# Patient Record
Sex: Male | Born: 1935 | Race: White | Hispanic: No | Marital: Married | Smoking: Former smoker
Health system: Southern US, Community
[De-identification: ages and names within clinical notes are randomized; demographics above are authoritative.]

## PROBLEM LIST (undated history)

## (undated) DIAGNOSIS — I447 Left bundle-branch block, unspecified: Secondary | ICD-10-CM

## (undated) DIAGNOSIS — I679 Cerebrovascular disease, unspecified: Secondary | ICD-10-CM

## (undated) DIAGNOSIS — R55 Syncope and collapse: Secondary | ICD-10-CM

## (undated) DIAGNOSIS — M1990 Unspecified osteoarthritis, unspecified site: Secondary | ICD-10-CM

## (undated) DIAGNOSIS — R0600 Dyspnea, unspecified: Secondary | ICD-10-CM

## (undated) DIAGNOSIS — I6529 Occlusion and stenosis of unspecified carotid artery: Secondary | ICD-10-CM

## (undated) DIAGNOSIS — C61 Malignant neoplasm of prostate: Secondary | ICD-10-CM

## (undated) DIAGNOSIS — I2510 Atherosclerotic heart disease of native coronary artery without angina pectoris: Secondary | ICD-10-CM

## (undated) DIAGNOSIS — I4891 Unspecified atrial fibrillation: Secondary | ICD-10-CM

## (undated) DIAGNOSIS — J8410 Pulmonary fibrosis, unspecified: Secondary | ICD-10-CM

## (undated) DIAGNOSIS — Z950 Presence of cardiac pacemaker: Secondary | ICD-10-CM

## (undated) DIAGNOSIS — N183 Chronic kidney disease, stage 3 unspecified: Secondary | ICD-10-CM

## (undated) DIAGNOSIS — D649 Anemia, unspecified: Secondary | ICD-10-CM

## (undated) DIAGNOSIS — R001 Bradycardia, unspecified: Secondary | ICD-10-CM

## (undated) DIAGNOSIS — I779 Disorder of arteries and arterioles, unspecified: Secondary | ICD-10-CM

## (undated) DIAGNOSIS — E785 Hyperlipidemia, unspecified: Secondary | ICD-10-CM

## (undated) DIAGNOSIS — N189 Chronic kidney disease, unspecified: Secondary | ICD-10-CM

## (undated) HISTORY — DX: Chronic kidney disease, unspecified: N18.9

## (undated) HISTORY — DX: Hyperlipidemia, unspecified: E78.5

## (undated) HISTORY — PX: HERNIA REPAIR: SHX51

## (undated) HISTORY — DX: Unspecified atrial fibrillation: I48.91

## (undated) HISTORY — DX: Malignant neoplasm of prostate: C61

## (undated) HISTORY — DX: Left bundle-branch block, unspecified: I44.7

## (undated) HISTORY — PX: CATARACT EXTRACTION: SUR2

## (undated) HISTORY — DX: Cerebrovascular disease, unspecified: I67.9

## (undated) HISTORY — PX: EYE SURGERY: SHX253

## (undated) HISTORY — DX: Occlusion and stenosis of unspecified carotid artery: I65.29

---

## 2000-03-13 ENCOUNTER — Encounter (INDEPENDENT_AMBULATORY_CARE_PROVIDER_SITE_OTHER): Payer: Self-pay | Admitting: Specialist

## 2000-03-13 ENCOUNTER — Other Ambulatory Visit: Admission: RE | Admit: 2000-03-13 | Discharge: 2000-03-13 | Payer: Self-pay | Admitting: Urology

## 2001-01-04 ENCOUNTER — Encounter: Payer: Self-pay | Admitting: *Deleted

## 2001-01-04 ENCOUNTER — Ambulatory Visit (HOSPITAL_COMMUNITY): Admission: RE | Admit: 2001-01-04 | Discharge: 2001-01-04 | Payer: Self-pay | Admitting: *Deleted

## 2003-01-16 ENCOUNTER — Ambulatory Visit (HOSPITAL_COMMUNITY): Admission: RE | Admit: 2003-01-16 | Discharge: 2003-01-16 | Payer: Self-pay

## 2003-08-05 ENCOUNTER — Ambulatory Visit (HOSPITAL_COMMUNITY): Admission: RE | Admit: 2003-08-05 | Discharge: 2003-08-05 | Payer: Self-pay | Admitting: Internal Medicine

## 2003-12-09 ENCOUNTER — Encounter (HOSPITAL_COMMUNITY): Admission: RE | Admit: 2003-12-09 | Discharge: 2003-12-10 | Payer: Self-pay | Admitting: Internal Medicine

## 2003-12-12 ENCOUNTER — Ambulatory Visit (HOSPITAL_COMMUNITY): Admission: RE | Admit: 2003-12-12 | Discharge: 2003-12-12 | Payer: Self-pay | Admitting: Cardiology

## 2003-12-15 ENCOUNTER — Ambulatory Visit (HOSPITAL_COMMUNITY): Admission: RE | Admit: 2003-12-15 | Discharge: 2003-12-15 | Payer: Self-pay | Admitting: Cardiology

## 2003-12-17 ENCOUNTER — Inpatient Hospital Stay (HOSPITAL_BASED_OUTPATIENT_CLINIC_OR_DEPARTMENT_OTHER): Admission: RE | Admit: 2003-12-17 | Discharge: 2003-12-17 | Payer: Self-pay | Admitting: Cardiology

## 2004-06-16 ENCOUNTER — Ambulatory Visit: Payer: Self-pay | Admitting: Cardiology

## 2004-06-18 ENCOUNTER — Ambulatory Visit (HOSPITAL_COMMUNITY): Admission: RE | Admit: 2004-06-18 | Discharge: 2004-06-18 | Payer: Self-pay | Admitting: Cardiology

## 2004-06-18 ENCOUNTER — Ambulatory Visit: Payer: Self-pay | Admitting: Cardiology

## 2004-09-06 ENCOUNTER — Ambulatory Visit (HOSPITAL_COMMUNITY): Admission: RE | Admit: 2004-09-06 | Discharge: 2004-09-06 | Payer: Self-pay | Admitting: Internal Medicine

## 2004-09-06 ENCOUNTER — Ambulatory Visit: Payer: Self-pay | Admitting: Internal Medicine

## 2004-09-21 ENCOUNTER — Ambulatory Visit: Payer: Self-pay | Admitting: Cardiology

## 2006-01-24 ENCOUNTER — Ambulatory Visit (HOSPITAL_COMMUNITY): Admission: RE | Admit: 2006-01-24 | Discharge: 2006-01-24 | Payer: Self-pay | Admitting: Internal Medicine

## 2006-06-05 ENCOUNTER — Ambulatory Visit (HOSPITAL_COMMUNITY): Admission: RE | Admit: 2006-06-05 | Discharge: 2006-06-05 | Payer: Self-pay | Admitting: Internal Medicine

## 2007-02-22 ENCOUNTER — Ambulatory Visit (HOSPITAL_COMMUNITY): Admission: RE | Admit: 2007-02-22 | Discharge: 2007-02-22 | Payer: Self-pay | Admitting: Ophthalmology

## 2008-05-30 ENCOUNTER — Ambulatory Visit (HOSPITAL_COMMUNITY): Admission: RE | Admit: 2008-05-30 | Discharge: 2008-05-30 | Payer: Self-pay | Admitting: Internal Medicine

## 2008-06-04 ENCOUNTER — Ambulatory Visit: Payer: Self-pay | Admitting: Internal Medicine

## 2008-06-27 HISTORY — PX: COLONOSCOPY W/ POLYPECTOMY: SHX1380

## 2008-07-03 ENCOUNTER — Ambulatory Visit: Payer: Self-pay | Admitting: Internal Medicine

## 2008-07-03 ENCOUNTER — Ambulatory Visit (HOSPITAL_COMMUNITY): Admission: RE | Admit: 2008-07-03 | Discharge: 2008-07-03 | Payer: Self-pay | Admitting: Internal Medicine

## 2008-07-03 ENCOUNTER — Encounter: Payer: Self-pay | Admitting: Internal Medicine

## 2008-10-28 ENCOUNTER — Ambulatory Visit: Payer: Self-pay | Admitting: Cardiology

## 2008-10-29 ENCOUNTER — Ambulatory Visit: Payer: Self-pay | Admitting: Cardiology

## 2008-10-29 ENCOUNTER — Ambulatory Visit (HOSPITAL_COMMUNITY): Admission: RE | Admit: 2008-10-29 | Discharge: 2008-10-29 | Payer: Self-pay | Admitting: Cardiology

## 2008-10-29 ENCOUNTER — Encounter: Payer: Self-pay | Admitting: Cardiology

## 2009-02-26 ENCOUNTER — Encounter (INDEPENDENT_AMBULATORY_CARE_PROVIDER_SITE_OTHER): Payer: Self-pay | Admitting: *Deleted

## 2009-02-26 LAB — CONVERTED CEMR LAB
BUN: 32 mg/dL
Calcium: 9.5 mg/dL
Chloride: 109 meq/L
Glucose, Bld: 90 mg/dL
HCT: 35.1 %
HDL: 48 mg/dL
Hemoglobin, Urine: NEGATIVE
LDL Cholesterol: 144 mg/dL
Nitrite: NEGATIVE
Potassium: 4.7 meq/L
Protein, ur: NEGATIVE mg/dL
Specific Gravity, Urine: 1.018
pH: 6

## 2009-03-05 ENCOUNTER — Encounter (INDEPENDENT_AMBULATORY_CARE_PROVIDER_SITE_OTHER): Payer: Self-pay | Admitting: *Deleted

## 2009-06-02 ENCOUNTER — Ambulatory Visit (HOSPITAL_COMMUNITY): Admission: RE | Admit: 2009-06-02 | Discharge: 2009-06-02 | Payer: Self-pay | Admitting: Internal Medicine

## 2010-05-13 ENCOUNTER — Ambulatory Visit (HOSPITAL_COMMUNITY)
Admission: RE | Admit: 2010-05-13 | Discharge: 2010-05-13 | Payer: Self-pay | Source: Home / Self Care | Admitting: Ophthalmology

## 2010-06-10 ENCOUNTER — Ambulatory Visit (HOSPITAL_COMMUNITY)
Admission: RE | Admit: 2010-06-10 | Discharge: 2010-06-10 | Payer: Self-pay | Source: Home / Self Care | Attending: Internal Medicine | Admitting: Internal Medicine

## 2010-06-29 ENCOUNTER — Ambulatory Visit: Admit: 2010-06-29 | Payer: Self-pay | Admitting: Vascular Surgery

## 2010-06-29 ENCOUNTER — Ambulatory Visit
Admission: RE | Admit: 2010-06-29 | Discharge: 2010-06-29 | Payer: Self-pay | Source: Home / Self Care | Attending: Vascular Surgery | Admitting: Vascular Surgery

## 2010-11-09 NOTE — Consult Note (Signed)
NEW PATIENT CONSULTATION   RHET, RORKE T  DOB:  1927/08/02                                       06/29/2010  ZOXWR#:[id number]   Patient presents today for evaluation of asymptomatic left internal  carotid artery stenosis.  He is an active 77 year old gentleman who has  had serial ultrasounds done in [place] for asymptomatic carotid  disease.  He denies any prior amaurosis fugax, transient ischemic  attack, or stroke.  He does have a long history of smoking history but  quit approximately 40 years ago.  He denies any known coronary artery  disease.  Does have a cardiomyopathy which is treated medically.  He  does also have hyperlipidemia.   SOCIAL HISTORY:  He is married.  He does not smoke.  Does have a history  of alcoholism.   FAMILY HISTORY:  Negative for premature atherosclerotic disease.   REVIEW OF SYSTEMS:  No weight loss or gain.  He weighs 180 pounds.  He  is 6 feet 1 inches tall.  CARDIAC:  Positive for chest tightness and pressure.  NEUROLOGIC:  Negative.  PULMONARY:  COPD, otherwise review of systems is negative except for  HPI.   PHYSICAL EXAMINATION:  A well-developed and well-nourished white male  appearing his stated age in no acute distress.  Blood pressure is  145/74, pulse 56, respirations 16.  He is in no acute distress.  HEENT:  Normal.  CHEST:  Clear bilaterally without rales, rhonchi, or wheezes.  HEART:  Regular rate and rhythm without murmur.  I do not appreciate  carotid bruits in his right or left carotid artery.  His radial pulses  are 2+.  He has 2+ dorsalis pedis pulses.  Musculoskeletal shows no  major deformity or cyanosis.  Neurologic:  No focal weakness or  paresthesias.  Skin without ulcers or rashes.   I reviewed his carotid duplex from [facility].  Also, we repeated his  left carotid duplex today in our office to determine if he was a  candidate for surgery based on duplex alone.  We did see a somewhat less  level of predicted stenosis than was seen at the [facility] study.  The  highest internal carotid artery systolic velocity we were able to obtain  was 226 cm/s versus prediction of 369 at [facility].  Also, his end-  diastolic velocities were elevated slightly at 61 in our study.   I discussed this with patient.  I would not recommend any treatment  based on his asymptomatic moderate to severe stenosis.  I have recommend  that we see him at 43-month intervals with serial ultrasounds to rule out  any progression.  I did explain the symptoms of carotid disease with  him, at which time he will notify us immediately.  Otherwise we will see  him in 6 months for a repeat carotid duplex.     Larina Earthly, M.D.  Electronically Signed   [name]/MEDQ  D:  06/29/2010  T:  06/29/2010  Job:  [id number]   cc:   Kingsley Callander. Ouida Sills, MD

## 2010-11-09 NOTE — Procedures (Signed)
CAROTID DUPLEX EXAM   INDICATION:  Carotid disease, per standing order.   HISTORY:  Diabetes:  No.  Cardiac:  No.  Hypertension:  No.  Smoking:  Previous.  Previous Surgery:  No.  CV History:  Occasional dizziness.  Amaurosis Fugax No, Paresthesias No, Hemiparesis No                                       RIGHT             LEFT  Brachial systolic pressure:         126               128  Brachial Doppler waveforms:         Normal            Normal  Vertebral direction of flow:                          Antegrade  DUPLEX VELOCITIES (cm/sec)  CCA peak systolic                                     75  ECA peak systolic                                     75  ICA peak systolic                                     226  ICA end diastolic                                     61  PLAQUE MORPHOLOGY:                                    Mixed  PLAQUE AMOUNT:                                        Moderate/severe  PLAQUE LOCATION:                                      ICA/ECA   IMPRESSION:  Doppler velocities suggest a 60-79% stenosis of the left  proximal internal carotid artery.   ___________________________________________  Larina Earthly, M.D.   CH/MEDQ  D:  06/30/2010  T:  06/30/2010  Job:  [id number]

## 2010-11-09 NOTE — Op Note (Signed)
NAME:  Max Cole, Max Cole               ACCOUNT NO.:  [id number]   MEDICAL RECORD NO.:  [id number]          PATIENT TYPE:  AMB   LOCATION:  DAY                           FACILITY:  [facility]   PHYSICIAN:  R. Roetta Sessions, M.D. DATE OF BIRTH:  1928-04-10   DATE OF PROCEDURE:  DATE OF DISCHARGE:                               OPERATIVE REPORT   Colonoscopy with snare polypectomy and polyp ablation.   INDICATIONS FOR PROCEDURE:  A 77 year old gentleman with a history of  multiple colonic polyps a colonoscopy previously.  Last colonoscopy was  in 2006.  Colonoscopy is now being done as surveillance maneuver.  Risks, benefits, alternatives, limitations have been reviewed.  Questions answered.  Please see the documentation in the medical record.   PROCEDURE NOTE:  O2 saturation, blood pressure, pulse, respirations were  monitored throughout the entire procedure.   CONSCIOUS SEDATION:  Versed 5 mg IV and Demerol 100 mg IV in divided  doses.   INSTRUMENT:  Pentax video chip system.   FINDINGS:  Digital rectal exam revealed no abnormalities.  Endoscopic  findings:  The prep was adequate.  Colon:  Colonic mucosa was surveyed  from the rectosigmoid junction through the left transverse, right colon,  appendiceal orifice, ileocecal valve, and cecum.  These structures were  well seen and photographed for the record.  The patient had multiple  colonic polyps.  The patient had pancolonic diverticula.  From the level  of the cecum, ileocecal valve, all previously mentioned surfaces were  again seen.  The patient had mid descending colon polyps and hepatic  flexure polyps.  Polyps were upwards of 8 to 10 mm in dimension.  There  was a small pedunculated polyp in the mid descending colon which was  cold snared, slightly larger polyp with a broader based.  Stalk was hot  snared.  Polyps at the hepatic flexure were also removed with snare.  Adjacent diminutive polyps were ablated with the tip of the hot  snare  cautery unit.  Two diminutive cecal polyps were ablated with the tip of  the hot snare cautery unit.  The remainder of colonic mucosa appeared  normal.  Scope was pulled down the rectum.  A thorough examination of  the rectal mucosa including retroflexion of the anal verge demonstrated  no abnormalities aside from anal papillae.  The patient tolerated the  procedure well, was reactive to Endoscopy.   IMPRESSION:  1. Anal papillae, otherwise normal rectum.  2. Pancolonic diverticula.  Multiple colonic polyps in the descending      and hepatic flexure segments.  There are diminutive polyps in the      cecum as well.  These were either hot snare ablated with cold or      hot snare removed.   RECOMMENDATIONS:  1. No aspirin or arthritis medications for the next 5 days.  2. Followup on path.  Diverticulosis and polyp literature provided to      [name].  3. Further recommendations to follow.      [name], M.D.  Electronically Signed     RMR/MEDQ  D:  07/03/2008  T:  07/03/2008  Job:  [id number]

## 2010-11-09 NOTE — H&P (Signed)
Max Cole               ACCOUNT NO.:  [id number]   MEDICAL RECORD NO.:  [id number]          PATIENT TYPE:  AMB   LOCATION:  DAY                           FACILITY:  [facility]   PHYSICIAN:  R. Roetta Sessions, M.D. DATE OF BIRTH:  09-Mar-1928   DATE OF ADMISSION:  DATE OF DISCHARGE:  LH                              HISTORY & PHYSICAL   CHIEF COMPLAINT:  Time for surveillance colonoscopy.  History of colon  polyps.   HISTORY OF PRESENT ILLNESS:  Max Cole is a pleasant 77 year old  gentleman who presents today to schedule his surveillance colonoscopy.  He has a history of multiple adenomatous colonic polyps.  His last  colonoscopy was in March 2006, at which time, he had 6 polyps snared and  the few more coagulated.  Biopsies revealed adenomatous polyps in the  transverse colon.  He had a few scattered diverticula.  Dr. Karilyn Cota  performed a procedure and recommended that he come back in 3 years.  The  patient presents today to continue his care with our facility.  He  requested Dr. Jena Gauss to perform this procedure.  He has been doing well.  He denies any blood in the stool or melena.  Bowel movements are  regular.  No nausea, vomiting, heartburn, dysphagia, odynophagia, or  weight loss.   CURRENT MEDICATIONS:  1. Altace 10 mg daily.  2. Metoprolol 25 mg daily.  3. Aspirin 81 mg daily.  4. Centrum Silver daily.  5. Omega-3 occasionally.   ALLERGIES:  No known drug allergies.   PAST MEDICAL HISTORY:  He has a history of diminished LVEF back in 2005,  when he had echocardiogram and cardiac catheterization.  He states with  decrease in alcohol consumption, his left ventricular ejection fraction  improved up to 40%.  He states that one of his valves is little  thickened.  He denies a history of hypertension, palpitations,  hyperlipidemia, and diabetes mellitus.  He has had a cardiac  catheterization with minimal nonobstructive coronary artery disease.  He  has had bilateral  inguinal hernia repair.   FAMILY HISTORY:  Mother and father both deceased due to old age, age 16  and 33 respectively.  No family history of colorectal cancer.   SOCIAL HISTORY:  He is married.  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  He is a nonsmoker, history of prior use.  He continues to  consume alcohol about twice a week consisting of vodka and wine.   REVIEW OF SYSTEMS:  GI:  As per HPI.  CONSTITUTIONAL:  No weight loss.  CARDIOPULMONARY:  No chest pain, shortness of breath, palpitations or  cough.  GENITOURINARY:  No dysuria or hematuria.   PHYSICAL EXAMINATION:  VITAL SIGNS:  Weight 191, height 6 feet 1 inches,  temp 97.8, blood pressure 130/70, and pulse 64.  GENERAL:  Pleasant, thin, Caucasian gentleman in no acute distress.  SKIN:  Warm and dry.  No jaundice.  HEENT:  Sclerae nonicteric. Oropharyngeal mucosa moist and pink.  No  lesions, erythema, or exudates. No lymphadenopathy or thyromegaly.  CHEST:  Lungs  are clear to auscultation.  CARDIAC:  Regular rate and rhythm.  Normal S1 and S2.  No murmurs, rubs,  or gallops.  ABDOMEN:  Positive bowel sounds.  Abdomen is soft, nontender, and  nondistended.  No organomegaly or masses.  No rebound or guarding.  No  abdominal bruits or hernia.  LOWER EXTREMITIES:  No edema.   IMPRESSION:  The patient is 77 year old gentleman who presents for  further evaluation of history of multiple adenomatous polyps.  He is due  for surveillance colonoscopy.   PLAN:  1. Colonoscopy in near future with Dr. Jena Gauss.  I have discussed risk,      alternatives, and benefits with regards to, but not limited to the      risk, reaction to medication, bleeding, infection, perforation, and      he is agreeable to proceed.  2. Hold aspirin 4 days prior to procedure.  3. Please note the patient requests for minimal sedation as he      generally likes to watch his procedure.      Max Cole, P.AJonathon Bellows, M.D.   Electronically Signed    LL/MEDQ  D:  06/04/2008  T:  06/05/2008  Job:  [id number]   cc:   Kingsley Callander. Ouida Sills, MD  Fax: [phone]

## 2010-11-09 NOTE — Letter (Signed)
Oct 28, 2008    Kingsley Callander. Ouida Sills, MD  [address]  [place], [place] [address]   RE:  Max Cole, Max Cole  MRN:  [id number]  /  DOB:  [birth year]   Dear Channing Mutters,   It was my pleasure evaluating Max Cole in the office today at your  request for history of cardiomyopathy.  I last saw this nice gentleman  in 2006 and then, unfortunately, we lost track of him.  As you know, he  has done very well since that time with good exercise tolerance and  generally good health.  He has known cerebrovascular disease, but a  recent ultrasound showed no progression since December 2005.  He has not  been hospitalized nor required treatment in the emergency department.  He underwent an elective right cataract excision without difficulty.  He  has not had an echocardiogram performed since 2005.  He has had chronic  exertional substernal pressure with no significant coronary artery  disease at catheterization in June 2005.  He is able to exercise pretty  much without limit in the morning, but develops discomfort in the  evenings with moderate exertion.  He has known chronic left bundle  branch block.  He has continued to drink some alcohol, but moderated his  intake substantially when his cardiomyopathy was discovered.  His  ejection fraction was initially 0.25 by echo, but increased to 0.40 six  months later.   CURRENT MEDICATIONS:  Aspirin 81 mg daily, Ramipril 10 mg daily.  He  also takes metoprolol 25 mg p.r.n. when he experiences tachycardia.  He  was not able to take beta-blockers continuously in the past due to  bradycardia.   He has no known allergies.  Other aspects of his past medical, social,  and family history were explored and are unchanged.   PHYSICAL EXAMINATION:  GENERAL:  Tall, proportionate, healthy-appearing  gentleman in no acute distress.  The weight is 186, unchanged from 2005.  VITAL SIGNS:  Blood pressure 130/65, heart rate 65 and regular,  respirations 14 and unlabored.  NECK:   No jugular venous distention; faint bilateral bruits.  LUNGS:  Clear.  CARDIAC:  Normal first and second heart sounds; grade 1-2/6 basilar  systolic ejection murmur.  ABDOMEN:  Soft and nontender; no bruits; no organomegaly.  EXTREMITIES:  No edema; distal pulses intact.  SKIN:  No significant abnormalities.  PSYCHIATRIC:  Alert and oriented.  NEUROLOGIC:  Symmetric strength and tone; normal cranial nerves.   Records were obtained from your office and reviewed.  EKG in August  2009, continued to show left bundle-branch block and sinus rhythm; a  single PAC was recorded.  The chemistry profile shows mild renal  insufficiency with a creatinine of 1.6.   IMPRESSION:  Max Cole is doing superbly.  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  Hopefully, his left ventricle is now perfectly normal.  An echocardiogram will be obtained in an attempt to document that.  Even  if ejection fraction is now normal with treatment, it could be subnormal  where we just stop ramipril.  I would be inclined to continue that  medication indefinitely.  It also may be of some benefit to forestall  progression of renal insufficiency.   Much as you have done in the past, I discussed the benefits of statins  with him.  He is inclined to use diet instead.  He was provided with  information regarding heart healthy  diet.  At age 77, with only minor  coronary disease in the past and nonprogressive cerebrovascular disease,  I do not feel strongly that he needs pharmacologic therapy.   He has erectile dysfunction.  Viagra has been of benefit to him in the  past, but Cialis has not.  We have no samples of the former, but offered  him a prescription.   I will review his echocardiogram.  If LV systolic function is relatively  good, he does not require additional cardiology followup.  If not, I  will plan to see him again to determine whether  additional therapy is  warranted.   Thank you so much for sending this nice gentleman back to see me.    Sincerely,      Gerrit Friends. Dietrich Pates, MD, [facility]  Electronically Signed    RMR/MedQ  DD: 10/28/2008  DT: 10/29/2008  Job #: [id number]

## 2010-11-12 NOTE — Cardiovascular Report (Signed)
NAME:  Max Cole, Max Cole                         ACCOUNT NO.:  [id number]   MEDICAL RECORD NO.:  [id number]                   PATIENT TYPE:  OIB   LOCATION:  6501                                 FACILITY:  [facility]   PHYSICIAN:  Charlies Constable, M.D. [facility]              DATE OF BIRTH:  Jul 21, 1927   DATE OF PROCEDURE:  12/17/2003  DATE OF DISCHARGE:  12/17/2003                              CARDIAC CATHETERIZATION   CLINICAL HISTORY:  [name] is 77 years old and has been quite active  exercising regularly and playing golf regularly.  Recently, he had noticed  some chest heaviness with exertion and he was evaluated with Cardiolite scan  which showed left ventricular dilatation, ejection fraction 48% but no  evidence of ischemia.  He also had an echocardiogram which showed an  estimated ejection fraction of 25%.  He was scheduled for further evaluation  with angiography.   PROCEDURE:  Left heart catheterization was performed percutaneously via the  right femoral artery using arterial sheath and 6 French preformed coronary  catheters. Right heart catheterization was performed percutaneously via the  right femoral vein using medium sheath and Swan-Ganz thermodilution  catheter.  The patient tolerated the procedure well and left the lab in  satisfactory condition.  5 French catheters were used.   RESULTS:  Left main coronary artery:  The left main coronary was free of  significant disease.   Left anterior descending artery:  The left anterior descending artery gave  rise to three diagonal branches and two septal perforators.  There is 50%  ostial stenosis in the first diagonal branch.  The rest of the LAD system  was free of significant disease.  obstruction.   Circumflex artery:  The circumflex artery was a large dominant vessel that  gave rise to three marginal branches, two posterior lateral branches and  posterior descending branch.  These vessels were free of significant  disease.   Right coronary artery:  The right coronary artery was a nondominant vessel  that supplied two right ventricular branches.  These vessels were free of  significant disease.   LEFT VENTRICULOGRAM:  The left ventriculogram performed in the RAO  projection showed global hypokinesis with an estimated ejection fraction of  35-40%.   HEMODYNAMIC DATA:  The right atrial pressure was 6 mean.  The right  ventricular pressure was 27/9.  Pulmonary artery pressure was 27/7 with a  mean of 16.  The pulmonary wedge pressure was 12 mean.  The aortic pressure  was 118/53 with mean of 77.  Left ventricular pressure was 118/12.   CONCLUSIONS:  1. Minimal nonobstructive coronary artery disease with 50% narrowing in the     ostium of the first diagonal branch of the left anterior descending.  2. Left ventricular dysfunction with global hypokinesis with an estimated     ejection fraction of 35-40%.   RECOMMENDATIONS:  The patient appears to have a nonischemic  cardiomyopathy.  The etiology is not clear.  He does have a history of some alcohol and this  is a possibility.  Will caution him against this and continue  treatment with Altace and arrange followup with Dr. Dietrich Pates.  He has  bradycardia so he is probably not a candidate for beta blocker therapy.  His  ECG has left bundle branch block and his symptoms do not respond to medical  treatment he may be a candidate for a bi-V pacer.                                               Charlies Constable, M.D. [facility]    BB/MEDQ  D:  12/17/2003  T:  12/18/2003  Job:  [id number]   cc:   Kingsley Callander. Ouida Sills, M.D.  [address]  [place]  [place] [address]  Fax: [phone]   Herbst Bing, M.D.

## 2010-11-12 NOTE — Procedures (Signed)
NAME:  Max Cole, Max Cole                         ACCOUNT NO.:  [id number]   MEDICAL RECORD NO.:  [id number]                   PATIENT TYPE:  OUT   LOCATION:  RAD                                  FACILITY:  [facility]   PHYSICIAN:  Enfield Bing, M.D.               DATE OF BIRTH:  January 14, 1928   DATE OF PROCEDURE:  12/12/2003  DATE OF DISCHARGE:                                  ECHOCARDIOGRAM   REFERRING PHYSICIAN:  Kingsley Callander. Ouida Sills, M.D. and Monette Bing, M.D.   CLINICAL DATA:  A 76 year old gentleman with chest pain.   M-MODE:  Aorta 3.6, left atrium 3.8, septum 1.7, posterior wall 1.1, LV  diastole 6.1, LV systole 5.4.   1. Technically adequate echocardiographic study.  2. Mild left atrial enlargement; normal right atrium and right ventricle.  3. Mild aortic valve sclerosis; very mild insufficiency; mild annular     calcification.  4. Slight mitral valve thickening; mild annular calcification; mild     regurgitation.  5. Normal tricuspid and pulmonic valve.  6. Mild to moderate left ventricular dilatation; mild hypertrophy, most     notable in the proximal septum.  There is global hypokinesis with     paradoxic septal motion.  Overall LV systolic function severely impaired;     estimated ejection fraction equals 0.25.  7. Normal IVC.      ___________________________________________                                             Bing, M.D.   RR/MEDQ  D:  12/12/2003  T:  12/13/2003  Job:  [id number]   cc:   Kingsley Callander. Ouida Sills, M.D.  [address]  [place]  [place] [address]  Fax: [phone]

## 2010-11-12 NOTE — Op Note (Signed)
NAME:  Max Cole, Max Cole                         ACCOUNT NO.:  [id number]   MEDICAL RECORD NO.:  [id number]                   PATIENT TYPE:  AMB   LOCATION:  DAY                                  FACILITY:  [facility]   PHYSICIAN:  Lionel December, M.D.                 DATE OF BIRTH:  1928-01-11   DATE OF PROCEDURE:  08/05/2003  DATE OF DISCHARGE:                                 OPERATIVE REPORT   PROCEDURE:  Total colonoscopy with polypectomy.   INDICATIONS FOR PROCEDURE:  Max Cole is a 77 year old Caucasian male who is here  for screening colonoscopy.  Family history is negative for colorectal  carcinoma.  The procedure and risks were reviewed with the patient, and  informed consent was obtained.   PREOPERATIVE MEDICATIONS:  Demerol 50 mg IV, Versed 5 mg IV in divided dose.   FINDINGS:  The procedure was performed in the endoscopy suite.  The  patient's vital signs and O2 saturations were monitored during the procedure  and remained stable.  The patient was placed in the left lateral recumbent  position and rectal examination performed.  No abnormality noted on external  or digital exam.  The Olympus videoscope was placed into the rectum and  advanced into the region of the sigmoid colon and beyond.  There was a very  redundant hepatic flexure, but I was able to pass the scope into the cecum  which was identified by the appendiceal orifice and ileocecal valve.  There  were two small polyps at the cecum which were ablated by cold biopsy.  There  were three polyps in the area of the hepatic flexure.  One was snared, and  the two others were also snared after raising them with a submucosal  injection of normal saline.  The larger of the three was over a centimeter  in size and was sessile.  Three small polyps at transverse colon were  coagulated.  Three more small polyps were snared.  Two were small and flat  and raised with a submucosal injection of normal saline.  There was a flat  polyp over 2  cm in size at the proximal sigmoid colon which was snared in  two pieces after raising it with a submucosal injection of normal saline.  There were a few tiny polyps that were not treated.  These were in the  transverse and descending colon.  The mucosa of the rest of the colon was  normal.  The rectal mucosa similarly was normal.  Please note that two large  fragments of this polyp were caught with a basket and were removed.  The  scope was passed again and the rectum reexamined along with the anorectal  junction which was unremarkable.  The endoscope was straightened and  withdrawn.  The patient tolerated the procedure well.   FINAL DIAGNOSES:  1. Multiple colonic polyps.  The largest of these polyps were over 2  cm in     the proximal sigmoid colon which were snared piecemeal after submucosal     injection of normal saline.  2. Six other polyps were snared.  Three were coagulated and two were ablated     by cold biopsy, as described above.   RECOMMENDATIONS:  1. Standard instructions given.  2. I will be contacting the patient with the biopsy results.  He probably     will need another colonoscopy in 6 to 12 months.      ___________________________________________                                            Lionel December, M.D.   NR/MEDQ  D:  08/05/2003  T:  08/05/2003  Job:  [id number]   cc:   Kingsley Callander. Ouida Sills, M.D.  [address]  [place]  [place] [address]  Fax: [phone]

## 2010-11-12 NOTE — Procedures (Signed)
NAME:  EVERETT, EHRLER NO.:  [id number]   MEDICAL RECORD NO.:  [id number]          PATIENT TYPE:  OUT   LOCATION:  RAD                           FACILITY:  [facility]   PHYSICIAN:  Vida Roller, M.D.   DATE OF BIRTH:  12/20/1927   DATE OF PROCEDURE:  DATE OF DISCHARGE:  06/18/2004                                  ECHOCARDIOGRAM   PRIMARY CARE PHYSICIAN:  Kingsley Callander. Ouida Sills, M.D.   TAPE NUMBER:  [id number].   TAPE COUNT:  Is [id number] through [id number].   This is a gentleman with carotid bruits for evaluation.  A previous echo  done, in June 2005, shows depressed LV systolic function at 25% with left  atrial enlargement and mild aortic insufficiency.   TECHNICAL QUALITY:  Today's study was technically adequate.   M-MODE TRACING:  1.  The aorta is 33-mm.  2.  The left atrium is 40-mm.  3.  The septum is 12-mm.  4.  The posterior wall is 12-mm.  5.  Left ventricular diastolic dimension is 54-mm.  6.  Left ventricular systolic dimension is 43-mm.   IMAGING: TWO-DIMENSIONAL AND DOPPLER:  1.  The left ventricle is normal size with mild LVH which is concentric.      There is depressed LV systolic function estimated at 40-45%.  There is      anterior hypokinesis with septal motion consistent with a bundle branch      block.   1.  The right ventricle is normal size with normal systolic function.   1.  Both atria appear to be top normal in size.   1.  The aortic valve is mildly sclerotic with mild aortic insufficiency.  No      stenosis is seen.   1.  The mitral valve is mildly thickened with mild insufficiency.  No      stenosis is seen.   1.  The tricuspid valve is mildly thickened with mild regurgitation.  No      stenosis is seen.   1.  The pulmonic valve is not well seen.   1.  Ascending aorta is not well seen.   1.  There is no pericardial effusion.   1.  The inferior vena cava was not well seen.     Trey Paula   JH/MEDQ  D:  06/22/2004  T:  06/22/2004  Job:  [id number]

## 2010-11-12 NOTE — Op Note (Signed)
NAMEAJMAL, KATHAN               ACCOUNT NO.:  [id number]   MEDICAL RECORD NO.:  [id number]          PATIENT TYPE:  AMB   LOCATION:  DAY                           FACILITY:  [facility]   PHYSICIAN:  Lionel December, M.D.    DATE OF BIRTH:  Oct 13, 1927   DATE OF PROCEDURE:  09/06/2004  DATE OF DISCHARGE:                                 OPERATIVE REPORT   PROCEDURE:  Total colonoscopy with polypectomy.   INDICATION:  Max Cole is a 77 year old Caucasian male who underwent screening  colonoscopy in February 2005 and had multiple polyps removed and/or  coagulated.  It was therefore decided to bring him in at one year rather  than three or five.  He remains free of symptoms.  The procedure risks were  reviewed the patient, and informed consent was obtained.   PREMEDICATION:  Versed 2 mg IV in divided dose.   FINDINGS:  Procedure performed in endoscopy suite.  The patient's vital  signs and O2 saturation were monitored during procedure and remained stable.  The patient was placed in the left lateral recumbent position and rectal  examination performed.  No abnormality noted on external or digital exam.  The Olympus video scope was placed in the rectum and advanced under vision  into sigmoid colon and beyond.  Preparation was satisfactory although he had  a lot of liquid stool, which had to be suctioned out.  Few small scattered  diverticula were noted throughout the colon.  The scope was passed to the  cecum, which was identified by ileocecal valve and appendiceal orifice.  Pictures taken for the record.  As the scope was withdrawn, the colonic  mucosa was carefully examined and multiple polyps were found and treated as  below.  There was a sessile polyp at midtransverse colon, which was after  raising a submucosal injection of normal saline.  There was another small  polyp at splenic flexure, which was coagulated while it was being snared.  Five small polyps were snared from the sigmoid colon and  least two more were  coagulated.  The rectal mucosa was normal.  The scope was retroflexed to  examine anorectal junction, which was unremarkable.  Endoscope was  straightened and withdrawn.  The patient tolerated the procedure well.   FINAL DIAGNOSES:  1.  Multiple colonic polyps.  Six were snared as above and a few more were      coagulated.  2.  Few scattered diverticula throughout the colon.   RECOMMENDATIONS:  1.  Standard instructions given.  2.  I will be contacting the patient with biopsy results. Given today's      findings, will plan to bring him back for repeat colonoscopy in three      years from now.      NR/MEDQ  D:  09/06/2004  T:  09/06/2004  Job:  [id number]   cc:   Kingsley Callander. Ouida Sills, MD  [address]  [place]  [place] [address]  Fax: [phone]

## 2010-11-12 NOTE — Procedures (Signed)
NAMEMarland Kitchen  CALLUM, WOLF NO.:  [id number]   MEDICAL RECORD NO.:  [id number]                   PATIENT TYPE:   LOCATION:                                       FACILITY:   PHYSICIAN:  Kingsley Callander. Ouida Sills, M.D.                  DATE OF BIRTH:   DATE OF PROCEDURE:  12/09/2003  DATE OF DISCHARGE:                                    STRESS TEST   SUMMARY:  Mr. Carll underwent a Cardiolite stress test to evaluate recent  symptoms of exertional chest tightness.  He exercised 9 minutes (completing  stage 3 of the Bruce protocol) attaining a maximal heart rate of 158 (107%  of the age-predicted maximal heart rate) at a work load of 10.1 METs and  discontinued exercise due to chest tightness.  He began experiencing chest  tightness during stage 1.  His tightness resolved during recovery.  There  were infrequent atrial premature complexes. His baseline EKG revealed normal  sinus rhythm with a left bundle-branch block.  With exercise he developed 3  mm downsloping ST segment depression with marked T wave inversions in the  inferolateral leads.  There was a peak blood pressure response of 202/80.   IMPRESSION:  Nondiagnostic exercise stress test due to an underlying left  bundle-branch block.  There were definite ST segment and T wave changes,  though, with exercise.  Cardiolite images are pending.      ___________________________________________                                            Kingsley Callander. Ouida Sills, M.D.   ROF/MEDQ  D:  12/09/2003  T:  12/09/2003  Job:  [id number]

## 2010-12-30 ENCOUNTER — Other Ambulatory Visit (INDEPENDENT_AMBULATORY_CARE_PROVIDER_SITE_OTHER): Payer: Medicare Other

## 2010-12-30 DIAGNOSIS — I6529 Occlusion and stenosis of unspecified carotid artery: Secondary | ICD-10-CM

## 2011-01-12 NOTE — Procedures (Unsigned)
CAROTID DUPLEX EXAM  INDICATION:  Follow up known carotid disease.  HISTORY: Diabetes:  No. Cardiac:  No. Hypertension:  No. Smoking:  Previous. Previous Surgery:  No. CV History: Amaurosis Fugax No, Paresthesias No, Hemiparesis No.                                      RIGHT             LEFT Brachial systolic pressure:         114               122 Brachial Doppler waveforms:         WNL               WNL Vertebral direction of flow:        Antegrade         Antegrade DUPLEX VELOCITIES (cm/sec) CCA peak systolic                   83                92 ECA peak systolic                   78                82 ICA peak systolic                   80                274 ICA end diastolic                   24                86 PLAQUE MORPHOLOGY:                  Calcific          Calcific PLAQUE AMOUNT:                      Mild              Moderate-to-severe PLAQUE LOCATION:                    ICA               ICA  IMPRESSION: 1. 60% to 79% left internal carotid artery stenosis. 2. 1% to 39% right internal carotid artery stenosis. 3. Stable left internal carotid artery compared to previous     examination. 4. Bilateral vertebral arteries are within normal limits.  ___________________________________________ Larina Earthly, M.D.  LT/MEDQ  D:  12/30/2010  T:  12/30/2010  Job:  [id number]

## 2011-04-08 LAB — BASIC METABOLIC PANEL
CO2: 34 — ABNORMAL HIGH
GFR calc Af Amer: >60
GFR calc non Af Amer: 56 — ABNORMAL LOW
Glucose, Bld: 85
Potassium: 5.1
Sodium: 139

## 2011-04-08 LAB — HEMOGLOBIN AND HEMATOCRIT, BLOOD: HCT: 36.1 — ABNORMAL LOW

## 2011-06-14 ENCOUNTER — Encounter: Payer: Self-pay | Admitting: Internal Medicine

## 2011-07-15 ENCOUNTER — Other Ambulatory Visit: Payer: Medicare Other

## 2011-09-02 ENCOUNTER — Other Ambulatory Visit (INDEPENDENT_AMBULATORY_CARE_PROVIDER_SITE_OTHER): Payer: Medicare Other | Admitting: *Deleted

## 2011-09-02 DIAGNOSIS — I6529 Occlusion and stenosis of unspecified carotid artery: Secondary | ICD-10-CM

## 2011-09-12 ENCOUNTER — Other Ambulatory Visit: Payer: Self-pay | Admitting: *Deleted

## 2011-09-12 MED ORDER — RAMIPRIL 10 MG PO TABS: 10.0000 mg | ORAL_TABLET | Freq: Every day | ORAL | Status: DC

## 2011-09-20 ENCOUNTER — Other Ambulatory Visit: Payer: Self-pay | Admitting: *Deleted

## 2011-09-20 DIAGNOSIS — I6529 Occlusion and stenosis of unspecified carotid artery: Secondary | ICD-10-CM

## 2011-09-21 ENCOUNTER — Encounter: Payer: Self-pay | Admitting: Vascular Surgery

## 2011-09-21 NOTE — Procedures (Unsigned)
CAROTID DUPLEX EXAM  INDICATION:  Carotid disease.  HISTORY: Diabetes:  No. Cardiac:  No. Hypertension:  No. Smoking:  Previous. Previous Surgery:  No.  CV History:  Currently asymptomatic Amaurosis Fugax No, Paresthesias No, Hemiparesis No                                      RIGHT             LEFT Brachial systolic pressure:         116               122 Brachial Doppler waveforms:         Normal            Normal Vertebral direction of flow:        Antegrade         Antegrade DUPLEX VELOCITIES (cm/sec) CCA peak systolic                   101               89 ECA peak systolic                   115               110 ICA peak systolic                   75                267 ICA end diastolic                   23                79 PLAQUE MORPHOLOGY:                  Mixed             Mixed PLAQUE AMOUNT:                      Mild              Moderate/severe PLAQUE LOCATION:                    ICA/ECA           ICA/ECA  IMPRESSION:  Doppler velocity suggests 1 to 39% stenosis of the right internal carotid artery and a high end 60% to 79% stenosis of the left proximal internal carotid artery.  No significant change noted when compared to the previous exam on 12/30/2010.  ___________________________________________ Larina Earthly, M.D.  CH/MEDQ  D:  09/06/2011  T:  09/06/2011  Job:  [id number]

## 2011-09-29 ENCOUNTER — Encounter: Payer: Self-pay | Admitting: Cardiology

## 2011-09-29 ENCOUNTER — Ambulatory Visit: Payer: Medicare Other | Admitting: Cardiology

## 2011-09-29 DIAGNOSIS — N183 Chronic kidney disease, stage 3 (moderate): Secondary | ICD-10-CM

## 2011-09-29 DIAGNOSIS — I447 Left bundle-branch block, unspecified: Secondary | ICD-10-CM | POA: Insufficient documentation

## 2011-09-29 DIAGNOSIS — I679 Cerebrovascular disease, unspecified: Secondary | ICD-10-CM | POA: Insufficient documentation

## 2011-09-29 DIAGNOSIS — I428 Other cardiomyopathies: Secondary | ICD-10-CM | POA: Insufficient documentation

## 2011-10-14 ENCOUNTER — Ambulatory Visit (INDEPENDENT_AMBULATORY_CARE_PROVIDER_SITE_OTHER): Payer: Medicare Other | Admitting: Cardiology

## 2011-10-14 ENCOUNTER — Encounter: Payer: Self-pay | Admitting: *Deleted

## 2011-10-14 ENCOUNTER — Encounter: Payer: Self-pay | Admitting: Cardiology

## 2011-10-14 VITALS — BP 127/69 | HR 61 | Resp 16 | Ht 73.0 in | Wt 183.0 lb

## 2011-10-14 DIAGNOSIS — I2510 Atherosclerotic heart disease of native coronary artery without angina pectoris: Secondary | ICD-10-CM

## 2011-10-14 DIAGNOSIS — I679 Cerebrovascular disease, unspecified: Secondary | ICD-10-CM

## 2011-10-14 DIAGNOSIS — Z0189 Encounter for other specified special examinations: Secondary | ICD-10-CM

## 2011-10-14 DIAGNOSIS — I428 Other cardiomyopathies: Secondary | ICD-10-CM

## 2011-10-14 DIAGNOSIS — E785 Hyperlipidemia, unspecified: Secondary | ICD-10-CM

## 2011-10-14 DIAGNOSIS — N189 Chronic kidney disease, unspecified: Secondary | ICD-10-CM

## 2011-10-14 MED ORDER — PRAVASTATIN SODIUM 40 MG PO TABS: 40.0000 mg | ORAL_TABLET | Freq: Every day | ORAL | Status: DC

## 2011-10-14 NOTE — Progress Notes (Signed)
Name: Max Cole    DOB: 1928-05-16  Age: 78 y.o.  MR#: [id number]       PCP:  Carylon Perches, MD, MD      Insurance: @PAYORNAME @   CC:    Chief Complaint  Patient presents with  . Appointment    no complaints    VS BP 127/69  Pulse 61  Resp 16  Ht 6\' 1"  (1.854 m)  Wt 183 lb (83.008 kg)  BMI 24.14 kg/m2  Weights Current Weight  10/14/11 183 lb (83.008 kg)    Blood Pressure  BP Readings from Last 3 Encounters:  10/14/11 127/69     Admit date:  (Not on file) Last encounter with RMR:  09/29/2011   Allergy No Known Allergies  Current Outpatient Prescriptions  Medication Sig Dispense Refill  . aspirin 81 MG tablet Take 81 mg by mouth daily.      . metoprolol succinate (TOPROL-XL) 50 MG 24 hr tablet Take 25 mg by mouth daily. Take with or immediately following a meal.      . Omega-3 Fatty Acids (FISH OIL PO) Take by mouth.      . ramipril (ALTACE) 10 MG tablet Take 1 tablet (10 mg total) by mouth daily.  90 tablet  3    Discontinued Meds:   There are no discontinued medications.  Patient Active Problem List  Diagnoses  . Cerebrovascular disease  . Left bundle branch block  . Cardiomyopathy  . Chronic kidney disease    LABS No visits with results within 3 Month(s) from this visit. Latest known visit with results is:  [facility] Conversion Encounter on 02/26/2009  Component Date Value  . WBC 02/26/2009 6.6   . Hemoglobin 02/26/2009 11.5   . HCT 02/26/2009 35.1   . MCV 02/26/2009 96.4   . Platelets 02/26/2009 300   . Cholesterol 02/26/2009 205   . Triglycerides 02/26/2009 67   . HDL 02/26/2009 48   . LDL Cholesterol 02/26/2009 144   . Sodium 02/26/2009 143   . Potassium 02/26/2009 4.7   . Chloride 02/26/2009 109   . CO2 02/26/2009 24   . Glucose, Bld 02/26/2009 90   . BUN 02/26/2009 32   . Creatinine, Ser 02/26/2009 1.83   . Alkaline Phosphatase 02/26/2009 46   . AST 02/26/2009 16   . ALT 02/26/2009 12   . Total Protein 02/26/2009 6.9   . Albumin 02/26/2009  4.1   . Calcium 02/26/2009 9.5   . Specific Gravity, Urine 02/26/2009 1.018   . pH 02/26/2009 6.0   . Urine Glucose 02/26/2009 neg   . Hemoglobin, Urine 02/26/2009 neg   . Protein, ur 02/26/2009 neg   . Nitrite 02/26/2009 neg   . WBC number, urine, micro* 02/26/2009 none seen   . RBC / HPF 02/26/2009 nine seen   . Bacteria, UA 02/26/2009 none seen      Results for this Opt Visit:     Results for orders placed in visit on 02/26/09  CONVERTED [facility] LAB      Component Value Range   WBC 6.6     Hemoglobin 11.5     HCT 35.1     MCV 96.4     Platelets 300     Cholesterol 205     Triglycerides 67     HDL 48     LDL Cholesterol 144     Sodium 143     Potassium 4.7     Chloride 109     CO2 24  Glucose, Bld 90     BUN 32     Creatinine, Ser 1.83     Alkaline Phosphatase 46     AST 16     ALT 12     Total Protein 6.9     Albumin 4.1     Calcium 9.5     Specific Gravity, Urine 1.018     pH 6.0     Urine Glucose neg     Hemoglobin, Urine neg     Protein, ur neg     Nitrite neg     WBC number, urine, microscopy none seen     RBC / HPF nine seen     Bacteria, UA none seen      EKG No orders found for this or any previous visit.   Prior Assessment and Plan Problem List as of 10/14/2011          Cardiology Problems   Cerebrovascular disease   Left bundle branch block   Cardiomyopathy     Other   Chronic kidney disease       Imaging: No results found.   FRS Calculation: Score not calculated

## 2011-10-14 NOTE — Patient Instructions (Signed)
Your physician recommends that you schedule a follow-up appointment in: 1 YEAR  Your physician recommends that you return for lab work in: CHOLESTEROL IN 6 MONTHS  Your physician has recommended you make the following change in your medication:  1 - START PRAVACHOL 40 MG DAILY

## 2011-10-15 ENCOUNTER — Encounter: Payer: Self-pay | Admitting: Cardiology

## 2011-10-15 DIAGNOSIS — E785 Hyperlipidemia, unspecified: Secondary | ICD-10-CM

## 2011-10-15 DIAGNOSIS — Z0189 Encounter for other specified special examinations: Secondary | ICD-10-CM | POA: Insufficient documentation

## 2011-10-15 HISTORY — DX: Hyperlipidemia, unspecified: E78.5

## 2011-10-15 NOTE — Assessment & Plan Note (Signed)
Moderate to severe cerebrovascular disease with a moderate to severe left internal carotid artery stenosis.  In the presence of known vascular disease, treatment of mild to moderate hyperlipidemia would be appropriate.

## 2011-10-15 NOTE — Assessment & Plan Note (Signed)
Patient continues to do extremely well despite long-standing severe cardiomyopathy.  ACE Inhibitor therapy has been maintained.  Treatment with beta blocker has been stopped due to bradycardia.  Aldactone was not required in the absence of symptoms.

## 2011-10-15 NOTE — Assessment & Plan Note (Signed)
Moderate hyperlipidemia has been present for some time, but patient has refused treatment with statins.  I. Once again raised the issue with him, and he now agrees to accept that therapy.  Pravastatin will be started at a dose of 40 mg per day with a repeat lipid profile in 2 months.

## 2011-10-15 NOTE — Assessment & Plan Note (Signed)
Renal dysfunction has been progressive over the past 3 years.  Continued monitoring is appropriate, and referral to a nephrologist should be considered.

## 2011-10-15 NOTE — Progress Notes (Signed)
Patient ID: Max Cole, male   DOB: February 19, 1928, 78 y.o.   MRN: [id number]  HPI: Patient returns to the office after having been lost to followup for the past 3 years.  Interval records obtained and reviewed.  He has a history of cardiomyopathy, perhaps related to excessive alcohol use, but has been essentially free of symptoms.  Ejection fraction was last measured at 25% in 2010.  He remains active including exercising at a local gym and denies all cardiopulmonary symptoms.  He is able to walk a few miles without difficulty.  He is followed by [facility] for moderate to severe cerebrovascular disease.  He continues to consume approximately 5-10 ounces of alcohol per week.  He has not been hospitalized nor required urgent medical care within the past few years.  Prior to Admission medications   Medication Sig Start Date End Date Taking? Authorizing Provider  aspirin 81 MG tablet Take 81 mg by mouth daily.   Yes Historical Provider, MD  metoprolol succinate (TOPROL-XL) 50 MG 24 hr tablet Take 25 mg by mouth daily. Take with or immediately following a meal.   Yes Historical Provider, MD  Omega-3 Fatty Acids (FISH OIL PO) Take by mouth.   Yes Historical Provider, MD  ramipril (ALTACE) 10 MG tablet Take 1 tablet (10 mg total) by mouth daily. 09/12/11 09/11/12 Yes Kathlen Brunswick, MD  pravastatin (PRAVACHOL) 40 MG tablet Take 1 tablet (40 mg total) by mouth daily. 10/14/11 10/13/12  Kathlen Brunswick, MD   No Known Allergies    Past medical history, social history, and family history reviewed and updated.  ROS: Denies orthopnea, PND, chest discomfort, exertional dyspnea, palpitations, lightheadedness or syncope.  All other systems reviewed and are negative.  PHYSICAL EXAM: BP 127/69  Pulse 61  Resp 16  Ht 6\' 1"  (1.854 m)  Wt 83.008 kg (183 lb)  BMI 24.14 kg/m2   General-Well developed; no acute distress Body habitus-proportionate weight and height Neck-No JVD; left carotid bruit Lungs-clear lung  fields; resonant to percussion Cardiovascular-normal PMI; normal S1; paradoxic splitting of S2; modest basilar systolic ejection murmur Abdomen-normal bowel sounds; soft and non-tender without masses or organomegaly Musculoskeletal-No deformities, no cyanosis or clubbing Neurologic-Normal cranial nerves; symmetric strength and tone Skin-Warm, no significant lesions Extremities-distal pulses intact; no edema  EKG: Sinus bradycardia at a rate of 59 bpm; left bundle branch block.  No previous tracing.  ASSESSMENT AND PLAN:  [place] Bing, MD 10/15/2011 4:12 PM

## 2011-12-14 ENCOUNTER — Other Ambulatory Visit: Payer: Self-pay | Admitting: *Deleted

## 2011-12-14 MED ORDER — RAMIPRIL 10 MG PO TABS: 10.0000 mg | ORAL_TABLET | Freq: Every day | ORAL | Status: DC

## 2012-03-01 ENCOUNTER — Encounter: Payer: Self-pay | Admitting: Neurosurgery

## 2012-03-02 ENCOUNTER — Encounter: Payer: Self-pay | Admitting: Neurosurgery

## 2012-03-02 ENCOUNTER — Ambulatory Visit (INDEPENDENT_AMBULATORY_CARE_PROVIDER_SITE_OTHER): Payer: Medicare Other | Admitting: Neurosurgery

## 2012-03-02 ENCOUNTER — Other Ambulatory Visit (INDEPENDENT_AMBULATORY_CARE_PROVIDER_SITE_OTHER): Payer: Medicare Other | Admitting: *Deleted

## 2012-03-02 VITALS — BP 115/58 | HR 55 | Resp 14 | Ht 73.0 in | Wt 180.3 lb

## 2012-03-02 DIAGNOSIS — I6529 Occlusion and stenosis of unspecified carotid artery: Secondary | ICD-10-CM

## 2012-03-02 NOTE — Addendum Note (Signed)
Addended by: Sharee Pimple on: 03/02/2012 02:43 PM   Modules accepted: Orders

## 2012-03-02 NOTE — Progress Notes (Signed)
[facility] Carotid Office Note  CC: Six-month carotid surveillance Referring Physician: Early  History of Present Illness: 78 year old male patient of Dr. Arbie Cookey followed for known bilateral carotid stenosis. The patient denies any signs or symptoms of CVA, TIA, amaurosis fugax or any neural deficit. The patient denies any new medical diagnoses recent surgery.  Past Medical History  Diagnosis Date  . Cerebrovascular disease     Carotid ultrasound in 12/2010-60-79% left internal carotid artery, less than 40% or right RICA, no change  . Left bundle branch block   . 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  EF of 0.25 in 11/2003, 40-45% in 05/2004 and 25% in 10/2008 by echo  . Chronic kidney disease     Creatinine of 1.6 in 2009  . Hyperlipidemia 10/15/2011    Lipid profile in 03/2009:231, 136, 56, 148    ROS: [x]  Positive   [ ]  Denies    General: [ ]  Weight loss, [ ]  Fever, [ ]  chills Neurologic: [ ]  Dizziness, [ ]  Blackouts, [ ]  Seizure [ ]  Stroke, [ ]  "Mini stroke", [ ]  Slurred speech, [ ]  Temporary blindness; [ ]  weakness in arms or legs, [ ]  Hoarseness Cardiac: [ ]  Chest pain/pressure, [ ]  Shortness of breath at rest [ ]  Shortness of breath with exertion, [ ]  Atrial fibrillation or irregular heartbeat Vascular: [ ]  Pain in legs with walking, [ ]  Pain in legs at rest, [ ]  Pain in legs at night,  [ ]  Non-healing ulcer, [ ]  Blood clot in vein/DVT,   Pulmonary: [ ]  Home oxygen, [ ]  Productive cough, [ ]  Coughing up blood, [ ]  Asthma,  [ ]  Wheezing Musculoskeletal:  [ ]  Arthritis, [ ]  Low back pain, [ ]  Joint pain Hematologic: [ ]  Easy Bruising, [ ]  Anemia; [ ]  Hepatitis Gastrointestinal: [ ]  Blood in stool, [ ]  Gastroesophageal Reflux/heartburn, [ ]  Trouble swallowing Urinary: [ ]  chronic Kidney disease, [ ]  on HD - [ ]  MWF or [ ]  TTHS, [ ]  Burning with urination, [ ]  Difficulty urinating Skin: [ ]   Rashes, [ ]  Wounds Psychological: [ ]  Anxiety, [ ]  Depression   Social History History  Substance Use Topics  . Smoking status: Former Smoker -- 1.0 packs/day for 30 years    Quit date: 09/28/1973  . Smokeless tobacco: Not on file  . Alcohol Use: No     History of excessive alcohol use    Family History History reviewed. No pertinent family history.  No Known Allergies  Current Outpatient Prescriptions  Medication Sig Dispense Refill  . aspirin 81 MG tablet Take 81 mg by mouth daily.      . metoprolol (LOPRESSOR) 50 MG tablet 25 mg daily.      . metoprolol succinate (TOPROL-XL) 50 MG 24 hr tablet Take 25 mg by mouth daily. Take with or immediately following a meal.      . pravastatin (PRAVACHOL) 40 MG tablet Take 1 tablet (40 mg total) by mouth daily.  30 tablet  12  . ramipril (ALTACE) 10 MG tablet Take 2.5 mg by mouth daily.      Marland Kitchen DISCONTD: ramipril (ALTACE) 10 MG tablet Take 1 tablet (10 mg total) by mouth daily.  90 tablet  3  . Omega-3 Fatty Acids (FISH OIL PO) Take by mouth.        Physical Examination  Filed Vitals:   03/02/12 1419  BP: 115/58  Pulse: 55  Resp:     Body mass index is 23.79 kg/(m^2).  General:  WDWN in NAD Gait: Normal HEENT: WNL Eyes: Pupils equal Pulmonary: normal non-labored breathing , without Rales, rhonchi,  wheezing Cardiac: RRR, without  Murmurs, rubs or gallops; Abdomen: soft, NT, no masses Skin: no rashes, ulcers noted  Vascular Exam Pulses: 2+ radial pulses Carotid bruits: Carotid pulse to auscultation on the right, bruit heard on the left Extremities without ischemic changes, no Gangrene , no cellulitis; no open wounds;  Musculoskeletal: no muscle wasting or atrophy   Neurologic: A&O X 3; Appropriate Affect ; SENSATION: normal; MOTOR FUNCTION:  moving all extremities equally. Speech is fluent/normal  Non-Invasive Vascular Imaging CAROTID DUPLEX 03/02/2012  Right ICA 20 - 39 % stenosis Left ICA 60 - 79 %  stenosis   ASSESSMENT/PLAN: Asymptomatic patient with advanced carotid stenosis and in the left ICA which is unchanged from previous exam. The patient will followup in 6 months for repeat carotid duplex. The patient knows the signs and symptoms of CVA and knows to report to the nearest emergency department should that occur. The patient's questions were encouraged and answered, he is in agreement with this plan.  Lauree Chandler ANP   Clinic MD: Imogene Burn

## 2012-04-13 ENCOUNTER — Other Ambulatory Visit: Payer: Self-pay | Admitting: *Deleted

## 2012-04-13 DIAGNOSIS — E782 Mixed hyperlipidemia: Secondary | ICD-10-CM

## 2012-04-26 ENCOUNTER — Encounter (INDEPENDENT_AMBULATORY_CARE_PROVIDER_SITE_OTHER): Payer: Self-pay | Admitting: *Deleted

## 2012-04-27 ENCOUNTER — Encounter: Payer: Self-pay | Admitting: *Deleted

## 2012-05-11 ENCOUNTER — Encounter: Payer: Self-pay | Admitting: Cardiology

## 2012-07-04 ENCOUNTER — Other Ambulatory Visit (INDEPENDENT_AMBULATORY_CARE_PROVIDER_SITE_OTHER): Payer: Self-pay | Admitting: *Deleted

## 2012-07-04 ENCOUNTER — Telehealth (INDEPENDENT_AMBULATORY_CARE_PROVIDER_SITE_OTHER): Payer: Self-pay | Admitting: *Deleted

## 2012-07-04 ENCOUNTER — Encounter (INDEPENDENT_AMBULATORY_CARE_PROVIDER_SITE_OTHER): Payer: Self-pay | Admitting: *Deleted

## 2012-07-04 DIAGNOSIS — Z1211 Encounter for screening for malignant neoplasm of colon: Secondary | ICD-10-CM

## 2012-07-04 DIAGNOSIS — Z86010 Personal history of colonic polyps: Secondary | ICD-10-CM

## 2012-07-04 MED ORDER — PEG-KCL-NACL-NASULF-NA ASC-C 100 G PO SOLR: 1.0000 | Freq: Once | ORAL | Status: DC

## 2012-07-04 NOTE — Telephone Encounter (Signed)
Patient needs movi prep 

## 2012-07-18 ENCOUNTER — Telehealth (INDEPENDENT_AMBULATORY_CARE_PROVIDER_SITE_OTHER): Payer: Self-pay | Admitting: *Deleted

## 2012-07-18 NOTE — Telephone Encounter (Signed)
  Procedure: tcs  Reason/Indication:  Hx polyps  Has patient had this procedure before?  Yes, 2010 (EPIC)  If so, when, by whom and where?    Is there a family history of colon cancer?  no  Who?  What age when diagnosed?    Is patient diabetic?   no      Does patient have prosthetic heart valve?  no  Do you have a pacemaker?  no  Has patient had joint replacement within last 12 months?  no  Is patient on Coumadin, Plavix and/or Aspirin? yes  Medications: asa 81 mg daily, metoprolol 50 mg 1/2 tab daily, ramipril 2.5 mg daily, pravastatin 40 mg 1/2 tab daily  Allergies: nkda  Medication Adjustment: asa 2 days  Procedure date & time: 08/16/12 at 930

## 2012-07-19 NOTE — Telephone Encounter (Signed)
agree

## 2012-08-06 ENCOUNTER — Encounter (HOSPITAL_COMMUNITY): Payer: Self-pay | Admitting: Pharmacy Technician

## 2012-08-14 ENCOUNTER — Telehealth (INDEPENDENT_AMBULATORY_CARE_PROVIDER_SITE_OTHER): Payer: Self-pay | Admitting: *Deleted

## 2012-08-14 DIAGNOSIS — Z1211 Encounter for screening for malignant neoplasm of colon: Secondary | ICD-10-CM

## 2012-08-14 MED ORDER — PEG-KCL-NACL-NASULF-NA ASC-C 100 G PO SOLR: 1.0000 | Freq: Once | ORAL | Status: DC

## 2012-08-14 NOTE — Telephone Encounter (Signed)
Please resend, patient states pharmacy didn't get it -- he is scheduled for this thursday

## 2012-08-16 ENCOUNTER — Encounter (HOSPITAL_COMMUNITY)
Admission: RE | Disposition: A | Discharge status: Home Health | Payer: Self-pay | Source: Ambulatory Visit | Attending: Internal Medicine

## 2012-08-16 ENCOUNTER — Encounter (HOSPITAL_COMMUNITY): Payer: Self-pay

## 2012-08-16 ENCOUNTER — Ambulatory Visit (HOSPITAL_COMMUNITY)
Admission: RE | Admit: 2012-08-16 | Discharge: 2012-08-16 | Disposition: A | Discharge status: Home Health | Payer: Medicare Other | Source: Ambulatory Visit | Attending: Internal Medicine | Admitting: Internal Medicine

## 2012-08-16 DIAGNOSIS — K6389 Other specified diseases of intestine: Secondary | ICD-10-CM

## 2012-08-16 DIAGNOSIS — K5730 Diverticulosis of large intestine without perforation or abscess without bleeding: Secondary | ICD-10-CM | POA: Insufficient documentation

## 2012-08-16 DIAGNOSIS — D126 Benign neoplasm of colon, unspecified: Secondary | ICD-10-CM

## 2012-08-16 DIAGNOSIS — Z860100 Personal history of colon polyps, unspecified: Secondary | ICD-10-CM | POA: Insufficient documentation

## 2012-08-16 DIAGNOSIS — E785 Hyperlipidemia, unspecified: Secondary | ICD-10-CM | POA: Insufficient documentation

## 2012-08-16 DIAGNOSIS — K644 Residual hemorrhoidal skin tags: Secondary | ICD-10-CM | POA: Insufficient documentation

## 2012-08-16 DIAGNOSIS — Z09 Encounter for follow-up examination after completed treatment for conditions other than malignant neoplasm: Secondary | ICD-10-CM | POA: Insufficient documentation

## 2012-08-16 HISTORY — PX: COLONOSCOPY: SHX5424

## 2012-08-16 SURGERY — COLONOSCOPY
Anesthesia: Moderate Sedation

## 2012-08-16 MED ORDER — SODIUM CHLORIDE 0.45 % IV SOLN
INTRAVENOUS | Status: DC
  Administered 2012-08-16: 100 mL/h via INTRAVENOUS

## 2012-08-16 MED ORDER — SODIUM CHLORIDE 0.45 % IV SOLN
INTRAVENOUS | Status: DC
  Administered 2012-08-16: 09:00:00 via INTRAVENOUS

## 2012-08-16 MED ORDER — SIMETHICONE 40 MG/0.6ML PO SUSP
ORAL | Status: DC | PRN
  Administered 2012-08-16: 10:00:00

## 2012-08-16 MED ORDER — MEPERIDINE HCL 50 MG/ML IJ SOLN
INTRAMUSCULAR | Status: AC
  Filled 2012-08-16: qty 1

## 2012-08-16 MED ORDER — MEPERIDINE HCL 50 MG/ML IJ SOLN
INTRAMUSCULAR | Status: DC | PRN
  Administered 2012-08-16: 20 mg via INTRAVENOUS

## 2012-08-16 MED ORDER — MIDAZOLAM HCL 5 MG/5ML IJ SOLN
INTRAMUSCULAR | Status: DC | PRN
  Administered 2012-08-16 (×2): 2 mg via INTRAVENOUS

## 2012-08-16 MED ORDER — MIDAZOLAM HCL 5 MG/5ML IJ SOLN
INTRAMUSCULAR | Status: AC
  Filled 2012-08-16: qty 10

## 2012-08-16 NOTE — H&P (Signed)
Max Cole is an 79 y.o. male.   Chief Complaint: Patient is here for colonoscopy. HPI: Patient is an 79 year old Caucasian male with history of multiple colonic polyps who is here for surveillance examination. His last colonoscopy was in 2010. He denies abdominal pain change in his bowel habits or rectal bleeding.  Past Medical History  Diagnosis Date  . Cerebrovascular disease     Carotid ultrasound in 12/2010-60-79% left internal carotid artery, less than 40% or right RICA, no change  . Left bundle branch block   . 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  EF of 0.25 in 11/2003, 40-45% in 05/2004 and 25% in 10/2008 by echo  . Chronic kidney disease     Creatinine of 1.6 in 2009  . Hyperlipidemia 10/15/2011    Lipid profile in 03/2009:231, 136, 56, 148    Past Surgical History  Procedure Laterality Date  . Cataract extraction      Right  . Colonoscopy w/ polypectomy  2010    Diverticulosis    History reviewed. No pertinent family history. Social History:  reports that he quit smoking about 38 years ago. He does not have any smokeless tobacco history on file. He reports that he does not drink alcohol or use illicit drugs.  Allergies: No Known Allergies  Medications Prior to Admission  Medication Sig Dispense Refill  . aspirin 81 MG tablet Take 81 mg by mouth daily.      . metoprolol succinate (TOPROL-XL) 50 MG 24 hr tablet Take 25 mg by mouth daily. Take with or immediately following a meal.      . peg 3350 powder (MOVIPREP) 100 G SOLR Take 1 kit (100 g total) by mouth once.  1 kit  0  . pravastatin (PRAVACHOL) 40 MG tablet Take 20 mg by mouth daily.      . ramipril (ALTACE) 2.5 MG tablet Take 1.25 mg by mouth daily.      . Omega-3 Fatty Acids (FISH OIL PO) Take 1 capsule by mouth daily.         No results found for this or any previous visit (from the past 48 hour(s)). No results found.  ROS  Blood pressure 154/66,  temperature 97.7 F (36.5 C), temperature source Oral, resp. rate 17, height 6\' 1"  (1.854 m), weight 182 lb (82.555 kg). Physical Exam  Constitutional: He appears well-developed and well-nourished.  HENT:  Mouth/Throat: Oropharynx is clear and moist.  Eyes: Conjunctivae are normal. No scleral icterus.  Neck: No thyromegaly present.  Cardiovascular: Normal rate, regular rhythm and normal heart sounds.   No murmur heard. Respiratory: Effort normal and breath sounds normal.  GI: Soft. Bowel sounds are normal. He exhibits no distension and no mass. There is no tenderness.  Musculoskeletal: He exhibits no edema.  Lymphadenopathy:    He has no cervical adenopathy.  Neurological: He is alert.  Skin: Skin is warm and dry.     Assessment/Plan History of colonic adenomas. Surveillance colonoscopy  Max Cole U 08/16/2012, 10:03 AM

## 2012-08-16 NOTE — Op Note (Signed)
COLONOSCOPY PROCEDURE REPORT  PATIENT:  Max Cole  MR#:  [id number] Birthdate:  08-Jul-1927, 79 y.o., male Endoscopist:  Dr. Malissa Hippo, MD Referred By:  Dr. Carylon Perches, MD .Procedure Date: 08/16/2012  Procedure:   Colonoscopy with snare polypectomy.  Indications:  Patient is a [age]-year-old Caucasian male who is in multiple colonic adenomas removed on prior 3 colonoscopies. He is undergoing surveillance colonoscopy.  Informed Consent:  The procedure and risks were reviewed with the patient and informed consent was obtained.  Medications:  Demerol 20 mg IV Versed 4 mg IV  Description of procedure:  After a digital rectal exam was performed, that colonoscope was advanced from the anus through the rectum and colon to the area of the cecum, ileocecal valve and appendiceal orifice. The cecum was deeply intubated. These structures were well-seen and photographed for the record. From the level of the cecum and ileocecal valve, the scope was slowly and cautiously withdrawn. The mucosal surfaces were carefully surveyed utilizing scope tip to flexion to facilitate fold flattening as needed. The scope was pulled down into the rectum where a thorough exam including retroflexion was performed.  Findings:   Prep satisfactory. Few scattered diverticula throughout the colon. Seven small polyps ablated via cold biopsy from cecum and submitted together. These were 2-4 mm in diameter. 6 mm polyp snared from transverse colon. 8 mm polyp snared from sigmoid colon. Polyp from transverse and sigmoid colon were submitted together. Normal rectal mucosa. Small hemorrhoids below the dentate line along with anal papillae.  Therapeutic/Diagnostic Maneuvers Performed:  See above  Complications:  None  Cecal Withdrawal Time:  23 minutes  Impression:  Examination performed to cecum. Seven small polyps ablated via cold biopsy from cecum and submitted together. Two polyps were snared and submitted  together(6 mm polyp at transverse colon and 8 mm polyp at sigmoid colon. Small external hemorrhoids and anal papillae.  Recommendations:  Standard instructions given. I will contact patient with biopsy results and further recommendations.  REHMAN,NAJEEB U  08/16/2012 10:52 AM  CC: Dr. Carylon Perches, MD & Dr. Bonnetta Barry ref. provider found

## 2012-08-20 ENCOUNTER — Encounter (HOSPITAL_COMMUNITY): Payer: Self-pay | Admitting: Internal Medicine

## 2012-08-22 ENCOUNTER — Encounter (INDEPENDENT_AMBULATORY_CARE_PROVIDER_SITE_OTHER): Payer: Self-pay | Admitting: *Deleted

## 2012-08-31 ENCOUNTER — Other Ambulatory Visit: Payer: Medicare Other

## 2012-09-03 ENCOUNTER — Encounter: Payer: Self-pay | Admitting: Neurosurgery

## 2012-09-04 ENCOUNTER — Other Ambulatory Visit: Payer: Self-pay | Admitting: *Deleted

## 2012-09-04 ENCOUNTER — Ambulatory Visit: Payer: Medicare Other | Admitting: Neurosurgery

## 2012-09-04 ENCOUNTER — Other Ambulatory Visit (INDEPENDENT_AMBULATORY_CARE_PROVIDER_SITE_OTHER): Payer: Medicare Other

## 2012-09-04 DIAGNOSIS — I6529 Occlusion and stenosis of unspecified carotid artery: Secondary | ICD-10-CM

## 2012-09-04 DIAGNOSIS — I6523 Occlusion and stenosis of bilateral carotid arteries: Secondary | ICD-10-CM

## 2012-09-10 ENCOUNTER — Other Ambulatory Visit: Payer: Self-pay | Admitting: Vascular Surgery

## 2012-09-10 LAB — CREATININE, SERUM: Creat: 1.68 mg/dL — ABNORMAL HIGH (ref 0.50–1.35)

## 2012-09-11 ENCOUNTER — Other Ambulatory Visit: Payer: Self-pay | Admitting: Dermatology

## 2012-09-17 ENCOUNTER — Encounter: Payer: Self-pay | Admitting: Vascular Surgery

## 2012-09-18 ENCOUNTER — Encounter: Payer: Self-pay | Admitting: Vascular Surgery

## 2012-09-18 ENCOUNTER — Ambulatory Visit (INDEPENDENT_AMBULATORY_CARE_PROVIDER_SITE_OTHER): Payer: Medicare Other | Admitting: Vascular Surgery

## 2012-09-18 ENCOUNTER — Ambulatory Visit
Admission: RE | Admit: 2012-09-18 | Discharge: 2012-09-18 | Disposition: A | Discharge status: Home / Self Care | Payer: Medicare Other | Source: Ambulatory Visit | Attending: Vascular Surgery | Admitting: Vascular Surgery

## 2012-09-18 VITALS — BP 132/54 | HR 51 | Ht 73.0 in | Wt 180.0 lb

## 2012-09-18 DIAGNOSIS — I658 Occlusion and stenosis of other precerebral arteries: Secondary | ICD-10-CM

## 2012-09-18 DIAGNOSIS — I6523 Occlusion and stenosis of bilateral carotid arteries: Secondary | ICD-10-CM

## 2012-09-18 MED ORDER — IOHEXOL 350 MG/ML SOLN: 80.0000 mL | Freq: Once | INTRAVENOUS | Status: DC | PRN

## 2012-09-18 MED ORDER — IOHEXOL 350 MG/ML SOLN
80.0000 mL | Freq: Once | INTRAVENOUS | Status: AC | PRN
  Administered 2012-09-18: 80 mL via INTRAVENOUS

## 2012-09-18 NOTE — Progress Notes (Signed)
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 This is been followed for quite some time with noninvasive studies. He did have a CT angiogram today for further evaluation of this. His most recent duplex in September 2013 should this was at the upper end of the 60-79% stenosis range. He also has been treated for prostate cancer diagnosed for several years and has a followup visit with his urologist later this week. He has no neurologic deficits. He does have occasional orthostatic hypotension with some dizziness but this is infrequent and not severely limiting to him.  Past Medical History  Diagnosis Date  . Cerebrovascular disease     Carotid ultrasound in 12/2010-60-79% left internal carotid artery, less than 40% or right RICA, no change  . Left bundle branch block   . 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  EF of 0.25 in 11/2003, 40-45% in 05/2004 and 25% in 10/2008 by echo  . Chronic kidney disease     Creatinine of 1.6 in 2009  . Hyperlipidemia 10/15/2011    Lipid profile in 03/2009:231, 136, 56, 148  . Carotid artery occlusion     History  Substance Use Topics  . Smoking status: Former Smoker -- 1.00 packs/day for 30 years    Quit date: 09/28/1973  . Smokeless tobacco: Not on file  . Alcohol Use: No     Comment: History of excessive alcohol use    History reviewed. No pertinent family history.  No Known Allergies  Current outpatient prescriptions:metoprolol succinate (TOPROL-XL) 50 MG 24 hr tablet, Take 25 mg by mouth daily. Take with or immediately following a meal., Disp: , Rfl: ;  Omega-3 Fatty Acids (FISH OIL PO), Take 1 capsule by mouth daily. , Disp: , Rfl: ;  pravastatin (PRAVACHOL) 40 MG tablet, Take 20 mg by mouth daily., Disp: , Rfl: ;  ramipril (ALTACE) 2.5 MG tablet, Take 2.5 mg by mouth daily. , Disp: , Rfl:   BP 132/54  Pulse 51  Ht 6\' 1"  (1.854 m)  Wt  180 lb (81.647 kg)  BMI 23.75 kg/m2  SpO2 100%  Body mass index is 23.75 kg/(m^2).   Physical exam: Well-developed well-nourished white male appearing stated age in no acute distress Neurologically he is grossly intact Heart regular rate and rhythm without murmur Respirations nonlabored with equal breath sounds bilaterally Carotid arteries without bruits bilaterally 2+ radial pulses bilaterally  CT scan today was reviewed. This does show severe calcification in his left internal carotid artery at the bifurcation. He has approximately 70% stenosis of his internal carotid artery at the bifurcation.  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 This is no change from his duplex from September 2013. We will continue 6 month interval. He understands that he is just below the threshold where we would recommend endarterectomy for stroke reduction. He'll notify us immediately should he develop any symptoms otherwise be seen in 6 months with repeat carotid duplex

## 2012-09-24 ENCOUNTER — Telehealth: Payer: Self-pay | Admitting: Vascular Surgery

## 2012-09-24 NOTE — Telephone Encounter (Signed)
Max Cole saw Dr. Arbie Cookey 09/18/12, he states the results of his CT scan were not available at the time of his visit on 03/25.  He was told he would get a call once the results were available.   He is returning that call, his wife states the "nurse" didn't leave her name.  Please call him at [phone].

## 2012-09-25 NOTE — Telephone Encounter (Signed)
i called patent yesterday, [name] told him he would review and told me to tell him nothing was different from his appt dictation. Max Cole said he understood.

## 2012-10-17 ENCOUNTER — Ambulatory Visit (INDEPENDENT_AMBULATORY_CARE_PROVIDER_SITE_OTHER): Payer: Medicare Other | Admitting: Cardiology

## 2012-10-17 ENCOUNTER — Encounter: Payer: Self-pay | Admitting: Cardiology

## 2012-10-17 VITALS — BP 122/58 | HR 63 | Ht 73.0 in | Wt 183.8 lb

## 2012-10-17 DIAGNOSIS — I2589 Other forms of chronic ischemic heart disease: Secondary | ICD-10-CM

## 2012-10-17 DIAGNOSIS — I679 Cerebrovascular disease, unspecified: Secondary | ICD-10-CM

## 2012-10-17 DIAGNOSIS — I10 Essential (primary) hypertension: Secondary | ICD-10-CM

## 2012-10-17 DIAGNOSIS — N189 Chronic kidney disease, unspecified: Secondary | ICD-10-CM

## 2012-10-17 DIAGNOSIS — I255 Ischemic cardiomyopathy: Secondary | ICD-10-CM

## 2012-10-17 DIAGNOSIS — I447 Left bundle-branch block, unspecified: Secondary | ICD-10-CM

## 2012-10-17 DIAGNOSIS — E785 Hyperlipidemia, unspecified: Secondary | ICD-10-CM

## 2012-10-17 DIAGNOSIS — I428 Other cardiomyopathies: Secondary | ICD-10-CM

## 2012-10-17 NOTE — Patient Instructions (Addendum)
Your physician recommends that you schedule a follow-up appointment in: ONE YEAR  Your physician has requested that you have an echocardiogram. Echocardiography is a painless test that uses sound waves to create images of your heart. It provides your doctor with information about the size and shape of your heart and how well your heart's chambers and valves are working. This procedure takes approximately one hour. There are no restrictions for this procedure.    

## 2012-10-17 NOTE — Progress Notes (Signed)
Patient ID: Max Cole, male   DOB: September 04, 1927, 79 y.o.   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 He has completely stopped use of hard liquor, but continues to drink some wine, which he could not quantify, reporting 2 or fewer glasses of wine per day at the most.  He is remarkably active, working out at the local fitness center on a daily basis and walking up to 2 miles, playing golf and performing all of his yard work.  He was recently evaluated by Dr. Arbie Cookey for cerebrovascular disease.   Carotid ultrasound suggested a significant left ICA stenosis, verified by CT angiography as >70% left but <50% on the right. Significant C-spine spondylosis was also noted. In the absence of symptoms,  continued periodic screening was advised.  Current Outpatient Prescriptions  Medication Sig Dispense Refill  . aspirin 81 MG tablet Take 81 mg by mouth daily.      . metoprolol succinate (TOPROL-XL) 50 MG 24 hr tablet Take 25 mg by mouth daily. Take with or immediately following a meal.      . ramipril (ALTACE) 2.5 MG tablet Take 2.5 mg by mouth daily.       . metoprolol (LOPRESSOR) 50 MG tablet       . pravastatin (PRAVACHOL) 40 MG tablet Take 20 mg by mouth daily.       No current facility-administered medications for this visit.   No Known Allergies   Past medical history, social history, and family history reviewed and updated.  ROS: Denies chest discomfort, dyspnea, orthopnea, PND, lightheadedness or syncope.  He notes occasional mild palpitations that are markedly improved from a few years ago. He experiences minimal pedal edema that is not troublesome to him. All other systems reviewed and are negative.  PHYSICAL EXAM: BP 122/58  Pulse 63  Ht 6\' 1"  (1.854 m)  Wt 83.371 kg (183 lb 12.8 oz)  BMI 24.25 kg/m2;  Body mass index is 24.25 kg/(m^2). General-Well developed; no acute distress Body  habitus-proportionate weight and height Neck-No JVD; left > right carotid bruit Lungs-clear lung fields; resonant to percussion Cardiovascular-normal PMI; normal S1 and S2; minimal systolic ejection murmur; no third or fourth heart sounds Abdomen-normal bowel sounds; soft and non-tender without masses or organomegaly Musculoskeletal-No deformities, no cyanosis or clubbing Neurologic-Normal cranial nerves; symmetric strength and tone Skin-Warm, tan, no significant lesions Extremities-distal pulses intact; trace edema  EKG: Normal sinus rhythm; left bundle branch block; leftward axis. No previous tracing for comparison.  [place] Bing, MD 10/17/2012  4:21 PM  ASSESSMENT AND PLAN

## 2012-10-17 NOTE — Progress Notes (Deleted)
Name: Max Cole    DOB: 1928/01/31  Age: 79 y.o.  MR#: [id number]       PCP:  Carylon Perches, MD      Insurance: Payor: [employer] MEDICARE  Plan: [employer] MEDICARE COMPLETE  Product Type: *No Product type*    CC:   No chief complaint on file.  LIST  VS Filed Vitals:   10/17/12 1511  BP: 122/58  Pulse: 63  Height: 6\' 1"  (1.854 m)  Weight: 183 lb 12.8 oz (83.371 kg)    Weights Current Weight  10/17/12 183 lb 12.8 oz (83.371 kg)  09/18/12 180 lb (81.647 kg)  08/16/12 182 lb (82.555 kg)    Blood Pressure  BP Readings from Last 3 Encounters:  10/17/12 122/58  09/18/12 132/54  08/16/12 117/60     Admit date:  (Not on file) Last encounter with RMR:  Visit date not found   Allergy Review of patient's allergies indicates no known allergies.  Current Outpatient Prescriptions  Medication Sig Dispense Refill  . aspirin 81 MG tablet Take 81 mg by mouth daily.      . metoprolol succinate (TOPROL-XL) 50 MG 24 hr tablet Take 25 mg by mouth daily. Take with or immediately following a meal.      . ramipril (ALTACE) 2.5 MG tablet Take 2.5 mg by mouth daily.       . metoprolol (LOPRESSOR) 50 MG tablet       . pravastatin (PRAVACHOL) 40 MG tablet Take 20 mg by mouth daily.       No current facility-administered medications for this visit.    Discontinued Meds:    Medications Discontinued During This Encounter  Medication Reason  . Omega-3 Fatty Acids (FISH OIL PO) Error    Patient Active Problem List  Diagnosis  . Cerebrovascular disease  . Left bundle branch block  . Cardiomyopathy  . Chronic kidney disease  . Laboratory test  . Hyperlipidemia  . Occlusion and stenosis of carotid artery without mention of cerebral infarction  . Carotid stenosis    LABS    Component Value Date/Time   NA 143 02/26/2009   NA 139 02/19/2007 1104   K 4.7 02/26/2009   K 5.1 02/19/2007 1104   CL 109 02/26/2009   CL 104 02/19/2007 1104   CO2 24 02/26/2009   CO2 34* 02/19/2007 1104   GLUCOSE 90  02/26/2009   GLUCOSE 85 02/19/2007 1104   BUN 23 09/10/2012 0832   BUN 32 02/26/2009   BUN 16 02/19/2007 1104   CREATININE 1.68* 09/10/2012 0832   CREATININE 1.83 02/26/2009   CREATININE 1.25 02/19/2007 1104   CALCIUM 9.5 02/26/2009   CALCIUM 9.5 02/19/2007 1104   GFRNONAA 56* 02/19/2007 1104   GFRAA  Value: >60        The eGFR has been calculated using the MDRD equation. This calculation has not been validated in all clinical 02/19/2007 1104   CMP     Component Value Date/Time   NA 143 02/26/2009   K 4.7 02/26/2009   CL 109 02/26/2009   CO2 24 02/26/2009   GLUCOSE 90 02/26/2009   BUN 23 09/10/2012 0832   CREATININE 1.68* 09/10/2012 0832   CREATININE 1.83 02/26/2009   CALCIUM 9.5 02/26/2009   PROT 6.9 02/26/2009   ALBUMIN 4.1 02/26/2009   AST 16 02/26/2009   ALT 12 02/26/2009   ALKPHOS 46 02/26/2009   GFRNONAA 56* 02/19/2007 1104   GFRAA  Value: >60  The eGFR has been calculated using the MDRD equation. This calculation has not been validated in all clinical 02/19/2007 1104       Component Value Date/Time   WBC 6.6 02/26/2009   HGB 11.5 02/26/2009   HGB 12.2* 02/19/2007 1104   HCT 35.1 02/26/2009   HCT 36.1* 02/19/2007 1104   MCV 96.4 02/26/2009    Lipid Panel     Component Value Date/Time   CHOL 205 02/26/2009   TRIG 67 02/26/2009   HDL 48 02/26/2009   LDLCALC 161 02/26/2009    ABG No results found for this basename: phart, pco2, pco2art, po2, po2art, hco3, tco2, acidbasedef, o2sat     No results found for this basename: TSH   BNP (last 3 results) No results found for this basename: PROBNP,  in the last 8760 hours Cardiac Panel (last 3 results) No results found for this basename: CKTOTAL, CKMB, TROPONINI, RELINDX,  in the last 72 hours  Iron/TIBC/Ferritin No results found for this basename: iron, tibc, ferritin     EKG Orders placed in visit on 10/17/12  . EKG 12-LEAD     Prior Assessment and Plan Problem List as of 10/17/2012     ICD-9-CM   Cerebrovascular disease   Last Assessment & Plan    10/14/2011 Office Visit Written 10/15/2011  4:28 PM by Kathlen Brunswick, MD     Moderate to severe cerebrovascular disease with a moderate to severe left internal carotid artery stenosis.  In the presence of known vascular disease, treatment of mild to moderate hyperlipidemia would be appropriate.    Left bundle branch block   Cardiomyopathy   Last Assessment & Plan   10/14/2011 Office Visit Written 10/15/2011  4:27 PM by Kathlen Brunswick, MD     Patient continues to do extremely well despite long-standing severe cardiomyopathy.  ACE Inhibitor therapy has been maintained.  Treatment with beta blocker has been stopped due to bradycardia.  Aldactone was not required in the absence of symptoms.    Chronic kidney disease   Last Assessment & Plan   10/14/2011 Office Visit Written 10/15/2011  4:28 PM by Kathlen Brunswick, MD     Renal dysfunction has been progressive over the past 3 years.  Continued monitoring is appropriate, and referral to a nephrologist should be considered.    Laboratory test   Hyperlipidemia   Last Assessment & Plan   10/14/2011 Office Visit Written 10/15/2011  4:29 PM by Kathlen Brunswick, MD     Moderate hyperlipidemia has been present for some time, but patient has refused treatment with statins.  I. Once again raised the issue with him, and he now agrees to accept that therapy.  Pravastatin will be started at a dose of 40 mg per day with a repeat lipid profile in 2 months.    Occlusion and stenosis of carotid artery without mention of cerebral infarction   Carotid stenosis       Imaging: Ct Angio Neck W/cm &/or Wo/cm  09/18/2012  *RADIOLOGY REPORT*  Clinical Data:  Bilateral carotid artery stenosis.  Dizziness.  CT ANGIOGRAPHY NECK  Technique:  Multidetector CT imaging of the neck was performed using the standard protocol during bolus administration of intravenous contrast.  Multiplanar CT image reconstructions including MIPs were obtained to evaluate the vascular anatomy.  Carotid stenosis measurements (when applicable) are obtained utilizing NASCET criteria, using the distal internal carotid diameter as the denominator.  Contrast: 80mL OMNIPAQUE IOHEXOL 350 MG/ML SOLN  Comparison:  Carotid Doppler  ultrasound study at [facility] 06/10/2010.  Findings:  A common origin of the innominate artery and left common carotid artery is evident.  The vertebral arteries both originate from the subclavian arteries.  The right vertebral artery is the dominant vessel.  There is some tortuosity within the proximal right vertebral artery without significant stenosis.  The vessel enters the spinal canal at C5-6.  Facet hypertrophy results in some encroachment on the right foramen transversarium at to C3-4.  Mild tortuosity is present.  There is no other significant stenosis.  The left vertebral artery is hypoplastic throughout its course in the neck.  No focal stenosis is evident.  The right common carotid artery is normal.  Dense calcifications are present within the proximal right internal carotid artery. The minimal luminal diameter is 2.5 mm.  This compares with a more normal distal ICA of 4.4 mm.  No other focal stenosis is present in the cervical ICA.  The left common carotid artery is within normal limits.  Dense atherosclerotic calcifications are present within the proximal left internal carotid artery.  12 mm from the bifurcation there is a focal stenosis with a minimal luminal diameter of the 1.2 mm.  This compares with a more normal distal lumen of 4.4 mm.  The calculated stenosis is 73%. A tiny posterior ulcerative plaque is evident at the level of the stenosis.  The more distal right eye SCA left ICA is tortuous, but without additional stenoses.  The multilevel degenerative endplate changes are present. Uncovertebral spurring is seen bilaterally from C2-3 through C6-7, right greater than left.  This results in multilevel central and foraminal stenosis, worse on the right.   Review  of the MIP images confirms the above findings.  IMPRESSION:  1.  Greater than 70% stenosis of the proximal left internal carotid artery with an irregular calcified and noncalcified plaque. 2.  Focal posterior ulceration at the level of the left ICA stenosis. 3.  Less than 50% stenosis of the proximal right internal carotid artery. 4.  Focal calcification with associated stenosis of the proximal nondominant left vertebral artery. 5.  Tortuosity without significant stenosis in the dominant right vertebral artery. 6.  There is focal encroachment on the right C3-4 foramen transversarium by facet hypertrophy. 7.  Moderate to severe multilevel spondylosis of the cervical spine is worse right than left.   Original Report Authenticated By: Marin Roberts, M.D.

## 2012-10-17 NOTE — Assessment & Plan Note (Signed)
If patient does require an AICD, the establishment of biventricular pacing at that time will likely be appropriate.

## 2012-10-17 NOTE — Assessment & Plan Note (Signed)
Moderate cerebrovascular disease without symptoms. Treatment with aspirin will be continued as noted above. Dr. Arbie Cookey is monitoring with semiannual imaging studies.

## 2012-10-17 NOTE — Assessment & Plan Note (Signed)
No deterioration in renal function over the past 5 years.  Continued monitoring and optimization of medical therapy are appropriate.

## 2012-10-17 NOTE — Assessment & Plan Note (Signed)
Lipid profile was suboptimal in 03/2012: More recent lipid levels will be sought from Dr. Ouida Sills.

## 2012-10-17 NOTE — Assessment & Plan Note (Signed)
Patient continues to do outstandingly well from a symptomatic standpoint.  Most recent echocardiogram in 10/2008 suggested the need for prophylactic AICD placement, as EF was 25%. His clinical course would suggest improvement, and echocardiography will be repeated to reassess LV function. If markedly impaired, he will be referred to EP for evaluation of the need for an AICD.  Medical therapy is not absolutely optimal for an individual with severe left ventricular function, but he is doing so well, I do not believe that modification is in order. He does not require aspirin therapy for coronary artery disease, but continuation is reasonable in light of his very significant cerebrovascular disease.

## 2012-10-19 ENCOUNTER — Ambulatory Visit (HOSPITAL_COMMUNITY)
Admission: RE | Admit: 2012-10-19 | Discharge: 2012-10-19 | Disposition: A | Discharge status: Home / Self Care | Payer: Medicare Other | Source: Ambulatory Visit | Attending: Cardiology | Admitting: Cardiology

## 2012-10-19 DIAGNOSIS — N189 Chronic kidney disease, unspecified: Secondary | ICD-10-CM | POA: Insufficient documentation

## 2012-10-19 DIAGNOSIS — I129 Hypertensive chronic kidney disease with stage 1 through stage 4 chronic kidney disease, or unspecified chronic kidney disease: Secondary | ICD-10-CM | POA: Insufficient documentation

## 2012-10-19 DIAGNOSIS — I10 Essential (primary) hypertension: Secondary | ICD-10-CM

## 2012-10-19 DIAGNOSIS — I255 Ischemic cardiomyopathy: Secondary | ICD-10-CM

## 2012-10-19 DIAGNOSIS — I2589 Other forms of chronic ischemic heart disease: Secondary | ICD-10-CM | POA: Insufficient documentation

## 2012-10-19 DIAGNOSIS — I359 Nonrheumatic aortic valve disorder, unspecified: Secondary | ICD-10-CM

## 2012-10-21 ENCOUNTER — Encounter: Payer: Self-pay | Admitting: Cardiology

## 2013-04-16 ENCOUNTER — Ambulatory Visit: Payer: Medicare Other | Admitting: Vascular Surgery

## 2013-04-16 ENCOUNTER — Other Ambulatory Visit: Payer: Medicare Other

## 2013-05-20 ENCOUNTER — Encounter: Payer: Self-pay | Admitting: Family

## 2013-05-21 ENCOUNTER — Ambulatory Visit (HOSPITAL_COMMUNITY)
Admission: RE | Admit: 2013-05-21 | Discharge: 2013-05-21 | Disposition: A | Discharge status: Home / Self Care | Payer: Medicare Other | Source: Ambulatory Visit | Attending: Vascular Surgery | Admitting: Vascular Surgery

## 2013-05-21 ENCOUNTER — Encounter: Payer: Self-pay | Admitting: Family

## 2013-05-21 ENCOUNTER — Ambulatory Visit (INDEPENDENT_AMBULATORY_CARE_PROVIDER_SITE_OTHER): Payer: Medicare Other | Admitting: Family

## 2013-05-21 VITALS — BP 153/66 | HR 59 | Resp 20 | Ht 73.0 in | Wt 180.3 lb

## 2013-05-21 DIAGNOSIS — I6529 Occlusion and stenosis of unspecified carotid artery: Secondary | ICD-10-CM | POA: Insufficient documentation

## 2013-05-21 DIAGNOSIS — I658 Occlusion and stenosis of other precerebral arteries: Secondary | ICD-10-CM

## 2013-05-21 DIAGNOSIS — I6523 Occlusion and stenosis of bilateral carotid arteries: Secondary | ICD-10-CM

## 2013-05-21 NOTE — Progress Notes (Signed)
Established Carotid Patient  History of Present Illness  Max Cole is a 79 y.o. male who has known carotid stenosis.  Patient has not had previous ICA intervention. Patient states his blood pressure was 120's systolic in his PCP's office recently. Walks 2 miles, 5 days/week. Patient states he rarely has irregular heart rate, is not taking anticoagulant, patient states this is being monitored by his PCP.  Patient has Negative history of TIA or stroke symptom.  The patient denies amaurosis fugax or monocular blindness.  The patient  denies facial drooping.  Pt. denies hemiplegia.  The patient denies receptive or expressive aphasia.  Pt. denies extremity weakness. Patient denies claudication symptoms, denies non-healing wounds.   Patient denies New Medical or Surgical History.  Pt Diabetic: No Pt smoker: non-smoker  Pt meds include: Statin : Yes Betablocker: Yes ASA: Yes Other anticoagulants/antiplatelets: no   Past Medical History  Diagnosis Date  . Cerebrovascular disease     Carotid ultrasound in 12/2010-60-79% left internal carotid artery, less than 40% or right RICA, no change  . Left bundle branch block   . 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  EF of 0.25 in 11/2003, 40-45% in 05/2004 and 25% in 10/2008 by echo  . Chronic kidney disease     Creatinine of 1.6 in 2009  . Hyperlipidemia 10/15/2011    Lipid profile in 03/2009:231, 136, 56, 148  . Carotid artery occlusion   . Prostate carcinoma   . Atrial fibrillation     Social History History  Substance Use Topics  . Smoking status: Former Smoker -- 1.00 packs/day for 30 years    Quit date: 09/28/1973  . Smokeless tobacco: Not on file  . Alcohol Use: 6.0 oz/week    5 Glasses of wine, 5 Shots of liquor per week     Comment: History of excessive alcohol use    Family History Family History  Problem Relation Age of Onset  . Family history unknown: Yes     Surgical History Past Surgical History  Procedure Laterality Date  . Cataract extraction      Right  . Colonoscopy w/ polypectomy  2010    Diverticulosis  . Colonoscopy N/A 08/16/2012    Procedure: COLONOSCOPY;  Surgeon: Malissa Hippo, MD;  [facility]: [facility];  Service: Endoscopy;  Laterality: N/A;  930    No Known Allergies  Current Outpatient Prescriptions  Medication Sig Dispense Refill  . aspirin 81 MG tablet Take 81 mg by mouth daily.      . metoprolol (LOPRESSOR) 50 MG tablet Take by mouth. Take 1/2 tablet daily      . pravastatin (PRAVACHOL) 10 MG tablet Take 10 mg by mouth daily.      . ramipril (ALTACE) 2.5 MG tablet Take by mouth. Take 1/2 tab daily      . metoprolol succinate (TOPROL-XL) 50 MG 24 hr tablet Take 25 mg by mouth daily. Take with or immediately following a meal.      . pravastatin (PRAVACHOL) 40 MG tablet Take 20 mg by mouth daily.       No current facility-administered medications for this visit.    Review of Systems : [x]  Positive   [ ]  Denies  General:[ ]  Weight loss,  [ ]  Weight gain, [ ]  Loss of appetite, [ ]  Fever, [ ]  chills  Neurologic: [ ]  Dizziness, [ ]  Blackouts, [ ]  Headaches, [ ]  Seizure [ ]   Stroke, [ ]  "Mini stroke", [ ]  Slurred speech, [ ]  Temporary blindness;  [ ] weakness,  Ear/Nose/Throat: [ ]  Change in hearing, [ ]  Nose bleeds, [ ]  Hoarseness  Vascular:[ ]  Pain in legs with walking, [ ]  Pain in feet while lying flat , [ ]   Non-healing ulcer, [ ]  Blood clot in vein,    Pulmonary: [ ]  Home oxygen, [ ]   Productive cough, [ ]  Bronchitis, [ ]  Coughing up blood,  [ ]  Asthma, [ ]  Wheezing  Musculoskeletal:  [ ]  Arthritis, [ ]  Joint pain, [ ]  low back pain  Cardiac: [ ]  Chest pain, [ ]  Shortness of breath when lying flat, [ ]  Shortness of breath with exertion, [ ]  Palpitations, [ ]  Heart murmur, [ ]   Atrial fibrillation  Hematologic:[ ]  Easy Bruising, [ ]  Anemia; [ ]  Hepatitis  Psychiatric: [ ]   Depression, [ ]  Anxiety    Gastrointestinal: [ ]  Black stool, [ ]  Blood in stool, [ ]  Peptic ulcer disease,  [ ]  Gastroesophageal Reflux, [ ]  Trouble swallowing, [ ]  Diarrhea, [ ]  Constipation  Urinary: [ ]  chronic Kidney disease, [ ]  on HD, [ ]  Burning with urination, [ ]  Frequent urination, [ ]  Difficulty urinating;   Skin: [ ]  Rashes, [ ]  Wounds    Physical Examination  Filed Vitals:   05/21/13 1523  BP: 153/66  Pulse:   Resp:    Filed Weights   05/21/13 1522  Weight: 180 lb 4.8 oz (81.784 kg)   Body mass index is 23.79 kg/(m^2).   General: WDWN male in NAD GAIT: normal Eyes: PERRLA Pulmonary:  CTAB, Negative  Rales, Negative rhonchi, & Negative wheezing.  Cardiac: regular Rhythm ,  Negative Murmurs.  VASCULAR EXAM Carotid Bruits Left Right   Negative Negative   Radial pulses are 2+ palpable and  equal.                                                                                                                       Gastrointestinal: soft, nontender, BS WNL, no r/g,  negative masses.  Musculoskeletal: Negative muscle atrophy/wasting. M/S 5/5 throughout, Extremities without ischemic changes.  Neurologic: A&O X 3; Appropriate Affect ; SENSATION; Speech is normal CN 2-12 intact, Pain and light touch intact in extremities, Motor exam as listed above.   Non-Invasive Vascular Imaging CAROTID DUPLEX 05/21/2013   Right ICA: <40% stenosis. Left ICA: 60 - 79 % stenosis.  These findings are Unchanged from previous exam.  CT angiography of neck on 09/17/12: Within the proximal left  internal carotid artery,  12 mm from the bifurcation, there is a  focal stenosis with a minimal luminal diameter of the 1.2 mm. This  compares with a more normal distal lumen of 4.4 mm. The calculated  stenosis is 73%. A tiny posterior ulcerative plaque is evident at  the level of the stenosis. Dr. Arbie Cookey reviewed these results as indicated in his progress note of 09/18/12.   Assessment: Max Cole is  a 79 y.o. male who presents with asymptomatic minimal right ICA stenosis and 60-79% left ICA  Stenosis. The  ICA stenosis is  Unchanged from previous exam.  Plan: Follow-up in 6 months with Carotid Duplex scan.   I discussed in depth with the patient the nature of atherosclerosis, and emphasized the importance of maximal medical management including strict control of blood pressure, blood glucose, and lipid levels, obtaining regular exercise, and continued cessation of smoking.  The patient is aware that without maximal medical management the underlying atherosclerotic disease process will progress, limiting the benefit of any interventions. The patient was given information about stroke prevention and what symptoms should prompt the patient to seek immediate medical care. Thank you for allowing [place] to participate in this patient's care.  Charisse March, RN, MSN, FNP-C [facility] Office: [phone]  Clinic Physician: Early  05/21/2013 3:39 PM

## 2013-05-21 NOTE — Patient Instructions (Signed)
Stroke Prevention Some medical conditions and behaviors are associated with an increased chance of having a stroke. You may prevent a stroke by making healthy choices and managing medical conditions. Reduce your risk of having a stroke by:  Staying physically active. Get at least 30 minutes of activity on most or all days.  Not smoking. It may also be helpful to avoid exposure to secondhand smoke.  Limiting alcohol use. Moderate alcohol use is considered to be:  No more than 2 drinks per day for men.  No more than 1 drink per day for nonpregnant women.  Eating healthy foods.  Include 5 or more servings of fruits and vegetables a day.  Certain diets may be prescribed to address high blood pressure, high cholesterol, diabetes, or obesity.  Managing your cholesterol levels.  A low-saturated fat, low-trans fat, low-cholesterol, and high-fiber diet may control cholesterol levels.  Take any prescribed medicines to control cholesterol as directed by your caregiver.  Managing your diabetes.  A controlled-carbohydrate, controlled-sugar diet is recommended to manage diabetes.  Take any prescribed medicines to control diabetes as directed by your caregiver.  Controlling your high blood pressure (hypertension).  A low-salt (sodium), low-saturated fat, low-trans fat, and low-cholesterol diet is recommended to manage high blood pressure.  Take any prescribed medicines to control hypertension as directed by your caregiver.  Maintaining a healthy weight.  A reduced-calorie, low-sodium, low-saturated fat, low-trans fat, low-cholesterol diet is recommended to manage weight.  Stopping drug abuse.  Avoiding birth control pills.  Talk to your caregiver about the risks of taking birth control pills if you are over 35 years old, smoke, get migraines, or have ever had a blood clot.  Getting evaluated for sleep disorders (sleep apnea).  Talk to your caregiver about getting a sleep evaluation  if you snore a lot or have excessive sleepiness.  Taking medicines as directed by your caregiver.  For some people, aspirin or blood thinners (anticoagulants) are helpful in reducing the risk of forming abnormal blood clots that can lead to stroke. If you have the irregular heart rhythm of atrial fibrillation, you should be on a blood thinner unless there is a good reason you cannot take them.  Understand all your medicine instructions. SEEK IMMEDIATE MEDICAL CARE IF:   You have sudden weakness or numbness of the face, arm, or leg, especially on one side of the body.  You have sudden confusion.  You have trouble speaking (aphasia) or understanding.  You have sudden trouble seeing in one or both eyes.  You have sudden trouble walking.  You have dizziness.  You have a loss of balance or coordination.  You have a sudden, severe headache with no known cause.  You have new chest pain or an irregular heartbeat. Any of these symptoms may represent a serious problem that is an emergency. Do not wait to see if the symptoms will go away. Get medical help right away. Call your local emergency services (911 in [place] Do not drive yourself to the hospital. Document Released: 07/21/2004 Document Revised: 09/05/2011 Document Reviewed: 12/14/2012 [employer] Patient Information 2014 [employer]  

## 2013-05-22 NOTE — Addendum Note (Signed)
Addended by: Melodye Ped C on: 05/22/2013 09:50 AM   Modules accepted: Orders

## 2013-11-15 ENCOUNTER — Encounter: Payer: Self-pay | Admitting: Family

## 2013-11-19 ENCOUNTER — Ambulatory Visit (INDEPENDENT_AMBULATORY_CARE_PROVIDER_SITE_OTHER): Payer: Medicare Other | Admitting: Family

## 2013-11-19 ENCOUNTER — Encounter: Payer: Self-pay | Admitting: Family

## 2013-11-19 ENCOUNTER — Ambulatory Visit (HOSPITAL_COMMUNITY)
Admission: RE | Admit: 2013-11-19 | Discharge: 2013-11-19 | Disposition: A | Discharge status: Home / Self Care | Payer: Medicare Other | Source: Ambulatory Visit | Attending: Family | Admitting: Family

## 2013-11-19 VITALS — BP 143/71 | HR 61 | Resp 14 | Ht 73.0 in | Wt 182.0 lb

## 2013-11-19 DIAGNOSIS — I6529 Occlusion and stenosis of unspecified carotid artery: Secondary | ICD-10-CM | POA: Insufficient documentation

## 2013-11-19 DIAGNOSIS — I6523 Occlusion and stenosis of bilateral carotid arteries: Secondary | ICD-10-CM | POA: Insufficient documentation

## 2013-11-19 DIAGNOSIS — I658 Occlusion and stenosis of other precerebral arteries: Secondary | ICD-10-CM

## 2013-11-19 NOTE — Patient Instructions (Signed)

## 2013-11-19 NOTE — Progress Notes (Signed)
Established Carotid Patient   History of Present Illness  Max Cole is a 80 y.o. male patient of Dr. Donnetta Hutching who has known carotid artery stenosis. He returns today for follow up.  Patient has not had previous ICA intervention.   Walks 2 miles, 5 days/week.   Patient has Negative history of TIA or stroke symptom. The patient denies amaurosis fugax or monocular blindness. The patient denies facial drooping.  Pt. denies hemiplegia. The patient denies receptive or expressive aphasia. Pt. denies extremity weakness.  Patient denies claudication symptoms, denies non-healing wounds.  Patient denies New Medical or Surgical History.  Pt Diabetic: No  Pt smoker: non-smoker  Pt meds include:  Statin : Yes  Betablocker: Yes  ASA: Yes  Other anticoagulants/antiplatelets: no    Past Medical History  Diagnosis Date  . Cerebrovascular disease     Carotid ultrasound in 12/2010-60-79% left internal carotid artery, less than 40% or right RICA, no change  . Left bundle branch block   . Cardiomyopathy 2005    Possibly alcoholic; cath in 0623-76% ostial D1, PCW of 12, EF of 35-40%.  EF of 0.25 in 11/2003, 40-45% in 05/2004 and 25% in 10/2008 by echo  . Chronic kidney disease     Creatinine of 1.6 in 2009  . Hyperlipidemia 10/15/2011    Lipid profile in 03/2009:231, 136, 56, 148  . Carotid artery occlusion   . Prostate carcinoma   . Atrial fibrillation     Social History History  Substance Use Topics  . Smoking status: Former Smoker -- 1.00 packs/day for 30 years    Quit date: 09/28/1973  . Smokeless tobacco: Never Used  . Alcohol Use: 6.0 oz/week    5 Glasses of wine, 5 Shots of liquor per week     Comment: History of excessive alcohol use    Family History Family History  Problem Relation Age of Onset  . Hypertension Mother     Surgical History Past Surgical History  Procedure Laterality Date  . Cataract extraction      Right  . Colonoscopy w/ polypectomy  2010     Diverticulosis  . Colonoscopy N/A 08/16/2012    Procedure: COLONOSCOPY;  Surgeon: Rogene Houston, MD;  Location: [facility];  Service: Endoscopy;  Laterality: N/A;  930  . Eye surgery Right     Cataract    No Known Allergies  Current Outpatient Prescriptions  Medication Sig Dispense Refill  . aspirin 81 MG tablet Take 81 mg by mouth daily.      . metoprolol (LOPRESSOR) 50 MG tablet Take by mouth. Take 1/2 tablet daily      . metoprolol succinate (TOPROL-XL) 50 MG 24 hr tablet Take 25 mg by mouth daily. Take with or immediately following a meal.      . ramipril (ALTACE) 2.5 MG tablet Take by mouth. Take 1/2 tab daily      . pravastatin (PRAVACHOL) 10 MG tablet Take 10 mg by mouth daily.      . pravastatin (PRAVACHOL) 40 MG tablet Take 20 mg by mouth daily.       No current facility-administered medications for this visit.    Review of Systems : See HPI for pertinent positives and negatives.  Physical Examination   Filed Vitals:   11/19/13 1528 11/19/13 1531  BP: 143/72 143/71  Pulse: 60 61  Resp: 14 14  Height:  6\' 1"  (1.854 m)  Weight:  182 lb (82.555 kg)  SpO2:  100%   Body  mass index is 24.02 kg/(m^2).   General: WDWN male in NAD  GAIT: normal  Eyes: PERRLA  Pulmonary: CTAB, Negative Rales, Negative rhonchi, & Negative wheezing.  Cardiac: regular Rhythm , Negative Murmurs.   VASCULAR EXAM  Carotid Bruits  Left  Right    Negative  Negative    Radial pulses are 2+ palpable and equal.  Gastrointestinal: soft, nontender, BS WNL, no r/g, negative masses.  Musculoskeletal: Negative muscle atrophy/wasting. M/S 5/5 throughout, Extremities without ischemic changes.  Neurologic: A&O X 3; Appropriate Affect ; SENSATION;  Speech is normal  CN 2-12 intact, Pain and light touch intact in extremities, Motor exam as listed above.    Non-Invasive Vascular Imaging CAROTID DUPLEX 11/19/2013   CEREBROVASCULAR DUPLEX EVALUATION    INDICATION: Follow-up carotid disease      PREVIOUS INTERVENTION(S):     DUPLEX EXAM:     RIGHT  LEFT  Peak Systolic Velocities (cm/s) End Diastolic Velocities (cm/s) Plaque LOCATION Peak Systolic Velocities (cm/s) End Diastolic Velocities (cm/s) Plaque  134 14  CCA PROXIMAL 160 22   101 19  CCA MID 90 18   74 17  CCA DISTAL 80 18   151 10  ECA 117 13   99 28 HT ICA PROXIMAL 331 95 HT  116 36  ICA MID 113 28 HT  121 24  ICA DISTAL 125 37     1.3 ICA / CCA Ratio (PSV) 4.1  Antegrade  Vertebral Flow Antegrade   412 Brachial Systolic Pressure (mmHg) 878  Within normal limits  Brachial Artery Waveforms Within normal limits     Plaque Morphology:  HM = Homogeneous, HT = Heterogeneous, CP = Calcific Plaque, SP = Smooth Plaque, IP = Irregular Plaque    IMPRESSION: 1. Evidence of <40% stenosis of the right internal carotid artery. 2. Evidence of 60%-79% stenosis of the left internal carotid artery. 3. Bilateral vertebral artery is antegrade.    Compared to the previous exam:  No significant change compared to prior exam.     Assessment: Max Cole is a 80 y.o. male who has known carotid artery stenosis. He has no history of stroke or TIA. He does not use tobacco and does not have DM. Today's carotid Duplex indicates evidence of <40% stenosis of the right internal carotid artery and evidence of 60%-79% stenosis of the left internal carotid artery. Bilateral vertebral artery is antegrade.  The  ICA stenosis is  Unchanged from previous exam.  Plan: Follow-up in 6 months with Carotid Duplex scan.   I discussed in depth with the patient the nature of atherosclerosis, and emphasized the importance of maximal medical management including strict control of blood pressure, blood glucose, and lipid levels, obtaining regular exercise, and continued cessation of smoking.  The patient is aware that without maximal medical management the underlying atherosclerotic disease process will progress, limiting the benefit of any  interventions. The patient was given information about stroke prevention and what symptoms should prompt the patient to seek immediate medical care. Thank you for allowing [place] to participate in this patient's care.  Clemon Chambers, RN, MSN, FNP-C [facility] Office: [phone]  Clinic Physician: Early  11/19/2013 3:49 PM

## 2014-05-26 ENCOUNTER — Encounter: Payer: Self-pay | Admitting: Family

## 2014-05-27 ENCOUNTER — Ambulatory Visit (HOSPITAL_COMMUNITY)
Admission: RE | Admit: 2014-05-27 | Discharge: 2014-05-27 | Disposition: A | Discharge status: Home / Self Care | Payer: Medicare Other | Source: Ambulatory Visit | Attending: Family | Admitting: Family

## 2014-05-27 ENCOUNTER — Encounter: Payer: Medicare Other | Admitting: Family

## 2014-05-27 ENCOUNTER — Encounter: Payer: Self-pay | Admitting: Family

## 2014-05-27 DIAGNOSIS — I6523 Occlusion and stenosis of bilateral carotid arteries: Secondary | ICD-10-CM | POA: Diagnosis present

## 2014-05-28 NOTE — Progress Notes (Signed)
Lab only 

## 2014-05-29 ENCOUNTER — Encounter: Payer: Self-pay | Admitting: Family

## 2014-06-02 ENCOUNTER — Other Ambulatory Visit: Payer: Self-pay

## 2014-06-02 ENCOUNTER — Encounter: Payer: Self-pay | Admitting: Vascular Surgery

## 2014-06-02 ENCOUNTER — Encounter: Payer: Self-pay | Admitting: Family

## 2014-06-02 DIAGNOSIS — Z48812 Encounter for surgical aftercare following surgery on the circulatory system: Secondary | ICD-10-CM

## 2014-06-02 DIAGNOSIS — I6523 Occlusion and stenosis of bilateral carotid arteries: Secondary | ICD-10-CM

## 2014-06-02 NOTE — Patient Instructions (Signed)
Dear Max Cole,, Your recent Vascular Lab  study on May 27, 2014 indicates: NO change, you are just below the threshold for removal of the Left Carotid Artery blockage. Please follow up with [place] in 6 months.          Stroke Prevention Some medical conditions and behaviors are associated with an increased chance of having a stroke. You may prevent a stroke by making healthy choices and managing medical conditions. HOW CAN I REDUCE MY RISK OF HAVING A STROKE?   Stay physically active. Get at least 30 minutes of activity on most or all days.  Do not smoke. It may also be helpful to avoid exposure to secondhand smoke.  Limit alcohol use. Moderate alcohol use is considered to be:  No more than 2 drinks per day for men.  No more than 1 drink per day for nonpregnant women.  Eat healthy foods. This involves:  Eating 5 or more servings of fruits and vegetables a day.  Making dietary changes that address high blood pressure (hypertension), high cholesterol, diabetes, or obesity.  Manage your cholesterol levels.  Making food choices that are high in fiber and low in saturated fat, trans fat, and cholesterol may control cholesterol levels.  Take any prescribed medicines to control cholesterol as directed by your health care provider.  Manage your diabetes.  Controlling your carbohydrate and sugar intake is recommended to manage diabetes.  Take any prescribed medicines to control diabetes as directed by your health care provider.  Control your hypertension.  Making food choices that are low in salt (sodium), saturated fat, trans fat, and cholesterol is recommended to manage hypertension.  Take any prescribed medicines to control hypertension as directed by your health care provider.  Maintain a healthy weight.  Reducing calorie intake and making food choices that are low in sodium, saturated fat, trans fat, and cholesterol are recommended to manage weight.  Stop drug  abuse.  Avoid taking birth control pills.  Talk to your health care provider about the risks of taking birth control pills if you are over 3 years old, smoke, get migraines, or have ever had a blood clot.  Get evaluated for sleep disorders (sleep apnea).  Talk to your health care provider about getting a sleep evaluation if you snore a lot or have excessive sleepiness.  Take medicines only as directed by your health care provider.  For some people, aspirin or blood thinners (anticoagulants) are helpful in reducing the risk of forming abnormal blood clots that can lead to stroke. If you have the irregular heart rhythm of atrial fibrillation, you should be on a blood thinner unless there is a good reason you cannot take them.  Understand all your medicine instructions.  Make sure that other conditions (such as anemia or atherosclerosis) are addressed. SEEK IMMEDIATE MEDICAL CARE IF:   You have sudden weakness or numbness of the face, arm, or leg, especially on one side of the body.  Your face or eyelid droops to one side.  You have sudden confusion.  You have trouble speaking (aphasia) or understanding.  You have sudden trouble seeing in one or both eyes.  You have sudden trouble walking.  You have dizziness.  You have a loss of balance or coordination.  You have a sudden, severe headache with no known cause.  You have new chest pain or an irregular heartbeat. Any of these symptoms may represent a serious problem that is an emergency. Do not wait to see if the symptoms  will go away. Get medical help at once. Call your local emergency services (911 in [place] Do not drive yourself to the hospital. Document Released: 07/21/2004 Document Revised: 10/28/2013 Document Reviewed: 12/14/2012 [facility] Patient Information 2015 [place], [place]. This information is not intended to replace advice given to you by your health care provider. Make sure you discuss any questions you have with  your health care provider.

## 2014-09-10 DIAGNOSIS — M5033 Other cervical disc degeneration, cervicothoracic region: Secondary | ICD-10-CM | POA: Diagnosis not present

## 2014-09-10 DIAGNOSIS — M9901 Segmental and somatic dysfunction of cervical region: Secondary | ICD-10-CM | POA: Diagnosis not present

## 2014-09-15 DIAGNOSIS — M9901 Segmental and somatic dysfunction of cervical region: Secondary | ICD-10-CM | POA: Diagnosis not present

## 2014-09-15 DIAGNOSIS — M5033 Other cervical disc degeneration, cervicothoracic region: Secondary | ICD-10-CM | POA: Diagnosis not present

## 2014-09-17 DIAGNOSIS — M9901 Segmental and somatic dysfunction of cervical region: Secondary | ICD-10-CM | POA: Diagnosis not present

## 2014-09-17 DIAGNOSIS — M5033 Other cervical disc degeneration, cervicothoracic region: Secondary | ICD-10-CM | POA: Diagnosis not present

## 2014-09-18 DIAGNOSIS — M5033 Other cervical disc degeneration, cervicothoracic region: Secondary | ICD-10-CM | POA: Diagnosis not present

## 2014-09-18 DIAGNOSIS — M9901 Segmental and somatic dysfunction of cervical region: Secondary | ICD-10-CM | POA: Diagnosis not present

## 2014-09-22 DIAGNOSIS — M5033 Other cervical disc degeneration, cervicothoracic region: Secondary | ICD-10-CM | POA: Diagnosis not present

## 2014-09-22 DIAGNOSIS — M9901 Segmental and somatic dysfunction of cervical region: Secondary | ICD-10-CM | POA: Diagnosis not present

## 2014-09-25 DIAGNOSIS — M9901 Segmental and somatic dysfunction of cervical region: Secondary | ICD-10-CM | POA: Diagnosis not present

## 2014-09-25 DIAGNOSIS — M5033 Other cervical disc degeneration, cervicothoracic region: Secondary | ICD-10-CM | POA: Diagnosis not present

## 2014-09-29 DIAGNOSIS — M5033 Other cervical disc degeneration, cervicothoracic region: Secondary | ICD-10-CM | POA: Diagnosis not present

## 2014-09-29 DIAGNOSIS — M9901 Segmental and somatic dysfunction of cervical region: Secondary | ICD-10-CM | POA: Diagnosis not present

## 2014-10-01 DIAGNOSIS — M5033 Other cervical disc degeneration, cervicothoracic region: Secondary | ICD-10-CM | POA: Diagnosis not present

## 2014-10-01 DIAGNOSIS — M9901 Segmental and somatic dysfunction of cervical region: Secondary | ICD-10-CM | POA: Diagnosis not present

## 2014-10-06 DIAGNOSIS — M5033 Other cervical disc degeneration, cervicothoracic region: Secondary | ICD-10-CM | POA: Diagnosis not present

## 2014-10-06 DIAGNOSIS — M9901 Segmental and somatic dysfunction of cervical region: Secondary | ICD-10-CM | POA: Diagnosis not present

## 2014-10-08 DIAGNOSIS — M9901 Segmental and somatic dysfunction of cervical region: Secondary | ICD-10-CM | POA: Diagnosis not present

## 2014-10-08 DIAGNOSIS — M5033 Other cervical disc degeneration, cervicothoracic region: Secondary | ICD-10-CM | POA: Diagnosis not present

## 2014-11-10 DIAGNOSIS — Z79899 Other long term (current) drug therapy: Secondary | ICD-10-CM | POA: Diagnosis not present

## 2014-11-17 DIAGNOSIS — I5022 Chronic systolic (congestive) heart failure: Secondary | ICD-10-CM | POA: Diagnosis not present

## 2014-11-17 DIAGNOSIS — C61 Malignant neoplasm of prostate: Secondary | ICD-10-CM | POA: Diagnosis not present

## 2014-11-17 DIAGNOSIS — N183 Chronic kidney disease, stage 3 (moderate): Secondary | ICD-10-CM | POA: Diagnosis not present

## 2014-12-16 ENCOUNTER — Encounter: Payer: Self-pay | Admitting: Family

## 2014-12-16 ENCOUNTER — Ambulatory Visit: Payer: Medicare Other | Admitting: Family

## 2014-12-16 ENCOUNTER — Other Ambulatory Visit (HOSPITAL_COMMUNITY): Payer: Medicare Other

## 2014-12-18 ENCOUNTER — Other Ambulatory Visit: Payer: Self-pay | Admitting: Family

## 2014-12-18 DIAGNOSIS — I6523 Occlusion and stenosis of bilateral carotid arteries: Secondary | ICD-10-CM

## 2014-12-19 ENCOUNTER — Encounter: Payer: Self-pay | Admitting: Family

## 2014-12-19 ENCOUNTER — Ambulatory Visit (HOSPITAL_COMMUNITY)
Admission: RE | Admit: 2014-12-19 | Discharge: 2014-12-19 | Disposition: A | Discharge status: Home / Self Care | Payer: Medicare Other | Source: Ambulatory Visit | Attending: Family | Admitting: Family

## 2014-12-19 ENCOUNTER — Ambulatory Visit (INDEPENDENT_AMBULATORY_CARE_PROVIDER_SITE_OTHER): Payer: Medicare Other | Admitting: Family

## 2014-12-19 VITALS — BP 106/67 | HR 62 | Resp 16 | Ht 73.0 in | Wt 178.0 lb

## 2014-12-19 DIAGNOSIS — I6523 Occlusion and stenosis of bilateral carotid arteries: Secondary | ICD-10-CM

## 2014-12-19 NOTE — Progress Notes (Signed)
Established Carotid Patient   History of Present Illness  Max Cole is a 81 y.o. male patient of Dr. Donnetta Hutching who has known carotid artery stenosis. He returns today for follow up.  Patient has not had previous carotid artery intervention.   He walks 2 miles, 5 days/week.   Patient has no history of TIA or stroke symptom. The patient denies a history of amaurosis fugax or monocular blindness, facial drooping.  Pt. denies hemiplegia. The patient denies receptive or expressive aphasia. Pt. denies extremity weakness.  Patient denies claudication symptoms, denies non-healing wounds.  Patient denies New Medical or Surgical History.   Pt Diabetic: No  Pt smoker: non-smoker   Pt meds include:  Statin : Yes  Betablocker: Yes  ASA: Yes  Other anticoagulants/antiplatelets: no    Past Medical History  Diagnosis Date  . Cerebrovascular disease     Carotid ultrasound in 12/2010-60-79% left internal carotid artery, less than 40% or right RICA, no change  . Left bundle branch block   . Cardiomyopathy 2005    Possibly alcoholic; cath in 7341-93% ostial D1, PCW of 12, EF of 35-40%.  EF of 0.25 in 11/2003, 40-45% in 05/2004 and 25% in 10/2008 by echo  . Chronic kidney disease     Creatinine of 1.6 in 2009  . Hyperlipidemia 10/15/2011    Lipid profile in 03/2009:231, 136, 56, 148  . Carotid artery occlusion   . Prostate carcinoma   . Atrial fibrillation     Social History History  Substance Use Topics  . Smoking status: Former Smoker -- 1.00 packs/day for 30 years    Quit date: 09/28/1973  . Smokeless tobacco: Never Used  . Alcohol Use: 6.0 oz/week    5 Glasses of wine, 5 Shots of liquor per week     Comment: History of excessive alcohol use    Family History Family History  Problem Relation Age of Onset  . Hypertension Mother     Surgical History Past Surgical History  Procedure Laterality Date  . Cataract extraction      Right  . Colonoscopy w/ polypectomy   2010    Diverticulosis  . Colonoscopy N/A 08/16/2012    Procedure: COLONOSCOPY;  Surgeon: Rogene Houston, MD;  Location: [facility];  Service: Endoscopy;  Laterality: N/A;  930  . Eye surgery Right     Cataract    No Known Allergies  Current Outpatient Prescriptions  Medication Sig Dispense Refill  . aspirin 81 MG tablet Take 81 mg by mouth daily.    . metoprolol (LOPRESSOR) 50 MG tablet Take by mouth. Take 1/2 tablet daily    . metoprolol succinate (TOPROL-XL) 50 MG 24 hr tablet Take 25 mg by mouth daily. Take with or immediately following a meal.    . pravastatin (PRAVACHOL) 10 MG tablet Take 10 mg by mouth daily.    . pravastatin (PRAVACHOL) 40 MG tablet Take 20 mg by mouth daily.    . ramipril (ALTACE) 2.5 MG tablet Take by mouth. Take 1/2 tab daily     No current facility-administered medications for this visit.    Review of Systems : See HPI for pertinent positives and negatives.  Physical Examination  Filed Vitals:   12/19/14 1331  BP: 115/63  Pulse: 63  Resp: 16  Height: 6\' 1"  (1.854 m)  Weight: 178 lb (80.74 kg)   Body mass index is 23.49 kg/(m^2).  General: WDWN male in NAD  GAIT: normal  Eyes: PERRLA  Pulmonary: CTAB,  Negative Rales, Negative rhonchi, & Negative wheezing.  Cardiac: regular Rhythm, no detected murmur.   VASCULAR EXAM  Carotid Bruits  Left  Right    positive Negative    Radial pulses are 2+ palpable and equal.  Gastrointestinal: soft, nontender, BS WNL, no r/g, no palpable masses.  Musculoskeletal: Negative muscle atrophy/wasting. M/S 5/5 throughout, Extremities without ischemic changes.  Neurologic: A&O X 3; Appropriate Affect, Speech is normal  CN 2-12 intact, Pain and light touch intact in extremities, Motor exam as listed above.          Non-Invasive Vascular Imaging CAROTID DUPLEX 12/19/2014   CEREBROVASCULAR DUPLEX EVALUATION    INDICATION: Carotid stenosis    PREVIOUS INTERVENTION(S): NA    DUPLEX  EXAM:     RIGHT  LEFT  Peak Systolic Velocities (cm/s) End Diastolic Velocities (cm/s) Plaque LOCATION Peak Systolic Velocities (cm/s) End Diastolic Velocities (cm/s) Plaque  158 18  CCA PROXIMAL 149 18   106 13  CCA MID 95 14   88 13 HT CCA DISTAL 74 13 HT  150 8 HT ECA 124 9 HT  53 12 CP ICA PROXIMAL 279 67 CP  106 28  ICA MID 92 16   104 25  ICA DISTAL 103 27     0.50 ICA / CCA Ratio (PSV) 2.94  Antegrade Vertebral Flow Antegrade  161 Brachial Systolic Pressure (mmHg) 096  Triphasic Brachial Artery Waveforms Triphasic    Plaque Morphology:  HM = Homogeneous, HT = Heterogeneous, CP = Calcific Plaque, SP = Smooth Plaque, IP = Irregular Plaque  ADDITIONAL FINDINGS: Right subclavian artery PSV170cm/sec; Left subclavian artery PSV186cm/sec    IMPRESSION: Right internal carotid artery stenosis present of less than 40%. Left internal carotid artery stenosis present in the 60%-79% range.    Compared to the previous exam:  Essentially unchanged since previous study on 05/27/2014.      Assessment: Max Cole is a 81 y.o. male who has no history of stroke or TIA. Today's carotid Duplex suggests minimal right ICA stenosis and moderate/severe left ICA stenosis. Essentially unchanged since previous study on 05/27/2014.   Plan: Follow-up in 6 months with Carotid Duplex.   I discussed in depth with the patient the nature of atherosclerosis, and emphasized the importance of maximal medical management including strict control of blood pressure, blood glucose, and lipid levels, obtaining regular exercise, and continued cessation of smoking.  The patient is aware that without maximal medical management the underlying atherosclerotic disease process will progress, limiting the benefit of any interventions. The patient was given information about stroke prevention and what symptoms should prompt the patient to seek immediate medical care. Thank you for allowing [place] to participate in this  patient's care.  Clemon Chambers, RN, MSN, FNP-C [facility] Office: [phone]  Clinic Physician: Bridgett Larsson on call  12/19/2014 1:38 PM

## 2014-12-19 NOTE — Patient Instructions (Signed)
Stroke Prevention Some medical conditions and behaviors are associated with an increased chance of having a stroke. You may prevent a stroke by making healthy choices and managing medical conditions. HOW CAN I REDUCE MY RISK OF HAVING A STROKE?   Stay physically active. Get at least 30 minutes of activity on most or all days.  Do not smoke. It may also be helpful to avoid exposure to secondhand smoke.  Limit alcohol use. Moderate alcohol use is considered to be:  No more than 2 drinks per day for men.  No more than 1 drink per day for nonpregnant women.  Eat healthy foods. This involves:  Eating 5 or more servings of fruits and vegetables a day.  Making dietary changes that address high blood pressure (hypertension), high cholesterol, diabetes, or obesity.  Manage your cholesterol levels.  Making food choices that are high in fiber and low in saturated fat, trans fat, and cholesterol may control cholesterol levels.  Take any prescribed medicines to control cholesterol as directed by your health care provider.  Manage your diabetes.  Controlling your carbohydrate and sugar intake is recommended to manage diabetes.  Take any prescribed medicines to control diabetes as directed by your health care provider.  Control your hypertension.  Making food choices that are low in salt (sodium), saturated fat, trans fat, and cholesterol is recommended to manage hypertension.  Take any prescribed medicines to control hypertension as directed by your health care provider.  Maintain a healthy weight.  Reducing calorie intake and making food choices that are low in sodium, saturated fat, trans fat, and cholesterol are recommended to manage weight.  Stop drug abuse.  Avoid taking birth control pills.  Talk to your health care provider about the risks of taking birth control pills if you are over 35 years old, smoke, get migraines, or have ever had a blood clot.  Get evaluated for sleep  disorders (sleep apnea).  Talk to your health care provider about getting a sleep evaluation if you snore a lot or have excessive sleepiness.  Take medicines only as directed by your health care provider.  For some people, aspirin or blood thinners (anticoagulants) are helpful in reducing the risk of forming abnormal blood clots that can lead to stroke. If you have the irregular heart rhythm of atrial fibrillation, you should be on a blood thinner unless there is a good reason you cannot take them.  Understand all your medicine instructions.  Make sure that other conditions (such as anemia or atherosclerosis) are addressed. SEEK IMMEDIATE MEDICAL CARE IF:   You have sudden weakness or numbness of the face, arm, or leg, especially on one side of the body.  Your face or eyelid droops to one side.  You have sudden confusion.  You have trouble speaking (aphasia) or understanding.  You have sudden trouble seeing in one or both eyes.  You have sudden trouble walking.  You have dizziness.  You have a loss of balance or coordination.  You have a sudden, severe headache with no known cause.  You have new chest pain or an irregular heartbeat. Any of these symptoms may represent a serious problem that is an emergency. Do not wait to see if the symptoms will go away. Get medical help at once. Call your local emergency services (911 in [place] Do not drive yourself to the hospital. Document Released: 07/21/2004 Document Revised: 10/28/2013 Document Reviewed: 12/14/2012 Exit[employer] Patient Information 2015 [employer]. This information is not intended to replace advice given   to you by your health care provider. Make sure you discuss any questions you have with your health care provider.  

## 2014-12-19 NOTE — Addendum Note (Signed)
Addended by: Mena Goes on: 12/19/2014 04:05 PM   Modules accepted: Orders

## 2015-02-26 DIAGNOSIS — N529 Male erectile dysfunction, unspecified: Secondary | ICD-10-CM | POA: Diagnosis not present

## 2015-02-26 DIAGNOSIS — C44711 Basal cell carcinoma of skin of unspecified lower limb, including hip: Secondary | ICD-10-CM | POA: Diagnosis not present

## 2015-02-26 DIAGNOSIS — Z6823 Body mass index (BMI) 23.0-23.9, adult: Secondary | ICD-10-CM | POA: Diagnosis not present

## 2015-03-12 DIAGNOSIS — Z23 Encounter for immunization: Secondary | ICD-10-CM | POA: Diagnosis not present

## 2015-03-12 DIAGNOSIS — C44729 Squamous cell carcinoma of skin of left lower limb, including hip: Secondary | ICD-10-CM | POA: Diagnosis not present

## 2015-04-28 DIAGNOSIS — Z79899 Other long term (current) drug therapy: Secondary | ICD-10-CM | POA: Diagnosis not present

## 2015-04-28 DIAGNOSIS — I5022 Chronic systolic (congestive) heart failure: Secondary | ICD-10-CM | POA: Diagnosis not present

## 2015-04-28 DIAGNOSIS — N183 Chronic kidney disease, stage 3 (moderate): Secondary | ICD-10-CM | POA: Diagnosis not present

## 2015-05-15 DIAGNOSIS — I426 Alcoholic cardiomyopathy: Secondary | ICD-10-CM | POA: Diagnosis not present

## 2015-05-15 DIAGNOSIS — N183 Chronic kidney disease, stage 3 (moderate): Secondary | ICD-10-CM | POA: Diagnosis not present

## 2015-05-15 DIAGNOSIS — I447 Left bundle-branch block, unspecified: Secondary | ICD-10-CM | POA: Diagnosis not present

## 2015-05-15 DIAGNOSIS — E785 Hyperlipidemia, unspecified: Secondary | ICD-10-CM | POA: Diagnosis not present

## 2015-05-20 ENCOUNTER — Encounter (HOSPITAL_COMMUNITY): Payer: Self-pay | Admitting: Neurology

## 2015-05-20 ENCOUNTER — Observation Stay (HOSPITAL_COMMUNITY)
Admission: EM | Admit: 2015-05-20 | Discharge: 2015-05-21 | Disposition: A | Discharge status: Home / Self Care | Payer: Medicare Other | Attending: Cardiovascular Disease | Admitting: Cardiovascular Disease

## 2015-05-20 DIAGNOSIS — R110 Nausea: Secondary | ICD-10-CM | POA: Diagnosis not present

## 2015-05-20 DIAGNOSIS — I447 Left bundle-branch block, unspecified: Secondary | ICD-10-CM | POA: Diagnosis present

## 2015-05-20 DIAGNOSIS — Z87891 Personal history of nicotine dependence: Secondary | ICD-10-CM | POA: Insufficient documentation

## 2015-05-20 DIAGNOSIS — I679 Cerebrovascular disease, unspecified: Secondary | ICD-10-CM | POA: Insufficient documentation

## 2015-05-20 DIAGNOSIS — N189 Chronic kidney disease, unspecified: Secondary | ICD-10-CM | POA: Diagnosis not present

## 2015-05-20 DIAGNOSIS — I4891 Unspecified atrial fibrillation: Secondary | ICD-10-CM | POA: Diagnosis not present

## 2015-05-20 DIAGNOSIS — R55 Syncope and collapse: Secondary | ICD-10-CM | POA: Diagnosis not present

## 2015-05-20 DIAGNOSIS — E785 Hyperlipidemia, unspecified: Secondary | ICD-10-CM | POA: Diagnosis not present

## 2015-05-20 DIAGNOSIS — I6529 Occlusion and stenosis of unspecified carotid artery: Secondary | ICD-10-CM | POA: Insufficient documentation

## 2015-05-20 DIAGNOSIS — I5022 Chronic systolic (congestive) heart failure: Secondary | ICD-10-CM | POA: Diagnosis not present

## 2015-05-20 DIAGNOSIS — I429 Cardiomyopathy, unspecified: Secondary | ICD-10-CM

## 2015-05-20 DIAGNOSIS — Z79899 Other long term (current) drug therapy: Secondary | ICD-10-CM | POA: Diagnosis not present

## 2015-05-20 DIAGNOSIS — I2510 Atherosclerotic heart disease of native coronary artery without angina pectoris: Secondary | ICD-10-CM | POA: Insufficient documentation

## 2015-05-20 DIAGNOSIS — Z8546 Personal history of malignant neoplasm of prostate: Secondary | ICD-10-CM | POA: Insufficient documentation

## 2015-05-20 DIAGNOSIS — R42 Dizziness and giddiness: Secondary | ICD-10-CM | POA: Diagnosis not present

## 2015-05-20 DIAGNOSIS — I6523 Occlusion and stenosis of bilateral carotid arteries: Secondary | ICD-10-CM | POA: Diagnosis not present

## 2015-05-20 DIAGNOSIS — I428 Other cardiomyopathies: Secondary | ICD-10-CM

## 2015-05-20 DIAGNOSIS — R404 Transient alteration of awareness: Secondary | ICD-10-CM | POA: Diagnosis not present

## 2015-05-20 DIAGNOSIS — N183 Chronic kidney disease, stage 3 unspecified: Secondary | ICD-10-CM | POA: Diagnosis present

## 2015-05-20 DIAGNOSIS — R001 Bradycardia, unspecified: Secondary | ICD-10-CM | POA: Diagnosis present

## 2015-05-20 DIAGNOSIS — Z7982 Long term (current) use of aspirin: Secondary | ICD-10-CM | POA: Insufficient documentation

## 2015-05-20 DIAGNOSIS — I959 Hypotension, unspecified: Secondary | ICD-10-CM | POA: Diagnosis not present

## 2015-05-20 DIAGNOSIS — R0902 Hypoxemia: Secondary | ICD-10-CM | POA: Diagnosis not present

## 2015-05-20 LAB — BASIC METABOLIC PANEL
ANION GAP: 7 (ref 5–15)
BUN: 24 mg/dL — ABNORMAL HIGH (ref 6–20)
CO2: 26 mmol/L (ref 22–32)
Calcium: 8.8 mg/dL — ABNORMAL LOW (ref 8.9–10.3)
Chloride: 107 mmol/L (ref 101–111)
Creatinine, Ser: 1.69 mg/dL — ABNORMAL HIGH (ref 0.61–1.24)
GFR, EST AFRICAN AMERICAN: 41 mL/min — AB (ref >60)
GFR, EST NON AFRICAN AMERICAN: 35 mL/min — AB (ref >60)
Glucose, Bld: 134 mg/dL — ABNORMAL HIGH (ref 65–99)
POTASSIUM: 4.4 mmol/L (ref 3.5–5.1)
Sodium: 140 mmol/L (ref 135–145)

## 2015-05-20 LAB — CBC WITH DIFFERENTIAL/PLATELET
BASOS ABS: 0.1 10*3/uL (ref 0.0–0.1)
BASOS PCT: 1 %
EOS PCT: 4 %
Eosinophils Absolute: 0.2 10*3/uL (ref 0.0–0.7)
HCT: 30.8 % — ABNORMAL LOW (ref 39.0–52.0)
Hemoglobin: 10 g/dL — ABNORMAL LOW (ref 13.0–17.0)
LYMPHS PCT: 30 %
Lymphs Abs: 1.7 10*3/uL (ref 0.7–4.0)
MCH: 31.7 pg (ref 26.0–34.0)
MCHC: 32.5 g/dL (ref 30.0–36.0)
MCV: 97.8 fL (ref 78.0–100.0)
MONO ABS: 0.3 10*3/uL (ref 0.1–1.0)
Monocytes Relative: 6 %
Neutro Abs: 3.3 10*3/uL (ref 1.7–7.7)
Neutrophils Relative %: 59 %
PLATELETS: 242 10*3/uL (ref 150–400)
RBC: 3.15 MIL/uL — AB (ref 4.22–5.81)
RDW: 12.4 % (ref 11.5–15.5)
WBC: 5.6 10*3/uL (ref 4.0–10.5)

## 2015-05-20 LAB — TROPONIN I: Troponin I: <0.03 ng/mL (ref <0.031)

## 2015-05-20 LAB — I-STAT TROPONIN, ED: TROPONIN I, POC: 0 ng/mL (ref 0.00–0.08)

## 2015-05-20 MED ORDER — ENOXAPARIN SODIUM 40 MG/0.4ML ~~LOC~~ SOLN
40.0000 mg | SUBCUTANEOUS | Status: DC
  Administered 2015-05-20: 40 mg via SUBCUTANEOUS
  Filled 2015-05-20: qty 0.4

## 2015-05-20 MED ORDER — PRAVASTATIN SODIUM 20 MG PO TABS
10.0000 mg | ORAL_TABLET | Freq: Every day | ORAL | Status: DC
  Administered 2015-05-21: 10 mg via ORAL
  Filled 2015-05-20: qty 1

## 2015-05-20 MED ORDER — ACETAMINOPHEN 325 MG PO TABS: 650.0000 mg | ORAL_TABLET | Freq: Four times a day (QID) | ORAL | Status: DC | PRN

## 2015-05-20 MED ORDER — SODIUM CHLORIDE 0.9 % IV SOLN
1.0000 g | Freq: Once | INTRAVENOUS | Status: AC
  Administered 2015-05-20: 1 g via INTRAVENOUS
  Filled 2015-05-20: qty 10

## 2015-05-20 MED ORDER — ONDANSETRON HCL 4 MG PO TABS: 4.0000 mg | ORAL_TABLET | Freq: Four times a day (QID) | ORAL | Status: DC | PRN

## 2015-05-20 MED ORDER — ASPIRIN EC 81 MG PO TBEC
81.0000 mg | DELAYED_RELEASE_TABLET | Freq: Every day | ORAL | Status: DC
  Administered 2015-05-21: 81 mg via ORAL
  Filled 2015-05-20: qty 1

## 2015-05-20 MED ORDER — SODIUM CHLORIDE 0.9 % IJ SOLN
3.0000 mL | Freq: Two times a day (BID) | INTRAMUSCULAR | Status: DC
  Administered 2015-05-20 – 2015-05-21 (×2): 3 mL via INTRAVENOUS

## 2015-05-20 MED ORDER — RAMIPRIL 1.25 MG PO CAPS
1.2500 mg | ORAL_CAPSULE | Freq: Every day | ORAL | Status: DC
  Administered 2015-05-21: 1.25 mg via ORAL
  Filled 2015-05-20: qty 1

## 2015-05-20 NOTE — ED Provider Notes (Signed)
CSN: [id number]     Arrival date & time 05/20/15  1104 History   First MD Initiated Contact with Patient 05/20/15 1110     Chief Complaint  Patient presents with  . Loss of Consciousness     HPI  Patient presents for evaluation after syncope.  He has a history of a nonischemic cardiomyopathy with EF of 20-25 on echo in 2014. He also had a cath that showed nonocclusive coronary artery disease i 2012. He states he was told to stop "drinking and smoking". He stopped smoking. He stopped drinking hard liquor. He states "one year later" it was "better". He states he does occasionally drink wine. He had a family wedding this weekend and drank "a bit more wine" than usual. He felt well yesterday and went to the gym.  Today he was at the gym. He did leg presses, step-ups, when he began to feel lightheaded. He sat down.  He denies pain, nausea, or any other noxious stimuli. His friend sat next to him and told him that he did not look well and was sweating. He had a syncopal event. He recovered and felt nauseated and vomited. He then had a second syncopal event. He recovered. He was bradycardic with pulse of 45, hypotensive with pressure of 90. Given saline 800 mL. His blood pressure has improved. He is asymptomatic upon arrival.  Denies any discomfort in his chest neck back jaw or abdomen. Does not feel short of breath. He does walking and "cardio" at the gym each day without limiting symptoms.   Cath 2012:   CONCLUSIONS: 1. Minimal nonobstructive coronary artery disease with 50% narrowing in the  ostium of the first diagonal branch of the left anterior descending. 2. Left ventricular dysfunction with global hypokinesis with an estimated  ejection fraction of 35-40%.  RECOMMENDATIONS: The patient appears to have a nonischemic cardiomyopathy. The etiology is not clear. He does have a history of some alcohol and this is a possibility. Will caution him against this and  continue treatment with Altace and arrange followup with Dr. Lattie Haw. He has bradycardia so he is probably not a candidate for beta blocker therapy. His ECG has left bundle branch block and his symptoms do not respond to medical treatment he may be a candidate for a bi-V pacer.  Echo 08/21/12:  - Left ventricle: The cavity size was mildly dilated. Mild septal hypertrophy present. Systolic function was severely reduced. The estimated ejection fraction was in the range of 20% to 25%. The septum was severely hypokinetic. All other segments were moderately to severely hypokinetic. - Ventricular septum: Septal motion showed mild paradox. - Aortic valve: Mild regurgitation. - Mitral valve: Mild regurgitation.  Past Medical History  Diagnosis Date  . Cerebrovascular disease     Carotid ultrasound in 12/2010-60-79% left internal carotid artery, less than 40% or right RICA, no change  . Left bundle branch block   . Cardiomyopathy 2005    Possibly alcoholic; cath in 0000000 ostial D1, PCW of 12, EF of 35-40%.  EF of 0.25 in 11/2003, 40-45% in 05/2004 and 25% in 10/2008 by echo  . Chronic kidney disease     Creatinine of 1.6 in 2009  . Hyperlipidemia 10/15/2011    Lipid profile in 03/2009:231, 136, 56, 148  . Carotid artery occlusion   . Prostate carcinoma ([place])   . Atrial fibrillation [facility])    Past Surgical History  Procedure Laterality Date  . Cataract extraction      Right  . Colonoscopy  w/ polypectomy  2010    Diverticulosis  . Colonoscopy N/A 08/16/2012    Procedure: COLONOSCOPY;  Surgeon: Rogene Houston, MD;  Location: [facility];  Service: Endoscopy;  Laterality: N/A;  930  . Eye surgery Right     Cataract   Family History  Problem Relation Age of Onset  . Hypertension Mother    Social History  Substance Use Topics  . Smoking status: Former Smoker -- 1.00 packs/day for 30 years    Quit date: 09/28/1973  . Smokeless tobacco: Never Used  . Alcohol Use: 3.0  oz/week    5 Glasses of wine per week     Comment: History of excessive alcohol use; Alternates between glass of wine vs. Liquor drink 5 days per week (04/2015)    Review of Systems  Constitutional: Negative for fever, chills, diaphoresis, appetite change and fatigue.  HENT: Negative for mouth sores, sore throat and trouble swallowing.   Eyes: Negative for visual disturbance.  Respiratory: Negative for cough, chest tightness, shortness of breath and wheezing.   Cardiovascular: Negative for chest pain.  Gastrointestinal: Positive for nausea. Negative for vomiting, abdominal pain, diarrhea and abdominal distention.  Endocrine: Negative for polydipsia, polyphagia and polyuria.  Genitourinary: Negative for dysuria, frequency and hematuria.  Musculoskeletal: Negative for gait problem.  Skin: Negative for color change, pallor and rash.  Neurological: Positive for syncope. Negative for dizziness, light-headedness and headaches.  Hematological: Does not bruise/bleed easily.  Psychiatric/Behavioral: Negative for behavioral problems and confusion.      Allergies  Review of patient's allergies indicates no known allergies.  Home Medications   Prior to Admission medications   Medication Sig Start Date End Date Taking? Authorizing Provider  aspirin 81 MG tablet Take 81 mg by mouth daily.   Yes Historical Provider, MD  metoprolol succinate (TOPROL-XL) 50 MG 24 hr tablet Take 25 mg by mouth daily. Take with or immediately following a meal.   Yes Historical Provider, MD  pravastatin (PRAVACHOL) 10 MG tablet Take 10 mg by mouth daily.   Yes Historical Provider, MD  ramipril (ALTACE) 2.5 MG tablet Take 1.25 mg by mouth daily. Take 1/2 tab daily   Yes Historical Provider, MD   BP 140/66 mmHg  Pulse 61  Temp(Src) 98.4 F (36.9 C) (Oral)  Resp 16  Ht 6' (1.829 m)  Wt 170 lb 9.6 oz (77.384 kg)  BMI 23.13 kg/m2  SpO2 96% Physical Exam  Constitutional: He is oriented to person, place, and time.  He appears well-developed and well-nourished. No distress.  HENT:  Head: Normocephalic.  Eyes: Conjunctivae are normal. Pupils are equal, round, and reactive to light. No scleral icterus.  Neck: Normal range of motion. Neck supple. No thyromegaly present.  Cardiovascular: Normal rate and regular rhythm.  Exam reveals no gallop and no friction rub.   No murmur heard. Sinus bradycardia rate of 45. No appreciable murmur on exam.  Pulmonary/Chest: Effort normal and breath sounds normal. No respiratory distress. He has no wheezes. He has no rales.  Abdominal: Soft. Bowel sounds are normal. He exhibits no distension. There is no tenderness. There is no rebound.  Musculoskeletal: Normal range of motion.  Neurological: He is alert and oriented to person, place, and time.  Skin: Skin is warm and dry. No rash noted.  Psychiatric: He has a normal mood and affect. His behavior is normal.    ED Course  Procedures (including critical care time) Labs Review Labs Reviewed  CBC WITH DIFFERENTIAL/PLATELET - Abnormal; Notable for  the following:    RBC 3.15 (*)    Hemoglobin 10.0 (*)    HCT 30.8 (*)    All other components within normal limits  BASIC METABOLIC PANEL - Abnormal; Notable for the following:    Glucose, Bld 134 (*)    BUN 24 (*)    Creatinine, Ser 1.69 (*)    Calcium 8.8 (*)    GFR calc non Af Amer 35 (*)    GFR calc Af Amer 41 (*)    All other components within normal limits  BASIC METABOLIC PANEL - Abnormal; Notable for the following:    BUN 25 (*)    Creatinine, Ser 1.66 (*)    GFR calc non Af Amer 36 (*)    GFR calc Af Amer 42 (*)    All other components within normal limits  TROPONIN I  TROPONIN I  TROPONIN I  I-STAT TROPOININ, ED    Imaging Review No results found. I have personally reviewed and evaluated these images and lab results as part of my medical decision-making.   EKG Interpretation None      MDM   Final diagnoses:  Syncope and collapse    History  of NICM and bradycardia. He is on a beta blocker that is not new to him. No history, or physical exam findings to suggest aortic stenosis. Differential would include arrhythmia, ACS, aortic stenosis, etc.  EKG shows left bundle branch block pattern that has been present since 2013. Sinus bradycardia with rate of 43. Patient is on low-dose beta blocker Toprol 25 mg daily, not a new medication or recently changed.    Tanna Furry, MD 05/21/15 [phone]

## 2015-05-20 NOTE — H&P (Signed)
CARDIOLOGY CONSULT NOTE   Patient ID: Max Cole MRN: [id number], DOB/AGE: [birth year]   Admit date: 05/20/2015 Date of Admission: 05/20/2015   Primary Physician: Asencion Noble, MD Primary Cardiologist: Use to see Dr. Lattie Haw (last visit 2014)  HPI: Max Cole is a 81 y.o. male with past medical history of nonischemic cardiomyopathy (possible alcohol induced), CKD, HLD, LBBB, and Carotid Artery Stenosis (left ICA stenosis 60-79%, < 40% on Right in 11/2014) who presents to [facility] ED on 05/20/2015 following a syncopal event this morning.  Patient reports he was sitting in a chair lifting arm weights after having just finished the stair climber when he all of a sudden felt dizzy. One of his friends noted he looked pale and after that he lost consciousness. He is unsure of how long he lost consciousness, but believes it was for less than one minute. He felt nauseated after the event and vomited once. He did not hit his head, for he was sitting down when the event occurred. He denies any seizure like-activity or loss of bowel or bladder incontinence. He reports only ever having two syncopal events in his life (once in 2005 when he was diagnosed with nonischemic cardiomyopathy and again a few years later).  He reports doing the same workout routine 5 days per week which consists of aerobic activity, lifting weights, doing squats, and climbing stairs. He reports feeling lightheaded occasionally with this activity but denies having ever had syncope until today. Reports he usually stops and rests for a few minutes and the symptoms resolve.  Upon EMS arriving, he was diaphoretic and his HR was 40. His blood pressure was noted to be 90/47 and he was given 810mL of NS. While in the ED, his HR has ranged from 38 - 55. BP has been stable ranging from 106/46 - 140/75.  He currently takes ASA 81mg  daily, Metoprolol (patient is unsure if short-acting or long-acting) 25mg  daily, Pravastatin 10mg  daily  and Altace 1.25mg  daily. He reports taking his Metoprolol and Altace this morning prior to going to the gym. He reports his HR is usually in the 70's - 80's when checked at home.  The patient is followed by Dr. Donnetta Hutching in regards to his carotid artery stenosis. He was last seen in the office by them in 11/2014 and medical management was recommended at that time with follow-up carotid duplex studies in 6 months. He was last seen by Dr. Lattie Haw in 09/2012. It was recommended he see EP at that time for AICD evaluation but it appears this has not been done since that visit. Says he has not seen a cardiologist since 2014.  His last echocardiogram was in 2014 and showed an EF of 20% to 25%. The septum was severely hypokinetic and all other segments were moderately to severely hypokinetic. His last catheterization was in 2005 and showed minimal nonobstructive CAD with 50% narrowing in the ostium of D1.  Reports still consuming a glass of wine or hard liquor five nights per week. Says this is significantly less than he use to consume. Is a "social smoker" in regards to tobacco use. Denies illicit drug use.   Problem List  Past Medical History  Diagnosis Date  . Cerebrovascular disease     Carotid ultrasound in 12/2010-60-79% left internal carotid artery, less than 40% or right RICA, no change  . Left bundle branch block   . Cardiomyopathy 2005    Possibly alcoholic; cath in 0000000 ostial D1, PCW of 12, EF of  35-40%.  EF of 0.25 in 11/2003, 40-45% in 05/2004 and 25% in 10/2008 by echo  . Chronic kidney disease     Creatinine of 1.6 in 2009  . Hyperlipidemia 10/15/2011    Lipid profile in 03/2009:231, 136, 56, 148  . Carotid artery occlusion   . Prostate carcinoma ([place])   . Atrial fibrillation [facility])     Past Surgical History  Procedure Laterality Date  . Cataract extraction      Right  . Colonoscopy w/ polypectomy  2010    Diverticulosis  . Colonoscopy N/A 08/16/2012    Procedure: COLONOSCOPY;   Surgeon: Rogene Houston, MD;  Location: [facility];  Service: Endoscopy;  Laterality: N/A;  930  . Eye surgery Right     Cataract     Allergies No Known Allergies    Inpatient Medications    Family History Family History  Problem Relation Age of Onset  . Hypertension Mother      Social History Social History   Social History  . Marital Status: Married    Spouse Name: N/A  . Number of Children: N/A  . Years of Education: N/A   Occupational History  . Not on file.   Social History Main Topics  . Smoking status: Former Smoker -- 1.00 packs/day for 30 years    Quit date: 09/28/1973  . Smokeless tobacco: Never Used  . Alcohol Use: 3.0 oz/week    5 Glasses of wine per week     Comment: History of excessive alcohol use; Alternates between glass of wine vs. Liquor drink 5 days per week (04/2015)  . Drug Use: No  . Sexual Activity: Not on file   Other Topics Concern  . Not on file   Social History Narrative     Review of Systems General:  No chills, fever, night sweats or weight changes.  Cardiovascular:  No chest pain, dyspnea on exertion, edema, orthopnea, palpitations, paroxysmal nocturnal dyspnea. Dermatological: No rash, lesions/masses Respiratory: No cough, dyspnea Urologic: No hematuria, dysuria Abdominal:   No diarrhea, bright red blood per rectum, melena, or hematemesis. Positive for nausea and vomiting. Neurologic:  No visual changes, wkns, changes in mental status. Positive for syncope. All other systems reviewed and are otherwise negative except as noted above.  Physical Exam Blood pressure 133/56, pulse 48, temperature 97.5 F (36.4 C), temperature source Oral, resp. rate 13, height 6' (1.829 m), weight 175 lb (79.379 kg), SpO2 100 %.  General: Pleasant, Caucasian male in NAD Psych: Normal affect. Neuro: Alert and oriented X 3. Moves all extremities spontaneously. HEENT: Normal  Neck: Supple without JVD. Bilateral bruit present, with left  greater than the right. Lungs:  Resp regular and unlabored, CTA without wheezing or rales. Heart: Regular rhythm, bradycardiac rate, no s3, s4, or murmurs. Abdomen: Soft, non-tender, non-distended, BS + x 4.  Extremities: No clubbing, cyanosis or edema. DP/PT/Radials 2+ and equal bilaterally.  Labs  No results for input(s): CKTOTAL, CKMB, TROPONINI in the last 72 hours. Lab Results  Component Value Date   WBC 5.6 05/20/2015   HGB 10.0* 05/20/2015   HCT 30.8* 05/20/2015   MCV 97.8 05/20/2015   PLT 242 05/20/2015     Recent Labs Lab 05/20/15 1120  NA 140  K 4.4  CL 107  CO2 26  BUN 24*  CREATININE 1.69*  CALCIUM 8.8*  GLUCOSE 134*   Lab Results  Component Value Date   CHOL 205 02/26/2009   HDL 48 02/26/2009   LDLCALC 144  02/26/2009   TRIG 67 02/26/2009    ECG: Sinus bradycardia with rate in 40's. Known LBBB.  ECHOCARDIOGRAM: 09/2012 Study Conclusions - Left ventricle: The cavity size was mildly dilated. Mild septal hypertrophy present. Systolic function was severely reduced. The estimated ejection fraction was in the range of 20% to 25%. The septum was severely hypokinetic. All other segments were moderately to severely hypokinetic. - Ventricular septum: Septal motion showed mild paradox. - Aortic valve: Mild regurgitation. - Mitral valve: Mild regurgitation. - Atrial septum: No defect or patent foramen ovale was identified. 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 Transthoracic echocardiography. M-mode, complete 2D, spectral Doppler, and color Doppler. Height: Height: 185.4cm. Height: 73in. Weight: Weight: 81.6kg. Weight: 179.6lb. Body mass index: BMI: 23.7kg/m^2. Body surface area:  BSA: 2.51m^2. Blood pressure:   122/58. Patient status: Outpatient. Location: Echo laboratory.  ASSESSMENT AND PLAN  1. Syncopal Event in setting of bradycardia and hypotension - experienced LOC while lifting weights this morning. Sitting in chair  at time of event. Denies any seizure like activity or loss of bowel/bladder function. Prodromal feeling of lightheadedness. Does a similar workout 5x/week and has been experiencing lightheadedness, but usually resolves with rest.  - HR in 40's and BP 90/47 when EMS arrived. Administered 866mL NS. BP now normalized. HR has been ranging from 38-55 while in the ED - will admit to telemetry. - cycle cardiac enzymes. - will administer calcium gluconate in the ED to reverse his beta-blockade. - Spoke to Dr. Lovena Le (Electrophysiology) about the patient. He recommended if the patient's HR remains stable off his BB, then he can likely be seen as an outpatient for consideration of CRT-P vs. CRT-D. If his HR drops in the 30's again, then they will see him in consult tomorrow.  2. Bradycardia - patient reports his HR is usually in the 80's.  - HR has been 38- 55 while in the ED. - will stop Metoprolol at this time.  3. Bilateral Carotid Artery Stenosis - left ICA stenosis 60-79%, < 40% on Right in 11/2014 - followed by Dr. Donnetta Hutching ([facility])  4. Nonischemic Cardiomyopathy - last echocardiogram was in 2014 and showed an EF of 20% to 25%. The septum was severely hypokinetic and all other segments were moderately to severely hypokinetic. - last catheterization was in 2005 and showed minimal nonobstructive CAD with 50% narrowing in the ostium of D1. - on BB and ACE-I prior to admission. Will discontinue BB at this time. - obtain new echocardiogram  5. HLD - check lipid panel - continue statin  6. Stage 3 CKD - baseline around 1.6 - creatinine 1.69 on 05/20/2015 - continue to monitor with BMET.  Signed, Erma Heritage, PA-C 05/20/2015, 2:56 PM Pager: [phone]

## 2015-05-20 NOTE — ED Notes (Signed)
Cardiology PA to bedside to see patient. Reports pt can drink. Given water. Pt is a x 4. Denies pain. Waiting for cardiology MD,

## 2015-05-20 NOTE — ED Notes (Signed)
Per ems- Pt was working out and had just finished doing steps when he felt lightheaded and sat down. He had 2 episodes of syncope, vomiting x 1. Pt did not fall. When EMS arrived, pt diaphoretic and was alert. BP 90/47, HR 40, given 4 mg zofran and 800 NS. LBBB on EKG, denies cp. Is a x 4, BP 110/50.

## 2015-05-21 ENCOUNTER — Observation Stay (HOSPITAL_BASED_OUTPATIENT_CLINIC_OR_DEPARTMENT_OTHER): Payer: Medicare Other

## 2015-05-21 ENCOUNTER — Encounter (HOSPITAL_COMMUNITY): Payer: Self-pay | Admitting: Nurse Practitioner

## 2015-05-21 DIAGNOSIS — I5022 Chronic systolic (congestive) heart failure: Secondary | ICD-10-CM

## 2015-05-21 DIAGNOSIS — R55 Syncope and collapse: Secondary | ICD-10-CM

## 2015-05-21 DIAGNOSIS — R001 Bradycardia, unspecified: Secondary | ICD-10-CM | POA: Diagnosis not present

## 2015-05-21 DIAGNOSIS — I6523 Occlusion and stenosis of bilateral carotid arteries: Secondary | ICD-10-CM | POA: Diagnosis not present

## 2015-05-21 DIAGNOSIS — I429 Cardiomyopathy, unspecified: Secondary | ICD-10-CM | POA: Diagnosis not present

## 2015-05-21 DIAGNOSIS — N183 Chronic kidney disease, stage 3 (moderate): Secondary | ICD-10-CM

## 2015-05-21 LAB — BASIC METABOLIC PANEL
ANION GAP: 6 (ref 5–15)
BUN: 25 mg/dL — ABNORMAL HIGH (ref 6–20)
CALCIUM: 8.9 mg/dL (ref 8.9–10.3)
CO2: 27 mmol/L (ref 22–32)
CREATININE: 1.66 mg/dL — AB (ref 0.61–1.24)
Chloride: 107 mmol/L (ref 101–111)
GFR, EST AFRICAN AMERICAN: 42 mL/min — AB (ref >60)
GFR, EST NON AFRICAN AMERICAN: 36 mL/min — AB (ref >60)
Glucose, Bld: 90 mg/dL (ref 65–99)
Potassium: 4.2 mmol/L (ref 3.5–5.1)
SODIUM: 140 mmol/L (ref 135–145)

## 2015-05-21 LAB — TROPONIN I: Troponin I: <0.03 ng/mL (ref <0.031)

## 2015-05-21 NOTE — Progress Notes (Signed)
PATIENT ID:  37M with with chronic systolic heart failure (NICM 2/2 EtOH, LVEF 20-25%), HTN, LBBB, and carotid stenosis admitted with sinus bradycardia and syncope.  INTERVAL HISTORY: Beta blocker was held.  HR >60 since 4 pm.  SUBJECTIVE: Feels well.  Denies chest pain, shortness of breath, lightheadedness or dizziness.   PHYSICAL EXAM Filed Vitals:   05/20/15 1625 05/20/15 1952 05/21/15 0024 05/21/15 0425  BP: 158/52 125/57 129/50 140/66  Pulse: 57 69 67 61  Temp: 98 F (36.7 C) 98.7 F (37.1 C) 98 F (36.7 C) 98.4 F (36.9 C)  TempSrc: Oral Oral Oral Oral  Resp:  16 16 16   Height:      Weight:    77.384 kg (170 lb 9.6 oz)  SpO2: 100% 96% 96% 96%   General:  Well-appearing.  NAD. Neck:  No JVD Lungs:  CTAB.  No crackles, rhonhci or wheezes Heart:  RRR.  No m/r/g.  Nl S1/S2 Abdomen:  Soft, NT, ND. Extremities:  WWP.  No edema.  LABS: Lab Results  Component Value Date   TROPONINI <0.03 05/21/2015   Results for orders placed or performed during the hospital encounter of 05/20/15 (from the past 24 hour(s))  CBC with Differential/Platelet     Status: Abnormal   Collection Time: 05/20/15 11:20 AM  Result Value Ref Range   WBC 5.6 4.0 - 10.5 K/uL   RBC 3.15 (L) 4.22 - 5.81 MIL/uL   Hemoglobin 10.0 (L) 13.0 - 17.0 g/dL   HCT 30.8 (L) 39.0 - 52.0 %   MCV 97.8 78.0 - 100.0 fL   MCH 31.7 26.0 - 34.0 pg   MCHC 32.5 30.0 - 36.0 g/dL   RDW 12.4 11.5 - 15.5 %   Platelets 242 150 - 400 K/uL   Neutrophils Relative % 59 %   Neutro Abs 3.3 1.7 - 7.7 K/uL   Lymphocytes Relative 30 %   Lymphs Abs 1.7 0.7 - 4.0 K/uL   Monocytes Relative 6 %   Monocytes Absolute 0.3 0.1 - 1.0 K/uL   Eosinophils Relative 4 %   Eosinophils Absolute 0.2 0.0 - 0.7 K/uL   Basophils Relative 1 %   Basophils Absolute 0.1 0.0 - 0.1 K/uL  Basic metabolic panel     Status: Abnormal   Collection Time: 05/20/15 11:20 AM  Result Value Ref Range   Sodium 140 135 - 145 mmol/L   Potassium 4.4 3.5 - 5.1  mmol/L   Chloride 107 101 - 111 mmol/L   CO2 26 22 - 32 mmol/L   Glucose, Bld 134 (H) 65 - 99 mg/dL   BUN 24 (H) 6 - 20 mg/dL   Creatinine, Ser 1.69 (H) 0.61 - 1.24 mg/dL   Calcium 8.8 (L) 8.9 - 10.3 mg/dL   GFR calc non Af Amer 35 (L) >60 mL/min   GFR calc Af Amer 41 (L) >60 mL/min   Anion gap 7 5 - 15  I-stat troponin, ED     Status: None   Collection Time: 05/20/15 11:23 AM  Result Value Ref Range   Troponin i, poc 0.00 0.00 - 0.08 ng/mL   Comment 3          Troponin I     Status: None   Collection Time: 05/20/15  5:12 PM  Result Value Ref Range   Troponin I <0.03 <0.031 ng/mL  Troponin I     Status: None   Collection Time: 05/20/15  9:42 PM  Result Value Ref Range   Troponin  I <0.03 <0.031 ng/mL  Troponin I     Status: None   Collection Time: 05/21/15  3:52 AM  Result Value Ref Range   Troponin I <0.03 <0.031 ng/mL  Basic metabolic panel     Status: Abnormal   Collection Time: 05/21/15  3:52 AM  Result Value Ref Range   Sodium 140 135 - 145 mmol/L   Potassium 4.2 3.5 - 5.1 mmol/L   Chloride 107 101 - 111 mmol/L   CO2 27 22 - 32 mmol/L   Glucose, Bld 90 65 - 99 mg/dL   BUN 25 (H) 6 - 20 mg/dL   Creatinine, Ser 1.66 (H) 0.61 - 1.24 mg/dL   Calcium 8.9 8.9 - 10.3 mg/dL   GFR calc non Af Amer 36 (L) >60 mL/min   GFR calc Af Amer 42 (L) >60 mL/min   Anion gap 6 5 - 15    Intake/Output Summary (Last 24 hours) at 05/21/15 0816 Last data filed at 05/21/15 [id number]  Gross per 24 hour  Intake    240 ml  Output    700 ml  Net   -460 ml    EKG:  11/23: Sinus bradycardia rate 43 BPM.  LBBB.  Telemetry: Sinus bradycardia yesterday, now sinus rhythm in the 60s.  Occasional polymorphic PVCs  ASSESSMENT AND PLAN:  Active Problems:   Cerebrovascular disease   Cardiomyopathy, nonischemic (HCC)   CKD (chronic kidney disease) stage 3, GFR 30-59 ml/min   Carotid stenosis, bilateral   Syncope   Nonischemic cardiomyopathy ([place])   1. Syncopal Event in setting of bradycardia  and hypotension: Syncope while lifting weights and was found to be bradycardic, HR in 40s, BP 90/47.  Resolved after holding metoprolol.  HR now in the 60s and he is asymptomatic.  Troponin is negative x3.  - Echo pending - Discharge after echo - Continue to hold beta blocker - Outpatient EP follow up for consideration of CRT   2.  Chronic systolic heart failure/Nonischemic Cardiomyopathy:  Last echocardiogram was in 2014 and showed an EF of 20% to 25%. The septum was severely hypokinetic and all other segments were moderately to severely hypokinetic.  Thought to be related to alcohol abuse.  Overall he has been doing much better with alcohol, but recently drank heavily for his granddaughter's wedding last week.  He is euvolemic and able to exercise daily with minimal symptoms. - Echo as above - Continue home ramipril - last catheterization was in 2005 and showed minimal nonobstructive CAD with 50% narrowing in the ostium of D1.  3. Bilateral Carotid Artery Stenosis - left ICA stenosis 60-79%, < 40% on Right in 11/2014 - followed by Dr. Donnetta Hutching ([facility]) - Continue aspirin and pravastatin  4. HL: Continue pravastatin  5. Stage 3 CKD: Stable.  Creatinine 1.6.   Sharol Harness, MD 05/21/2015 8:16 AM

## 2015-05-21 NOTE — Discharge Instructions (Signed)
***  PLEASE REMEMBER TO BRING ALL OF YOUR MEDICATIONS TO EACH OF YOUR FOLLOW-UP OFFICE VISITS.  

## 2015-05-21 NOTE — Discharge Summary (Signed)
Discharge Summary   Patient ID: Max Cole,  MRN: [id number], DOB/AGE: 79-Jan-1929 79 y.o.  Admit date: 05/20/2015 Discharge date: 05/21/2015  Primary Care Provider: [facility] Primary Cardiologist: previously R. Lattie Haw, MD   Discharge Diagnoses Principal Problem:   Syncope Active Problems:   Cardiomyopathy, nonischemic (HCC)   Nonischemic cardiomyopathy (HCC)   Bradycardia   Cerebrovascular disease   Carotid stenosis, bilateral   Left bundle branch block   CKD (chronic kidney disease) stage 3, GFR 30-59 ml/min   Hyperlipidemia   Allergies No Known Allergies  Procedures  2D Echocardiogram 11.24.2016  Study Conclusions  - Left ventricle: The cavity size was normal. There was moderate   concentric hypertrophy. Systolic function was mildly to   moderately reduced. The estimated ejection fraction was in the   range of 40% to 45%. Diffuse hypokinesis. There is severe   hypokinesis of the entireinferolateral and inferior myocardium.   There was an increased relative contribution of atrial   contraction to ventricular filling. Doppler parameters are   consistent with abnormal left ventricular relaxation (grade 1   diastolic dysfunction). - Ventricular septum: Septal motion showed paradox. - Aortic valve: Moderate diffuse thickening and calcification,   consistent with sclerosis. There was mild regurgitation. - Tricuspid valve: There was trivial regurgitation. _____________   History of Present Illness  81 y/o male with a prior h/o non-ischemic cardiomyopathy, bilateral carotid stenosis, and CKD who was in his usual state of health until 11/23, when performing his usual exercise routine (lifting light dumbbells while sitting), he developed dizziness, diaphoresis, and pallor followed by loss of consciousness.  Patient believes he regained consciousness in under a minute.  Following regaining consciousness, he became nauseated and vomited.  EMS was called and he was  found to be bradycardic (40) and hypotensive (90/47).  He was taken to [facility] ED and aggressively hydrated.  While in the ED, he remained bradycardic with rates from 38-55.  BP stabilized and he had no further presyncope/syncope.  He was admitted for further evaluation.  Hospital Course  Upon admission, his home dose of beta blocker therapy was held.  Throughout the remainder of the day on 11/23 and subsequently 11/24, heart rates remained above 60 beats/minute.  He had no further presyncope/syncope and troponins remained normal.  His case was discussed with electrophysiology and recommendation was made for outpatient EP f/u to consider CRT-P vs CRT-D in the setting of syncope, baseline bradycardia, LBBB, and NICM.  2D echocardiography has been performed today and revealed and EF of 40-45% - up from 25% in 2010.  He will be discharged home today in good condition.  Discharge Vitals Blood pressure 140/66, pulse 61, temperature 98.4 F (36.9 C), temperature source Oral, resp. rate 16, height 6' (1.829 m), weight 170 lb 9.6 oz (77.384 kg), SpO2 96 %.  Filed Weights   05/20/15 1107 05/20/15 1600 05/21/15 0425  Weight: 175 lb (79.379 kg) 170 lb 6.4 oz (77.293 kg) 170 lb 9.6 oz (77.384 kg)    Labs  CBC  Recent Labs  05/20/15 1120  WBC 5.6  NEUTROABS 3.3  HGB 10.0*  HCT 30.8*  MCV 97.8  PLT [id number]   Basic Metabolic Panel  Recent Labs  05/20/15 1120 05/21/15 0352  NA 140 140  K 4.4 4.2  CL 107 107  CO2 26 27  GLUCOSE 134* 90  BUN 24* 25*  CREATININE 1.69* 1.66*  CALCIUM 8.8* 8.9   Cardiac Enzymes  Recent Labs  05/20/15 1712 05/20/15 2142 05/21/15 0352  TROPONINI <0.03 <0.03 <0.03   Disposition  Pt is being discharged home today in good condition.  Follow-up Plans & Appointments      Follow-up Information    Follow up with Cristopher Peru, MD.   Specialty:  Cardiology   Why:  we will arrange for follow-up and contact you.   Contact information:   [place] [address] [phone]       Follow up with Asencion Noble, MD.   Specialty:  Internal Medicine   Why:  as scheduled.   Contact information:   [address] [place] [address] [phone]       Discharge Medications    Medication List    STOP taking these medications        metoprolol succinate 50 MG 24 hr tablet  Commonly known as:  TOPROL-XL      TAKE these medications        aspirin 81 MG tablet  Take 81 mg by mouth daily.     pravastatin 10 MG tablet  Commonly known as:  PRAVACHOL  Take 10 mg by mouth daily.     ramipril 2.5 MG tablet  Commonly known as:  ALTACE  Take 1.25 mg by mouth daily. Take 1/2 tab daily       Outstanding Labs/Studies  Echo performed - result pending @ this time.  Duration of Discharge Encounter   Greater than 30 minutes including physician time.  Signed, Murray Hodgkins NP 05/21/2015, 10:05 AM

## 2015-05-21 NOTE — Progress Notes (Signed)
  Echocardiogram 2D Echocardiogram has been performed.  [name] 05/21/2015, 10:41 AM

## 2015-05-25 DIAGNOSIS — R350 Frequency of micturition: Secondary | ICD-10-CM | POA: Diagnosis not present

## 2015-05-25 DIAGNOSIS — C61 Malignant neoplasm of prostate: Secondary | ICD-10-CM | POA: Diagnosis not present

## 2015-05-25 DIAGNOSIS — N5201 Erectile dysfunction due to arterial insufficiency: Secondary | ICD-10-CM | POA: Diagnosis not present

## 2015-06-09 ENCOUNTER — Ambulatory Visit (INDEPENDENT_AMBULATORY_CARE_PROVIDER_SITE_OTHER): Payer: Medicare Other | Admitting: Internal Medicine

## 2015-06-09 ENCOUNTER — Encounter: Payer: Self-pay | Admitting: *Deleted

## 2015-06-09 ENCOUNTER — Encounter: Payer: Self-pay | Admitting: Internal Medicine

## 2015-06-09 VITALS — BP 142/70 | HR 72 | Ht 73.0 in | Wt 175.0 lb

## 2015-06-09 DIAGNOSIS — I428 Other cardiomyopathies: Secondary | ICD-10-CM

## 2015-06-09 DIAGNOSIS — R55 Syncope and collapse: Secondary | ICD-10-CM | POA: Diagnosis not present

## 2015-06-09 DIAGNOSIS — I429 Cardiomyopathy, unspecified: Secondary | ICD-10-CM | POA: Diagnosis not present

## 2015-06-09 DIAGNOSIS — R001 Bradycardia, unspecified: Secondary | ICD-10-CM | POA: Diagnosis not present

## 2015-06-09 NOTE — Assessment & Plan Note (Signed)
He has sinus node dysfunction and his beta blocker is stopped. Will assess for chronotropic incompetence.

## 2015-06-09 NOTE — Addendum Note (Signed)
Addended by: Levonne Hubert on: 06/09/2015 01:23 PM   Modules accepted: Orders

## 2015-06-09 NOTE — Assessment & Plan Note (Signed)
He has class 2 hear failure. He does not have obstructive CAD. His EF has been worse in the past. I suspect ETOH is the etiology and I have recommended he stop drinking or at least stop his tendency at times to binge drink.

## 2015-06-09 NOTE — Progress Notes (Signed)
HPI [name] is referred today for evaluation of syncope. He was hospitalized several weeks ago with syncope. He was noted to be bradycardic and hypotensive. His beta blocker was stopped. He was observed and his heart rate improved. He was discharged off of his beta blocker. He has LV dysfunction with an EF of 40% and also has LBBB. The patient notes a total of three episodes of syncope with the first 2 occuring while he was working in the yard. No loss of bowel or bladder continence. He did not bite his tongue. He has class 2A heart failure symptoms and he is able to do most anything he wants.  No Known Allergies   Current Outpatient Prescriptions  Medication Sig Dispense Refill  . aspirin 81 MG tablet Take 81 mg by mouth daily.    . pravastatin (PRAVACHOL) 10 MG tablet Take 10 mg by mouth daily.    . ramipril (ALTACE) 2.5 MG tablet Take 1.25 mg by mouth daily. Take 1/2 tab daily     No current facility-administered medications for this visit.     Past Medical History  Diagnosis Date  . Cerebrovascular disease     Carotid ultrasound in 12/2010-60-79% left internal carotid artery, less than 40% or right RICA, no change  . Left bundle branch block   . Cardiomyopathy 2005    a. Possibly alcoholic; b. cath in 0000000 ostial D1, PCW of 12, EF of 35-40%;  c. EF of 0.25 in 11/2003;  d. 40-45% in 05/2004;  e. 25% in 10/2008 by echo;  f. 04/2015 Echo: EF 40-45%, diff HK, sev inflat/inf HK, Gr 1 DD, mild AI, triv TR.  Marland Kitchen Chronic kidney disease     Creatinine of 1.6 in 2009  . Hyperlipidemia 10/15/2011    Lipid profile in 03/2009:231, 136, 56, 148  . Carotid artery occlusion   . Prostate carcinoma ([place])   . Atrial fibrillation ([place])     ROS:   All systems reviewed and negative except as noted in the HPI.   Past Surgical History  Procedure Laterality Date  . Cataract extraction      Right  . Colonoscopy w/ polypectomy  2010    Diverticulosis  . Colonoscopy N/A 08/16/2012   Procedure: COLONOSCOPY;  Surgeon: Rogene Houston, MD;  [facility];  Service: Endoscopy;  Laterality: N/A;  930  . Eye surgery Right     Cataract     Family History  Problem Relation Age of Onset  . Hypertension Mother      Social History   Social History  . Marital Status: Married    Spouse Name: N/A  . Number of Children: N/A  . Years of Education: N/A   Occupational History  . Not on file.   Social History Main Topics  . Smoking status: Former Smoker -- 1.00 packs/day for 30 years    Quit date: 09/28/1973  . Smokeless tobacco: Never Used  . Alcohol Use: 3.0 oz/week    5 Glasses of wine per week     Comment: History of excessive alcohol use; Alternates between glass of wine vs. Liquor drink 5 days per week (04/2015)  . Drug Use: No  . Sexual Activity: Not on file   Other Topics Concern  . Not on file   Social History Narrative     BP 142/70 mmHg  Pulse 72  Ht 6\' 1"  (1.854 m)  Wt 175 lb (79.379 kg)  BMI 23.09 kg/m2  SpO2  97%  Physical Exam:  Well appearing 81 yo man, looks younger than his stated age, NAD HEENT: Unremarkable Neck:  6 cm JVD, no thyromegally Back:  No CVA tenderness Lungs:  Clear with no wheezes HEART:  Regular rate rhythm, no murmurs, no rubs, no clicks, split S2.  Abd:  soft, positive bowel sounds, no organomegally, no rebound, no guarding Ext:  2 plus pulses, no edema, no cyanosis, no clubbing Skin:  No rashes no nodules Neuro:  CN II through XII intact, motor grossly intact  EKG - NSR with LBBB  Assess/Plan:

## 2015-06-09 NOTE — Patient Instructions (Addendum)
Your physician wants you to follow-up in: 3 months with Dr.Taylor. You will receive a reminder letter in the mail two months in advance. If you don't receive a letter, please call our office to schedule the follow-up appointment.  Your physician has requested that you have an exercise tolerance test. For further information please visit [email or web address]. Please also follow instruction sheet, as given. Your physician has recommended that you have a pacemaker inserted. A pacemaker is a small device that is placed under the skin of your chest or abdomen to help control abnormal heart rhythms. This device uses electrical pulses to prompt the heart to beat at a normal rate. Pacemakers are used to treat heart rhythms that are too slow. Wire (leads) are attached to the pacemaker that goes into the chambers of you heart. This is done in the hospital and usually requires and overnight stay. Please see the instruction sheet given to you today for more information.   Your physician recommends that you continue on your current medications as directed. Please refer to the Current Medication list given to you today.  If you need a refill on your cardiac medications before your next appointment, please call your pharmacy.  Thank you for choosing [facility]!

## 2015-06-09 NOTE — Assessment & Plan Note (Signed)
The mechanism is unclear. He could have [place] syncope. It could be related to sinus node dysfunction. It could be due to VT. His advanced age makes him not a good candidate for ICD and his EF is not too bad. He could have vasomotor syncope. I have discussed all of this with him and recommended her undergo exercise testing.

## 2015-06-17 ENCOUNTER — Encounter: Payer: Self-pay | Admitting: Family

## 2015-06-25 ENCOUNTER — Encounter: Payer: Self-pay | Admitting: Family

## 2015-06-25 ENCOUNTER — Ambulatory Visit (INDEPENDENT_AMBULATORY_CARE_PROVIDER_SITE_OTHER): Payer: Medicare Other | Admitting: Family

## 2015-06-25 ENCOUNTER — Ambulatory Visit (HOSPITAL_COMMUNITY)
Admission: RE | Admit: 2015-06-25 | Discharge: 2015-06-25 | Disposition: A | Discharge status: Home / Self Care | Payer: Medicare Other | Source: Ambulatory Visit | Attending: Family | Admitting: Family

## 2015-06-25 VITALS — BP 126/67 | HR 65 | Temp 97.1°F | Resp 16 | Ht 77.0 in | Wt 174.9 lb

## 2015-06-25 DIAGNOSIS — E785 Hyperlipidemia, unspecified: Secondary | ICD-10-CM | POA: Diagnosis not present

## 2015-06-25 DIAGNOSIS — I6523 Occlusion and stenosis of bilateral carotid arteries: Secondary | ICD-10-CM

## 2015-06-25 NOTE — Patient Instructions (Signed)
Stroke Prevention Some medical conditions and behaviors are associated with an increased chance of having a stroke. You may prevent a stroke by making healthy choices and managing medical conditions. HOW CAN I REDUCE MY RISK OF HAVING A STROKE?   Stay physically active. Get at least 30 minutes of activity on most or all days.  Do not smoke. It may also be helpful to avoid exposure to secondhand smoke.  Limit alcohol use. Moderate alcohol use is considered to be:  No more than 2 drinks per day for men.  No more than 1 drink per day for nonpregnant women.  Eat healthy foods. This involves:  Eating 5 or more servings of fruits and vegetables a day.  Making dietary changes that address high blood pressure (hypertension), high cholesterol, diabetes, or obesity.  Manage your cholesterol levels.  Making food choices that are high in fiber and low in saturated fat, trans fat, and cholesterol may control cholesterol levels.  Take any prescribed medicines to control cholesterol as directed by your health care provider.  Manage your diabetes.  Controlling your carbohydrate and sugar intake is recommended to manage diabetes.  Take any prescribed medicines to control diabetes as directed by your health care provider.  Control your hypertension.  Making food choices that are low in salt (sodium), saturated fat, trans fat, and cholesterol is recommended to manage hypertension.  Ask your health care provider if you need treatment to lower your blood pressure. Take any prescribed medicines to control hypertension as directed by your health care provider.  If you are 18-39 years of age, have your blood pressure checked every 3-5 years. If you are 40 years of age or older, have your blood pressure checked every year.  Maintain a healthy weight.  Reducing calorie intake and making food choices that are low in sodium, saturated fat, trans fat, and cholesterol are recommended to manage  weight.  Stop drug abuse.  Avoid taking birth control pills.  Talk to your health care provider about the risks of taking birth control pills if you are over 35 years old, smoke, get migraines, or have ever had a blood clot.  Get evaluated for sleep disorders (sleep apnea).  Talk to your health care provider about getting a sleep evaluation if you snore a lot or have excessive sleepiness.  Take medicines only as directed by your health care provider.  For some people, aspirin or blood thinners (anticoagulants) are helpful in reducing the risk of forming abnormal blood clots that can lead to stroke. If you have the irregular heart rhythm of atrial fibrillation, you should be on a blood thinner unless there is a good reason you cannot take them.  Understand all your medicine instructions.  Make sure that other conditions (such as anemia or atherosclerosis) are addressed. SEEK IMMEDIATE MEDICAL CARE IF:   You have sudden weakness or numbness of the face, arm, or leg, especially on one side of the body.  Your face or eyelid droops to one side.  You have sudden confusion.  You have trouble speaking (aphasia) or understanding.  You have sudden trouble seeing in one or both eyes.  You have sudden trouble walking.  You have dizziness.  You have a loss of balance or coordination.  You have a sudden, severe headache with no known cause.  You have new chest pain or an irregular heartbeat. Any of these symptoms may represent a serious problem that is an emergency. Do not wait to see if the symptoms will   go away. Get medical help at once. Call your local emergency services (911 in [place] Do not drive yourself to the hospital.   This information is not intended to replace advice given to you by your health care provider. Make sure you discuss any questions you have with your health care provider.   Document Released: 07/21/2004 Document Revised: 07/04/2014 Document Reviewed:  12/14/2012 Elsevier Interactive Patient Education 2016 Elsevier Inc.  

## 2015-06-25 NOTE — Addendum Note (Signed)
Addended by: Dorthula Rue L on: 06/25/2015 01:13 PM   Modules accepted: Orders

## 2015-06-25 NOTE — Progress Notes (Signed)
Chief Complaint: Extracranial Carotid Artery Stenosis   History of Present Illness  Max Cole is a 81 y.o. male patient of Dr. Donnetta Hutching who has known carotid artery stenosis. He returns today for follow up.  Patient has not had previous carotid artery intervention.   He walks 2 miles, 5 days/week.   The patient has no history of TIA or stroke symptom. The patient denies a history of amaurosis fugax or monocular blindness, unilateral facial drooping, hemiplegia, or receptive or expressive aphasia.  Patient denies claudication symptoms, denies non-healing wounds.   Patient reports New Medical or Surgical History: syncopal episode in November 2016, states his heart rate decreases to 35/min at times; he knew he needed a pacemaker, and states he will have a pacemaker insertion 07/03/15.   Pt Diabetic: No  Pt smoker: smoked off and on for 20+ years, quit in the 1970's   Pt meds include:  Statin : Yes  Betablocker: no ASA: Yes  Other anticoagulants/antiplatelets: no     Past Medical History  Diagnosis Date  . Cerebrovascular disease     Carotid ultrasound in 12/2010-60-79% left internal carotid artery, less than 40% or right RICA, no change  . Left bundle branch block   . Cardiomyopathy 2005    a. Possibly alcoholic; b. cath in 0000000 ostial D1, PCW of 12, EF of 35-40%;  c. EF of 0.25 in 11/2003;  d. 40-45% in 05/2004;  e. 25% in 10/2008 by echo;  f. 04/2015 Echo: EF 40-45%, diff HK, sev inflat/inf HK, Gr 1 DD, mild AI, triv TR.  Marland Kitchen Chronic kidney disease     Creatinine of 1.6 in 2009  . Hyperlipidemia 10/15/2011    Lipid profile in 03/2009:231, 136, 56, 148  . Carotid artery occlusion   . Prostate carcinoma ([place])   . Atrial fibrillation [facility])     Social History Social History  Substance Use Topics  . Smoking status: Former Smoker -- 1.00 packs/day for 30 years    Quit date: 09/28/1973  . Smokeless tobacco: Never Used  . Alcohol Use: 3.0 oz/week    5 Glasses of  wine per week     Comment: History of excessive alcohol use; Alternates between glass of wine vs. Liquor drink 5 days per week (04/2015)    Family History Family History  Problem Relation Age of Onset  . Hypertension Mother     Surgical History Past Surgical History  Procedure Laterality Date  . Cataract extraction      Right  . Colonoscopy w/ polypectomy  2010    Diverticulosis  . Colonoscopy N/A 08/16/2012    Procedure: COLONOSCOPY;  Surgeon: Rogene Houston, MD;  [facility];  Service: Endoscopy;  Laterality: N/A;  930  . Eye surgery Right     Cataract    No Known Allergies  Current Outpatient Prescriptions  Medication Sig Dispense Refill  . aspirin 81 MG tablet Take 81 mg by mouth daily.    . pravastatin (PRAVACHOL) 10 MG tablet Take 10 mg by mouth daily.    . ramipril (ALTACE) 2.5 MG tablet Take 1.25 mg by mouth daily. Take 1/2 tab daily     No current facility-administered medications for this visit.    Review of Systems : See HPI for pertinent positives and negatives.  Physical Examination  Filed Vitals:   06/25/15 1202 06/25/15 1205  BP: 125/70 126/67  Pulse: 68 65  Temp:  97.1 F (36.2 C)  TempSrc:  Oral  Resp:  16  Height:  6\' 5"  (1.956 m)  Weight:  174 lb 14.4 oz (79.334 kg)  SpO2:  97%   Body mass index is 20.74 kg/(m^2).  General: WDWN male in NAD  GAIT: normal  Eyes: PERRLA  Pulmonary: CTAB, no rales,  rhonchi, or wheezing.  Cardiac: regular rhythm, no detected murmur.   VASCULAR EXAM  Carotid Bruits  Left  Right    positive Negative    Radial pulses are 2+ palpable and equal.  Gastrointestinal: soft, nontender, BS WNL, no r/g, no palpable masses.  Musculoskeletal: No muscle atrophy/wasting. M/S 5/5 throughout, Extremities without ischemic changes.  Neurologic: A&O X 3; Appropriate Affect, Speech is normal  CN 2-12 intact, Pain and light touch intact in extremities, Motor exam as listed above.                 Non-Invasive Vascular Imaging CAROTID DUPLEX 06/25/2015   Right ICA: 40 - 59 % stenosis. Left ICA: 60 - 79 % stenosis. Bilateral vertebral artery is antegrade. No significant change in comparison to the last exam.    Assessment: Max Cole is a 81 y.o. male who has stable bilateral carotid artery stenosis. He has no hx of stroke or TIA. Today's carotid duplex suggests 40-59% right ICA stenosis and 60-79% left ICA stenosis. No significant change in comparison to the last exam.     Plan: Follow-up in 6 months with Carotid Duplex scan.   I discussed in depth with the patient the nature of atherosclerosis, and emphasized the importance of maximal medical management including strict control of blood pressure, blood glucose, and lipid levels, obtaining regular exercise, and continued cessation of smoking.  The patient is aware that without maximal medical management the underlying atherosclerotic disease process will progress, limiting the benefit of any interventions. The patient was given information about stroke prevention and what symptoms should prompt the patient to seek immediate medical care. Thank you for allowing [place] to participate in this patient's care.  Clemon Chambers, RN, MSN, FNP-C [facility] Office: [phone]  Clinic Physician: Trula Max  06/25/2015 12:31 PM

## 2015-06-30 ENCOUNTER — Encounter (HOSPITAL_COMMUNITY): Payer: Medicare Other

## 2015-06-30 ENCOUNTER — Ambulatory Visit: Payer: Medicare Other | Admitting: Family

## 2015-06-30 ENCOUNTER — Telehealth: Payer: Self-pay | Admitting: Internal Medicine

## 2015-06-30 NOTE — Telephone Encounter (Signed)
New Message  Pt wanted to speak w/ RN concerning his surgery on 07/02/14. Please call back and discuss.

## 2015-07-01 NOTE — Telephone Encounter (Signed)
Patient request another copy of pacemake insertion letter. Letter printed and placed at the front desk for pick up.

## 2015-07-03 ENCOUNTER — Encounter (HOSPITAL_COMMUNITY)
Admission: RE | Disposition: A | Discharge status: Home / Self Care | Payer: Medicare Other | Source: Ambulatory Visit | Attending: Internal Medicine

## 2015-07-03 ENCOUNTER — Ambulatory Visit (HOSPITAL_COMMUNITY)
Admission: RE | Admit: 2015-07-03 | Discharge: 2015-07-04 | Disposition: A | Discharge status: Home / Self Care | Payer: Medicare Other | Source: Ambulatory Visit | Attending: Internal Medicine | Admitting: Internal Medicine

## 2015-07-03 ENCOUNTER — Encounter (HOSPITAL_COMMUNITY): Payer: Self-pay | Admitting: Physician Assistant

## 2015-07-03 DIAGNOSIS — I429 Cardiomyopathy, unspecified: Secondary | ICD-10-CM | POA: Insufficient documentation

## 2015-07-03 DIAGNOSIS — I6523 Occlusion and stenosis of bilateral carotid arteries: Secondary | ICD-10-CM | POA: Diagnosis not present

## 2015-07-03 DIAGNOSIS — I679 Cerebrovascular disease, unspecified: Secondary | ICD-10-CM | POA: Diagnosis present

## 2015-07-03 DIAGNOSIS — I959 Hypotension, unspecified: Secondary | ICD-10-CM | POA: Insufficient documentation

## 2015-07-03 DIAGNOSIS — Z8673 Personal history of transient ischemic attack (TIA), and cerebral infarction without residual deficits: Secondary | ICD-10-CM | POA: Insufficient documentation

## 2015-07-03 DIAGNOSIS — I480 Paroxysmal atrial fibrillation: Secondary | ICD-10-CM | POA: Diagnosis not present

## 2015-07-03 DIAGNOSIS — I4430 Unspecified atrioventricular block: Secondary | ICD-10-CM | POA: Insufficient documentation

## 2015-07-03 DIAGNOSIS — I5022 Chronic systolic (congestive) heart failure: Secondary | ICD-10-CM | POA: Diagnosis not present

## 2015-07-03 DIAGNOSIS — E785 Hyperlipidemia, unspecified: Secondary | ICD-10-CM | POA: Diagnosis not present

## 2015-07-03 DIAGNOSIS — Z7982 Long term (current) use of aspirin: Secondary | ICD-10-CM | POA: Diagnosis not present

## 2015-07-03 DIAGNOSIS — I447 Left bundle-branch block, unspecified: Secondary | ICD-10-CM | POA: Diagnosis not present

## 2015-07-03 DIAGNOSIS — I130 Hypertensive heart and chronic kidney disease with heart failure and stage 1 through stage 4 chronic kidney disease, or unspecified chronic kidney disease: Secondary | ICD-10-CM | POA: Diagnosis not present

## 2015-07-03 DIAGNOSIS — R55 Syncope and collapse: Secondary | ICD-10-CM | POA: Insufficient documentation

## 2015-07-03 DIAGNOSIS — Z950 Presence of cardiac pacemaker: Secondary | ICD-10-CM

## 2015-07-03 DIAGNOSIS — Z8546 Personal history of malignant neoplasm of prostate: Secondary | ICD-10-CM | POA: Diagnosis not present

## 2015-07-03 DIAGNOSIS — Z87891 Personal history of nicotine dependence: Secondary | ICD-10-CM | POA: Diagnosis not present

## 2015-07-03 DIAGNOSIS — N189 Chronic kidney disease, unspecified: Secondary | ICD-10-CM | POA: Insufficient documentation

## 2015-07-03 HISTORY — DX: Syncope and collapse: R55

## 2015-07-03 HISTORY — DX: Bradycardia, unspecified: R00.1

## 2015-07-03 HISTORY — PX: EP IMPLANTABLE DEVICE: SHX172B

## 2015-07-03 LAB — BASIC METABOLIC PANEL
ANION GAP: 6 (ref 5–15)
BUN: 25 mg/dL — ABNORMAL HIGH (ref 6–20)
CALCIUM: 9.4 mg/dL (ref 8.9–10.3)
CHLORIDE: 108 mmol/L (ref 101–111)
CO2: 28 mmol/L (ref 22–32)
CREATININE: 1.72 mg/dL — AB (ref 0.61–1.24)
GFR calc non Af Amer: 34 mL/min — ABNORMAL LOW (ref >60)
GFR, EST AFRICAN AMERICAN: 40 mL/min — AB (ref >60)
Glucose, Bld: 95 mg/dL (ref 65–99)
Potassium: 5.7 mmol/L — ABNORMAL HIGH (ref 3.5–5.1)
SODIUM: 142 mmol/L (ref 135–145)

## 2015-07-03 LAB — SURGICAL PCR SCREEN
MRSA, PCR: NEGATIVE
Staphylococcus aureus: NEGATIVE

## 2015-07-03 LAB — CBC
HCT: 34 % — ABNORMAL LOW (ref 39.0–52.0)
HEMOGLOBIN: 11.1 g/dL — AB (ref 13.0–17.0)
MCH: 31.6 pg (ref 26.0–34.0)
MCHC: 32.6 g/dL (ref 30.0–36.0)
MCV: 96.9 fL (ref 78.0–100.0)
PLATELETS: 295 10*3/uL (ref 150–400)
RBC: 3.51 MIL/uL — AB (ref 4.22–5.81)
RDW: 12.2 % (ref 11.5–15.5)
WBC: 5.8 10*3/uL (ref 4.0–10.5)

## 2015-07-03 SURGERY — BIV PACEMAKER INSERTION CRT-P
Anesthesia: LOCAL

## 2015-07-03 MED ORDER — LIDOCAINE HCL (PF) 1 % IJ SOLN
INTRAMUSCULAR | Status: DC | PRN
  Administered 2015-07-03: 53 mL

## 2015-07-03 MED ORDER — MUPIROCIN 2 % EX OINT
1.0000 "application " | TOPICAL_OINTMENT | Freq: Once | CUTANEOUS | Status: AC
  Administered 2015-07-03: 1 via TOPICAL

## 2015-07-03 MED ORDER — SODIUM CHLORIDE 0.9 % IV SOLN
INTRAVENOUS | Status: DC
  Administered 2015-07-03: 10:00:00 via INTRAVENOUS

## 2015-07-03 MED ORDER — GENTAMICIN SULFATE 40 MG/ML IJ SOLN: 80.0000 mg | INTRAMUSCULAR | Status: DC

## 2015-07-03 MED ORDER — LIDOCAINE HCL (PF) 1 % IJ SOLN
INTRAMUSCULAR | Status: AC
  Filled 2015-07-03: qty 30

## 2015-07-03 MED ORDER — ACETAMINOPHEN 325 MG PO TABS
325.0000 mg | ORAL_TABLET | ORAL | Status: DC | PRN
  Administered 2015-07-04: 650 mg via ORAL
  Filled 2015-07-03: qty 2

## 2015-07-03 MED ORDER — SODIUM CHLORIDE 0.9 % IR SOLN
Status: AC
  Filled 2015-07-03: qty 2

## 2015-07-03 MED ORDER — MIDAZOLAM HCL 5 MG/5ML IJ SOLN
INTRAMUSCULAR | Status: DC | PRN
  Administered 2015-07-03 (×4): 1 mg via INTRAVENOUS

## 2015-07-03 MED ORDER — LIDOCAINE HCL (PF) 1 % IJ SOLN
INTRAMUSCULAR | Status: AC
  Filled 2015-07-03: qty 60

## 2015-07-03 MED ORDER — VANCOMYCIN HCL IN DEXTROSE 1-5 GM/200ML-% IV SOLN
1000.0000 mg | INTRAVENOUS | Status: DC
  Filled 2015-07-03: qty 200

## 2015-07-03 MED ORDER — SODIUM POLYSTYRENE SULFONATE 15 GM/60ML PO SUSP
15.0000 g | Freq: Once | ORAL | Status: AC
Start: 2015-07-03 — End: 2015-07-03
  Administered 2015-07-03: 15 g via ORAL
  Filled 2015-07-03: qty 60

## 2015-07-03 MED ORDER — PRAVASTATIN SODIUM 20 MG PO TABS
10.0000 mg | ORAL_TABLET | Freq: Every day | ORAL | Status: DC
  Administered 2015-07-03 – 2015-07-04 (×2): 10 mg via ORAL
  Filled 2015-07-03 (×2): qty 1

## 2015-07-03 MED ORDER — CARVEDILOL 3.125 MG PO TABS
3.1250 mg | ORAL_TABLET | Freq: Two times a day (BID) | ORAL | Status: DC
  Administered 2015-07-03 – 2015-07-04 (×2): 3.125 mg via ORAL
  Filled 2015-07-03 (×2): qty 1

## 2015-07-03 MED ORDER — FENTANYL CITRATE (PF) 100 MCG/2ML IJ SOLN
INTRAMUSCULAR | Status: DC | PRN
  Administered 2015-07-03 (×3): 25 ug via INTRAVENOUS

## 2015-07-03 MED ORDER — ONDANSETRON HCL 4 MG/2ML IJ SOLN: 4.0000 mg | Freq: Four times a day (QID) | INTRAMUSCULAR | Status: DC | PRN

## 2015-07-03 MED ORDER — ASPIRIN EC 81 MG PO TBEC
81.0000 mg | DELAYED_RELEASE_TABLET | Freq: Every day | ORAL | Status: DC
  Administered 2015-07-03 – 2015-07-04 (×2): 81 mg via ORAL
  Filled 2015-07-03 (×2): qty 1

## 2015-07-03 MED ORDER — MIDAZOLAM HCL 5 MG/5ML IJ SOLN
INTRAMUSCULAR | Status: AC
  Filled 2015-07-03: qty 5

## 2015-07-03 MED ORDER — HEPARIN (PORCINE) IN NACL 2-0.9 UNIT/ML-% IJ SOLN
INTRAMUSCULAR | Status: AC
  Filled 2015-07-03: qty 500

## 2015-07-03 MED ORDER — CEFAZOLIN SODIUM 1-5 GM-% IV SOLN
1.0000 g | Freq: Four times a day (QID) | INTRAVENOUS | Status: AC
  Administered 2015-07-03 – 2015-07-04 (×3): 1 g via INTRAVENOUS
  Filled 2015-07-03 (×3): qty 50

## 2015-07-03 MED ORDER — SODIUM CHLORIDE 0.9 % IR SOLN
Status: DC | PRN
  Administered 2015-07-03: 12:00:00

## 2015-07-03 MED ORDER — MUPIROCIN 2 % EX OINT
TOPICAL_OINTMENT | CUTANEOUS | Status: AC
Start: 2015-07-03 — End: 2015-07-03
  Administered 2015-07-03: 1 via TOPICAL
  Filled 2015-07-03: qty 22

## 2015-07-03 MED ORDER — RAMIPRIL 1.25 MG PO CAPS
1.2500 mg | ORAL_CAPSULE | Freq: Every day | ORAL | Status: DC
  Administered 2015-07-03 – 2015-07-04 (×2): 1.25 mg via ORAL
  Filled 2015-07-03 (×2): qty 1

## 2015-07-03 MED ORDER — VANCOMYCIN HCL 1000 MG IV SOLR
1000.0000 mg | INTRAVENOUS | Status: DC | PRN
  Administered 2015-07-03: 1000 mg via INTRAVENOUS

## 2015-07-03 MED ORDER — FENTANYL CITRATE (PF) 100 MCG/2ML IJ SOLN
INTRAMUSCULAR | Status: AC
  Filled 2015-07-03: qty 2

## 2015-07-03 SURGICAL SUPPLY — 19 items
BALLN ATTAIN 80 (BALLOONS) ×2
BALLOON ATTAIN 80 (BALLOONS) IMPLANT
CABLE SURGICAL S-101-97-12 (CABLE) ×1 IMPLANT
CATH ACUITYPRO 45CM H 9F (CATHETERS) ×1 IMPLANT
CATH ACUITYPRO IC 90 7F (CATHETERS) ×2
CATH GD CS-IC 90 CVD 60X7FR (CATHETERS) IMPLANT
CATH HEX JOSEPH 2-5-2 65CM 6F (CATHETERS) ×1 IMPLANT
CRT-P PPM VALITUDE U128 (Pacemaker) ×2 IMPLANT
DEVICE CRT-P PPM VALITUDE U128 (Pacemaker) IMPLANT
INGEVITY MRI 7741-52CM (Lead) ×2 IMPLANT
INGEVITY MRI 7742-59CM (Lead) ×2 IMPLANT
LEAD ACUITY X4 4677 (Lead) ×1 IMPLANT
LEAD PACING INGEVITY MRI 52CM (Lead) IMPLANT
LEAD PACING INGEVITY MRI 59CM (Lead) IMPLANT
PAD DEFIB LIFELINK (PAD) ×1 IMPLANT
SHEATH CLASSIC 7F (SHEATH) ×2 IMPLANT
SHEATH CLASSIC 9.5F (SHEATH) ×1 IMPLANT
TRAY PACEMAKER INSERTION (PACKS) ×1 IMPLANT
WIRE ACUITY WHISPER EDS 4648 (WIRE) ×1 IMPLANT

## 2015-07-03 NOTE — H&P (View-Only) (Signed)
HPI Max Cole is referred today for evaluation of syncope. He was hospitalized several weeks ago with syncope. He was noted to be bradycardic and hypotensive. His beta blocker was stopped. He was observed and his heart rate improved. He was discharged off of his beta blocker. He has LV dysfunction with an EF of 40% and also has LBBB. The patient notes a total of three episodes of syncope with the first 2 occuring while he was working in the yard. No loss of bowel or bladder continence. He did not bite his tongue. He has class 2A heart failure symptoms and he is able to do most anything he wants.  No Known Allergies   Current Outpatient Prescriptions  Medication Sig Dispense Refill  . aspirin 81 MG tablet Take 81 mg by mouth daily.    . pravastatin (PRAVACHOL) 10 MG tablet Take 10 mg by mouth daily.    . ramipril (ALTACE) 2.5 MG tablet Take 1.25 mg by mouth daily. Take 1/2 tab daily     No current facility-administered medications for this visit.     Past Medical History  Diagnosis Date  . Cerebrovascular disease     Carotid ultrasound in 12/2010-60-79% left internal carotid artery, less than 40% or right RICA, no change  . Left bundle branch block   . Cardiomyopathy 2005    a. Possibly alcoholic; b. cath in 0000000 ostial D1, PCW of 12, EF of 35-40%;  c. EF of 0.25 in 11/2003;  d. 40-45% in 05/2004;  e. 25% in 10/2008 by echo;  f. 04/2015 Echo: EF 40-45%, diff HK, sev inflat/inf HK, Gr 1 DD, mild AI, triv TR.  Marland Kitchen Chronic kidney disease     Creatinine of 1.6 in 2009  . Hyperlipidemia 10/15/2011    Lipid profile in 03/2009:231, 136, 56, 148  . Carotid artery occlusion   . Prostate carcinoma ([place])   . Atrial fibrillation ([place])     ROS:   All systems reviewed and negative except as noted in the HPI.   Past Surgical History  Procedure Laterality Date  . Cataract extraction      Right  . Colonoscopy w/ polypectomy  2010    Diverticulosis  . Colonoscopy N/A 08/16/2012   Procedure: COLONOSCOPY;  Surgeon: Rogene Houston, MD;  [facility];  Service: Endoscopy;  Laterality: N/A;  930  . Eye surgery Right     Cataract     Family History  Problem Relation Age of Onset  . Hypertension Mother      Social History   Social History  . Marital Status: Married    Spouse Name: N/A  . Number of Children: N/A  . Years of Education: N/A   Occupational History  . Not on file.   Social History Main Topics  . Smoking status: Former Smoker -- 1.00 packs/day for 30 years    Quit date: 09/28/1973  . Smokeless tobacco: Never Used  . Alcohol Use: 3.0 oz/week    5 Glasses of wine per week     Comment: History of excessive alcohol use; Alternates between glass of wine vs. Liquor drink 5 days per week (04/2015)  . Drug Use: No  . Sexual Activity: Not on file   Other Topics Concern  . Not on file   Social History Narrative     BP 142/70 mmHg  Pulse 72  Ht 6\' 1"  (1.854 m)  Wt 175 lb (79.379 kg)  BMI 23.09 kg/m2  SpO2  97%  Physical Exam:  Well appearing 82 yo man, looks younger than his stated age, NAD HEENT: Unremarkable Neck:  6 cm JVD, no thyromegally Back:  No CVA tenderness Lungs:  Clear with no wheezes HEART:  Regular rate rhythm, no murmurs, no rubs, no clicks, split S2.  Abd:  soft, positive bowel sounds, no organomegally, no rebound, no guarding Ext:  2 plus pulses, no edema, no cyanosis, no clubbing Skin:  No rashes no nodules Neuro:  CN II through XII intact, motor grossly intact  EKG - NSR with LBBB  Assess/Plan:

## 2015-07-03 NOTE — Interval H&P Note (Signed)
History and Physical Interval Note:  07/03/2015 9:15 AM  Max Cole  has presented today for surgery, with the diagnosis of llb block syncope  The various methods of treatment have been discussed with the patient and family. After consideration of risks, benefits and other options for treatment, the patient has consented to  Procedure(s): BiV Pacemaker Insertion CRT-P (N/A) as a surgical intervention .  The patient's history has been reviewed, patient examined, no change in status, stable for surgery.  I have reviewed the patient's chart and labs.  Questions were answered to the patient's satisfaction.     Mikle Bosworth.D.

## 2015-07-03 NOTE — Discharge Instructions (Signed)
° ° °  Supplemental Discharge Instructions for  Pacemaker/Defibrillator Patients  Activity No heavy lifting or vigorous activity with your left/right arm for 6 to 8 weeks.  Do not raise your left/right arm above your head for one week.  Gradually raise your affected arm as drawn below.           07/08/15                     07/09/15                     07/10/15                   07/11/15 __  NO DRIVING for  1 week   ; you may begin driving on  [id number]   .  WOUND CARE - Keep the wound area clean and dry.  Do not get this area wet for one week. No showers for one week; you may shower on  07/11/15   . - The tape/steri-strips on your wound will fall off; do not pull them off.  No bandage is needed on the site.  DO  NOT apply any creams, oils, or ointments to the wound area. - If you notice any drainage or discharge from the wound, any swelling or bruising at the site, or you develop a fever > 101? F after you are discharged home, call the office at once.  Special Instructions - You are still able to use cellular telephones; use the ear opposite the side where you have your pacemaker/defibrillator.  Avoid carrying your cellular phone near your device. - When traveling through airports, show security personnel your identification card to avoid being screened in the metal detectors.  Ask the security personnel to use the hand wand. - Avoid arc welding equipment, MRI testing (magnetic resonance imaging), TENS units (transcutaneous nerve stimulators).  Call the office for questions about other devices. - Avoid electrical appliances that are in poor condition or are not properly grounded. - Microwave ovens are safe to be near or to operate.  Additional information for defibrillator patients should your device go off: - If your device goes off ONCE and you feel fine afterward, notify the device clinic nurses. - If your device goes off ONCE and you do not feel well afterward, call 911. - If your device goes  off TWICE, call 911. - If your device goes off THREE times in one day, call 911.  DO NOT DRIVE YOURSELF OR A FAMILY MEMBER WITH A DEFIBRILLATOR TO THE HOSPITAL--CALL 911.

## 2015-07-04 ENCOUNTER — Ambulatory Visit (HOSPITAL_COMMUNITY): Payer: Medicare Other

## 2015-07-04 DIAGNOSIS — Z7982 Long term (current) use of aspirin: Secondary | ICD-10-CM | POA: Diagnosis not present

## 2015-07-04 DIAGNOSIS — Z8673 Personal history of transient ischemic attack (TIA), and cerebral infarction without residual deficits: Secondary | ICD-10-CM | POA: Diagnosis not present

## 2015-07-04 DIAGNOSIS — I130 Hypertensive heart and chronic kidney disease with heart failure and stage 1 through stage 4 chronic kidney disease, or unspecified chronic kidney disease: Secondary | ICD-10-CM | POA: Diagnosis not present

## 2015-07-04 DIAGNOSIS — Z950 Presence of cardiac pacemaker: Secondary | ICD-10-CM

## 2015-07-04 DIAGNOSIS — E785 Hyperlipidemia, unspecified: Secondary | ICD-10-CM | POA: Diagnosis not present

## 2015-07-04 DIAGNOSIS — N189 Chronic kidney disease, unspecified: Secondary | ICD-10-CM | POA: Diagnosis not present

## 2015-07-04 DIAGNOSIS — I959 Hypotension, unspecified: Secondary | ICD-10-CM | POA: Diagnosis not present

## 2015-07-04 DIAGNOSIS — I5022 Chronic systolic (congestive) heart failure: Secondary | ICD-10-CM

## 2015-07-04 DIAGNOSIS — I4430 Unspecified atrioventricular block: Secondary | ICD-10-CM | POA: Diagnosis not present

## 2015-07-04 DIAGNOSIS — I447 Left bundle-branch block, unspecified: Secondary | ICD-10-CM | POA: Diagnosis not present

## 2015-07-04 DIAGNOSIS — I480 Paroxysmal atrial fibrillation: Secondary | ICD-10-CM | POA: Diagnosis not present

## 2015-07-04 DIAGNOSIS — Z87891 Personal history of nicotine dependence: Secondary | ICD-10-CM | POA: Diagnosis not present

## 2015-07-04 DIAGNOSIS — I6523 Occlusion and stenosis of bilateral carotid arteries: Secondary | ICD-10-CM | POA: Diagnosis not present

## 2015-07-04 DIAGNOSIS — R55 Syncope and collapse: Secondary | ICD-10-CM | POA: Diagnosis not present

## 2015-07-04 DIAGNOSIS — I429 Cardiomyopathy, unspecified: Secondary | ICD-10-CM | POA: Diagnosis not present

## 2015-07-04 LAB — BASIC METABOLIC PANEL
ANION GAP: 9 (ref 5–15)
BUN: 24 mg/dL — ABNORMAL HIGH (ref 6–20)
CHLORIDE: 103 mmol/L (ref 101–111)
CO2: 29 mmol/L (ref 22–32)
CREATININE: 1.61 mg/dL — AB (ref 0.61–1.24)
Calcium: 8.9 mg/dL (ref 8.9–10.3)
GFR calc non Af Amer: 37 mL/min — ABNORMAL LOW (ref >60)
GFR, EST AFRICAN AMERICAN: 43 mL/min — AB (ref >60)
Glucose, Bld: 89 mg/dL (ref 65–99)
POTASSIUM: 4.5 mmol/L (ref 3.5–5.1)
SODIUM: 141 mmol/L (ref 135–145)

## 2015-07-04 LAB — CBC
HEMATOCRIT: 32.3 % — AB (ref 39.0–52.0)
HEMOGLOBIN: 10.6 g/dL — AB (ref 13.0–17.0)
MCH: 31.7 pg (ref 26.0–34.0)
MCHC: 32.8 g/dL (ref 30.0–36.0)
MCV: 96.7 fL (ref 78.0–100.0)
Platelets: 273 10*3/uL (ref 150–400)
RBC: 3.34 MIL/uL — AB (ref 4.22–5.81)
RDW: 12 % (ref 11.5–15.5)
WBC: 6.2 10*3/uL (ref 4.0–10.5)

## 2015-07-04 MED ORDER — CARVEDILOL 3.125 MG PO TABS: 3.1250 mg | ORAL_TABLET | Freq: Two times a day (BID) | ORAL | Status: DC

## 2015-07-04 NOTE — Progress Notes (Signed)
Patient Name: Max Cole Date of Encounter: 07/04/2015  Primary Cardiologist: Dr. Lovena Le   Principal Problem:   Pacemaker Active Problems:   Cerebrovascular disease   Syncope   Chronic systolic heart failure ([place])    SUBJECTIVE  Feeling well. Ready to go home. Denies any significant SOB. Chest sore.   CURRENT MEDS . aspirin EC  81 mg Oral Daily  . carvedilol  3.125 mg Oral BID WC  . pravastatin  10 mg Oral Daily  . ramipril  1.25 mg Oral Daily    OBJECTIVE  Filed Vitals:   07/03/15 1328 07/03/15 1940 07/04/15 0000 07/04/15 0530  BP: 131/61 122/58 129/60 119/54  Pulse: 59 65 62 64  Temp: 97.5 F (36.4 C) 97.9 F (36.6 C) 97.9 F (36.6 C) 97.7 F (36.5 C)  TempSrc: Oral Oral Oral Oral  Resp: 16 18 18 18   Height: 6\' 1"  (1.854 m)     Weight: 169 lb 9.6 oz (76.93 kg)   170 lb 10.2 oz (77.4 kg)  SpO2: 98% 98% 97% 97%    Intake/Output Summary (Last 24 hours) at 07/04/15 1053 Last data filed at 07/04/15 0849  Gross per 24 hour  Intake   1050 ml  Output      0 ml  Net   1050 ml   Filed Weights   07/03/15 0933 07/03/15 1328 07/04/15 0530  Weight: 175 lb (79.379 kg) 169 lb 9.6 oz (76.93 kg) 170 lb 10.2 oz (77.4 kg)    PHYSICAL EXAM  General: Pleasant, NAD. Neuro: Alert and oriented X 3. Moves all extremities spontaneously. Psych: Normal affect. HEENT:  Normal  Neck: Supple without bruits or JVD. Lungs:  Resp regular and unlabored, CTA. Heart: RRR no s3, s4, or murmurs. PPM noted in L pectoral region, dressing in place, apepars to be clean dry intact. Abdomen: Soft, non-tender, non-distended, BS + x 4.  Extremities: No clubbing, cyanosis or edema. DP/PT/Radials 2+ and equal bilaterally.  Accessory Clinical Findings  CBC  Recent Labs  07/03/15 0948  WBC 5.8  HGB 11.1*  HCT 34.0*  MCV 96.9  PLT [id number]   Basic Metabolic Panel  Recent Labs  07/03/15 0948  NA 142  K 5.7*  CL 108  CO2 28  GLUCOSE 95  BUN 25*  CREATININE 1.72*  CALCIUM 9.4      TELE Paced rhythm    ECG  Atrial sensed and ventricularly paced rhythm  Echocardiogram 05/21/2015  LV EF: 40% -  45%  ------------------------------------------------------------------- Indications:   Syncope 780.2.  ------------------------------------------------------------------- History:  PMH:  Atrial fibrillation. PMH: History of cardiomyopathy. Risk factors: Former tobacco use. Dyslipidemia.  ------------------------------------------------------------------- Study Conclusions  - Left ventricle: The cavity size was normal. There was moderate concentric hypertrophy. Systolic function was mildly to moderately reduced. The estimated ejection fraction was in the range of 40% to 45%. Diffuse hypokinesis. There is severe hypokinesis of the entireinferolateral and inferior myocardium. There was an increased relative contribution of atrial contraction to ventricular filling. Doppler parameters are consistent with abnormal left ventricular relaxation (grade 1 diastolic dysfunction). - Ventricular septum: Septal motion showed paradox. - Aortic valve: Moderate diffuse thickening and calcification, consistent with sclerosis. There was mild regurgitation. - Tricuspid valve: There was trivial regurgitation.    Radiology/Studies  No results found.  ASSESSMENT AND PLAN  82 yo male with PMH of CVA, LBBB, NICM probably related to EtOH, HTN, HLD, PAF who was referred to Dr. Lovena Le for evaluation of syncope. Noted to be bradycardia and hypotensive  on recent hospitalization. BB stopped. He continue to have symptom of syncope  1. Symptomatic LBBB AV block  - PPM implantation by Dr. Lovena Le biventricular [place] Sci BiV PPM on 07/03/2015  - MD to review interrogation report in the shadow chart. I ordered CXR, CBC and BMET. Need to followup on CXR result to make sure there is no complication. CBC to check hgb and BMET to check potassium and Cr. Not sure  why his K was 5.7 yesterday, hemolysis?.  2. NICM probably related to EtOH  - Echo 05/21/2015 EF 40-45%, grade 1 DD, septal paradoxical movement, mild AR  3. LBBB  4. HTN 5. HLD 6. PAF 7. CVA   Signed, Almyra Deforest PA-C Pager: [phone]   Personally seen and examined. Agree with above. CXR OK Pacer interrogation OK Bi-V pacer - EF 40% Repeating labs (K 5.7 yesterday) Should be OK to DC post labs. Lurena Joiner will review.   Candee Furbish, MD

## 2015-07-04 NOTE — Progress Notes (Signed)
Max Cole to be D/C'd Home per MD order.  Discussed with the patient and all questions fully answered.  VSS, Skin clean, dry and intact without evidence of skin break down, no evidence of skin tears noted. IV catheter discontinued intact. Site without signs and symptoms of complications. Dressing and pressure applied.  An After Visit Summary was printed and given to the patient. Patient received prescription.  D/c education completed with patient/family including follow up instructions, medication list, d/c activities limitations if indicated, with other d/c instructions as indicated by MD - patient able to verbalize understanding, all questions fully answered.   Patient instructed to return to ED, call 911, or call MD for any changes in condition.   Patient escorted via [place], and D/C home via private auto.  Burgess Estelle 07/04/2015 3:32 PM

## 2015-07-07 MED FILL — Heparin Sodium (Porcine) 2 Unit/ML in Sodium Chloride 0.9%: INTRAMUSCULAR | Qty: 500 | Status: AC

## 2015-07-09 ENCOUNTER — Ambulatory Visit (INDEPENDENT_AMBULATORY_CARE_PROVIDER_SITE_OTHER): Payer: Medicare Other | Admitting: *Deleted

## 2015-07-09 ENCOUNTER — Ambulatory Visit (HOSPITAL_COMMUNITY)
Admission: RE | Admit: 2015-07-09 | Discharge: 2015-07-09 | Disposition: A | Discharge status: Home / Self Care | Payer: Medicare Other | Source: Ambulatory Visit | Attending: Internal Medicine | Admitting: Internal Medicine

## 2015-07-09 DIAGNOSIS — R001 Bradycardia, unspecified: Secondary | ICD-10-CM | POA: Diagnosis not present

## 2015-07-09 DIAGNOSIS — I429 Cardiomyopathy, unspecified: Secondary | ICD-10-CM | POA: Diagnosis not present

## 2015-07-09 DIAGNOSIS — Z950 Presence of cardiac pacemaker: Secondary | ICD-10-CM

## 2015-07-09 DIAGNOSIS — R0602 Shortness of breath: Secondary | ICD-10-CM | POA: Diagnosis not present

## 2015-07-09 DIAGNOSIS — I428 Other cardiomyopathies: Secondary | ICD-10-CM

## 2015-07-09 LAB — CUP PACEART INCLINIC DEVICE CHECK
Brady Statistic RA Percent Paced: 41 %
Brady Statistic RV Percent Paced: 96 %
Implantable Lead Implant Date: 20170106
Implantable Lead Location: 753858
Implantable Lead Location: 753859
Implantable Lead Model: 7742
Implantable Lead Serial Number: 695539
Lead Channel Impedance Value: 660 Ohm
Lead Channel Pacing Threshold Amplitude: 0.5 V
Lead Channel Pacing Threshold Amplitude: 0.8 V
Lead Channel Pacing Threshold Pulse Width: 0.4 ms
Lead Channel Pacing Threshold Pulse Width: 0.6 ms
Lead Channel Setting Pacing Amplitude: 2 V
Lead Channel Setting Pacing Amplitude: 5 V
Lead Channel Setting Pacing Pulse Width: 0.4 ms
MDC IDC LEAD IMPLANT DT: 20170106
MDC IDC LEAD IMPLANT DT: 20170106
MDC IDC LEAD LOCATION: 753860
MDC IDC LEAD SERIAL: 507382
MDC IDC LEAD SERIAL: 724660
MDC IDC MSMT LEADCHNL LV IMPEDANCE VALUE: 593 Ohm
MDC IDC MSMT LEADCHNL LV PACING THRESHOLD AMPLITUDE: 1.1 V
MDC IDC MSMT LEADCHNL LV SENSING INTR AMPL: 16.5 mV
MDC IDC MSMT LEADCHNL RA SENSING INTR AMPL: 3.7 mV
MDC IDC MSMT LEADCHNL RV IMPEDANCE VALUE: 690 Ohm
MDC IDC MSMT LEADCHNL RV PACING THRESHOLD PULSEWIDTH: 0.4 ms
MDC IDC MSMT LEADCHNL RV SENSING INTR AMPL: 20.3 mV
MDC IDC SESS DTM: 20170112050000
MDC IDC SET LEADCHNL LV PACING AMPLITUDE: 3.5 V
MDC IDC SET LEADCHNL LV SENSING SENSITIVITY: 2.5 mV
MDC IDC SET LEADCHNL RV PACING PULSEWIDTH: 0.4 ms
MDC IDC SET LEADCHNL RV SENSING SENSITIVITY: 2.5 mV
Pulse Gen Serial Number: 714587

## 2015-07-09 NOTE — Progress Notes (Signed)
CRTP wound check appointment. Steri-strips removed. Wound without redness or edema. Incision edges approximated, wound well healed. Normal device function. RA and RV thresholds, sensing, and impedances consistent with implant measurements. LV threshold elevated. Multiple vectors tested- programmed LVR2 to [place] (previously LVR3 to [facility]). CXR ordered. Device programmed at 3.5V for extra safety margin until 3 month visit. Histogram distribution appropriate for patient and level of activity. No mode switches or high ventricular rates noted. Patient educated about wound care, arm mobility, lifting restrictions. I will call tomorrow after Dr. Lovena Le reviews findings. ROV with GT in April.

## 2015-07-10 ENCOUNTER — Telehealth: Payer: Self-pay | Admitting: *Deleted

## 2015-07-10 NOTE — Telephone Encounter (Signed)
Reviewed CXR with Dr. Lovena Le- no changes needed.  Spoke with patient- made aware that there was no significant change in lead position and Dr. Lovena Le did not want any changes made to pacemaker at this time. He is relieved and appreciative of call.

## 2015-07-15 ENCOUNTER — Encounter: Payer: Self-pay | Admitting: Internal Medicine

## 2015-07-16 ENCOUNTER — Ambulatory Visit (HOSPITAL_COMMUNITY)
Admission: RE | Admit: 2015-07-16 | Discharge: 2015-07-16 | Disposition: A | Discharge status: Home / Self Care | Payer: Medicare Other | Source: Ambulatory Visit | Attending: Internal Medicine | Admitting: Internal Medicine

## 2015-07-16 DIAGNOSIS — R55 Syncope and collapse: Secondary | ICD-10-CM | POA: Diagnosis not present

## 2015-07-23 LAB — EXERCISE TOLERANCE TEST
CHL CUP MPHR: 135 {beats}/min
CHL CUP RESTING HR STRESS: 62 {beats}/min
CHL RATE OF PERCEIVED EXERTION: 12
CSEPEDS: 3 s
CSEPHR: 88 %
Estimated workload: 7 METS
Exercise duration (min): 11 min
Peak HR: 120 {beats}/min

## 2015-08-13 ENCOUNTER — Encounter (INDEPENDENT_AMBULATORY_CARE_PROVIDER_SITE_OTHER): Payer: Self-pay | Admitting: *Deleted

## 2015-08-20 ENCOUNTER — Other Ambulatory Visit (INDEPENDENT_AMBULATORY_CARE_PROVIDER_SITE_OTHER): Payer: Self-pay | Admitting: *Deleted

## 2015-08-20 ENCOUNTER — Encounter (INDEPENDENT_AMBULATORY_CARE_PROVIDER_SITE_OTHER): Payer: Self-pay | Admitting: *Deleted

## 2015-08-20 DIAGNOSIS — Z86010 Personal history of colonic polyps: Secondary | ICD-10-CM

## 2015-08-20 NOTE — Telephone Encounter (Signed)
Patient needs trilyte 

## 2015-08-21 MED ORDER — PEG 3350-KCL-NA BICARB-NACL 420 G PO SOLR: 4000.0000 mL | Freq: Once | ORAL | Status: DC

## 2015-09-04 ENCOUNTER — Ambulatory Visit: Payer: Medicare Other | Admitting: Internal Medicine

## 2015-09-09 ENCOUNTER — Telehealth: Payer: Self-pay | Admitting: Internal Medicine

## 2015-09-09 NOTE — Telephone Encounter (Signed)
Spoke w/ pt and he informed me that he was having some difficulty breathing. Instructed pt to send a remote transmission w/ his home monitor and as soon as we get the transmission a device tech will review it and give him a call back. Pt verbalized understanding.

## 2015-09-09 NOTE — Telephone Encounter (Signed)
Called patient back to review symptoms.  Device transmission received and RR trend is stable, device functioning appropriately.  Patient reports noticing increased shortness of breath last night when he was getting ready for bed.  He also notes a feeling of fullness in his chest and orthopnea.  Patient denies edema, weight gain, chest pain or pressure, or dizziness.  He states that his HR and BP are controlled.  He states that today he mostly notices the shortness of breath with exertion.  Patient reports taking all medications as prescribed.  Advised that I will review symptoms with a provider and call him back.  Reviewed symptoms with Chanetta Marshall, NP, who advised that patient needs to see a flex provider.  Earliest available appointment is on 09/11/15 at 1:30pm.  Called patient to offer him this appointment and he is agreeable.  He is aware that it is at the [facility] office.  Advised patient to proceed to the ED in the interim if his symptoms worsen.  He verbalizes understanding of all instructions and denies any questions or concerns at this time.  Patient is appreciative of call.

## 2015-09-09 NOTE — Telephone Encounter (Signed)
Pt called and left a voicemail stating that his chest is bothering him a little bit & is wondering if he need to get his pace maker checked

## 2015-09-11 ENCOUNTER — Ambulatory Visit: Payer: Medicare Other | Admitting: Nurse Practitioner

## 2015-09-24 ENCOUNTER — Telehealth (INDEPENDENT_AMBULATORY_CARE_PROVIDER_SITE_OTHER): Payer: Self-pay | Admitting: *Deleted

## 2015-09-24 NOTE — Telephone Encounter (Signed)
Referring MD/PCP: fagan   Procedure: tcs  Reason/Indication:  Hx polyps  Has patient had this procedure before?  Yes, 2014 -- epic  If so, when, by whom and where?    Is there a family history of colon cancer?  no  Who?  What age when diagnosed?    Is patient diabetic?   no      Does patient have prosthetic heart valve or mechanical valve?  no  Do you have a pacemaker?  Yes, 05/2015  Has patient ever had endocarditis? no  Has patient had joint replacement within last 12 months?  no  Does patient tend to be constipated or take laxatives? no  Does patient have a history of alcohol/drug use?  no  Is patient on Coumadin, Plavix and/or Aspirin? yes  Medications: asa 81 mg daily, carvedilol 3.125 mg bid, ramipril 2.5 mg daily, pravastatin 40 mg 1/4 tab daily  Allergies: nkda  Medication Adjustment: asa 2 days  Procedure date & time: 10/21/15 at 1200

## 2015-09-24 NOTE — Telephone Encounter (Signed)
agree

## 2015-10-07 ENCOUNTER — Ambulatory Visit (INDEPENDENT_AMBULATORY_CARE_PROVIDER_SITE_OTHER): Payer: Medicare Other | Admitting: Internal Medicine

## 2015-10-07 ENCOUNTER — Encounter: Payer: Self-pay | Admitting: Internal Medicine

## 2015-10-07 VITALS — BP 122/60 | HR 65 | Ht 73.0 in | Wt 178.0 lb

## 2015-10-07 DIAGNOSIS — I5022 Chronic systolic (congestive) heart failure: Secondary | ICD-10-CM | POA: Diagnosis not present

## 2015-10-07 DIAGNOSIS — I429 Cardiomyopathy, unspecified: Secondary | ICD-10-CM | POA: Diagnosis not present

## 2015-10-07 DIAGNOSIS — R001 Bradycardia, unspecified: Secondary | ICD-10-CM | POA: Diagnosis not present

## 2015-10-07 DIAGNOSIS — I428 Other cardiomyopathies: Secondary | ICD-10-CM

## 2015-10-07 NOTE — Patient Instructions (Addendum)
Your physician wants you to follow-up in: 9 Months with Dr. Lovena Le.  You will receive a reminder letter in the mail two months in advance. If you don't receive a letter, please call our office to schedule the follow-up appointment.  Remote monitoring is used to monitor your Pacemaker of ICD from home. This monitoring reduces the number of office visits required to check your device to one time per year. It allows [place] to keep an eye on the functioning of your device to ensure it is working properly. You are scheduled for a device check from home on 01/06/16. You may send your transmission at any time that day. If you have a wireless device, the transmission will be sent automatically. After your physician reviews your transmission, you will receive a postcard with your next transmission date.  Your physician recommends that you continue on your current medications as directed. Please refer to the Current Medication list given to you today.  If you need a refill on your cardiac medications before your next appointment, please call your pharmacy.  Thank you for choosing [facility]!

## 2015-10-07 NOTE — Progress Notes (Signed)
HPI Max Cole is referred today for evaluation of syncope, s/p PPM insertion. He was hospitalized several weeks ago with syncope. He was noted to be bradycardic and hypotensive. His beta blocker was stopped. He was observed and his heart rate improved. He was discharged off of his beta blocker. He has LV dysfunction with an EF of 40% and also has LBBB. The patient developed recurrent symptoms and underwent BiV PPM insertion. He has done well in the interim. I strongly encouraged him to reduce his ETOH consumption and he has though he has not stopped drinking altogether. No Known Allergies  Current Outpatient Prescriptions  Medication Sig Dispense Refill  . aspirin 81 MG tablet Take 81 mg by mouth daily.    . carvedilol (COREG) 3.125 MG tablet Take 1 tablet (3.125 mg total) by mouth 2 (two) times daily with a meal. 60 tablet 11  . pravastatin (PRAVACHOL) 10 MG tablet Take 10 mg by mouth daily.    . ramipril (ALTACE) 2.5 MG tablet Take 1.25 mg by mouth daily. Take 1/2 tab daily     No current facility-administered medications for this visit.     Past Medical History  Diagnosis Date  . Cerebrovascular disease     Carotid ultrasound in 12/2010-60-79% left internal carotid artery, less than 40% or right RICA, no change  . Left bundle branch block   . Cardiomyopathy 2005    a. Possibly alcoholic; b. cath in 0000000 ostial D1, PCW of 12, EF of 35-40%;  c. EF of 0.25 in 11/2003;  d. 40-45% in 05/2004;  e. 25% in 10/2008 by echo;  f. 04/2015 Echo: EF 40-45%, diff HK, sev inflat/inf HK, Gr 1 DD, mild AI, triv TR.  Marland Kitchen Chronic kidney disease     Creatinine of 1.6 in 2009  . Hyperlipidemia 10/15/2011    Lipid profile in 03/2009:231, 136, 56, 148  . Carotid artery occlusion   . Prostate carcinoma ([place])   . Atrial fibrillation ([place])   . Bradycardia   . Syncope     Boston CRT-P 07/03/15 Dr. Lovena Le    ROS:   All systems reviewed and negative except as noted in the HPI.   Past Surgical  History  Procedure Laterality Date  . Cataract extraction      Right  . Colonoscopy w/ polypectomy  2010    Diverticulosis  . Colonoscopy N/A 08/16/2012    Procedure: COLONOSCOPY;  Surgeon: Rogene Houston, MD;  Location: [facility];  Service: Endoscopy;  Laterality: N/A;  930  . Eye surgery Right     Cataract  . Ep implantable device N/A 07/03/2015    Procedure: BiV Pacemaker Insertion CRT-P;  Surgeon: Evans Lance, MD;  Location: [facility];  Service: Cardiovascular;  Laterality: N/A;     Family History  Problem Relation Age of Onset  . Hypertension Mother      Social History   Social History  . Marital Status: Married    Spouse Name: N/A  . Number of Children: N/A  . Years of Education: N/A   Occupational History  . Not on file.   Social History Main Topics  . Smoking status: Former Smoker -- 1.00 packs/day for 30 years    Quit date: 09/28/1973  . Smokeless tobacco: Never Used  . Alcohol Use: 3.0 oz/week    5 Glasses of wine per week     Comment: History of excessive alcohol use; Alternates between glass of wine vs. Liquor  drink 5 days per week (04/2015)  . Drug Use: No  . Sexual Activity: Not on file   Other Topics Concern  . Not on file   Social History Narrative     BP 122/60 mmHg  Pulse 65  Ht 6\' 1"  (1.854 m)  Wt 178 lb (80.74 kg)  BMI 23.49 kg/m2  SpO2 99%  Physical Exam:  Well appearing 82 yo man, looks younger than his stated age, NAD HEENT: Unremarkable Neck:  6 cm JVD, no thyromegally Back:  No CVA tenderness Lungs:  Clear with no wheezes HEART:  Regular rate rhythm, no murmurs, no rubs, no clicks, split S2.  Abd:  soft, positive bowel sounds, no organomegally, no rebound, no guarding Ext:  2 plus pulses, no edema, no cyanosis, no clubbing Skin:  No rashes no nodules Neuro:  CN II through XII intact, motor grossly intact  EKG - NSR with LBBB  Assess/Plan: 1. Chronic systolic heart failure - his symptoms appear to be class  1. He will continue his current meds.  2. ETOH abuse - he is improved though he has not stopped drinking altogether. I have encouraged him to reduce his consumption 3. HTN - his blood pressure today is well controlled. No change in meds. 4. PPM - his [place] Sci BiV PPM is working normally. Will recheck in several months.  Cristopher Peru, M.D.

## 2015-10-08 LAB — CUP PACEART INCLINIC DEVICE CHECK
Date Time Interrogation Session: 20170413085759
Implantable Lead Implant Date: 20170106
Implantable Lead Implant Date: 20170106
Implantable Lead Location: 753859
Implantable Lead Model: 4677
Implantable Lead Serial Number: 695539
Lead Channel Impedance Value: 578 Ohm
Lead Channel Pacing Threshold Amplitude: 0.5 V
Lead Channel Sensing Intrinsic Amplitude: 2.7 mV
Lead Channel Sensing Intrinsic Amplitude: 21 mV
Lead Channel Setting Pacing Amplitude: 2.5 V
Lead Channel Setting Pacing Amplitude: 3.5 V
Lead Channel Setting Pacing Pulse Width: 0.4 ms
Lead Channel Setting Pacing Pulse Width: 0.4 ms
Lead Channel Setting Sensing Sensitivity: 2.5 mV
MDC IDC LEAD IMPLANT DT: 20170106
MDC IDC LEAD LOCATION: 753858
MDC IDC LEAD LOCATION: 753860
MDC IDC LEAD SERIAL: 507382
MDC IDC LEAD SERIAL: 724660
MDC IDC MSMT BATTERY REMAINING LONGEVITY: 132 mo
MDC IDC MSMT LEADCHNL LV PACING THRESHOLD AMPLITUDE: 1.6 V
MDC IDC MSMT LEADCHNL LV PACING THRESHOLD PULSEWIDTH: 0.4 ms
MDC IDC MSMT LEADCHNL LV SENSING INTR AMPL: 24.1 mV
MDC IDC MSMT LEADCHNL RA IMPEDANCE VALUE: 713 Ohm
MDC IDC MSMT LEADCHNL RA PACING THRESHOLD AMPLITUDE: 1.2 V
MDC IDC MSMT LEADCHNL RA PACING THRESHOLD PULSEWIDTH: 0.4 ms
MDC IDC MSMT LEADCHNL RV IMPEDANCE VALUE: 788 Ohm
MDC IDC MSMT LEADCHNL RV PACING THRESHOLD PULSEWIDTH: 0.4 ms
MDC IDC SET LEADCHNL LV SENSING SENSITIVITY: 2.5 mV
MDC IDC SET LEADCHNL RV PACING AMPLITUDE: 2 V
MDC IDC STAT BRADY RA PERCENT PACED: 46 %
MDC IDC STAT BRADY RV PERCENT PACED: 98 %
Pulse Gen Serial Number: 714587

## 2015-10-21 ENCOUNTER — Ambulatory Visit (HOSPITAL_COMMUNITY)
Admission: RE | Admit: 2015-10-21 | Discharge: 2015-10-21 | Disposition: A | Discharge status: Home / Self Care | Payer: Medicare Other | Source: Ambulatory Visit | Attending: Internal Medicine | Admitting: Internal Medicine

## 2015-10-21 ENCOUNTER — Encounter (HOSPITAL_COMMUNITY): Payer: Self-pay | Admitting: *Deleted

## 2015-10-21 ENCOUNTER — Encounter (HOSPITAL_COMMUNITY)
Admission: RE | Disposition: A | Discharge status: Home / Self Care | Payer: Self-pay | Source: Ambulatory Visit | Attending: Internal Medicine

## 2015-10-21 DIAGNOSIS — I6789 Other cerebrovascular disease: Secondary | ICD-10-CM | POA: Diagnosis not present

## 2015-10-21 DIAGNOSIS — Z09 Encounter for follow-up examination after completed treatment for conditions other than malignant neoplasm: Secondary | ICD-10-CM | POA: Diagnosis not present

## 2015-10-21 DIAGNOSIS — I4891 Unspecified atrial fibrillation: Secondary | ICD-10-CM | POA: Insufficient documentation

## 2015-10-21 DIAGNOSIS — Z1211 Encounter for screening for malignant neoplasm of colon: Secondary | ICD-10-CM | POA: Insufficient documentation

## 2015-10-21 DIAGNOSIS — Z87891 Personal history of nicotine dependence: Secondary | ICD-10-CM | POA: Insufficient documentation

## 2015-10-21 DIAGNOSIS — K5730 Diverticulosis of large intestine without perforation or abscess without bleeding: Secondary | ICD-10-CM | POA: Insufficient documentation

## 2015-10-21 DIAGNOSIS — I429 Cardiomyopathy, unspecified: Secondary | ICD-10-CM | POA: Insufficient documentation

## 2015-10-21 DIAGNOSIS — Z8546 Personal history of malignant neoplasm of prostate: Secondary | ICD-10-CM | POA: Diagnosis not present

## 2015-10-21 DIAGNOSIS — K6289 Other specified diseases of anus and rectum: Secondary | ICD-10-CM | POA: Diagnosis not present

## 2015-10-21 DIAGNOSIS — Z7982 Long term (current) use of aspirin: Secondary | ICD-10-CM | POA: Insufficient documentation

## 2015-10-21 DIAGNOSIS — E785 Hyperlipidemia, unspecified: Secondary | ICD-10-CM | POA: Diagnosis not present

## 2015-10-21 DIAGNOSIS — D125 Benign neoplasm of sigmoid colon: Secondary | ICD-10-CM | POA: Insufficient documentation

## 2015-10-21 DIAGNOSIS — D123 Benign neoplasm of transverse colon: Secondary | ICD-10-CM | POA: Diagnosis not present

## 2015-10-21 DIAGNOSIS — Z950 Presence of cardiac pacemaker: Secondary | ICD-10-CM | POA: Diagnosis not present

## 2015-10-21 DIAGNOSIS — Z86010 Personal history of colonic polyps: Secondary | ICD-10-CM

## 2015-10-21 DIAGNOSIS — N189 Chronic kidney disease, unspecified: Secondary | ICD-10-CM | POA: Insufficient documentation

## 2015-10-21 DIAGNOSIS — Z79899 Other long term (current) drug therapy: Secondary | ICD-10-CM | POA: Diagnosis not present

## 2015-10-21 HISTORY — PX: COLONOSCOPY: SHX5424

## 2015-10-21 SURGERY — COLONOSCOPY
Anesthesia: Moderate Sedation

## 2015-10-21 MED ORDER — SODIUM CHLORIDE 0.9 % IV SOLN
INTRAVENOUS | Status: DC
  Administered 2015-10-21: 1000 mL via INTRAVENOUS

## 2015-10-21 MED ORDER — MIDAZOLAM HCL 5 MG/5ML IJ SOLN
INTRAMUSCULAR | Status: AC
  Filled 2015-10-21: qty 10

## 2015-10-21 MED ORDER — MEPERIDINE HCL 50 MG/ML IJ SOLN
INTRAMUSCULAR | Status: DC | PRN
  Administered 2015-10-21: 25 mg

## 2015-10-21 MED ORDER — STERILE WATER FOR IRRIGATION IR SOLN
Status: DC | PRN
  Administered 2015-10-21: 12:00:00

## 2015-10-21 MED ORDER — MEPERIDINE HCL 50 MG/ML IJ SOLN
INTRAMUSCULAR | Status: AC
  Filled 2015-10-21: qty 1

## 2015-10-21 MED ORDER — MIDAZOLAM HCL 5 MG/5ML IJ SOLN
INTRAMUSCULAR | Status: DC | PRN
  Administered 2015-10-21: 1 mg via INTRAVENOUS
  Administered 2015-10-21: 2 mg via INTRAVENOUS
  Administered 2015-10-21: 1 mg via INTRAVENOUS

## 2015-10-21 NOTE — Op Note (Signed)
[facility] Patient Name: Max Cole Procedure Date: 10/21/2015 12:05 PM MRN: [id number] Date of Birth: 09-10-1927 Attending MD: Hildred Laser , MD CSN: [id number] Age: 82 Admit Type: Outpatient Procedure:                Colonoscopy Indications:              Surveillance: Personal history of adenomatous                            polyps on last colonoscopy 3 years ago Providers:                Hildred Laser, MD, Gwenlyn Fudge, RN, Georgeann Oppenheim,                            Technician Referring MD:             Asencion Noble Medicines:                Meperidine 25 mg IV, Midazolam 4 mg IV Complications:            No immediate complications. Estimated Blood Loss:     Estimated blood loss was minimal. Procedure:                Pre-Anesthesia Assessment:                           - Prior to the procedure, a History and Physical                            was performed, and patient medications and                            allergies were reviewed. The patient's tolerance of                            previous anesthesia was also reviewed. The risks                            and benefits of the procedure and the sedation                            options and risks were discussed with the patient.                            All questions were answered, and informed consent                            was obtained. Prior Anticoagulants: The patient                            last took aspirin 2 days prior to the procedure.                            ASA Grade Assessment: III - A patient with severe  systemic disease. After reviewing the risks and                            benefits, the patient was deemed in satisfactory                            condition to undergo the procedure.                           After obtaining informed consent, the colonoscope                            was passed under direct vision. Throughout the                             procedure, the patient's blood pressure, pulse, and                            oxygen saturations were monitored continuously. The                            [id number]) scope was introduced through                            the anus and advanced to the the cecum, identified                            by appendiceal orifice and ileocecal valve. The                            colonoscopy was performed without difficulty. The                            patient tolerated the procedure well. The quality                            of the bowel preparation was adequate. The                            ileocecal valve, appendiceal orifice, and rectum                            were photographed. Scope In: 12:27:38 PM Scope Out: 12:57:33 PM Scope Withdrawal Time: 0 hours 18 minutes 19 seconds  Total Procedure Duration: 0 hours 29 minutes 55 seconds  Findings:      A 9 mm polyp was found in the hepatic flexure. The polyp was sessile.       The polyp was removed with a hot snare. Resection and retrieval were       complete.      A 6 mm polyp was found in the splenic flexure. The polyp was sessile.       The polyp was removed with a cold snare. Resection and retrieval were       complete.      A 6 mm polyp was  found in the sigmoid colon. The polyp was sessile. The       polyp was removed with a hot snare. Resection and retrieval were       complete.      Two sessile polyps were found in the sigmoid colon and transverse colon.       The polyps were 3 to 4 mm in size. Fulguration to ablate the lesion by       monopolar probe was successful.      A 3 mm polyp was found in the sigmoid colon. The polyp was sessile. The       polyp was removed with a cold snare. Polyp resection was incomplete, and       the resected tissue was not retrieved.      Scattered medium-mouthed diverticula were found in the sigmoid colon and       transverse colon.      Anal papilla(e) were  hypertrophied. Impression:               - One 9 mm polyp at the hepatic flexure, removed                            with a hot snare. Resected and retrieved.                           - One 6 mm polyp at the splenic flexure, removed                            with a cold snare. Resected and retrieved.                           - One 6 mm polyp in the sigmoid colon, removed with                            a hot snare. Resected and retrieved.                           - Two 3 to 4 mm polyps in the sigmoid colon and in                            the transverse colon. Treated with a monopolar                            probe.                           - One 3 mm polyp in the sigmoid colon, removed with                            a cold snare. Incomplete resection. Resected tissue                            not retrieved.                           - Diverticulosis in the sigmoid colon and in the  transverse colon.                           - Anal papilla(e) were hypertrophied. Moderate Sedation:      Moderate (conscious) sedation was administered by the endoscopy nurse       and supervised by the endoscopist. The following parameters were       monitored: oxygen saturation, heart rate, blood pressure, CO2       capnography and response to care. Total physician intraservice time was       36 minutes. Recommendation:           - Patient has a contact number available for                            emergencies. The signs and symptoms of potential                            delayed complications were discussed with the                            patient. Return to normal activities tomorrow.                            Written discharge instructions were provided to the                            patient.                           - High fiber diet today.                           - Continue present medications.                           - No aspirin, ibuprofen,  naproxen, or other                            non-steroidal anti-inflammatory drugs for 7 days                            after polyp removal.                           - Await pathology results.                           - Repeat colonoscopy PRN for surveillance. Procedure Code(s):        --- Professional ---                           [phone], Colonoscopy, flexible; with ablation of                            tumor(s), polyp(s), or other lesion(s) (includes  pre- and post-dilation and guide wire passage, when                            performed)                           45385, 59, Colonoscopy, flexible; with removal of                            tumor(s), polyp(s), or other lesion(s) by snare                            technique                           99152, Moderate sedation services provided by the                            same physician or other qualified health care                            professional performing the diagnostic or                            therapeutic service that the sedation supports,                            requiring the presence of an independent trained                            observer to assist in the monitoring of the                            patient's level of consciousness and physiological                            status; initial 15 minutes of intraservice time,                            patient age 32 years or older                           [id number], Moderate sedation services; each additional                            15 minutes intraservice time Diagnosis Code(s):        --- Professional ---                           Z86.010, Personal history of colonic polyps                           D12.3, Benign neoplasm of transverse colon (hepatic                            flexure  or splenic flexure)                           D12.5, Benign neoplasm of sigmoid colon                           K62.89, Other specified  diseases of anus and rectum                           K57.30, Diverticulosis of large intestine without                            perforation or abscess without bleeding CPT copyright 2016 American [employer] Association. All rights reserved. The codes documented in this report are preliminary and upon coder review may  be revised to meet current compliance requirements. Hildred Laser, MD Hildred Laser, MD 10/21/2015 1:08:49 PM This report has been signed electronically. Number of Addenda: 0

## 2015-10-21 NOTE — H&P (Signed)
Max Cole is an 82 y.o. male.   Chief Complaint: Patient is here for colonoscopy. HPI: Patient is 82 year old Caucasian male with history of multiple colonic adenomas and is here for surveillance colonoscopy. Last colonoscopy was in 01/13/2013 with removal of 9 polyps and he had over 20 polyps removed on prior colonoscopy of 2010. He denies abdominal pain change in bowel habits or rectal bleeding. He's been off aspirin for 2 days. Family history is negative for CRC.  Past Medical History  Diagnosis Date  . Cerebrovascular disease     Carotid ultrasound in 12/2010-60-79% left internal carotid artery, less than 40% or right RICA, no change  . Left bundle branch block   . Cardiomyopathy 2005    a. Possibly alcoholic; b. cath in 0000000 ostial D1, PCW of 12, EF of 35-40%;  c. EF of 0.25 in 11/2003;  d. 40-45% in 05/2004;  e. 25% in 10/2008 by echo;  f. 04/2015 Echo: EF 40-45%, diff HK, sev inflat/inf HK, Gr 1 DD, mild AI, triv TR.  Marland Kitchen Chronic kidney disease     Creatinine of 1.6 in 2009  . Hyperlipidemia 10/15/2011    Lipid profile in 03/2009:231, 136, 56, 148  . Carotid artery occlusion   . Prostate carcinoma ([place])   . Atrial fibrillation ([place])   . Bradycardia   . Syncope     [place] CRT-P 07/03/15 Dr. Lovena Le    Past Surgical History  Procedure Laterality Date  . Cataract extraction      Right  . Colonoscopy w/ polypectomy  2010    Diverticulosis  . Colonoscopy N/A 08/16/2012    Procedure: COLONOSCOPY;  Surgeon: Rogene Houston, MD;  [facility];  Service: Endoscopy;  Laterality: N/A;  930  . Eye surgery Right     Cataract  . Ep implantable device N/A 07/03/2015    Procedure: BiV Pacemaker Insertion CRT-P;  Surgeon: Evans Lance, MD;  Location: [place] [facility];  Service: Cardiovascular;  Laterality: N/A;  . Hernia repair      Family History  Problem Relation Age of Onset  . Hypertension Mother    Social History:  reports that he quit smoking about 42 years ago. He  has never used smokeless tobacco. He reports that he drinks about 3.0 oz of alcohol per week. He reports that he does not use illicit drugs.  Allergies: No Known Allergies  Medications Prior to Admission  Medication Sig Dispense Refill  . aspirin 81 MG tablet Take 81 mg by mouth daily.    . carvedilol (COREG) 3.125 MG tablet Take 1 tablet (3.125 mg total) by mouth 2 (two) times daily with a meal. 60 tablet 11  . pravastatin (PRAVACHOL) 10 MG tablet Take 10 mg by mouth daily.    . ramipril (ALTACE) 2.5 MG tablet Take 1.25 mg by mouth daily. Take 1/2 tab daily      No results found for this or any previous visit (from the past 48 hour(s)). No results found.  ROS  Blood pressure 161/69, pulse 74, temperature 97.8 F (36.6 C), temperature source Oral, resp. rate 17, height 6\' 1"  (1.854 m), weight 175 lb (79.379 kg), SpO2 100 %. Physical Exam  Constitutional:  Well-developed thin Caucasian male in NAD.  HENT:  Mouth/Throat: Oropharynx is clear and moist.  Eyes: Conjunctivae are normal. No scleral icterus.  Neck: No thyromegaly present.  Cardiovascular:  Intermittent irregularity. Normal S1 and S2. No murmur or gallop.  Respiratory: Effort normal and breath sounds  normal.  GI: Soft. He exhibits no distension and no mass. There is no tenderness.  Musculoskeletal: He exhibits no edema.  Lymphadenopathy:    He has no cervical adenopathy.  Neurological: He is alert.  Skin: Skin is warm and dry.     Assessment/Plan History of multiple colonic adenomas. Surveillance colonoscopy.  Rogene Houston, MD 10/21/2015, 12:15 PM

## 2015-10-21 NOTE — Discharge Instructions (Signed)
No aspirin or NSAIDs for 1 week. Resume other medications and high fiber diet. No driving for 24 hours. Physician will call with biopsy results.  Colonoscopy, Care After These instructions give you information on caring for yourself after your procedure. Your doctor may also give you more specific instructions. Call your doctor if you have any problems or questions after your procedure. HOME CARE  Do not drive for 24 hours.  Do not sign important papers or use machinery for 24 hours.  You may shower.  You may go back to your usual activities, but go slower for the first 24 hours.  Take rest breaks often during the first 24 hours.  Walk around or use warm packs on your belly (abdomen) if you have belly cramping or gas.  Drink enough fluids to keep your pee (urine) clear or pale yellow.  Resume your normal diet. Avoid heavy or fried foods.  Avoid drinking alcohol for 24 hours or as told by your doctor.  Only take medicines as told by your doctor. If a tissue sample (biopsy) was taken during the procedure:   Do not take aspirin or blood thinners for 7 days, or as told by your doctor.  Do not drink alcohol for 7 days, or as told by your doctor.  Eat soft foods for the first 24 hours. GET HELP IF: You still have a small amount of blood in your poop (stool) 2-3 days after the procedure. GET HELP RIGHT AWAY IF:  You have more than a small amount of blood in your poop.  You see clumps of tissue (blood clots) in your poop.  Your belly is puffy (swollen).  You feel sick to your stomach (nauseous) or throw up (vomit).  You have a fever.  You have belly pain that gets worse and medicine does not help. MAKE SURE YOU:  Understand these instructions.  Will watch your condition.  Will get help right away if you are not doing well or get worse.   This information is not intended to replace advice given to you by your health care provider. Make sure you discuss any questions  you have with your health care provider.   Document Released: 07/16/2010 Document Revised: 06/18/2013 Document Reviewed: 02/18/2013 Elsevier Interactive Patient Education 2016 Elsevier Inc.  Colon Polyps Polyps are lumps of extra tissue growing inside the body. Polyps can grow in the large intestine (colon). Most colon polyps are noncancerous (benign). However, some colon polyps can become cancerous over time. Polyps that are larger than a pea may be harmful. To be safe, caregivers remove and test all polyps. CAUSES  Polyps form when mutations in the genes cause your cells to grow and divide even though no more tissue is needed. RISK FACTORS There are a number of risk factors that can increase your chances of getting colon polyps. They include:  Being older than 50 years.  Family history of colon polyps or colon cancer.  Long-term colon diseases, such as colitis or Crohn disease.  Being overweight.  Smoking.  Being inactive.  Drinking too much alcohol. SYMPTOMS  Most small polyps do not cause symptoms. If symptoms are present, they may include:  Blood in the stool. The stool may look dark red or black.  Constipation or diarrhea that lasts longer than 1 week. DIAGNOSIS People often do not know they have polyps until their caregiver finds them during a regular checkup. Your caregiver can use 4 tests to check for polyps:  Digital rectal exam. The caregiver  wears gloves and feels inside the rectum. This test would find polyps only in the rectum.  Barium enema. The caregiver puts a liquid called barium into your rectum before taking X-rays of your colon. Barium makes your colon look white. Polyps are dark, so they are easy to see in the X-ray pictures.  Sigmoidoscopy. A thin, flexible tube (sigmoidoscope) is placed into your rectum. The sigmoidoscope has a light and tiny camera in it. The caregiver uses the sigmoidoscope to look at the last third of your colon.  Colonoscopy. This  test is like sigmoidoscopy, but the caregiver looks at the entire colon. This is the most common method for finding and removing polyps. TREATMENT  Any polyps will be removed during a sigmoidoscopy or colonoscopy. The polyps are then tested for cancer. PREVENTION  To help lower your risk of getting more colon polyps:  Eat plenty of fruits and vegetables. Avoid eating fatty foods.  Do not smoke.  Avoid drinking alcohol.  Exercise every day.  Lose weight if recommended by your caregiver.  Eat plenty of calcium and folate. Foods that are rich in calcium include milk, cheese, and broccoli. Foods that are rich in folate include chickpeas, kidney beans, and spinach. HOME CARE INSTRUCTIONS Keep all follow-up appointments as directed by your caregiver. You may need periodic exams to check for polyps. SEEK MEDICAL CARE IF: You notice bleeding during a bowel movement.   This information is not intended to replace advice given to you by your health care provider. Make sure you discuss any questions you have with your health care provider.   Document Released: 03/09/2004 Document Revised: 07/04/2014 Document Reviewed: 08/23/2011 Elsevier Interactive Patient Education [employer].

## 2015-10-23 ENCOUNTER — Encounter (HOSPITAL_COMMUNITY): Payer: Self-pay | Admitting: Internal Medicine

## 2015-10-26 ENCOUNTER — Telehealth: Payer: Self-pay | Admitting: Internal Medicine

## 2015-10-26 MED ORDER — CARVEDILOL 3.125 MG PO TABS: 3.1250 mg | ORAL_TABLET | Freq: Two times a day (BID) | ORAL | Status: DC

## 2015-10-26 NOTE — Telephone Encounter (Signed)
Pt is wondering if he can get 90 day supply on his   carvedilol (COREG) 3.125 MG tablet [id number]

## 2015-10-26 NOTE — Telephone Encounter (Signed)
90day supply sent.

## 2015-12-30 DIAGNOSIS — N189 Chronic kidney disease, unspecified: Secondary | ICD-10-CM | POA: Diagnosis not present

## 2015-12-30 DIAGNOSIS — I779 Disorder of arteries and arterioles, unspecified: Secondary | ICD-10-CM | POA: Diagnosis not present

## 2015-12-30 DIAGNOSIS — Z79899 Other long term (current) drug therapy: Secondary | ICD-10-CM | POA: Diagnosis not present

## 2016-01-06 ENCOUNTER — Telehealth: Payer: Self-pay | Admitting: Cardiology

## 2016-01-06 ENCOUNTER — Ambulatory Visit (INDEPENDENT_AMBULATORY_CARE_PROVIDER_SITE_OTHER): Payer: Medicare Other | Admitting: *Deleted

## 2016-01-06 DIAGNOSIS — Z950 Presence of cardiac pacemaker: Secondary | ICD-10-CM | POA: Diagnosis not present

## 2016-01-06 DIAGNOSIS — I429 Cardiomyopathy, unspecified: Secondary | ICD-10-CM

## 2016-01-06 DIAGNOSIS — I428 Other cardiomyopathies: Secondary | ICD-10-CM

## 2016-01-06 NOTE — Telephone Encounter (Signed)
Spoke with pt and reminded pt of remote transmission that is due today. Pt verbalized understanding.   

## 2016-01-06 NOTE — Progress Notes (Signed)
Remote pacemaker transmission.   

## 2016-01-07 DIAGNOSIS — N183 Chronic kidney disease, stage 3 (moderate): Secondary | ICD-10-CM | POA: Diagnosis not present

## 2016-01-07 DIAGNOSIS — R55 Syncope and collapse: Secondary | ICD-10-CM | POA: Diagnosis not present

## 2016-01-08 ENCOUNTER — Encounter: Payer: Self-pay | Admitting: Cardiology

## 2016-01-12 LAB — CUP PACEART REMOTE DEVICE CHECK
Battery Remaining Longevity: 132 mo
Brady Statistic RA Percent Paced: 34 %
Brady Statistic RV Percent Paced: 97 %
Implantable Lead Implant Date: 20170106
Implantable Lead Location: 753859
Implantable Lead Model: 7742
Implantable Lead Serial Number: 695539
Implantable Lead Serial Number: 724660
Lead Channel Impedance Value: 592 Ohm
Lead Channel Impedance Value: 712 Ohm
Lead Channel Impedance Value: 760 Ohm
Lead Channel Pacing Threshold Amplitude: 0.7 V
Lead Channel Setting Pacing Amplitude: 2 V
Lead Channel Setting Pacing Amplitude: 2.5 V
Lead Channel Setting Pacing Pulse Width: 0.4 ms
Lead Channel Setting Sensing Sensitivity: 2.5 mV
MDC IDC LEAD IMPLANT DT: 20170106
MDC IDC LEAD IMPLANT DT: 20170106
MDC IDC LEAD LOCATION: 753858
MDC IDC LEAD LOCATION: 753860
MDC IDC LEAD SERIAL: 507382
MDC IDC MSMT BATTERY REMAINING PERCENTAGE: 100 %
MDC IDC MSMT LEADCHNL LV PACING THRESHOLD AMPLITUDE: 1.6 V
MDC IDC MSMT LEADCHNL LV PACING THRESHOLD PULSEWIDTH: 0.4 ms
MDC IDC MSMT LEADCHNL RV PACING THRESHOLD PULSEWIDTH: 0.4 ms
MDC IDC PG SERIAL: 714587
MDC IDC SESS DTM: 20170712163200
MDC IDC SET LEADCHNL LV PACING AMPLITUDE: 2.6 V
MDC IDC SET LEADCHNL RV PACING PULSEWIDTH: 0.4 ms
MDC IDC SET LEADCHNL RV SENSING SENSITIVITY: 2.5 mV

## 2016-01-14 ENCOUNTER — Encounter: Payer: Self-pay | Admitting: Vascular Surgery

## 2016-01-19 ENCOUNTER — Ambulatory Visit (HOSPITAL_COMMUNITY)
Admission: RE | Admit: 2016-01-19 | Discharge: 2016-01-19 | Disposition: A | Discharge status: Home / Self Care | Payer: Medicare Other | Source: Ambulatory Visit | Attending: Family | Admitting: Family

## 2016-01-19 ENCOUNTER — Encounter: Payer: Self-pay | Admitting: Family

## 2016-01-19 ENCOUNTER — Ambulatory Visit (INDEPENDENT_AMBULATORY_CARE_PROVIDER_SITE_OTHER): Payer: Medicare Other | Admitting: Family

## 2016-01-19 VITALS — BP 124/63 | HR 68 | Temp 98.2°F | Ht 73.0 in | Wt 176.5 lb

## 2016-01-19 DIAGNOSIS — I6523 Occlusion and stenosis of bilateral carotid arteries: Secondary | ICD-10-CM

## 2016-01-19 DIAGNOSIS — Z87891 Personal history of nicotine dependence: Secondary | ICD-10-CM

## 2016-01-19 DIAGNOSIS — N189 Chronic kidney disease, unspecified: Secondary | ICD-10-CM | POA: Insufficient documentation

## 2016-01-19 DIAGNOSIS — E785 Hyperlipidemia, unspecified: Secondary | ICD-10-CM | POA: Insufficient documentation

## 2016-01-19 LAB — VAS US CAROTID
LCCADSYS: 100 cm/s
LEFT ECA DIAS: 11 cm/s
LICADSYS: -87 cm/s
LICAPSYS: -310 cm/s
Left CCA dist dias: 16 cm/s
Left CCA prox dias: 16 cm/s
Left CCA prox sys: 114 cm/s
Left ICA dist dias: -26 cm/s
Left ICA prox dias: -82 cm/s
RCCADSYS: -126 cm/s
RCCAPSYS: 119 cm/s
RIGHT CCA MID DIAS: 13 cm/s
RIGHT ECA DIAS: -9 cm/s
Right CCA prox dias: 13 cm/s

## 2016-01-19 NOTE — Patient Instructions (Signed)
Stroke Prevention Some medical conditions and behaviors are associated with an increased chance of having a stroke. You may prevent a stroke by making healthy choices and managing medical conditions. HOW CAN I REDUCE MY RISK OF HAVING A STROKE?   Stay physically active. Get at least 30 minutes of activity on most or all days.  Do not smoke. It may also be helpful to avoid exposure to secondhand smoke.  Limit alcohol use. Moderate alcohol use is considered to be:  No more than 2 drinks per day for men.  No more than 1 drink per day for nonpregnant women.  Eat healthy foods. This involves:  Eating 5 or more servings of fruits and vegetables a day.  Making dietary changes that address high blood pressure (hypertension), high cholesterol, diabetes, or obesity.  Manage your cholesterol levels.  Making food choices that are high in fiber and low in saturated fat, trans fat, and cholesterol may control cholesterol levels.  Take any prescribed medicines to control cholesterol as directed by your health care provider.  Manage your diabetes.  Controlling your carbohydrate and sugar intake is recommended to manage diabetes.  Take any prescribed medicines to control diabetes as directed by your health care provider.  Control your hypertension.  Making food choices that are low in salt (sodium), saturated fat, trans fat, and cholesterol is recommended to manage hypertension.  Ask your health care provider if you need treatment to lower your blood pressure. Take any prescribed medicines to control hypertension as directed by your health care provider.  If you are 18-39 years of age, have your blood pressure checked every 3-5 years. If you are 40 years of age or older, have your blood pressure checked every year.  Maintain a healthy weight.  Reducing calorie intake and making food choices that are low in sodium, saturated fat, trans fat, and cholesterol are recommended to manage  weight.  Stop drug abuse.  Avoid taking birth control pills.  Talk to your health care provider about the risks of taking birth control pills if you are over 35 years old, smoke, get migraines, or have ever had a blood clot.  Get evaluated for sleep disorders (sleep apnea).  Talk to your health care provider about getting a sleep evaluation if you snore a lot or have excessive sleepiness.  Take medicines only as directed by your health care provider.  For some people, aspirin or blood thinners (anticoagulants) are helpful in reducing the risk of forming abnormal blood clots that can lead to stroke. If you have the irregular heart rhythm of atrial fibrillation, you should be on a blood thinner unless there is a good reason you cannot take them.  Understand all your medicine instructions.  Make sure that other conditions (such as anemia or atherosclerosis) are addressed. SEEK IMMEDIATE MEDICAL CARE IF:   You have sudden weakness or numbness of the face, arm, or leg, especially on one side of the body.  Your face or eyelid droops to one side.  You have sudden confusion.  You have trouble speaking (aphasia) or understanding.  You have sudden trouble seeing in one or both eyes.  You have sudden trouble walking.  You have dizziness.  You have a loss of balance or coordination.  You have a sudden, severe headache with no known cause.  You have new chest pain or an irregular heartbeat. Any of these symptoms may represent a serious problem that is an emergency. Do not wait to see if the symptoms will   go away. Get medical help at once. Call your local emergency services (911 in [place] Do not drive yourself to the hospital.   This information is not intended to replace advice given to you by your health care provider. Make sure you discuss any questions you have with your health care provider.   Document Released: 07/21/2004 Document Revised: 07/04/2014 Document Reviewed:  12/14/2012 Elsevier Interactive Patient Education 2016 Elsevier Inc.  

## 2016-01-19 NOTE — Progress Notes (Signed)
Chief Complaint: Follow up Extracranial Carotid Artery Stenosis   History of Present Illness  Max Cole is a 82 y.o. male patient of Dr. Donnetta Hutching who has known carotid artery stenosis. He returns today for follow up.  Patient has not had previous carotid artery intervention.   He walks 2 miles, 5 days/week, he works out in addition to this. He has a large yard and does his own yard work.   The patient has no history of TIA or stroke symptom. The patient denies a history of amaurosis fugax or monocular blindness, unilateral facial drooping, hemiplegia, or receptive or expressive aphasia.  Patient denies claudication symptoms with walking, denies non-healing wounds.   He had a syncopal episode in November 2016, states his heart rate decreased to 35/min at times; he knew he needed a pacemaker; he had a pacemaker insertion 07/03/15 by Dr. Lovena Le.   Pt Diabetic: No  Pt smoker: smoked off and on for 20+ years, quit in the 1970's   Pt meds include:  Statin : Yes  Betablocker: no ASA: Yes  Other anticoagulants/antiplatelets: no      Past Medical History:  Diagnosis Date  . Atrial fibrillation ([place])   . Bradycardia   . Cardiomyopathy 2005   a. Possibly alcoholic; b. cath in 0000000 ostial D1, PCW of 12, EF of 35-40%;  c. EF of 0.25 in 11/2003;  d. 40-45% in 05/2004;  e. 25% in 10/2008 by echo;  f. 04/2015 Echo: EF 40-45%, diff HK, sev inflat/inf HK, Gr 1 DD, mild AI, triv TR.  . Carotid artery occlusion   . Cerebrovascular disease    Carotid ultrasound in 12/2010-60-79% left internal carotid artery, less than 40% or right RICA, no change  . Chronic kidney disease    Creatinine of 1.6 in 2009  . Hyperlipidemia 10/15/2011   Lipid profile in 03/2009:231, 136, 56, 148  . Left bundle branch block   . Prostate carcinoma ([place])   . Syncope    [place] CRT-P 07/03/15 Dr. Lovena Le    Social History Social History  Substance Use Topics  . Smoking status: Former Smoker     Packs/day: 1.00    Years: 30.00    Quit date: 09/28/1973  . Smokeless tobacco: Never Used  . Alcohol use 3.0 oz/week    5 Glasses of wine per week     Comment: History of excessive alcohol use; Alternates between glass of wine vs. Liquor drink 5 days per week (04/2015)    Family History Family History  Problem Relation Age of Onset  . Hypertension Mother     Surgical History Past Surgical History:  Procedure Laterality Date  . CATARACT EXTRACTION     Right  . COLONOSCOPY N/A 08/16/2012   Procedure: COLONOSCOPY;  Surgeon: Rogene Houston, MD;  [facility];  Service: Endoscopy;  Laterality: N/A;  930  . COLONOSCOPY N/A 10/21/2015   Procedure: COLONOSCOPY;  Surgeon: Rogene Houston, MD;  [facility];  Service: Endoscopy;  Laterality: N/A;  1200  . COLONOSCOPY W/ POLYPECTOMY  2010   Diverticulosis  . EP IMPLANTABLE DEVICE N/A 07/03/2015   Procedure: BiV Pacemaker Insertion CRT-P;  Surgeon: Evans Lance, MD;  Location: [place] [facility];  Service: Cardiovascular;  Laterality: N/A;  . EYE SURGERY Right    Cataract  . HERNIA REPAIR      No Known Allergies  Current Outpatient Prescriptions  Medication Sig Dispense Refill  . aspirin 81 MG tablet Take 1 tablet (  81 mg total) by mouth daily. 30 tablet   . carvedilol (COREG) 3.125 MG tablet Take 1 tablet (3.125 mg total) by mouth 2 (two) times daily with a meal. 180 tablet 3  . pravastatin (PRAVACHOL) 10 MG tablet Take 10 mg by mouth daily.    . ramipril (ALTACE) 2.5 MG tablet Take 1.25 mg by mouth daily. Take 1/2 tab daily     No current facility-administered medications for this visit.     Review of Systems : See HPI for pertinent positives and negatives.  Physical Examination  Vitals:   01/19/16 1454  BP: 124/63  Pulse: 68  Temp: 98.2 F (36.8 C)  Weight: 176 lb 8 oz (80.1 kg)  Height: 6\' 1"  (1.854 m)   Body mass index is 23.29 kg/m.  General: WDWN male in NAD, fit appearing elderly  male. GAIT: normal  Eyes: PERRLA  Pulmonary: Respirations are non labored, CTAB, no rales,  rhonchi, or wheezing.  Cardiac: regular rhythm, no detected murmur. Pacemaker subcutaneous at left upper chest.  VASCULAR EXAM  Carotid Bruits  Left  Right    positive Negative    Radial pulses are 2+ palpable and equal.  Bilateral DP pulses are 2+ palpable.  Gastrointestinal: soft, nontender, BS WNL, no r/g, no palpable masses.  Musculoskeletal: No muscle atrophy/wasting. M/S 5/5 throughout, Extremities without ischemic changes.  Neurologic: A&O X 3; Appropriate Affect, Speech is normal  CN 2-12 intact, Pain and light touch intact in extremities, Motor exam as listed above.    Non-Invasive Vascular Imaging CAROTID DUPLEX 01/19/2016   Right ICA: 1 - 39 % stenosis. Left ICA: 60 - 79 % stenosis. Bilateral vertebral artery is antegrade. Bilateral subclavian arteries are multiphasic. No significant change in comparison to the last exam on 06/25/15.    Assessment: Max Cole is a 82 y.o. male who has stable bilateral carotid artery stenosis since at least 2012. He has no history of stroke or TIA. Today's carotid duplex suggests <40% right ICA stenosis and 60-79% left ICA stenosis. No significant change in comparison to the last exam on 06/25/15 and since at least 2012 or before.    He exercises most days of the week, seems fit and considerably younger than his given age.  Pt states that since there has been no progression of his carotid artery stenosis since at least 2012, that he would rather have the carotid duplex in 6 months without waiting to see me afterward, and send a letter to him with the results if stable.    Plan: Follow-up in 6 months with Carotid Duplex scan, no need to see me afterward, will send letter to pt with results if stable; I will call him if there has been progression of the stenosis.   I discussed in depth with the patient the nature of  atherosclerosis, and emphasized the importance of maximal medical management including strict control of blood pressure, blood glucose, and lipid levels, obtaining regular exercise, and continued cessation of smoking.  The patient is aware that without maximal medical management the underlying atherosclerotic disease process will progress, limiting the benefit of any interventions. The patient was given information about stroke prevention and what symptoms should prompt the patient to seek immediate medical care. Thank you for allowing [place] to participate in this patient's care.  Clemon Chambers, RN, MSN, FNP-C [facility] Office: [phone]  Clinic Physician: Early  01/19/16 3:34 PM

## 2016-01-22 ENCOUNTER — Encounter: Payer: Self-pay | Admitting: Cardiology

## 2016-02-08 IMAGING — CR DG CHEST 1V PORT
2 series · 2 of 2 positions shown · non-contrast
Comparison: 01/24/2006 chest radiograph.

CLINICAL DATA: Pacemaker placement

EXAM:
PORTABLE CHEST 1 VIEW

[AP (1 of 2)]
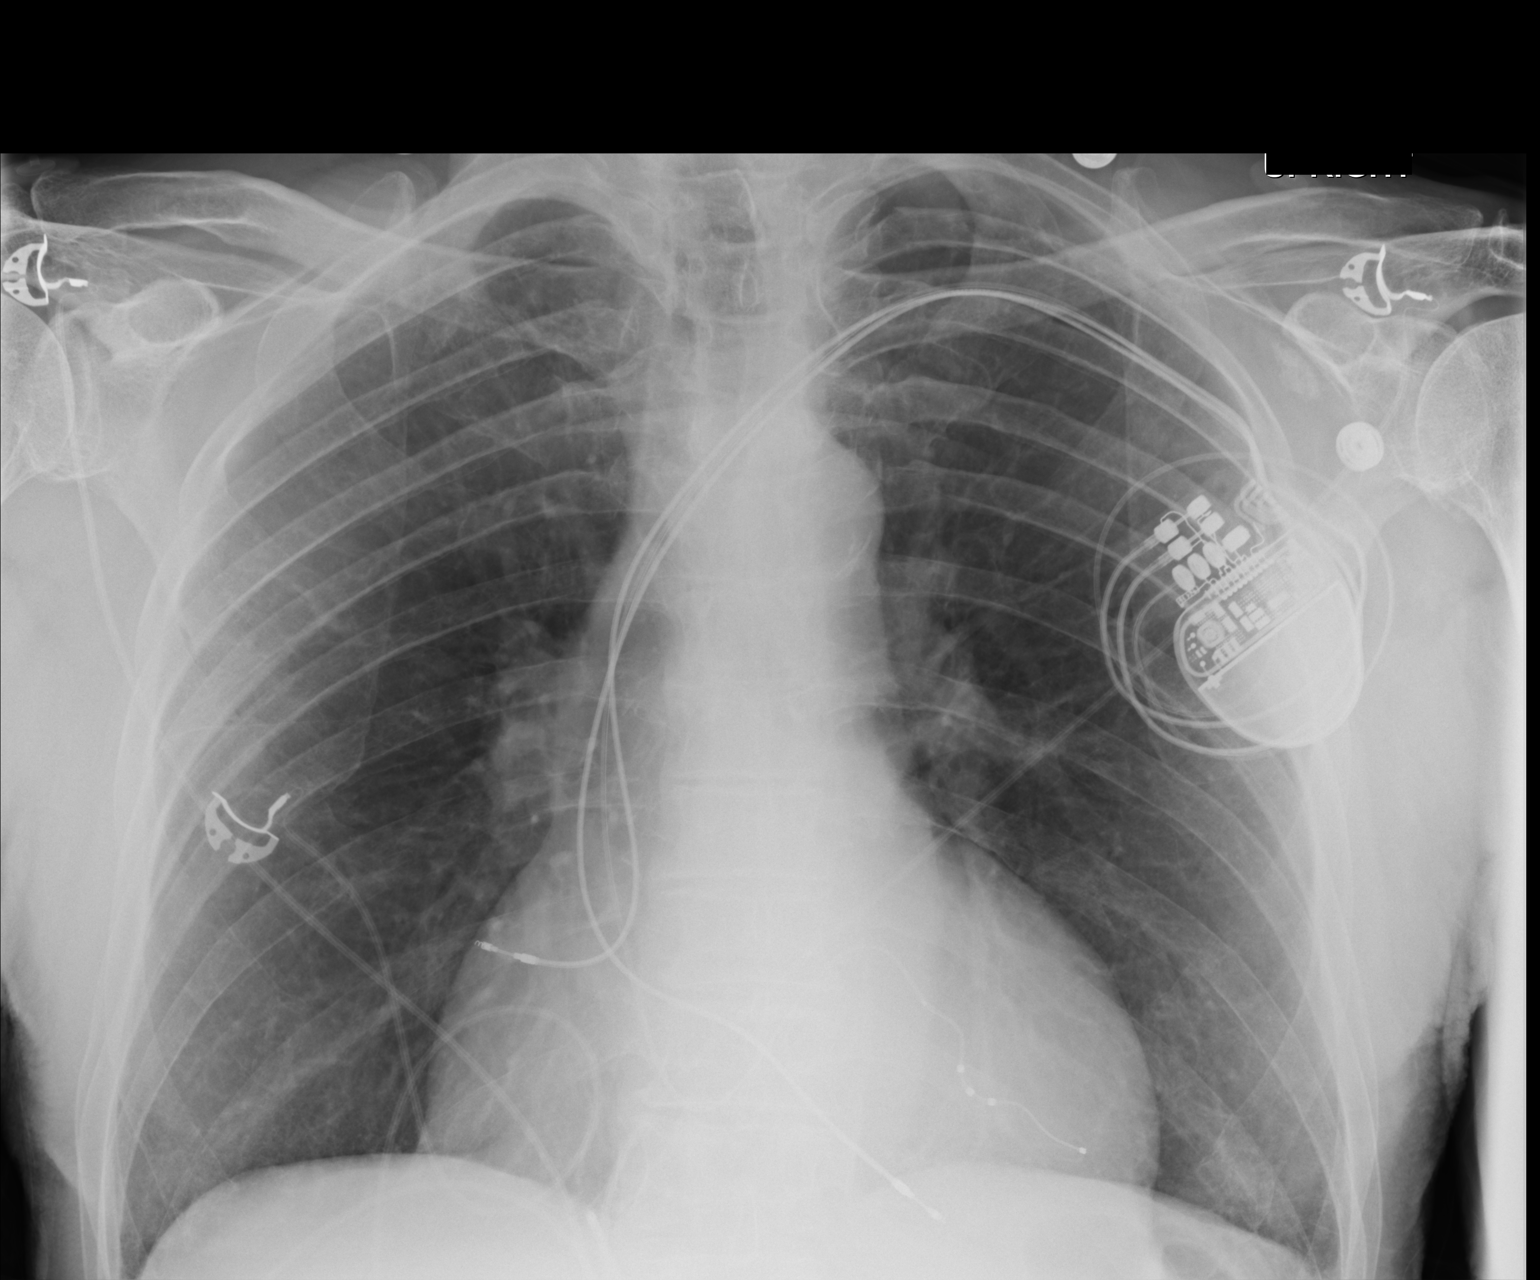

[AP (2 of 2)]
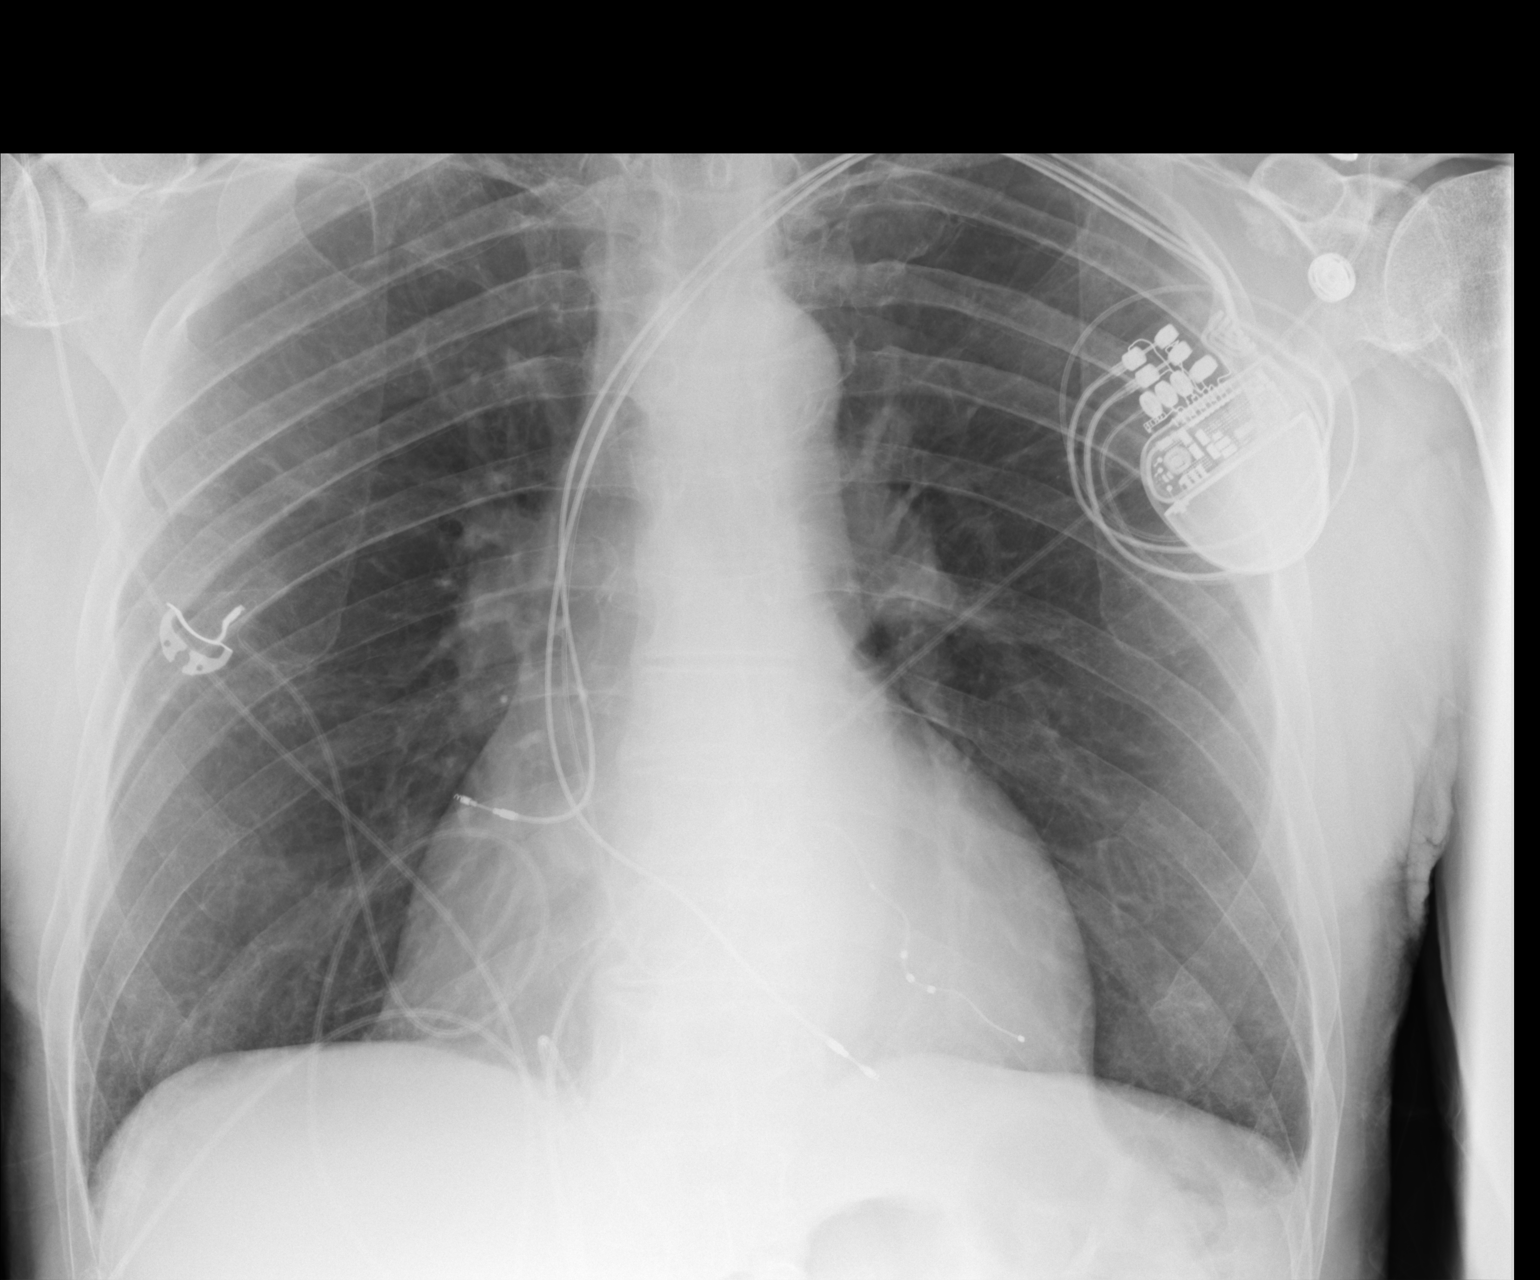

[2 of 2 positions shown; findings below may reference images not displayed]

FINDINGS: Three lead left subclavian pacemaker is noted with lead tips
overlying the right atrium, right ventricle and coronary sinus.
Stable cardiomediastinal silhouette with top-normal heart size. No
pneumothorax. No pleural effusion. Clear lungs, with no focal lung
consolidation and no pulmonary edema.
IMPRESSION: Three lead left subclavian pacemaker appears well positioned on
these frontal views. No pneumothorax. No active cardiopulmonary
disease.

## 2016-03-22 DIAGNOSIS — Z23 Encounter for immunization: Secondary | ICD-10-CM | POA: Diagnosis not present

## 2016-04-06 ENCOUNTER — Telehealth: Payer: Self-pay | Admitting: Cardiology

## 2016-04-06 ENCOUNTER — Ambulatory Visit (INDEPENDENT_AMBULATORY_CARE_PROVIDER_SITE_OTHER): Payer: Medicare Other | Admitting: *Deleted

## 2016-04-06 DIAGNOSIS — I428 Other cardiomyopathies: Secondary | ICD-10-CM

## 2016-04-06 NOTE — Progress Notes (Signed)
Remote pacemaker transmission.   

## 2016-04-06 NOTE — Telephone Encounter (Signed)
Spoke with pt and reminded pt of remote transmission that is due today. Pt verbalized understanding.   

## 2016-04-07 ENCOUNTER — Encounter: Payer: Self-pay | Admitting: Cardiology

## 2016-04-15 ENCOUNTER — Other Ambulatory Visit: Payer: Self-pay | Admitting: Physician Assistant

## 2016-04-15 NOTE — Telephone Encounter (Signed)
Review for refill. 

## 2016-04-21 ENCOUNTER — Encounter: Payer: Self-pay | Admitting: Cardiology

## 2016-05-06 LAB — CUP PACEART REMOTE DEVICE CHECK
Battery Remaining Percentage: 100 %
Brady Statistic RA Percent Paced: 34 %
Brady Statistic RV Percent Paced: 98 %
Implantable Lead Implant Date: 20170106
Implantable Lead Location: 753859
Implantable Lead Location: 753860
Implantable Lead Model: 4677
Implantable Lead Model: 7741
Implantable Lead Model: 7742
Implantable Lead Serial Number: 724660
Lead Channel Impedance Value: 579 Ohm
Lead Channel Impedance Value: 636 Ohm
Lead Channel Impedance Value: 749 Ohm
Lead Channel Pacing Threshold Amplitude: 1.6 V
Lead Channel Pacing Threshold Pulse Width: 0.4 ms
Lead Channel Setting Pacing Amplitude: 2 V
Lead Channel Setting Pacing Amplitude: 2.6 V
Lead Channel Setting Pacing Pulse Width: 0.4 ms
Lead Channel Setting Sensing Sensitivity: 2.5 mV
Lead Channel Setting Sensing Sensitivity: 2.5 mV
MDC IDC LEAD IMPLANT DT: 20170106
MDC IDC LEAD IMPLANT DT: 20170106
MDC IDC LEAD LOCATION: 753858
MDC IDC LEAD SERIAL: 507382
MDC IDC LEAD SERIAL: 695539
MDC IDC MSMT BATTERY REMAINING LONGEVITY: 132 mo
MDC IDC MSMT LEADCHNL LV PACING THRESHOLD PULSEWIDTH: 0.4 ms
MDC IDC MSMT LEADCHNL RV PACING THRESHOLD AMPLITUDE: 0.8 V
MDC IDC PG IMPLANT DT: 20170106
MDC IDC PG SERIAL: 714587
MDC IDC SESS DTM: 20171011170000
MDC IDC SET LEADCHNL LV PACING PULSEWIDTH: 0.4 ms
MDC IDC SET LEADCHNL RA PACING AMPLITUDE: 2.5 V

## 2016-05-12 ENCOUNTER — Other Ambulatory Visit: Payer: Self-pay | Admitting: *Deleted

## 2016-05-12 DIAGNOSIS — I6523 Occlusion and stenosis of bilateral carotid arteries: Secondary | ICD-10-CM

## 2016-05-31 DIAGNOSIS — Z79899 Other long term (current) drug therapy: Secondary | ICD-10-CM | POA: Diagnosis not present

## 2016-05-31 DIAGNOSIS — N189 Chronic kidney disease, unspecified: Secondary | ICD-10-CM | POA: Diagnosis not present

## 2016-05-31 DIAGNOSIS — J449 Chronic obstructive pulmonary disease, unspecified: Secondary | ICD-10-CM | POA: Diagnosis not present

## 2016-05-31 DIAGNOSIS — I779 Disorder of arteries and arterioles, unspecified: Secondary | ICD-10-CM | POA: Diagnosis not present

## 2016-06-14 DIAGNOSIS — J449 Chronic obstructive pulmonary disease, unspecified: Secondary | ICD-10-CM | POA: Diagnosis not present

## 2016-06-14 DIAGNOSIS — Z0001 Encounter for general adult medical examination with abnormal findings: Secondary | ICD-10-CM | POA: Diagnosis not present

## 2016-06-14 DIAGNOSIS — C61 Malignant neoplasm of prostate: Secondary | ICD-10-CM | POA: Diagnosis not present

## 2016-06-14 DIAGNOSIS — N183 Chronic kidney disease, stage 3 (moderate): Secondary | ICD-10-CM | POA: Diagnosis not present

## 2016-06-14 DIAGNOSIS — Z6824 Body mass index (BMI) 24.0-24.9, adult: Secondary | ICD-10-CM | POA: Diagnosis not present

## 2016-06-14 DIAGNOSIS — R350 Frequency of micturition: Secondary | ICD-10-CM | POA: Diagnosis not present

## 2016-07-22 ENCOUNTER — Ambulatory Visit (HOSPITAL_COMMUNITY)
Admission: RE | Admit: 2016-07-22 | Discharge: 2016-07-22 | Disposition: A | Discharge status: Home / Self Care | Payer: Medicare Other | Source: Ambulatory Visit | Attending: Family | Admitting: Family

## 2016-07-22 DIAGNOSIS — I6523 Occlusion and stenosis of bilateral carotid arteries: Secondary | ICD-10-CM

## 2016-07-28 ENCOUNTER — Encounter: Payer: Self-pay | Admitting: Internal Medicine

## 2016-07-28 ENCOUNTER — Ambulatory Visit (INDEPENDENT_AMBULATORY_CARE_PROVIDER_SITE_OTHER): Payer: Medicare Other | Admitting: Internal Medicine

## 2016-07-28 DIAGNOSIS — I428 Other cardiomyopathies: Secondary | ICD-10-CM

## 2016-07-28 DIAGNOSIS — I5022 Chronic systolic (congestive) heart failure: Secondary | ICD-10-CM

## 2016-07-28 DIAGNOSIS — R001 Bradycardia, unspecified: Secondary | ICD-10-CM | POA: Diagnosis not present

## 2016-07-28 LAB — CUP PACEART INCLINIC DEVICE CHECK
Date Time Interrogation Session: 20180201050000
Implantable Lead Implant Date: 20170106
Implantable Lead Location: 753859
Implantable Lead Location: 753860
Implantable Lead Model: 7742
Implantable Lead Serial Number: 507382
Implantable Lead Serial Number: 695539
Implantable Pulse Generator Implant Date: 20170106
Lead Channel Pacing Threshold Amplitude: 0.6 V
Lead Channel Pacing Threshold Amplitude: 0.8 V
Lead Channel Pacing Threshold Amplitude: 1.2 V
Lead Channel Pacing Threshold Pulse Width: 0.4 ms
Lead Channel Sensing Intrinsic Amplitude: 19.4 mV
Lead Channel Sensing Intrinsic Amplitude: 3.3 mV
Lead Channel Setting Pacing Amplitude: 2 V
Lead Channel Setting Pacing Pulse Width: 0.4 ms
Lead Channel Setting Sensing Sensitivity: 2.5 mV
MDC IDC LEAD IMPLANT DT: 20170106
MDC IDC LEAD IMPLANT DT: 20170106
MDC IDC LEAD LOCATION: 753858
MDC IDC LEAD SERIAL: 724660
MDC IDC MSMT LEADCHNL LV IMPEDANCE VALUE: 647 Ohm
MDC IDC MSMT LEADCHNL LV SENSING INTR AMPL: 19.1 mV
MDC IDC MSMT LEADCHNL RA IMPEDANCE VALUE: 654 Ohm
MDC IDC MSMT LEADCHNL RA PACING THRESHOLD PULSEWIDTH: 0.4 ms
MDC IDC MSMT LEADCHNL RV IMPEDANCE VALUE: 719 Ohm
MDC IDC MSMT LEADCHNL RV PACING THRESHOLD PULSEWIDTH: 0.4 ms
MDC IDC SET LEADCHNL LV PACING AMPLITUDE: 2.2 V
MDC IDC SET LEADCHNL LV PACING PULSEWIDTH: 0.4 ms
MDC IDC SET LEADCHNL RA PACING AMPLITUDE: 2 V
MDC IDC SET LEADCHNL RV SENSING SENSITIVITY: 2.5 mV
Pulse Gen Serial Number: 714587

## 2016-07-28 NOTE — Progress Notes (Signed)
HPI Mr. Handley returns today for evaluation of syncope, s/p PPM insertion. He was noted to be bradycardic and hypotensive. He has remained active and is exercising regularly. He c/o getting dizzy when he does lunges. He has had a difficult time take ACE inhibitor and beta blockers because of dizziness. No syncope. No edema. No chest pain.  No Known Allergies  Current Outpatient Prescriptions  Medication Sig Dispense Refill  . aspirin 81 MG tablet Take 1 tablet (81 mg total) by mouth daily. 30 tablet   . carvedilol (COREG) 3.125 MG tablet TAKE (1) TABLET BY MOUTH TWICE DAILY WITH A MEAL. (Patient taking differently: TAKE (1) TABLET BY MOUTH  DAILY WITH A MEAL.) 60 tablet 6  . pravastatin (PRAVACHOL) 10 MG tablet Take 10 mg by mouth daily.    . ramipril (ALTACE) 2.5 MG tablet Take 1.25 mg by mouth daily. Take 1/2 tab daily     No current facility-administered medications for this visit.      Past Medical History:  Diagnosis Date  . Atrial fibrillation ([place])   . Bradycardia   . Cardiomyopathy 2005   a. Possibly alcoholic; b. cath in 0000000 ostial D1, PCW of 12, EF of 35-40%;  c. EF of 0.25 in 11/2003;  d. 40-45% in 05/2004;  e. 25% in 10/2008 by echo;  f. 04/2015 Echo: EF 40-45%, diff HK, sev inflat/inf HK, Gr 1 DD, mild AI, triv TR.  . Carotid artery occlusion   . Cerebrovascular disease    Carotid ultrasound in 12/2010-60-79% left internal carotid artery, less than 40% or right RICA, no change  . Chronic kidney disease    Creatinine of 1.6 in 2009  . Hyperlipidemia 10/15/2011   Lipid profile in 03/2009:231, 136, 56, 148  . Left bundle branch block   . Prostate carcinoma ([place])   . Syncope    [place] CRT-P 07/03/15 Dr. Lovena Le    ROS:   All systems reviewed and negative except as noted in the HPI.   Past Surgical History:  Procedure Laterality Date  . CATARACT EXTRACTION     Right  . COLONOSCOPY N/A 08/16/2012   Procedure: COLONOSCOPY;  Surgeon: Rogene Houston, MD;   [facility];  Service: Endoscopy;  Laterality: N/A;  930  . COLONOSCOPY N/A 10/21/2015   Procedure: COLONOSCOPY;  Surgeon: Rogene Houston, MD;  [facility];  Service: Endoscopy;  Laterality: N/A;  1200  . COLONOSCOPY W/ POLYPECTOMY  2010   Diverticulosis  . EP IMPLANTABLE DEVICE N/A 07/03/2015   Procedure: BiV Pacemaker Insertion CRT-P;  Surgeon: Evans Lance, MD;  Location: [place] [facility];  Service: Cardiovascular;  Laterality: N/A;  . EYE SURGERY Right    Cataract  . HERNIA REPAIR       Family History  Problem Relation Age of Onset  . Hypertension Mother      Social History   Social History  . Marital status: Married    Spouse name: N/A  . Number of children: N/A  . Years of education: N/A   Occupational History  . Not on file.   Social History Main Topics  . Smoking status: Former Smoker    Packs/day: 1.00    Years: 30.00    Quit date: 09/28/1973  . Smokeless tobacco: Never Used  . Alcohol use 3.0 oz/week    5 Glasses of wine per week     Comment: History of excessive alcohol use; Alternates between glass of wine vs.  Liquor drink 5 days per week (04/2015)  . Drug use: No  . Sexual activity: Not on file   Other Topics Concern  . Not on file   Social History Narrative  . No narrative on file     BP 136/68   Pulse 66   Ht 6\' 1"  (1.854 m)   Wt 180 lb (81.6 kg)   BMI 23.75 kg/m   Physical Exam:  Well appearing 83 yo man, looks younger than his stated age, NAD HEENT: Unremarkable Neck:  6 cm JVD, no thyromegally Back:  No CVA tenderness Lungs:  Clear with no wheezes HEART:  Regular rate rhythm, no murmurs, no rubs, no clicks, split S2.  Abd:  soft, positive bowel sounds, no organomegally, no rebound, no guarding Ext:  2 plus pulses, no edema, no cyanosis, no clubbing Skin:  No rashes no nodules Neuro:  CN II through XII intact, motor grossly intact  Device interogation - his [employer] biv PPM is working  normally.  Assess/Plan: 1. Chronic systolic heart failure - his symptoms appear to be class 1. He will continue his current meds.  2. Dizziness - we discussed the mechanism of his dizziness. I have asked him to not exercise as strenuously. 3. HTN - his blood pressure today is well controlled. No change in meds. 4. PPM - his [place] Sci BiV PPM is working normally. Will recheck in several months.  Cristopher Peru, M.D.

## 2016-07-28 NOTE — Patient Instructions (Signed)
Your physician wants you to follow-up in: 1 Year with Dr. Lovena Le. You will receive a reminder letter in the mail two months in advance. If you don't receive a letter, please call our office to schedule the follow-up appointment.  Remote monitoring is used to monitor your Pacemaker of ICD from home. This monitoring reduces the number of office visits required to check your device to one time per year. It allows [place] to keep an eye on the functioning of your device to ensure it is working properly. You are scheduled for a device check from home on 10/27/16. You may send your transmission at any time that day. If you have a wireless device, the transmission will be sent automatically. After your physician reviews your transmission, you will receive a postcard with your next transmission date.  Your physician recommends that you continue on your current medications as directed. Please refer to the Current Medication list given to you today.  If you need a refill on your cardiac medications before your next appointment, please call your pharmacy.  Thank you for choosing [facility]!

## 2016-08-15 ENCOUNTER — Telehealth: Payer: Self-pay | Admitting: Family

## 2016-08-15 NOTE — Telephone Encounter (Signed)
I called the patient to relay the information from [place], NP and also scheduled a 6 mo fu appt w/ him for 01/31/17 at 2pm for carotid US and to see NP at 2:45pm. I also asked him to call us sooner if he had any issues arise prior. He voiced understanding. awt

## 2016-08-15 NOTE — Telephone Encounter (Signed)
-----   Message from Viann Fish, NP sent at 08/15/2016  2:36 PM EST ----- Regarding: RE: Results of Carotid US Contact: [phone] Selina Cooley letter, no change from 01-18-17 carotid duplex. He needs to follow up 6 months after his last carotid duplex with a repeat carotid duplex; this would be due in July this year. If he experiences any stroke symptoms he needs to call 911. Please send him an AVS for Stroke Prevention. I should probably evaluate him at the next visit.  Thanks, Vinnie Level   ----- Message ----- From: Lujean Amel Sent: 08/15/2016   1:56 PM To: Sharmon Leyden Nickel, NP Subject: Results of Carotid US                          Hi Vinnie Level, You last saw this patient in the office on 01/19/16. He called today and stated he had a carotid US on 07/22/16. The appointment notes states that you would review the [place] and if stable, would mail him the results. Can we mail the results to him or would you prefer to call him? Please let me know.  Thanks, Anne Ng

## 2016-09-22 DIAGNOSIS — C44702 Unspecified malignant neoplasm of skin of right lower limb, including hip: Secondary | ICD-10-CM | POA: Diagnosis not present

## 2016-09-22 DIAGNOSIS — L309 Dermatitis, unspecified: Secondary | ICD-10-CM | POA: Diagnosis not present

## 2016-09-22 DIAGNOSIS — L821 Other seborrheic keratosis: Secondary | ICD-10-CM | POA: Diagnosis not present

## 2016-09-22 DIAGNOSIS — C44712 Basal cell carcinoma of skin of right lower limb, including hip: Secondary | ICD-10-CM | POA: Diagnosis not present

## 2016-09-22 DIAGNOSIS — L570 Actinic keratosis: Secondary | ICD-10-CM | POA: Diagnosis not present

## 2016-09-22 DIAGNOSIS — C44709 Unspecified malignant neoplasm of skin of left lower limb, including hip: Secondary | ICD-10-CM | POA: Diagnosis not present

## 2016-09-22 DIAGNOSIS — D1801 Hemangioma of skin and subcutaneous tissue: Secondary | ICD-10-CM | POA: Diagnosis not present

## 2016-09-22 DIAGNOSIS — D485 Neoplasm of uncertain behavior of skin: Secondary | ICD-10-CM | POA: Diagnosis not present

## 2016-09-22 DIAGNOSIS — L814 Other melanin hyperpigmentation: Secondary | ICD-10-CM | POA: Diagnosis not present

## 2016-10-17 DIAGNOSIS — L905 Scar conditions and fibrosis of skin: Secondary | ICD-10-CM | POA: Diagnosis not present

## 2016-10-17 DIAGNOSIS — C44729 Squamous cell carcinoma of skin of left lower limb, including hip: Secondary | ICD-10-CM | POA: Diagnosis not present

## 2016-10-27 ENCOUNTER — Ambulatory Visit (INDEPENDENT_AMBULATORY_CARE_PROVIDER_SITE_OTHER): Payer: Medicare Other | Admitting: *Deleted

## 2016-10-27 DIAGNOSIS — R001 Bradycardia, unspecified: Secondary | ICD-10-CM | POA: Diagnosis not present

## 2016-10-27 NOTE — Progress Notes (Signed)
Remote pacemaker transmission.   

## 2016-10-28 LAB — CUP PACEART REMOTE DEVICE CHECK
Battery Remaining Longevity: 132 mo
Battery Remaining Percentage: 100 %
Brady Statistic RV Percent Paced: 98 %
Date Time Interrogation Session: 20180503162600
Implantable Lead Implant Date: 20170106
Implantable Lead Implant Date: 20170106
Implantable Lead Model: 7742
Implantable Lead Serial Number: 507382
Implantable Lead Serial Number: 724660
Lead Channel Impedance Value: 598 Ohm
Lead Channel Pacing Threshold Amplitude: 0.7 V
Lead Channel Pacing Threshold Amplitude: 1.2 V
Lead Channel Pacing Threshold Pulse Width: 0.4 ms
Lead Channel Setting Pacing Amplitude: 2 V
Lead Channel Setting Pacing Amplitude: 2.2 V
Lead Channel Setting Pacing Pulse Width: 0.4 ms
Lead Channel Setting Sensing Sensitivity: 2.5 mV
Lead Channel Setting Sensing Sensitivity: 2.5 mV
MDC IDC LEAD IMPLANT DT: 20170106
MDC IDC LEAD LOCATION: 753858
MDC IDC LEAD LOCATION: 753859
MDC IDC LEAD LOCATION: 753860
MDC IDC LEAD SERIAL: 695539
MDC IDC MSMT LEADCHNL LV IMPEDANCE VALUE: 657 Ohm
MDC IDC MSMT LEADCHNL RV IMPEDANCE VALUE: 696 Ohm
MDC IDC MSMT LEADCHNL RV PACING THRESHOLD PULSEWIDTH: 0.4 ms
MDC IDC PG IMPLANT DT: 20170106
MDC IDC SET LEADCHNL RV PACING AMPLITUDE: 2 V
MDC IDC SET LEADCHNL RV PACING PULSEWIDTH: 0.4 ms
MDC IDC STAT BRADY RA PERCENT PACED: 33 %
Pulse Gen Serial Number: 714587

## 2016-10-31 DIAGNOSIS — L905 Scar conditions and fibrosis of skin: Secondary | ICD-10-CM | POA: Diagnosis not present

## 2016-10-31 DIAGNOSIS — C44722 Squamous cell carcinoma of skin of right lower limb, including hip: Secondary | ICD-10-CM | POA: Diagnosis not present

## 2016-11-04 ENCOUNTER — Encounter: Payer: Self-pay | Admitting: Cardiology

## 2016-11-09 DIAGNOSIS — C44712 Basal cell carcinoma of skin of right lower limb, including hip: Secondary | ICD-10-CM | POA: Diagnosis not present

## 2016-11-09 DIAGNOSIS — L820 Inflamed seborrheic keratosis: Secondary | ICD-10-CM | POA: Diagnosis not present

## 2016-11-18 ENCOUNTER — Encounter: Payer: Self-pay | Admitting: Cardiology

## 2016-11-29 DIAGNOSIS — H524 Presbyopia: Secondary | ICD-10-CM | POA: Diagnosis not present

## 2016-11-29 DIAGNOSIS — H52223 Regular astigmatism, bilateral: Secondary | ICD-10-CM | POA: Diagnosis not present

## 2016-11-29 DIAGNOSIS — H35451 Secondary pigmentary degeneration, right eye: Secondary | ICD-10-CM | POA: Diagnosis not present

## 2016-11-29 DIAGNOSIS — H5213 Myopia, bilateral: Secondary | ICD-10-CM | POA: Diagnosis not present

## 2016-12-12 DIAGNOSIS — N183 Chronic kidney disease, stage 3 (moderate): Secondary | ICD-10-CM | POA: Diagnosis not present

## 2016-12-12 DIAGNOSIS — Z79899 Other long term (current) drug therapy: Secondary | ICD-10-CM | POA: Diagnosis not present

## 2016-12-12 DIAGNOSIS — Z125 Encounter for screening for malignant neoplasm of prostate: Secondary | ICD-10-CM | POA: Diagnosis not present

## 2016-12-16 DIAGNOSIS — N183 Chronic kidney disease, stage 3 (moderate): Secondary | ICD-10-CM | POA: Diagnosis not present

## 2016-12-16 DIAGNOSIS — C61 Malignant neoplasm of prostate: Secondary | ICD-10-CM | POA: Diagnosis not present

## 2016-12-16 DIAGNOSIS — Z6824 Body mass index (BMI) 24.0-24.9, adult: Secondary | ICD-10-CM | POA: Diagnosis not present

## 2016-12-16 DIAGNOSIS — I5022 Chronic systolic (congestive) heart failure: Secondary | ICD-10-CM | POA: Diagnosis not present

## 2017-01-13 DIAGNOSIS — R42 Dizziness and giddiness: Secondary | ICD-10-CM | POA: Diagnosis not present

## 2017-01-13 DIAGNOSIS — Z6823 Body mass index (BMI) 23.0-23.9, adult: Secondary | ICD-10-CM | POA: Diagnosis not present

## 2017-01-16 ENCOUNTER — Encounter: Payer: Self-pay | Admitting: Family

## 2017-01-19 ENCOUNTER — Encounter (HOSPITAL_COMMUNITY): Payer: Medicare Other

## 2017-01-19 ENCOUNTER — Ambulatory Visit: Payer: Medicare Other | Admitting: Family

## 2017-01-24 ENCOUNTER — Other Ambulatory Visit: Payer: Self-pay

## 2017-01-24 DIAGNOSIS — I6523 Occlusion and stenosis of bilateral carotid arteries: Secondary | ICD-10-CM

## 2017-01-26 ENCOUNTER — Telehealth: Payer: Self-pay | Admitting: Cardiology

## 2017-01-26 ENCOUNTER — Ambulatory Visit (INDEPENDENT_AMBULATORY_CARE_PROVIDER_SITE_OTHER): Payer: Medicare Other | Admitting: *Deleted

## 2017-01-26 DIAGNOSIS — R001 Bradycardia, unspecified: Secondary | ICD-10-CM

## 2017-01-26 NOTE — Telephone Encounter (Signed)
Spoke with pt and reminded pt of remote transmission that is due today. Pt verbalized understanding.   

## 2017-01-26 NOTE — Progress Notes (Signed)
Remote pacemaker transmission.   

## 2017-01-27 ENCOUNTER — Encounter: Payer: Self-pay | Admitting: Cardiology

## 2017-01-31 ENCOUNTER — Encounter: Payer: Self-pay | Admitting: Family

## 2017-01-31 ENCOUNTER — Ambulatory Visit (INDEPENDENT_AMBULATORY_CARE_PROVIDER_SITE_OTHER): Payer: Medicare Other | Admitting: Family

## 2017-01-31 ENCOUNTER — Ambulatory Visit (HOSPITAL_COMMUNITY)
Admission: RE | Admit: 2017-01-31 | Discharge: 2017-01-31 | Disposition: A | Discharge status: Home / Self Care | Payer: Medicare Other | Source: Ambulatory Visit | Attending: Vascular Surgery | Admitting: Vascular Surgery

## 2017-01-31 VITALS — BP 119/58 | HR 67 | Temp 97.3°F | Resp 18 | Ht 73.0 in | Wt 180.0 lb

## 2017-01-31 DIAGNOSIS — I6523 Occlusion and stenosis of bilateral carotid arteries: Secondary | ICD-10-CM

## 2017-01-31 DIAGNOSIS — Z87891 Personal history of nicotine dependence: Secondary | ICD-10-CM | POA: Diagnosis not present

## 2017-01-31 LAB — VAS US CAROTID
LCCADDIAS: 19 cm/s
LEFT ECA DIAS: -13 cm/s
LICAPDIAS: -57 cm/s
Left CCA dist sys: 83 cm/s
Left CCA prox dias: 19 cm/s
Left CCA prox sys: 149 cm/s
Left ICA dist dias: -19 cm/s
Left ICA dist sys: -51 cm/s
Left ICA prox sys: -242 cm/s
RCCADSYS: -39 cm/s
RCCAPDIAS: 19 cm/s
RCCAPSYS: 117 cm/s
RIGHT CCA MID DIAS: 17 cm/s
RIGHT ECA DIAS: -11 cm/s

## 2017-01-31 NOTE — Progress Notes (Signed)
Chief Complaint: Follow up Extracranial Carotid Artery Stenosis   History of Present Illness  Max Cole is a 83 y.o. male whom Dr. Donnetta Hutching has been monitoring for extracranial carotid artery stenosis. He returns today for follow up.  He has not had previous carotid artery intervention.   He walks 2 miles, 5 days/week, he works out in addition to this. He has a large yard and does his own yard work.   The patient has no history of TIA or stroke symptom. Specifically denies a history of amaurosis fugax or monocular blindness, unilateral facial drooping, hemiplegia, or receptive or expressive aphasia.  Patient denies claudication symptoms with walking, denies non-healing wounds.   He had a syncopal episode in November 2016, states his heart rate decreased to 35/min at times; he knew he needed a pacemaker; he had a pacemaker insertion 07/03/15 by Dr. Lovena Le.   Pt Diabetic: No Pt smoker: smoked off and on for 20+ years, quit in the 1970's  Pt meds include:  Statin : Yes, takes 1/4 tab of 10 mg pravastatin daily   Betablocker: no ASA: Yes  Other anticoagulants/antiplatelets: no     Past Medical History:  Diagnosis Date  . Atrial fibrillation ([place])   . Bradycardia   . Cardiomyopathy 2005   a. Possibly alcoholic; b. cath in 8921-19% ostial D1, PCW of 12, EF of 35-40%;  c. EF of 0.25 in 11/2003;  d. 40-45% in 05/2004;  e. 25% in 10/2008 by echo;  f. 04/2015 Echo: EF 40-45%, diff HK, sev inflat/inf HK, Gr 1 DD, mild AI, triv TR.  . Carotid artery occlusion   . Cerebrovascular disease    Carotid ultrasound in 12/2010-60-79% left internal carotid artery, less than 40% or right RICA, no change  . Chronic kidney disease    Creatinine of 1.6 in 2009  . Hyperlipidemia 10/15/2011   Lipid profile in 03/2009:231, 136, 56, 148  . Left bundle branch block   . Prostate carcinoma ([place])   . Syncope    [place] CRT-P 07/03/15 Dr. Lovena Le    Social History Social History   Substance Use Topics  . Smoking status: Former Smoker    Packs/day: 1.00    Years: 30.00    Quit date: 09/28/1973  . Smokeless tobacco: Never Used  . Alcohol use 3.0 oz/week    5 Glasses of wine per week     Comment: History of excessive alcohol use; Alternates between glass of wine vs. Liquor drink 5 days per week (04/2015)    Family History Family History  Problem Relation Age of Onset  . Hypertension Mother     Surgical History Past Surgical History:  Procedure Laterality Date  . CATARACT EXTRACTION     Right  . COLONOSCOPY N/A 08/16/2012   Procedure: COLONOSCOPY;  Surgeon: Rogene Houston, MD;  [facility];  Service: Endoscopy;  Laterality: N/A;  930  . COLONOSCOPY N/A 10/21/2015   Procedure: COLONOSCOPY;  Surgeon: Rogene Houston, MD;  [facility];  Service: Endoscopy;  Laterality: N/A;  1200  . COLONOSCOPY W/ POLYPECTOMY  2010   Diverticulosis  . EP IMPLANTABLE DEVICE N/A 07/03/2015   Procedure: BiV Pacemaker Insertion CRT-P;  Surgeon: Evans Lance, MD;  Location: [facility];  Service: Cardiovascular;  Laterality: N/A;  . EYE SURGERY Right    Cataract  . HERNIA REPAIR      No Known Allergies  Current Outpatient Prescriptions  Medication Sig Dispense Refill  . aspirin  81 MG tablet Take 1 tablet (81 mg total) by mouth daily. 30 tablet   . carvedilol (COREG) 3.125 MG tablet TAKE (1) TABLET BY MOUTH TWICE DAILY WITH A MEAL. (Patient taking differently: TAKE (1) TABLET BY MOUTH  DAILY WITH A MEAL.) 60 tablet 6  . pravastatin (PRAVACHOL) 10 MG tablet Take 10 mg by mouth daily.    . ramipril (ALTACE) 2.5 MG tablet Take 1.25 mg by mouth daily. Take 1/2 tab daily     No current facility-administered medications for this visit.     Review of Systems : See HPI for pertinent positives and negatives.  Physical Examination  Vitals:   01/31/17 1454 01/31/17 1456  BP: 135/65 (!) 119/58  Pulse: 67   Resp: 18   Temp: (!) 97.3 F (36.3 C)    TempSrc: Oral   SpO2: 96%   Weight: 180 lb (81.6 kg)   Height: 6\' 1"  (1.854 m)    Body mass index is 23.75 kg/m.  General: WDWN male in NAD, fit appearing elderly male. GAIT:normal  Eyes: PERRLA  Pulmonary: Respirations are non labored, CTAB, no rales, rhonchi, or wheezing.  Cardiac: regular rhythm and rate, no detected murmur. Pacemaker subcutaneous at left upper chest.  VASCULAR EXAM Carotid Bruits Left Right   positive Negative    Radial pulses are 2+ palpable and equal.  Bilateral DP pulses are 2+ palpable.  Gastrointestinal: soft, nontender, BS WNL, no r/g, no palpable masses.  Musculoskeletal: No muscle atrophy/wasting. M/S 5/5 throughout, Extremities without ischemic changes.  Neurologic: A&O X 3; appropriate affect, speech is normal, CN 2-12 intact, Pain and light touch intact in extremities, Motor exam as listed above.     Assessment: Max Cole is a 83 y.o. male who has stable bilateral carotid artery stenosis since at least 2012. He has no history of stroke or TIA.  He does not have DM and has not smoked since the 1970's.  He exercises most days of the week, seems fit and considerably younger than his given age.  DATA Carotid Duplex (01/31/17): <40% right ICA stenosis. 60-79% left ICA stenosis.  Bilateral vertebral artery flow is antegrade.  Bilateral subclavian artery waveforms are normal.  No significant change in comparison to the last exam on 07-21-16, and since at least 2012 or before.    Plan:  Follow-up in 9 months with Carotid Duplex scan.  I discussed in depth with the patient the nature of atherosclerosis, and emphasized the importance of maximal medical management including strict control of blood pressure, blood glucose, and lipid levels, obtaining regular exercise, and continued cessation of smoking.  The patient is aware that without maximal medical management the underlying atherosclerotic disease process  will progress, limiting the benefit of any interventions. The patient was given information about stroke prevention and what symptoms should prompt the patient to seek immediate medical care. Thank you for allowing [place] to participate in this patient's care.  Clemon Chambers, RN, MSN, FNP-C [facility] Office: [phone]  Clinic Physician: Early  01/31/17 3:01 PM

## 2017-01-31 NOTE — Patient Instructions (Signed)
Stroke Prevention Some medical conditions and behaviors are associated with an increased chance of having a stroke. You may prevent a stroke by making healthy choices and managing medical conditions. How can I reduce my risk of having a stroke?  Stay physically active. Get at least 30 minutes of activity on most or all days.  Do not smoke. It may also be helpful to avoid exposure to secondhand smoke.  Limit alcohol use. Moderate alcohol use is considered to be: ? No more than 2 drinks per day for men. ? No more than 1 drink per day for nonpregnant women.  Eat healthy foods. This involves: ? Eating 5 or more servings of fruits and vegetables a day. ? Making dietary changes that address high blood pressure (hypertension), high cholesterol, diabetes, or obesity.  Manage your cholesterol levels. ? Making food choices that are high in fiber and low in saturated fat, trans fat, and cholesterol may control cholesterol levels. ? Take any prescribed medicines to control cholesterol as directed by your health care provider.  Manage your diabetes. ? Controlling your carbohydrate and sugar intake is recommended to manage diabetes. ? Take any prescribed medicines to control diabetes as directed by your health care provider.  Control your hypertension. ? Making food choices that are low in salt (sodium), saturated fat, trans fat, and cholesterol is recommended to manage hypertension. ? Ask your health care provider if you need treatment to lower your blood pressure. Take any prescribed medicines to control hypertension as directed by your health care provider. ? If you are 18-39 years of age, have your blood pressure checked every 3-5 years. If you are 40 years of age or older, have your blood pressure checked every year.  Maintain a healthy weight. ? Reducing calorie intake and making food choices that are low in sodium, saturated fat, trans fat, and cholesterol are recommended to manage  weight.  Stop drug abuse.  Avoid taking birth control pills. ? Talk to your health care provider about the risks of taking birth control pills if you are over 35 years old, smoke, get migraines, or have ever had a blood clot.  Get evaluated for sleep disorders (sleep apnea). ? Talk to your health care provider about getting a sleep evaluation if you snore a lot or have excessive sleepiness.  Take medicines only as directed by your health care provider. ? For some people, aspirin or blood thinners (anticoagulants) are helpful in reducing the risk of forming abnormal blood clots that can lead to stroke. If you have the irregular heart rhythm of atrial fibrillation, you should be on a blood thinner unless there is a good reason you cannot take them. ? Understand all your medicine instructions.  Make sure that other conditions (such as anemia or atherosclerosis) are addressed. Get help right away if:  You have sudden weakness or numbness of the face, arm, or leg, especially on one side of the body.  Your face or eyelid droops to one side.  You have sudden confusion.  You have trouble speaking (aphasia) or understanding.  You have sudden trouble seeing in one or both eyes.  You have sudden trouble walking.  You have dizziness.  You have a loss of balance or coordination.  You have a sudden, severe headache with no known cause.  You have new chest pain or an irregular heartbeat. Any of these symptoms may represent a serious problem that is an emergency. Do not wait to see if the symptoms will go away.   Get medical help at once. Call your local emergency services (911 in [place] Do not drive yourself to the hospital. This information is not intended to replace advice given to you by your health care provider. Make sure you discuss any questions you have with your health care provider. Document Released: 07/21/2004 Document Revised: 11/19/2015 Document Reviewed: 12/14/2012 Elsevier  Interactive Patient Education  2017 Elsevier Inc.     Preventing Cerebrovascular Disease Arteries are blood vessels that carry blood that contains oxygen from the heart to all parts of the body. Cerebrovascular disease affects arteries that supply the brain. Any condition that blocks or disrupts blood flow to the brain can cause cerebrovascular disease. Brain cells that lose blood supply start to die within minutes (stroke). Stroke is the main danger of cerebrovascular disease. Atherosclerosis and high blood pressure are common causes of cerebrovascular disease. Atherosclerosis is narrowing and hardening of an artery that results when fat, cholesterol, calcium, or other substances (plaque) build up inside an artery. Plaque reduces blood flow through the artery. High blood pressure increases the risk of bleeding inside the brain. Making diet and lifestyle changes to prevent atherosclerosis and high blood pressure lowers your risk of cerebrovascular disease. What nutrition changes can be made?  Eat more fruits, vegetables, and whole grains.  Reduce how much saturated fat you eat. To do this, eat less red meat and fewer full-fat dairy products.  Eat healthy proteins instead of red meat. Healthy proteins include: ? Fish. Eat fish that contains heart-healthy omega-3 fatty acids, twice a week. Examples include salmon, albacore tuna, mackerel, and herring. ? Chicken. ? Nuts. ? Low-fat or nonfat yogurt.  Avoid processed meats, like bacon and lunchmeat.  Avoid foods that contain: ? A lot of sugar, such as sweets and drinks with added sugar. ? A lot of salt (sodium). Avoid adding extra salt to your food, as told by your health care provider. ? Trans fats, such as margarine and baked goods. Trans fats may be listed as "partially hydrogenated oils" on food labels.  Check food labels to see how much sodium, sugar, and trans fats are in foods.  Use vegetable oils that contain low amounts of  saturated fat, such as olive oil or canola oil. What lifestyle changes can be made?  Drink alcohol in moderation. This means no more than 1 drink a day for nonpregnant women and 2 drinks a day for men. One drink equals 12 oz of beer, 5 oz of wine, or 1 oz of hard liquor.  If you are overweight, ask your health care provider to recommend a weight-loss plan for you. Losing 5-10 lb (2.2-4.5 kg) can reduce your risk of diabetes, atherosclerosis, and high blood pressure.  Exercise for 30?60 minutes on most days, or as much as told by your health care provider. ? Do moderate-intensity exercise, such as brisk walking, bicycling, and water aerobics. Ask your health care provider which activities are safe for you.  Do not use any products that contain nicotine or tobacco, such as cigarettes and e-cigarettes. If you need help quitting, ask your health care provider. Why are these changes important? Making these changes lowers your risk of many diseases that can cause cerebrovascular disease and stroke. Stroke is a leading cause of death and disability. Making these changes also improves your overall health and quality of life. What can I do to lower my risk? The following factors make you more likely to develop cerebrovascular disease:  Being overweight.  Smoking.  Being physically inactive.    Eating a high-fat diet.  Having certain health conditions, such as: ? Diabetes. ? High blood pressure. ? Heart disease. ? Atherosclerosis. ? High cholesterol. ? Sickle cell disease.  Talk with your health care provider about your risk for cerebrovascular disease. Work with your health care provider to control diseases that you have that may contribute to cerebrovascular disease. Your health care provider may prescribe medicines to help prevent major causes of cerebrovascular disease. Where to find more information: Learn more about preventing cerebrovascular disease from:  National Heart, Lung, and  Blood Institute: www.nhlbi.nih.gov/health/health-topics/topics/stroke  Centers for Disease Control and Prevention: cdc.gov/stroke/about.htm  Summary  Cerebrovascular disease can lead to a stroke.  Atherosclerosis and high blood pressure are major causes of cerebrovascular disease.  Making diet and lifestyle changes can reduce your risk of cerebrovascular disease.  Work with your health care provider to get your risk factors under control to reduce your risk of cerebrovascular disease. This information is not intended to replace advice given to you by your health care provider. Make sure you discuss any questions you have with your health care provider. Document Released: 06/28/2015 Document Revised: 01/01/2016 Document Reviewed: 06/28/2015 Elsevier Interactive Patient Education  2018 Elsevier Inc.  

## 2017-02-15 NOTE — Addendum Note (Signed)
Addended by: Lianne Cure A on: 02/15/2017 03:24 PM   Modules accepted: Orders

## 2017-02-20 ENCOUNTER — Encounter: Payer: Self-pay | Admitting: Cardiology

## 2017-03-03 LAB — CUP PACEART REMOTE DEVICE CHECK
Battery Remaining Longevity: 132 mo
Battery Remaining Percentage: 100 %
Brady Statistic RA Percent Paced: 31 %
Brady Statistic RV Percent Paced: 97 %
Implantable Lead Implant Date: 20170106
Implantable Lead Location: 753859
Implantable Lead Model: 7742
Implantable Lead Serial Number: 507382
Implantable Lead Serial Number: 695539
Lead Channel Impedance Value: 697 Ohm
Lead Channel Impedance Value: 710 Ohm
Lead Channel Pacing Threshold Amplitude: 0.8 V
Lead Channel Setting Pacing Amplitude: 2 V
Lead Channel Setting Pacing Pulse Width: 0.4 ms
Lead Channel Setting Sensing Sensitivity: 2.5 mV
MDC IDC LEAD IMPLANT DT: 20170106
MDC IDC LEAD IMPLANT DT: 20170106
MDC IDC LEAD LOCATION: 753858
MDC IDC LEAD LOCATION: 753860
MDC IDC LEAD SERIAL: 724660
MDC IDC MSMT LEADCHNL LV PACING THRESHOLD AMPLITUDE: 1.2 V
MDC IDC MSMT LEADCHNL LV PACING THRESHOLD PULSEWIDTH: 0.4 ms
MDC IDC MSMT LEADCHNL RA IMPEDANCE VALUE: 680 Ohm
MDC IDC MSMT LEADCHNL RV PACING THRESHOLD PULSEWIDTH: 0.4 ms
MDC IDC PG IMPLANT DT: 20170106
MDC IDC PG SERIAL: 714587
MDC IDC SESS DTM: 20180802155900
MDC IDC SET LEADCHNL LV PACING AMPLITUDE: 2.2 V
MDC IDC SET LEADCHNL LV PACING PULSEWIDTH: 0.4 ms
MDC IDC SET LEADCHNL RV PACING AMPLITUDE: 2 V
MDC IDC SET LEADCHNL RV SENSING SENSITIVITY: 2.5 mV

## 2017-03-17 ENCOUNTER — Other Ambulatory Visit: Payer: Self-pay | Admitting: Internal Medicine

## 2017-03-20 ENCOUNTER — Other Ambulatory Visit: Payer: Self-pay

## 2017-03-20 NOTE — Patient Outreach (Signed)
[facility]) Care Management  03/20/2017  Max Cole 1927/09/08 [id number]   Medication Adherence call to [name] the reason for this call is because Mr. Schollmeyer is showing past due under [facility] Ins.on Ramipril 1.25 mg spoke to patient he said he was taking off this medication and now he is taking Carvediol.   [facility] Management Direct Dial [phone]  Fax [phone] [email or web address]

## 2017-04-13 DIAGNOSIS — Z23 Encounter for immunization: Secondary | ICD-10-CM | POA: Diagnosis not present

## 2017-04-27 ENCOUNTER — Ambulatory Visit (INDEPENDENT_AMBULATORY_CARE_PROVIDER_SITE_OTHER): Payer: Medicare Other | Admitting: *Deleted

## 2017-04-27 DIAGNOSIS — I428 Other cardiomyopathies: Secondary | ICD-10-CM | POA: Diagnosis not present

## 2017-04-27 DIAGNOSIS — R001 Bradycardia, unspecified: Secondary | ICD-10-CM

## 2017-04-28 ENCOUNTER — Encounter: Payer: Self-pay | Admitting: Cardiology

## 2017-04-28 NOTE — Progress Notes (Signed)
Remote pacemaker transmission.   

## 2017-05-24 LAB — CUP PACEART REMOTE DEVICE CHECK
Battery Remaining Longevity: 132 mo
Battery Remaining Percentage: 100 %
Brady Statistic RV Percent Paced: 97 %
Implantable Lead Location: 753859
Implantable Lead Location: 753860
Implantable Lead Model: 7741
Implantable Lead Serial Number: 695539
Implantable Lead Serial Number: 724660
Lead Channel Impedance Value: 663 Ohm
Lead Channel Pacing Threshold Amplitude: 1.2 V
Lead Channel Setting Pacing Amplitude: 2 V
Lead Channel Setting Pacing Pulse Width: 0.4 ms
Lead Channel Setting Sensing Sensitivity: 2.5 mV
MDC IDC LEAD IMPLANT DT: 20170106
MDC IDC LEAD IMPLANT DT: 20170106
MDC IDC LEAD IMPLANT DT: 20170106
MDC IDC LEAD LOCATION: 753858
MDC IDC LEAD SERIAL: 507382
MDC IDC MSMT LEADCHNL LV PACING THRESHOLD PULSEWIDTH: 0.4 ms
MDC IDC MSMT LEADCHNL RA IMPEDANCE VALUE: 658 Ohm
MDC IDC MSMT LEADCHNL RV IMPEDANCE VALUE: 717 Ohm
MDC IDC MSMT LEADCHNL RV PACING THRESHOLD AMPLITUDE: 0.7 V
MDC IDC MSMT LEADCHNL RV PACING THRESHOLD PULSEWIDTH: 0.4 ms
MDC IDC PG IMPLANT DT: 20170106
MDC IDC PG SERIAL: 714587
MDC IDC SESS DTM: 20181101113000
MDC IDC SET LEADCHNL LV PACING AMPLITUDE: 2.2 V
MDC IDC SET LEADCHNL LV SENSING SENSITIVITY: 2.5 mV
MDC IDC SET LEADCHNL RA PACING AMPLITUDE: 2 V
MDC IDC SET LEADCHNL RV PACING PULSEWIDTH: 0.4 ms
MDC IDC STAT BRADY RA PERCENT PACED: 30 %

## 2017-06-12 DIAGNOSIS — N183 Chronic kidney disease, stage 3 (moderate): Secondary | ICD-10-CM | POA: Diagnosis not present

## 2017-06-12 DIAGNOSIS — I5022 Chronic systolic (congestive) heart failure: Secondary | ICD-10-CM | POA: Diagnosis not present

## 2017-06-12 DIAGNOSIS — J449 Chronic obstructive pulmonary disease, unspecified: Secondary | ICD-10-CM | POA: Diagnosis not present

## 2017-06-12 DIAGNOSIS — E785 Hyperlipidemia, unspecified: Secondary | ICD-10-CM | POA: Diagnosis not present

## 2017-06-12 DIAGNOSIS — Z79899 Other long term (current) drug therapy: Secondary | ICD-10-CM | POA: Diagnosis not present

## 2017-06-16 DIAGNOSIS — N183 Chronic kidney disease, stage 3 (moderate): Secondary | ICD-10-CM | POA: Diagnosis not present

## 2017-06-16 DIAGNOSIS — I429 Cardiomyopathy, unspecified: Secondary | ICD-10-CM | POA: Diagnosis not present

## 2017-06-16 DIAGNOSIS — Z0001 Encounter for general adult medical examination with abnormal findings: Secondary | ICD-10-CM | POA: Diagnosis not present

## 2017-06-21 ENCOUNTER — Other Ambulatory Visit (HOSPITAL_COMMUNITY): Payer: Self-pay | Admitting: Internal Medicine

## 2017-06-21 DIAGNOSIS — R221 Localized swelling, mass and lump, neck: Secondary | ICD-10-CM

## 2017-06-21 DIAGNOSIS — M799 Soft tissue disorder, unspecified: Secondary | ICD-10-CM

## 2017-07-03 ENCOUNTER — Ambulatory Visit (HOSPITAL_COMMUNITY)
Admission: RE | Admit: 2017-07-03 | Discharge: 2017-07-03 | Disposition: A | Discharge status: Home / Self Care | Payer: Medicare Other | Source: Ambulatory Visit | Attending: Internal Medicine | Admitting: Internal Medicine

## 2017-07-03 DIAGNOSIS — R221 Localized swelling, mass and lump, neck: Secondary | ICD-10-CM | POA: Diagnosis not present

## 2017-07-03 DIAGNOSIS — I771 Stricture of artery: Secondary | ICD-10-CM | POA: Insufficient documentation

## 2017-07-03 DIAGNOSIS — I6522 Occlusion and stenosis of left carotid artery: Secondary | ICD-10-CM | POA: Diagnosis not present

## 2017-07-03 DIAGNOSIS — M799 Soft tissue disorder, unspecified: Secondary | ICD-10-CM

## 2017-07-03 MED ORDER — IOPAMIDOL (ISOVUE-300) INJECTION 61%
75.0000 mL | Freq: Once | INTRAVENOUS | Status: AC | PRN
  Administered 2017-07-03: 75 mL via INTRAVENOUS

## 2017-07-27 ENCOUNTER — Ambulatory Visit (INDEPENDENT_AMBULATORY_CARE_PROVIDER_SITE_OTHER): Payer: Medicare Other | Admitting: *Deleted

## 2017-07-27 ENCOUNTER — Telehealth: Payer: Self-pay | Admitting: Cardiology

## 2017-07-27 DIAGNOSIS — I428 Other cardiomyopathies: Secondary | ICD-10-CM

## 2017-07-27 DIAGNOSIS — R001 Bradycardia, unspecified: Secondary | ICD-10-CM

## 2017-07-27 NOTE — Telephone Encounter (Signed)
Spoke with pt and reminded pt of remote transmission that is due today. Pt verbalized understanding.   

## 2017-07-28 ENCOUNTER — Encounter: Payer: Self-pay | Admitting: Cardiology

## 2017-07-28 NOTE — Progress Notes (Signed)
Remote pacemaker transmission.   

## 2017-08-11 ENCOUNTER — Encounter: Payer: Medicare Other | Admitting: Internal Medicine

## 2017-08-14 ENCOUNTER — Encounter: Payer: Medicare Other | Admitting: Internal Medicine

## 2017-08-14 DIAGNOSIS — L820 Inflamed seborrheic keratosis: Secondary | ICD-10-CM | POA: Diagnosis not present

## 2017-08-14 DIAGNOSIS — D225 Melanocytic nevi of trunk: Secondary | ICD-10-CM | POA: Diagnosis not present

## 2017-08-14 DIAGNOSIS — D485 Neoplasm of uncertain behavior of skin: Secondary | ICD-10-CM | POA: Diagnosis not present

## 2017-08-14 DIAGNOSIS — L570 Actinic keratosis: Secondary | ICD-10-CM | POA: Diagnosis not present

## 2017-08-14 DIAGNOSIS — C44712 Basal cell carcinoma of skin of right lower limb, including hip: Secondary | ICD-10-CM | POA: Diagnosis not present

## 2017-08-14 DIAGNOSIS — L814 Other melanin hyperpigmentation: Secondary | ICD-10-CM | POA: Diagnosis not present

## 2017-08-14 DIAGNOSIS — Z85828 Personal history of other malignant neoplasm of skin: Secondary | ICD-10-CM | POA: Diagnosis not present

## 2017-08-14 DIAGNOSIS — L738 Other specified follicular disorders: Secondary | ICD-10-CM | POA: Diagnosis not present

## 2017-08-14 LAB — CUP PACEART REMOTE DEVICE CHECK
Battery Remaining Longevity: 132 mo
Battery Remaining Percentage: 100 %
Brady Statistic RA Percent Paced: 29 %
Date Time Interrogation Session: 20190131183800
Implantable Lead Implant Date: 20170106
Implantable Lead Implant Date: 20170106
Implantable Lead Location: 753860
Implantable Lead Model: 4677
Implantable Lead Model: 7741
Implantable Lead Model: 7742
Implantable Lead Serial Number: 507382
Lead Channel Impedance Value: 645 Ohm
Lead Channel Impedance Value: 675 Ohm
Lead Channel Pacing Threshold Pulse Width: 0.4 ms
Lead Channel Setting Pacing Amplitude: 2.2 V
Lead Channel Setting Pacing Pulse Width: 0.4 ms
Lead Channel Setting Pacing Pulse Width: 0.4 ms
Lead Channel Setting Sensing Sensitivity: 2.5 mV
MDC IDC LEAD IMPLANT DT: 20170106
MDC IDC LEAD LOCATION: 753858
MDC IDC LEAD LOCATION: 753859
MDC IDC LEAD SERIAL: 695539
MDC IDC LEAD SERIAL: 724660
MDC IDC MSMT LEADCHNL LV PACING THRESHOLD AMPLITUDE: 1.2 V
MDC IDC MSMT LEADCHNL LV PACING THRESHOLD PULSEWIDTH: 0.4 ms
MDC IDC MSMT LEADCHNL RV IMPEDANCE VALUE: 689 Ohm
MDC IDC MSMT LEADCHNL RV PACING THRESHOLD AMPLITUDE: 0.7 V
MDC IDC PG IMPLANT DT: 20170106
MDC IDC PG SERIAL: 714587
MDC IDC SET LEADCHNL RA PACING AMPLITUDE: 2 V
MDC IDC SET LEADCHNL RV PACING AMPLITUDE: 2 V
MDC IDC SET LEADCHNL RV SENSING SENSITIVITY: 2.5 mV
MDC IDC STAT BRADY RV PERCENT PACED: 98 %

## 2017-09-26 DIAGNOSIS — C44712 Basal cell carcinoma of skin of right lower limb, including hip: Secondary | ICD-10-CM | POA: Diagnosis not present

## 2017-09-26 DIAGNOSIS — L820 Inflamed seborrheic keratosis: Secondary | ICD-10-CM | POA: Diagnosis not present

## 2017-09-26 DIAGNOSIS — L570 Actinic keratosis: Secondary | ICD-10-CM | POA: Diagnosis not present

## 2017-09-26 DIAGNOSIS — Z48817 Encounter for surgical aftercare following surgery on the skin and subcutaneous tissue: Secondary | ICD-10-CM | POA: Diagnosis not present

## 2017-09-27 ENCOUNTER — Encounter: Payer: Medicare Other | Admitting: Internal Medicine

## 2017-10-04 ENCOUNTER — Ambulatory Visit: Payer: Medicare Other | Admitting: Internal Medicine

## 2017-10-04 ENCOUNTER — Encounter: Payer: Self-pay | Admitting: Internal Medicine

## 2017-10-04 VITALS — BP 142/68 | HR 84 | Ht 73.0 in | Wt 182.0 lb

## 2017-10-04 DIAGNOSIS — R001 Bradycardia, unspecified: Secondary | ICD-10-CM

## 2017-10-04 NOTE — Progress Notes (Signed)
HPI [name] returns today for ongoing evaluation and management of CHB, s/p PPM insertion with chronic systolic heart failure. He has done well in the interim. He notes that he sometimes pushes himself a little too hard when he walks on the treadmill.  He was running at a 6 mile an hour clip last week and thinks that he overdid it and is felt poorly for a couple days after.  He denies syncope and has no chest pain with exertion.  No peripheral edema. No Known Allergies   Current Outpatient Medications  Medication Sig Dispense Refill  . aspirin 81 MG tablet Take 1 tablet (81 mg total) by mouth daily. 30 tablet   . carvedilol (COREG) 3.125 MG tablet TAKE (1) TABLET BY MOUTH TWICE DAILY WITH A MEAL. 180 tablet 0  . pravastatin (PRAVACHOL) 10 MG tablet Take 10 mg by mouth daily.     No current facility-administered medications for this visit.      Past Medical History:  Diagnosis Date  . Atrial fibrillation ([place])   . Bradycardia   . Cardiomyopathy 2005   a. Possibly alcoholic; b. cath in 6761-95% ostial D1, PCW of 12, EF of 35-40%;  c. EF of 0.25 in 11/2003;  d. 40-45% in 05/2004;  e. 25% in 10/2008 by echo;  f. 04/2015 Echo: EF 40-45%, diff HK, sev inflat/inf HK, Gr 1 DD, mild AI, triv TR.  . Carotid artery occlusion   . Cerebrovascular disease    Carotid ultrasound in 12/2010-60-79% left internal carotid artery, less than 40% or right RICA, no change  . Chronic kidney disease    Creatinine of 1.6 in 2009  . Hyperlipidemia 10/15/2011   Lipid profile in 03/2009:231, 136, 56, 148  . Left bundle branch block   . Prostate carcinoma ([place])   . Syncope    [place] CRT-P 07/03/15 Dr. Lovena Le    ROS:   All systems reviewed and negative except as noted in the HPI.   Past Surgical History:  Procedure Laterality Date  . CATARACT EXTRACTION     Right  . COLONOSCOPY N/A 08/16/2012   Procedure: COLONOSCOPY;  Surgeon: Rogene Houston, MD;  [facility];  Service: Endoscopy;   Laterality: N/A;  930  . COLONOSCOPY N/A 10/21/2015   Procedure: COLONOSCOPY;  Surgeon: Rogene Houston, MD;  [facility];  Service: Endoscopy;  Laterality: N/A;  1200  . COLONOSCOPY W/ POLYPECTOMY  2010   Diverticulosis  . EP IMPLANTABLE DEVICE N/A 07/03/2015   Procedure: BiV Pacemaker Insertion CRT-P;  Surgeon: Evans Lance, MD;  Location: [facility];  Service: Cardiovascular;  Laterality: N/A;  . EYE SURGERY Right    Cataract  . HERNIA REPAIR       Family History  Problem Relation Age of Onset  . Hypertension Mother      Social History   Socioeconomic History  . Marital status: Married    Spouse name: Not on file  . Number of children: Not on file  . Years of education: Not on file  . Highest education level: Not on file  Occupational History  . Not on file  Social Needs  . Financial resource strain: Not on file  . Food insecurity:    Worry: Not on file    Inability: Not on file  . Transportation needs:    Medical: Not on file    Non-medical: Not on file  Tobacco Use  . Smoking status: Former  Smoker    Packs/day: 1.00    Years: 30.00    Pack years: 30.00    Last attempt to quit: 09/28/1973    Years since quitting: 44.0  . Smokeless tobacco: Never Used  Substance and Sexual Activity  . Alcohol use: Yes    Alcohol/week: 3.0 oz    Types: 5 Glasses of wine per week    Comment: History of excessive alcohol use; Alternates between glass of wine vs. Liquor drink 5 days per week (04/2015)  . Drug use: No  . Sexual activity: Not on file  Lifestyle  . Physical activity:    Days per week: Not on file    Minutes per session: Not on file  . Stress: Not on file  Relationships  . Social connections:    Talks on phone: Not on file    Gets together: Not on file    Attends religious service: Not on file    Active member of club or organization: Not on file    Attends meetings of clubs or organizations: Not on file    Relationship status: Not on file    . Intimate partner violence:    Fear of current or ex partner: Not on file    Emotionally abused: Not on file    Physically abused: Not on file    Forced sexual activity: Not on file  Other Topics Concern  . Not on file  Social History Narrative  . Not on file     BP (!) 142/68 (BP Location: Right Arm)   Pulse 84   Ht 6\' 1"  (1.854 m)   Wt 182 lb (82.6 kg)   SpO2 95%   BMI 24.01 kg/m   Physical Exam:  Well appearing 84 year old man, NAD HEENT: Unremarkable Neck:  No JVD, no thyromegally Lymphatics:  No adenopathy Back:  No CVA tenderness Lungs:  Clear, with no wheezes, rales, or rhonchi.  Well-healed pacemaker incision HEART:  Regular rate rhythm, no murmurs, no rubs, no clicks Abd:  soft, positive bowel sounds, no organomegally, no rebound, no guarding Ext:  2 plus pulses, no edema, no cyanosis, no clubbing Skin:  No rashes no nodules Neuro:  CN II through XII intact, motor grossly intact  EKG -normal sinus rhythm with biventricular pacing  DEVICE  Normal device function.  See PaceArt for details.   Assess/Plan: 1.  Chronic systolic heart failure -his symptoms are class IIa.  He will continue his current medications maintain a low-sodium diet. 2.  Complete heart block -he is status post pacemaker insertion. 3.  Pacemaker -is Database administrator pacemaker is working normally.  We will plan to see him back in several months for recheck. 4.  Carotid vascular disease -he is currently asymptomatic. 5.  Hypertension -his systolic pressure is a little high today.  He notes that at home and is better.  He is a bit anxious.  He is encouraged to continue his medical therapy and maintain a low-sodium diet.  Cristopher Peru, MD

## 2017-10-04 NOTE — Patient Instructions (Signed)
Medication Instructions:  Your physician recommends that you continue on your current medications as directed. Please refer to the Current Medication list given to you today.   Labwork: NONE   Testing/Procedures: NONE   Follow-Up: Your physician wants you to follow-up in: 1 year with Dr. Lovena Le. You will receive a reminder letter in the mail two months in advance. If you don't receive a letter, please call our office to schedule the follow-up appointment.   Any Other Special Instructions Will Be Listed Below (If Applicable). Do not exercise as hard     If you need a refill on your cardiac medications before your next appointment, please call your pharmacy.  Thank you for choosing [facility]!

## 2017-10-05 ENCOUNTER — Other Ambulatory Visit: Payer: Self-pay | Admitting: Internal Medicine

## 2017-10-12 LAB — CUP PACEART INCLINIC DEVICE CHECK
Date Time Interrogation Session: 20190418125254
Implantable Lead Implant Date: 20170106
Implantable Lead Implant Date: 20170106
Implantable Lead Location: 753858
Implantable Lead Model: 4677
Implantable Lead Model: 7742
Implantable Lead Serial Number: 507382
Implantable Lead Serial Number: 695539
Implantable Pulse Generator Implant Date: 20170106
MDC IDC LEAD IMPLANT DT: 20170106
MDC IDC LEAD LOCATION: 753859
MDC IDC LEAD LOCATION: 753860
MDC IDC LEAD SERIAL: 724660
Pulse Gen Serial Number: 714587

## 2017-10-26 ENCOUNTER — Ambulatory Visit (INDEPENDENT_AMBULATORY_CARE_PROVIDER_SITE_OTHER): Payer: Medicare Other | Admitting: *Deleted

## 2017-10-26 ENCOUNTER — Telehealth: Payer: Self-pay | Admitting: Cardiology

## 2017-10-26 DIAGNOSIS — I428 Other cardiomyopathies: Secondary | ICD-10-CM

## 2017-10-26 NOTE — Telephone Encounter (Signed)
Spoke with pt and reminded pt of remote transmission that is due today. Pt verbalized understanding.   

## 2017-10-26 NOTE — Progress Notes (Signed)
Remote pacemaker transmission.   

## 2017-10-31 ENCOUNTER — Encounter: Payer: Self-pay | Admitting: Cardiology

## 2017-10-31 ENCOUNTER — Ambulatory Visit (HOSPITAL_COMMUNITY)
Admission: RE | Admit: 2017-10-31 | Discharge: 2017-10-31 | Disposition: A | Discharge status: Home / Self Care | Payer: Medicare Other | Source: Ambulatory Visit | Attending: Family | Admitting: Family

## 2017-10-31 ENCOUNTER — Ambulatory Visit: Payer: Medicare Other | Admitting: Family

## 2017-10-31 DIAGNOSIS — I6523 Occlusion and stenosis of bilateral carotid arteries: Secondary | ICD-10-CM | POA: Insufficient documentation

## 2017-10-31 DIAGNOSIS — Z87891 Personal history of nicotine dependence: Secondary | ICD-10-CM | POA: Diagnosis not present

## 2017-11-01 ENCOUNTER — Telehealth: Payer: Self-pay | Admitting: Vascular Surgery

## 2017-11-01 NOTE — Telephone Encounter (Signed)
Sched appt 08/07/17; lab at 1:00 and NP at 1:45. Pt aware they are getting appt letter.

## 2017-11-01 NOTE — Telephone Encounter (Signed)
-----   Message from Viann Fish, NP sent at 10/31/2017 11:08 PM EDT ----- Regarding: schedule follow up Dear schedulers, Pt had a carotid duplex today, was supposed to see me, but did not.  Please schedule him to return in 9 months for carotid duplex and see me afterward. There has been no change in the results since the last visit.  Thank you,  Vinnie Level

## 2017-11-02 ENCOUNTER — Other Ambulatory Visit: Payer: Self-pay

## 2017-11-02 DIAGNOSIS — Z6824 Body mass index (BMI) 24.0-24.9, adult: Secondary | ICD-10-CM | POA: Diagnosis not present

## 2017-11-02 DIAGNOSIS — R42 Dizziness and giddiness: Secondary | ICD-10-CM | POA: Diagnosis not present

## 2017-11-02 DIAGNOSIS — I6523 Occlusion and stenosis of bilateral carotid arteries: Secondary | ICD-10-CM

## 2017-11-02 DIAGNOSIS — I5022 Chronic systolic (congestive) heart failure: Secondary | ICD-10-CM | POA: Diagnosis not present

## 2017-11-09 LAB — CUP PACEART REMOTE DEVICE CHECK
Brady Statistic RA Percent Paced: 18 %
Implantable Lead Implant Date: 20170106
Implantable Lead Location: 753859
Implantable Lead Location: 753860
Implantable Lead Model: 4677
Implantable Lead Model: 7741
Implantable Lead Model: 7742
Implantable Lead Serial Number: 507382
Implantable Lead Serial Number: 695539
Implantable Pulse Generator Implant Date: 20170106
Lead Channel Impedance Value: 620 Ohm
Lead Channel Impedance Value: 643 Ohm
Lead Channel Impedance Value: 668 Ohm
Lead Channel Pacing Threshold Amplitude: 0.8 V
Lead Channel Pacing Threshold Pulse Width: 0.4 ms
Lead Channel Setting Pacing Amplitude: 2 V
Lead Channel Setting Pacing Amplitude: 2 V
Lead Channel Setting Pacing Pulse Width: 0.4 ms
MDC IDC LEAD IMPLANT DT: 20170106
MDC IDC LEAD IMPLANT DT: 20170106
MDC IDC LEAD LOCATION: 753858
MDC IDC LEAD SERIAL: 724660
MDC IDC MSMT BATTERY REMAINING LONGEVITY: 120 mo
MDC IDC MSMT BATTERY REMAINING PERCENTAGE: 100 %
MDC IDC MSMT LEADCHNL LV PACING THRESHOLD AMPLITUDE: 1.3 V
MDC IDC MSMT LEADCHNL LV PACING THRESHOLD PULSEWIDTH: 0.4 ms
MDC IDC SESS DTM: 20190502175900
MDC IDC SET LEADCHNL LV PACING AMPLITUDE: 2.2 V
MDC IDC SET LEADCHNL LV PACING PULSEWIDTH: 0.4 ms
MDC IDC SET LEADCHNL LV SENSING SENSITIVITY: 2.5 mV
MDC IDC SET LEADCHNL RV SENSING SENSITIVITY: 2.5 mV
MDC IDC STAT BRADY RV PERCENT PACED: 95 %
Pulse Gen Serial Number: 714587

## 2017-12-12 DIAGNOSIS — I2510 Atherosclerotic heart disease of native coronary artery without angina pectoris: Secondary | ICD-10-CM | POA: Diagnosis not present

## 2017-12-12 DIAGNOSIS — Z79899 Other long term (current) drug therapy: Secondary | ICD-10-CM | POA: Diagnosis not present

## 2017-12-12 DIAGNOSIS — I5022 Chronic systolic (congestive) heart failure: Secondary | ICD-10-CM | POA: Diagnosis not present

## 2017-12-12 DIAGNOSIS — Z125 Encounter for screening for malignant neoplasm of prostate: Secondary | ICD-10-CM | POA: Diagnosis not present

## 2017-12-12 DIAGNOSIS — N183 Chronic kidney disease, stage 3 (moderate): Secondary | ICD-10-CM | POA: Diagnosis not present

## 2017-12-15 DIAGNOSIS — N183 Chronic kidney disease, stage 3 (moderate): Secondary | ICD-10-CM | POA: Diagnosis not present

## 2017-12-15 DIAGNOSIS — I5022 Chronic systolic (congestive) heart failure: Secondary | ICD-10-CM | POA: Diagnosis not present

## 2017-12-15 DIAGNOSIS — D649 Anemia, unspecified: Secondary | ICD-10-CM | POA: Diagnosis not present

## 2018-01-05 DIAGNOSIS — D649 Anemia, unspecified: Secondary | ICD-10-CM | POA: Diagnosis not present

## 2018-01-05 DIAGNOSIS — Z79899 Other long term (current) drug therapy: Secondary | ICD-10-CM | POA: Diagnosis not present

## 2018-01-05 DIAGNOSIS — I5022 Chronic systolic (congestive) heart failure: Secondary | ICD-10-CM | POA: Diagnosis not present

## 2018-01-05 DIAGNOSIS — N183 Chronic kidney disease, stage 3 (moderate): Secondary | ICD-10-CM | POA: Diagnosis not present

## 2018-01-10 DIAGNOSIS — D509 Iron deficiency anemia, unspecified: Secondary | ICD-10-CM | POA: Diagnosis not present

## 2018-01-25 ENCOUNTER — Ambulatory Visit (INDEPENDENT_AMBULATORY_CARE_PROVIDER_SITE_OTHER): Payer: Medicare Other | Admitting: *Deleted

## 2018-01-25 ENCOUNTER — Telehealth: Payer: Self-pay

## 2018-01-25 DIAGNOSIS — R001 Bradycardia, unspecified: Secondary | ICD-10-CM | POA: Diagnosis not present

## 2018-01-25 DIAGNOSIS — I428 Other cardiomyopathies: Secondary | ICD-10-CM

## 2018-01-25 NOTE — Telephone Encounter (Signed)
Confirmed remote transmission w/ pt wife.   

## 2018-01-26 NOTE — Progress Notes (Signed)
Remote pacemaker transmission.   

## 2018-03-08 LAB — CUP PACEART REMOTE DEVICE CHECK
Battery Remaining Longevity: 114 mo
Battery Remaining Percentage: 100 %
Brady Statistic RA Percent Paced: 29 %
Brady Statistic RV Percent Paced: 94 %
Date Time Interrogation Session: 20190801184400
Implantable Lead Implant Date: 20170106
Implantable Lead Location: 753859
Implantable Lead Location: 753860
Implantable Lead Model: 4677
Implantable Lead Model: 7741
Implantable Lead Model: 7742
Implantable Lead Serial Number: 507382
Implantable Lead Serial Number: 695539
Lead Channel Impedance Value: 626 Ohm
Lead Channel Impedance Value: 706 Ohm
Lead Channel Pacing Threshold Amplitude: 1.3 V
Lead Channel Pacing Threshold Pulse Width: 0.4 ms
Lead Channel Setting Pacing Amplitude: 2 V
Lead Channel Setting Pacing Amplitude: 2.2 V
Lead Channel Setting Pacing Pulse Width: 0.4 ms
Lead Channel Setting Sensing Sensitivity: 2.5 mV
Lead Channel Setting Sensing Sensitivity: 2.5 mV
MDC IDC LEAD IMPLANT DT: 20170106
MDC IDC LEAD IMPLANT DT: 20170106
MDC IDC LEAD LOCATION: 753858
MDC IDC LEAD SERIAL: 724660
MDC IDC MSMT LEADCHNL RA IMPEDANCE VALUE: 721 Ohm
MDC IDC MSMT LEADCHNL RV PACING THRESHOLD AMPLITUDE: 0.8 V
MDC IDC MSMT LEADCHNL RV PACING THRESHOLD PULSEWIDTH: 0.4 ms
MDC IDC PG IMPLANT DT: 20170106
MDC IDC PG SERIAL: 714587
MDC IDC SET LEADCHNL RA PACING AMPLITUDE: 2 V
MDC IDC SET LEADCHNL RV PACING PULSEWIDTH: 0.4 ms

## 2018-03-26 DIAGNOSIS — D508 Other iron deficiency anemias: Secondary | ICD-10-CM | POA: Diagnosis not present

## 2018-03-28 DIAGNOSIS — Z23 Encounter for immunization: Secondary | ICD-10-CM | POA: Diagnosis not present

## 2018-03-28 DIAGNOSIS — D509 Iron deficiency anemia, unspecified: Secondary | ICD-10-CM | POA: Diagnosis not present

## 2018-04-27 ENCOUNTER — Ambulatory Visit (INDEPENDENT_AMBULATORY_CARE_PROVIDER_SITE_OTHER): Payer: Medicare Other | Admitting: *Deleted

## 2018-04-27 ENCOUNTER — Telehealth: Payer: Self-pay | Admitting: Cardiology

## 2018-04-27 DIAGNOSIS — I428 Other cardiomyopathies: Secondary | ICD-10-CM | POA: Diagnosis not present

## 2018-04-27 NOTE — Telephone Encounter (Signed)
Spoke with pt and reminded pt of remote transmission that is due today. Pt verbalized understanding.   

## 2018-04-28 NOTE — Progress Notes (Signed)
Remote pacemaker transmission.   

## 2018-04-30 ENCOUNTER — Encounter: Payer: Self-pay | Admitting: Cardiology

## 2018-05-08 DIAGNOSIS — L570 Actinic keratosis: Secondary | ICD-10-CM | POA: Diagnosis not present

## 2018-05-08 DIAGNOSIS — D485 Neoplasm of uncertain behavior of skin: Secondary | ICD-10-CM | POA: Diagnosis not present

## 2018-05-08 DIAGNOSIS — L821 Other seborrheic keratosis: Secondary | ICD-10-CM | POA: Diagnosis not present

## 2018-06-22 DIAGNOSIS — J449 Chronic obstructive pulmonary disease, unspecified: Secondary | ICD-10-CM | POA: Diagnosis not present

## 2018-06-22 DIAGNOSIS — I5022 Chronic systolic (congestive) heart failure: Secondary | ICD-10-CM | POA: Diagnosis not present

## 2018-06-22 DIAGNOSIS — N183 Chronic kidney disease, stage 3 (moderate): Secondary | ICD-10-CM | POA: Diagnosis not present

## 2018-06-22 DIAGNOSIS — I2510 Atherosclerotic heart disease of native coronary artery without angina pectoris: Secondary | ICD-10-CM | POA: Diagnosis not present

## 2018-06-25 DIAGNOSIS — I2510 Atherosclerotic heart disease of native coronary artery without angina pectoris: Secondary | ICD-10-CM | POA: Diagnosis not present

## 2018-06-25 DIAGNOSIS — N183 Chronic kidney disease, stage 3 (moderate): Secondary | ICD-10-CM | POA: Diagnosis not present

## 2018-06-27 LAB — CUP PACEART REMOTE DEVICE CHECK
Date Time Interrogation Session: 20200101191450
Implantable Lead Implant Date: 20170106
Implantable Lead Implant Date: 20170106
Implantable Lead Location: 753859
Implantable Lead Model: 4677
Implantable Lead Model: 7742
Implantable Lead Serial Number: 507382
Implantable Lead Serial Number: 724660
Implantable Pulse Generator Implant Date: 20170106
MDC IDC LEAD IMPLANT DT: 20170106
MDC IDC LEAD LOCATION: 753858
MDC IDC LEAD LOCATION: 753860
MDC IDC LEAD SERIAL: 695539
Pulse Gen Serial Number: 714587

## 2018-06-29 DIAGNOSIS — N183 Chronic kidney disease, stage 3 (moderate): Secondary | ICD-10-CM | POA: Diagnosis not present

## 2018-06-29 DIAGNOSIS — Z6824 Body mass index (BMI) 24.0-24.9, adult: Secondary | ICD-10-CM | POA: Diagnosis not present

## 2018-06-29 DIAGNOSIS — I5022 Chronic systolic (congestive) heart failure: Secondary | ICD-10-CM | POA: Diagnosis not present

## 2018-06-29 DIAGNOSIS — Z0001 Encounter for general adult medical examination with abnormal findings: Secondary | ICD-10-CM | POA: Diagnosis not present

## 2018-07-30 ENCOUNTER — Ambulatory Visit (INDEPENDENT_AMBULATORY_CARE_PROVIDER_SITE_OTHER): Payer: Medicare Other

## 2018-07-30 DIAGNOSIS — I428 Other cardiomyopathies: Secondary | ICD-10-CM | POA: Diagnosis not present

## 2018-07-30 DIAGNOSIS — R001 Bradycardia, unspecified: Secondary | ICD-10-CM

## 2018-08-01 LAB — CUP PACEART REMOTE DEVICE CHECK
Battery Remaining Percentage: 100 %
Brady Statistic RV Percent Paced: 95 %
Implantable Lead Implant Date: 20170106
Implantable Lead Location: 753860
Implantable Lead Model: 4677
Implantable Lead Serial Number: 507382
Implantable Lead Serial Number: 695539
Lead Channel Impedance Value: 677 Ohm
Lead Channel Impedance Value: 755 Ohm
Lead Channel Pacing Threshold Amplitude: 0.8 V
Lead Channel Pacing Threshold Amplitude: 1.3 V
Lead Channel Pacing Threshold Pulse Width: 0.4 ms
Lead Channel Setting Pacing Amplitude: 2 V
Lead Channel Setting Pacing Amplitude: 2 V
Lead Channel Setting Pacing Pulse Width: 0.4 ms
Lead Channel Setting Sensing Sensitivity: 2.5 mV
MDC IDC LEAD IMPLANT DT: 20170106
MDC IDC LEAD IMPLANT DT: 20170106
MDC IDC LEAD LOCATION: 753858
MDC IDC LEAD LOCATION: 753859
MDC IDC LEAD SERIAL: 724660
MDC IDC MSMT BATTERY REMAINING LONGEVITY: 108 mo
MDC IDC MSMT LEADCHNL LV IMPEDANCE VALUE: 627 Ohm
MDC IDC MSMT LEADCHNL RV PACING THRESHOLD PULSEWIDTH: 0.4 ms
MDC IDC PG IMPLANT DT: 20170106
MDC IDC SESS DTM: 20200203132600
MDC IDC SET LEADCHNL LV PACING AMPLITUDE: 2.2 V
MDC IDC SET LEADCHNL RV PACING PULSEWIDTH: 0.4 ms
MDC IDC SET LEADCHNL RV SENSING SENSITIVITY: 2.5 mV
MDC IDC STAT BRADY RA PERCENT PACED: 32 %
Pulse Gen Serial Number: 714587

## 2018-08-07 ENCOUNTER — Ambulatory Visit: Payer: Medicare Other | Admitting: Physician Assistant

## 2018-08-07 ENCOUNTER — Encounter: Payer: Self-pay | Admitting: Family

## 2018-08-07 ENCOUNTER — Inpatient Hospital Stay (HOSPITAL_COMMUNITY): Admission: RE | Admit: 2018-08-07 | Payer: Medicare Other | Source: Ambulatory Visit

## 2018-08-07 NOTE — Progress Notes (Signed)
Remote pacemaker transmission.   

## 2018-09-03 ENCOUNTER — Ambulatory Visit (HOSPITAL_COMMUNITY)
Admission: RE | Admit: 2018-09-03 | Discharge: 2018-09-03 | Disposition: A | Discharge status: Home / Self Care | Payer: Medicare Other | Source: Ambulatory Visit | Attending: Family | Admitting: Family

## 2018-09-03 ENCOUNTER — Encounter: Payer: Self-pay | Admitting: Family

## 2018-09-03 ENCOUNTER — Encounter: Payer: Medicare Other | Admitting: Family

## 2018-09-03 ENCOUNTER — Other Ambulatory Visit: Payer: Self-pay

## 2018-09-03 VITALS — BP 113/68 | HR 59 | Resp 18 | Ht 73.0 in | Wt 182.4 lb

## 2018-09-03 DIAGNOSIS — I6523 Occlusion and stenosis of bilateral carotid arteries: Secondary | ICD-10-CM

## 2018-09-03 DIAGNOSIS — Z87891 Personal history of nicotine dependence: Secondary | ICD-10-CM

## 2018-09-03 NOTE — Patient Instructions (Signed)

## 2018-09-03 NOTE — Progress Notes (Signed)
Pt left before I could evaluate him.   Carotid Duplex (09-03-18):  Right Carotid: Velocities in the right ICA are consistent with a 1-39% stenosis.  Left Carotid: Velocities in the left ICA are consistent with a 60-79% stenosis.  Vertebrals:  Bilateral vertebral arteries demonstrate antegrade flow. Subclavians: Normal flow hemodynamics were seen in bilateral subclavian              arteries.

## 2018-09-04 ENCOUNTER — Telehealth: Payer: Self-pay | Admitting: Family

## 2018-09-04 DIAGNOSIS — D508 Other iron deficiency anemias: Secondary | ICD-10-CM | POA: Diagnosis not present

## 2018-09-04 NOTE — Telephone Encounter (Signed)
-----   Message from Viann Fish, NP sent at 09/03/2018  6:38 PM EDT ----- Regarding: call pt with results please Pt left before I could evaluate him.   No change in the degree of blockage of the carotid arteries in his neck.  Right Carotid: 1-39% stenosis.  Left Carotid: 60-79% stenosis.  He has not been evaluated by a medical provider since his visit in August 2018. He left before I could evaluate him today, and at his last carotid duplex.   Please schedule him to return in 6 months with carotid duplex, see me afterward.  Please advise him to call 911 if he has any stroke or mini stroke symptoms.

## 2018-09-04 NOTE — Telephone Encounter (Signed)
Pt notified and verbalizes understanding.  Will call back to reschedule appt and Carotid ultrasound.  Thurston Hole., LPN

## 2018-09-11 DIAGNOSIS — D509 Iron deficiency anemia, unspecified: Secondary | ICD-10-CM | POA: Diagnosis not present

## 2018-09-11 DIAGNOSIS — I2510 Atherosclerotic heart disease of native coronary artery without angina pectoris: Secondary | ICD-10-CM | POA: Diagnosis not present

## 2018-11-05 ENCOUNTER — Other Ambulatory Visit: Payer: Self-pay | Admitting: Internal Medicine

## 2018-11-05 ENCOUNTER — Telehealth: Payer: Self-pay

## 2018-11-05 ENCOUNTER — Ambulatory Visit (INDEPENDENT_AMBULATORY_CARE_PROVIDER_SITE_OTHER): Payer: Medicare Other | Admitting: *Deleted

## 2018-11-05 ENCOUNTER — Other Ambulatory Visit: Payer: Self-pay

## 2018-11-05 DIAGNOSIS — I428 Other cardiomyopathies: Secondary | ICD-10-CM

## 2018-11-05 DIAGNOSIS — R001 Bradycardia, unspecified: Secondary | ICD-10-CM

## 2018-11-05 MED ORDER — PRAVASTATIN SODIUM 10 MG PO TABS: 10.0000 mg | ORAL_TABLET | Freq: Every day | ORAL | 2 refills | Status: DC

## 2018-11-05 MED ORDER — CARVEDILOL 3.125 MG PO TABS: ORAL_TABLET | ORAL | 3 refills | Status: DC

## 2018-11-05 NOTE — Telephone Encounter (Signed)
°*  STAT* If patient is at the pharmacy, call can be transferred to refill team.   1. Which medications need to be refilled?   carvedilol (COREG) 3.125 MG tablet   pravastatin (PRAVACHOL) 10 MG tablet     2. Which pharmacy/location (including street and city if local pharmacy) is medication to be sent to?[place]  3. Do they need a 30 day or 90 day supply?

## 2018-11-05 NOTE — Telephone Encounter (Signed)
Spoke with patient to remind of missed remote transmission 

## 2018-11-05 NOTE — Telephone Encounter (Signed)
refills complete

## 2018-11-06 LAB — CUP PACEART REMOTE DEVICE CHECK
Battery Remaining Longevity: 102 mo
Battery Remaining Percentage: 100 %
Brady Statistic RA Percent Paced: 34 %
Brady Statistic RV Percent Paced: 95 %
Date Time Interrogation Session: 20200511193600
Implantable Lead Implant Date: 20170106
Implantable Lead Implant Date: 20170106
Implantable Lead Implant Date: 20170106
Implantable Lead Location: 753858
Implantable Lead Location: 753859
Implantable Lead Location: 753860
Implantable Lead Model: 4677
Implantable Lead Model: 7741
Implantable Lead Model: 7742
Implantable Lead Serial Number: 507382
Implantable Lead Serial Number: 695539
Implantable Lead Serial Number: 724660
Implantable Pulse Generator Implant Date: 20170106
Lead Channel Impedance Value: 621 Ohm
Lead Channel Impedance Value: 665 Ohm
Lead Channel Impedance Value: 749 Ohm
Lead Channel Pacing Threshold Amplitude: 0.8 V
Lead Channel Pacing Threshold Amplitude: 1.3 V
Lead Channel Pacing Threshold Pulse Width: 0.4 ms
Lead Channel Pacing Threshold Pulse Width: 0.4 ms
Lead Channel Setting Pacing Amplitude: 2 V
Lead Channel Setting Pacing Amplitude: 2 V
Lead Channel Setting Pacing Amplitude: 2.2 V
Lead Channel Setting Pacing Pulse Width: 0.4 ms
Lead Channel Setting Pacing Pulse Width: 0.4 ms
Lead Channel Setting Sensing Sensitivity: 2.5 mV
Lead Channel Setting Sensing Sensitivity: 2.5 mV
Pulse Gen Serial Number: 714587

## 2018-11-13 NOTE — Progress Notes (Signed)
Remote pacemaker transmission.   

## 2018-11-16 ENCOUNTER — Telehealth: Payer: Medicare Other | Admitting: Internal Medicine

## 2018-12-19 DIAGNOSIS — D508 Other iron deficiency anemias: Secondary | ICD-10-CM | POA: Diagnosis not present

## 2018-12-19 DIAGNOSIS — Z79899 Other long term (current) drug therapy: Secondary | ICD-10-CM | POA: Diagnosis not present

## 2018-12-25 DIAGNOSIS — N183 Chronic kidney disease, stage 3 (moderate): Secondary | ICD-10-CM | POA: Diagnosis not present

## 2018-12-25 DIAGNOSIS — D509 Iron deficiency anemia, unspecified: Secondary | ICD-10-CM | POA: Diagnosis not present

## 2019-02-04 ENCOUNTER — Ambulatory Visit (INDEPENDENT_AMBULATORY_CARE_PROVIDER_SITE_OTHER): Payer: Medicare Other | Admitting: *Deleted

## 2019-02-04 DIAGNOSIS — I428 Other cardiomyopathies: Secondary | ICD-10-CM

## 2019-02-04 DIAGNOSIS — I5022 Chronic systolic (congestive) heart failure: Secondary | ICD-10-CM

## 2019-02-04 LAB — CUP PACEART REMOTE DEVICE CHECK
Battery Remaining Longevity: 102 mo
Battery Remaining Percentage: 100 %
Brady Statistic RA Percent Paced: 33 %
Brady Statistic RV Percent Paced: 96 %
Date Time Interrogation Session: 20200810145700
Implantable Lead Implant Date: 20170106
Implantable Lead Implant Date: 20170106
Implantable Lead Implant Date: 20170106
Implantable Lead Location: 753858
Implantable Lead Location: 753859
Implantable Lead Location: 753860
Implantable Lead Model: 4677
Implantable Lead Model: 7741
Implantable Lead Model: 7742
Implantable Lead Serial Number: 507382
Implantable Lead Serial Number: 695539
Implantable Lead Serial Number: 724660
Implantable Pulse Generator Implant Date: 20170106
Lead Channel Impedance Value: 644 Ohm
Lead Channel Impedance Value: 697 Ohm
Lead Channel Impedance Value: 703 Ohm
Lead Channel Pacing Threshold Amplitude: 1 V
Lead Channel Pacing Threshold Amplitude: 1.3 V
Lead Channel Pacing Threshold Pulse Width: 0.4 ms
Lead Channel Pacing Threshold Pulse Width: 0.4 ms
Lead Channel Setting Pacing Amplitude: 2 V
Lead Channel Setting Pacing Amplitude: 2 V
Lead Channel Setting Pacing Amplitude: 2.2 V
Lead Channel Setting Pacing Pulse Width: 0.4 ms
Lead Channel Setting Pacing Pulse Width: 0.4 ms
Lead Channel Setting Sensing Sensitivity: 2.5 mV
Lead Channel Setting Sensing Sensitivity: 2.5 mV
Pulse Gen Serial Number: 714587

## 2019-02-12 NOTE — Progress Notes (Signed)
Remote pacemaker transmission.   

## 2019-02-13 ENCOUNTER — Telehealth (INDEPENDENT_AMBULATORY_CARE_PROVIDER_SITE_OTHER): Payer: Medicare Other | Admitting: Internal Medicine

## 2019-02-13 VITALS — Ht 77.0 in | Wt 177.0 lb

## 2019-02-13 DIAGNOSIS — I5022 Chronic systolic (congestive) heart failure: Secondary | ICD-10-CM | POA: Diagnosis not present

## 2019-02-13 NOTE — Progress Notes (Signed)
Electrophysiology TeleHealth Note   Due to national recommendations of social distancing due to COVID 19, an audio/video telehealth visit is felt to be most appropriate for this patient at this time.  See MyChart message from today for the patient's consent to telehealth for [facility].   Date:  02/13/2019   ID:  Max Cole, DOB 12-30-1927, MRN [id number]  Location: patient's home  Provider location: [address], [place]  Evaluation Performed: Follow-up visit  PCP:  Asencion Noble, MD  Cardiologist:  No primary care provider on file. none Electrophysiologist:  Dr Lovena Le  Chief Complaint:  " I am walking 3 miles a day."  History of Present Illness:    Max Cole is a 85 y.o. male who presents via audio/video conferencing for a telehealth visit today.  Since last being seen in our clinic, the patient reports doing very well. He has chronic systolic heart failure s/p BiV PPM insertion. Today, he denies symptoms of palpitations, chest pain, shortness of breath,  lower extremity edema, dizziness, presyncope, or syncope.  The patient is otherwise without complaint today.  The patient denies symptoms of fevers, chills, cough, or new SOB worrisome for COVID 19.  Past Medical History:  Diagnosis Date  . Atrial fibrillation ([place])   . Bradycardia   . Cardiomyopathy 2005   a. Possibly alcoholic; b. cath in 7902-40% ostial D1, PCW of 12, EF of 35-40%;  c. EF of 0.25 in 11/2003;  d. 40-45% in 05/2004;  e. 25% in 10/2008 by echo;  f. 04/2015 Echo: EF 40-45%, diff HK, sev inflat/inf HK, Gr 1 DD, mild AI, triv TR.  . Carotid artery occlusion   . Cerebrovascular disease    Carotid ultrasound in 12/2010-60-79% left internal carotid artery, less than 40% or right RICA, no change  . Chronic kidney disease    Creatinine of 1.6 in 2009  . Hyperlipidemia 10/15/2011   Lipid profile in 03/2009:231, 136, 56, 148  . Left bundle branch block   . Prostate carcinoma ([place])   . Syncope     [place] CRT-P 07/03/15 Dr. Lovena Le    Past Surgical History:  Procedure Laterality Date  . CATARACT EXTRACTION     Right  . COLONOSCOPY N/A 08/16/2012   Procedure: COLONOSCOPY;  Surgeon: Rogene Houston, MD;  [facility];  Service: Endoscopy;  Laterality: N/A;  930  . COLONOSCOPY N/A 10/21/2015   Procedure: COLONOSCOPY;  Surgeon: Rogene Houston, MD;  [facility];  Service: Endoscopy;  Laterality: N/A;  1200  . COLONOSCOPY W/ POLYPECTOMY  2010   Diverticulosis  . EP IMPLANTABLE DEVICE N/A 07/03/2015   Procedure: BiV Pacemaker Insertion CRT-P;  Surgeon: Evans Lance, MD;  Location: [place] [facility];  Service: Cardiovascular;  Laterality: N/A;  . EYE SURGERY Right    Cataract  . HERNIA REPAIR      Current Outpatient Medications  Medication Sig Dispense Refill  . aspirin 81 MG tablet Take 1 tablet (81 mg total) by mouth daily. 30 tablet   . carvedilol (COREG) 3.125 MG tablet TAKE (1) TABLET BY MOUTH TWICE DAILY WITH A MEAL. 180 tablet 3  . pravastatin (PRAVACHOL) 10 MG tablet Take 20 mg by mouth daily.     No current facility-administered medications for this visit.     Allergies:   Patient has no known allergies.   Social History:  The patient  reports that he quit smoking about 45 years ago. He has  a 30.00 pack-year smoking history. He has never used smokeless tobacco. He reports current alcohol use of about 5.0 standard drinks of alcohol per week. He reports that he does not use drugs.   Family History:  The patient's  family history includes Hypertension in his mother.   ROS:  Please see the history of present illness.   All other systems are personally reviewed and negative.    Exam:    Vital Signs:  Ht 6\' 5"  (1.956 m)   Wt 177 lb (80.3 kg)   BMI 20.99 kg/m    Labs/Other Tests and Data Reviewed:    Recent Labs: No results found for requested labs within last 8760 hours.   Wt Readings from Last 3 Encounters:  02/13/19 177 lb (80.3 kg)   09/03/18 182 lb 6.9 oz (82.7 kg)  10/04/17 182 lb (82.6 kg)     Other studies personally reviewed:  Last device remote is reviewed from [place] PDF dated 02/04/19 which reveals normal device function, no arrhythmias    ASSESSMENT & PLAN:    1.  chronic systolic heart failure - his symptoms are class 2. He will continue his current meds and maintain a low sodium diet. 2. Pacemaker - his [facility] ppm is working normally. 3. htn - his bp has been on the low side.    COVID 19 screen The patient denies symptoms of COVID 19 at this time.  The importance of social distancing was discussed today.  Follow-up:  12 months Next remote: 12/20  Current medicines are reviewed at length with the patient today.   The patient does not have concerns regarding his medicines.  The following changes were made today:  none  Labs/ tests ordered today include: none No orders of the defined types were placed in this encounter.    Patient Risk:  after full review of this patients clinical status, I feel that they are at moderate risk at this time.  Today, I have spent 15 minutes with the patient with telehealth technology discussing all of the above .    Signed, Cristopher Peru, MD  02/13/2019 10:59 AM     [facility] [address] [place] [address] [phone] (office) [phone] (fax)

## 2019-02-13 NOTE — Patient Instructions (Signed)
Medication Instructions:  Your physician recommends that you continue on your current medications as directed. Please refer to the Current Medication list given to you today.  If you need a refill on your cardiac medications before your next appointment, please call your pharmacy.   Lab work: NONE   If you have labs (blood work) drawn today and your tests are completely normal, you will receive your results only by: . MyChart Message (if you have MyChart) OR . A paper copy in the mail If you have any lab test that is abnormal or we need to change your treatment, we will call you to review the results.  Testing/Procedures: NONE   Follow-Up: At [facility], you and your health needs are our priority.  As part of our continuing mission to provide you with exceptional heart care, we have created designated Provider Care Teams.  These Care Teams include your primary Cardiologist (physician) and Advanced Practice Providers (APPs -  Physician Assistants and Nurse Practitioners) who all work together to provide you with the care you need, when you need it. You will need a follow up appointment in 1 years.  Please call our office 2 months in advance to schedule this appointment.  You may see Gregg Taylor, MD or one of the following Advanced Practice Providers on your designated Care Team:   Amber Seiler, NP . Renee Ursuy, PA-C  Any Other Special Instructions Will Be Listed Below (If Applicable). Thank you for choosing [facility]!     

## 2019-03-12 ENCOUNTER — Other Ambulatory Visit: Payer: Self-pay | Admitting: *Deleted

## 2019-03-15 DIAGNOSIS — Z23 Encounter for immunization: Secondary | ICD-10-CM | POA: Diagnosis not present

## 2019-04-08 DIAGNOSIS — L814 Other melanin hyperpigmentation: Secondary | ICD-10-CM | POA: Diagnosis not present

## 2019-04-08 DIAGNOSIS — D225 Melanocytic nevi of trunk: Secondary | ICD-10-CM | POA: Diagnosis not present

## 2019-04-08 DIAGNOSIS — C44519 Basal cell carcinoma of skin of other part of trunk: Secondary | ICD-10-CM | POA: Diagnosis not present

## 2019-04-08 DIAGNOSIS — L812 Freckles: Secondary | ICD-10-CM | POA: Diagnosis not present

## 2019-04-08 DIAGNOSIS — L820 Inflamed seborrheic keratosis: Secondary | ICD-10-CM | POA: Diagnosis not present

## 2019-04-08 DIAGNOSIS — D485 Neoplasm of uncertain behavior of skin: Secondary | ICD-10-CM | POA: Diagnosis not present

## 2019-04-08 DIAGNOSIS — C441192 Basal cell carcinoma of skin of left lower eyelid, including canthus: Secondary | ICD-10-CM | POA: Diagnosis not present

## 2019-04-08 DIAGNOSIS — L570 Actinic keratosis: Secondary | ICD-10-CM | POA: Diagnosis not present

## 2019-04-08 DIAGNOSIS — L853 Xerosis cutis: Secondary | ICD-10-CM | POA: Diagnosis not present

## 2019-04-15 ENCOUNTER — Telehealth: Payer: Self-pay | Admitting: Internal Medicine

## 2019-04-15 MED ORDER — PRAVASTATIN SODIUM 20 MG PO TABS: 20.0000 mg | ORAL_TABLET | Freq: Every evening | ORAL | 3 refills | Status: DC

## 2019-04-15 NOTE — Telephone Encounter (Signed)
Needing a refill on his pravastatin (PRAVACHOL) 10 MG tablet [id number]  Sent to [facility] he's supposed to be taking 20mg , but he's not getting enough in the Rx to take 2 a day so he's running out faster

## 2019-04-15 NOTE — Telephone Encounter (Signed)
DONE

## 2019-04-19 DIAGNOSIS — N183 Chronic kidney disease, stage 3 unspecified: Secondary | ICD-10-CM | POA: Diagnosis not present

## 2019-04-19 DIAGNOSIS — D508 Other iron deficiency anemias: Secondary | ICD-10-CM | POA: Diagnosis not present

## 2019-04-19 DIAGNOSIS — Z79899 Other long term (current) drug therapy: Secondary | ICD-10-CM | POA: Diagnosis not present

## 2019-04-26 DIAGNOSIS — D509 Iron deficiency anemia, unspecified: Secondary | ICD-10-CM | POA: Diagnosis not present

## 2019-04-26 DIAGNOSIS — N183 Chronic kidney disease, stage 3 unspecified: Secondary | ICD-10-CM | POA: Diagnosis not present

## 2019-04-26 DIAGNOSIS — I5022 Chronic systolic (congestive) heart failure: Secondary | ICD-10-CM | POA: Diagnosis not present

## 2019-05-06 ENCOUNTER — Ambulatory Visit (INDEPENDENT_AMBULATORY_CARE_PROVIDER_SITE_OTHER): Payer: Medicare Other | Admitting: *Deleted

## 2019-05-06 DIAGNOSIS — R55 Syncope and collapse: Secondary | ICD-10-CM | POA: Diagnosis not present

## 2019-05-06 DIAGNOSIS — I428 Other cardiomyopathies: Secondary | ICD-10-CM

## 2019-05-08 ENCOUNTER — Telehealth: Payer: Self-pay

## 2019-05-08 DIAGNOSIS — C44519 Basal cell carcinoma of skin of other part of trunk: Secondary | ICD-10-CM | POA: Diagnosis not present

## 2019-05-08 NOTE — Telephone Encounter (Signed)
Left message for patient to remind of missed remote transmission. LL 

## 2019-05-10 LAB — CUP PACEART REMOTE DEVICE CHECK
Battery Remaining Longevity: 102 mo
Battery Remaining Percentage: 100 %
Brady Statistic RA Percent Paced: 33 %
Brady Statistic RV Percent Paced: 96 %
Date Time Interrogation Session: 20201113001200
Implantable Lead Implant Date: 20170106
Implantable Lead Implant Date: 20170106
Implantable Lead Implant Date: 20170106
Implantable Lead Location: 753858
Implantable Lead Location: 753859
Implantable Lead Location: 753860
Implantable Lead Model: 4677
Implantable Lead Model: 7741
Implantable Lead Model: 7742
Implantable Lead Serial Number: 507382
Implantable Lead Serial Number: 695539
Implantable Lead Serial Number: 724660
Implantable Pulse Generator Implant Date: 20170106
Lead Channel Impedance Value: 633 Ohm
Lead Channel Impedance Value: 645 Ohm
Lead Channel Impedance Value: 725 Ohm
Lead Channel Pacing Threshold Amplitude: 0.8 V
Lead Channel Pacing Threshold Amplitude: 1.3 V
Lead Channel Pacing Threshold Pulse Width: 0.4 ms
Lead Channel Pacing Threshold Pulse Width: 0.4 ms
Lead Channel Setting Pacing Amplitude: 2 V
Lead Channel Setting Pacing Amplitude: 2 V
Lead Channel Setting Pacing Amplitude: 2.2 V
Lead Channel Setting Pacing Pulse Width: 0.4 ms
Lead Channel Setting Pacing Pulse Width: 0.4 ms
Lead Channel Setting Sensing Sensitivity: 2.5 mV
Lead Channel Setting Sensing Sensitivity: 2.5 mV
Pulse Gen Serial Number: 714587

## 2019-06-01 NOTE — Progress Notes (Signed)
Remote pacemaker transmission.   

## 2019-06-06 DIAGNOSIS — C44319 Basal cell carcinoma of skin of other parts of face: Secondary | ICD-10-CM | POA: Diagnosis not present

## 2019-07-29 DIAGNOSIS — N183 Chronic kidney disease, stage 3 unspecified: Secondary | ICD-10-CM | POA: Diagnosis not present

## 2019-07-29 DIAGNOSIS — Z79899 Other long term (current) drug therapy: Secondary | ICD-10-CM | POA: Diagnosis not present

## 2019-07-29 DIAGNOSIS — I5022 Chronic systolic (congestive) heart failure: Secondary | ICD-10-CM | POA: Diagnosis not present

## 2019-08-01 DIAGNOSIS — D509 Iron deficiency anemia, unspecified: Secondary | ICD-10-CM | POA: Diagnosis not present

## 2019-08-01 DIAGNOSIS — I5022 Chronic systolic (congestive) heart failure: Secondary | ICD-10-CM | POA: Diagnosis not present

## 2019-08-01 DIAGNOSIS — N183 Chronic kidney disease, stage 3 unspecified: Secondary | ICD-10-CM | POA: Diagnosis not present

## 2019-08-01 DIAGNOSIS — E785 Hyperlipidemia, unspecified: Secondary | ICD-10-CM | POA: Diagnosis not present

## 2019-08-05 ENCOUNTER — Ambulatory Visit (INDEPENDENT_AMBULATORY_CARE_PROVIDER_SITE_OTHER): Payer: Medicare Other | Admitting: *Deleted

## 2019-08-05 DIAGNOSIS — R55 Syncope and collapse: Secondary | ICD-10-CM

## 2019-08-05 LAB — CUP PACEART REMOTE DEVICE CHECK
Battery Remaining Longevity: 102 mo
Battery Remaining Percentage: 100 %
Brady Statistic RA Percent Paced: 33 %
Brady Statistic RV Percent Paced: 96 %
Date Time Interrogation Session: 20210208094100
Implantable Lead Implant Date: 20170106
Implantable Lead Implant Date: 20170106
Implantable Lead Implant Date: 20170106
Implantable Lead Location: 753858
Implantable Lead Location: 753859
Implantable Lead Location: 753860
Implantable Lead Model: 4677
Implantable Lead Model: 7741
Implantable Lead Model: 7742
Implantable Lead Serial Number: 507382
Implantable Lead Serial Number: 695539
Implantable Lead Serial Number: 724660
Implantable Pulse Generator Implant Date: 20170106
Lead Channel Impedance Value: 588 Ohm
Lead Channel Impedance Value: 610 Ohm
Lead Channel Impedance Value: 643 Ohm
Lead Channel Pacing Threshold Amplitude: 0.8 V
Lead Channel Pacing Threshold Amplitude: 1.3 V
Lead Channel Pacing Threshold Pulse Width: 0.4 ms
Lead Channel Pacing Threshold Pulse Width: 0.4 ms
Lead Channel Setting Pacing Amplitude: 2 V
Lead Channel Setting Pacing Amplitude: 2 V
Lead Channel Setting Pacing Amplitude: 2.2 V
Lead Channel Setting Pacing Pulse Width: 0.4 ms
Lead Channel Setting Pacing Pulse Width: 0.4 ms
Lead Channel Setting Sensing Sensitivity: 2.5 mV
Lead Channel Setting Sensing Sensitivity: 2.5 mV
Pulse Gen Serial Number: 714587

## 2019-08-05 NOTE — Progress Notes (Signed)
PPM Remote  

## 2019-10-07 DIAGNOSIS — C44529 Squamous cell carcinoma of skin of other part of trunk: Secondary | ICD-10-CM | POA: Diagnosis not present

## 2019-10-07 DIAGNOSIS — Z85828 Personal history of other malignant neoplasm of skin: Secondary | ICD-10-CM | POA: Diagnosis not present

## 2019-10-07 DIAGNOSIS — D485 Neoplasm of uncertain behavior of skin: Secondary | ICD-10-CM | POA: Diagnosis not present

## 2019-10-07 DIAGNOSIS — L905 Scar conditions and fibrosis of skin: Secondary | ICD-10-CM | POA: Diagnosis not present

## 2019-10-07 DIAGNOSIS — L570 Actinic keratosis: Secondary | ICD-10-CM | POA: Diagnosis not present

## 2019-10-23 DIAGNOSIS — C44529 Squamous cell carcinoma of skin of other part of trunk: Secondary | ICD-10-CM | POA: Diagnosis not present

## 2019-11-04 ENCOUNTER — Ambulatory Visit (INDEPENDENT_AMBULATORY_CARE_PROVIDER_SITE_OTHER): Payer: Medicare Other | Admitting: *Deleted

## 2019-11-04 DIAGNOSIS — I428 Other cardiomyopathies: Secondary | ICD-10-CM | POA: Diagnosis not present

## 2019-11-05 LAB — CUP PACEART REMOTE DEVICE CHECK
Battery Remaining Longevity: 96 mo
Battery Remaining Percentage: 100 %
Brady Statistic RA Percent Paced: 33 %
Brady Statistic RV Percent Paced: 96 %
Date Time Interrogation Session: 20210510162700
Implantable Lead Implant Date: 20170106
Implantable Lead Implant Date: 20170106
Implantable Lead Implant Date: 20170106
Implantable Lead Location: 753858
Implantable Lead Location: 753859
Implantable Lead Location: 753860
Implantable Lead Model: 4677
Implantable Lead Model: 7741
Implantable Lead Model: 7742
Implantable Lead Serial Number: 507382
Implantable Lead Serial Number: 695539
Implantable Lead Serial Number: 724660
Implantable Pulse Generator Implant Date: 20170106
Lead Channel Impedance Value: 643 Ohm
Lead Channel Impedance Value: 675 Ohm
Lead Channel Impedance Value: 753 Ohm
Lead Channel Pacing Threshold Amplitude: 0.7 V
Lead Channel Pacing Threshold Amplitude: 1.3 V
Lead Channel Pacing Threshold Pulse Width: 0.4 ms
Lead Channel Pacing Threshold Pulse Width: 0.4 ms
Lead Channel Setting Pacing Amplitude: 2 V
Lead Channel Setting Pacing Amplitude: 2 V
Lead Channel Setting Pacing Amplitude: 2.2 V
Lead Channel Setting Pacing Pulse Width: 0.4 ms
Lead Channel Setting Pacing Pulse Width: 0.4 ms
Lead Channel Setting Sensing Sensitivity: 2.5 mV
Lead Channel Setting Sensing Sensitivity: 2.5 mV
Pulse Gen Serial Number: 714587

## 2019-11-05 NOTE — Progress Notes (Signed)
Remote pacemaker transmission.   

## 2019-11-06 ENCOUNTER — Other Ambulatory Visit: Payer: Self-pay | Admitting: *Deleted

## 2019-11-06 DIAGNOSIS — I6523 Occlusion and stenosis of bilateral carotid arteries: Secondary | ICD-10-CM

## 2019-11-08 ENCOUNTER — Telehealth (HOSPITAL_COMMUNITY): Payer: Self-pay

## 2019-11-08 NOTE — Telephone Encounter (Signed)

## 2019-11-11 ENCOUNTER — Inpatient Hospital Stay (HOSPITAL_COMMUNITY): Admission: RE | Admit: 2019-11-11 | Payer: Medicare Other | Source: Ambulatory Visit

## 2019-11-11 ENCOUNTER — Ambulatory Visit: Payer: Medicare Other

## 2019-11-13 ENCOUNTER — Telehealth (HOSPITAL_COMMUNITY): Payer: Self-pay

## 2019-11-13 NOTE — Telephone Encounter (Signed)

## 2019-11-14 ENCOUNTER — Ambulatory Visit (HOSPITAL_COMMUNITY)
Admission: RE | Admit: 2019-11-14 | Discharge: 2019-11-14 | Disposition: A | Discharge status: Home / Self Care | Payer: Medicare Other | Source: Ambulatory Visit | Attending: Vascular Surgery | Admitting: Vascular Surgery

## 2019-11-14 ENCOUNTER — Other Ambulatory Visit: Payer: Self-pay

## 2019-11-14 ENCOUNTER — Encounter: Payer: Self-pay | Admitting: Physician Assistant

## 2019-11-14 ENCOUNTER — Ambulatory Visit (INDEPENDENT_AMBULATORY_CARE_PROVIDER_SITE_OTHER): Payer: Medicare Other | Admitting: Physician Assistant

## 2019-11-14 VITALS — BP 149/77 | HR 75 | Temp 97.9°F | Resp 16 | Ht 73.0 in | Wt 179.0 lb

## 2019-11-14 DIAGNOSIS — I6523 Occlusion and stenosis of bilateral carotid arteries: Secondary | ICD-10-CM

## 2019-11-14 NOTE — Progress Notes (Signed)
Established Carotid Patient   History of Present Illness   Max Cole is a 86 y.o. ([birth year]) male who presents for surveillance of carotid artery stenosis.  Since last office visit he denies any diagnosis of CVA or TIA.  He also denies any strokelike symptoms including slurring speech, changes in vision, or one-sided weakness.  Patient states his left ICA stenosis has been 60 to 79% for over 10 years.  He is on aspirin and a statin.  He is a former smoker.  He is very active and goes to the fitness center almost daily.  He has also followed by his cardiologist for history of heart failure.  He has a pacemaker.   The patient's PMH, PSH, SH, and FamHx were reviewed and are unchanged from prior visit.  Current Outpatient Medications  Medication Sig Dispense Refill  . aspirin 81 MG tablet Take 1 tablet (81 mg total) by mouth daily. 30 tablet   . carvedilol (COREG) 3.125 MG tablet TAKE (1) TABLET BY MOUTH TWICE DAILY WITH A MEAL. 180 tablet 3  . pravastatin (PRAVACHOL) 20 MG tablet Take 1 tablet (20 mg total) by mouth every evening. 90 tablet 3   No current facility-administered medications for this visit.    REVIEW OF SYSTEMS (negative unless checked):   Cardiac:  []  Chest pain or chest pressure? []  Shortness of breath upon activity? []  Shortness of breath when lying flat? []  Irregular heart rhythm?  Vascular:  []  Pain in calf, thigh, or hip brought on by walking? []  Pain in feet at night that wakes you up from your sleep? []  Blood clot in your veins? []  Leg swelling?  Pulmonary:  []  Oxygen at home? []  Productive cough? []  Wheezing?  Neurologic:  []  Sudden weakness in arms or legs? []  Sudden numbness in arms or legs? []  Sudden onset of difficult speaking or slurred speech? []  Temporary loss of vision in one eye? []  Problems with dizziness?  Gastrointestinal:  []  Blood in stool? []  Vomited blood?  Genitourinary:  []  Burning when urinating? []  Blood in  urine?  Psychiatric:  []  Major depression  Hematologic:  []  Bleeding problems? []  Problems with blood clotting?  Dermatologic:  []  Rashes or ulcers?  Constitutional:  []  Fever or chills?  Ear/Nose/Throat:  []  Change in hearing? []  Nose bleeds? []  Sore throat?  Musculoskeletal:  []  Back pain? []  Joint pain? []  Muscle pain?   Physical Examination   Vitals:   11/14/19 1525  BP: (!) 149/77  Pulse: 75  Resp: 16  Temp: 97.9 F (36.6 C)  TempSrc: Temporal  SpO2: 94%  Weight: 179 lb (81.2 kg)  Height: 6\' 1"  (1.854 m)   Body mass index is 23.62 kg/m.  General:  WDWN in NAD; vital signs documented above Gait: Not observed HENT: WNL, normocephalic Pulmonary: normal non-labored breathing , without Rales, rhonchi,  wheezing Cardiac: regular HR Abdomen: soft, NT, no masses Skin: without rashes Extremities: without ischemic changes, without Gangrene , without cellulitis; without open wounds;  Musculoskeletal: no muscle wasting or atrophy  Neurologic: A&O X 3;  CN grossly intact Psychiatric:  The pt has Normal affect.  Non-Invasive Vascular Imaging   B Carotid Duplex:   R ICA stenosis:  1-39%  R VA:  patent and antegrade  L ICA stenosis:  60-79%  L VA:  patent and antegrade   Medical Decision Making   Max Cole is a 86 y.o. male who presents for surveillance of carotid artery stenosis   Carotid  duplex is unchanged since last office visit  Right ICA 1 to 39% stenosis and left ICA 60 to 79% stenosis  I recommended a repeat carotid duplex in 6 months; patient explains that since left ICA stenosis has been unchanged over 10 years, he believes this should be an annual follow-up.  He is however agreeable to a 28-month interval.  Continue aspirin and statin daily  We will recheck carotid duplex in 9 months   Dagoberto Ligas PA-C [facility] Office: [facility] MD: Oneida Alar

## 2019-11-18 ENCOUNTER — Other Ambulatory Visit: Payer: Self-pay | Admitting: *Deleted

## 2019-11-18 DIAGNOSIS — I6523 Occlusion and stenosis of bilateral carotid arteries: Secondary | ICD-10-CM

## 2020-01-21 ENCOUNTER — Other Ambulatory Visit: Payer: Self-pay | Admitting: Internal Medicine

## 2020-02-03 ENCOUNTER — Ambulatory Visit (INDEPENDENT_AMBULATORY_CARE_PROVIDER_SITE_OTHER): Payer: Medicare Other | Admitting: *Deleted

## 2020-02-03 DIAGNOSIS — I428 Other cardiomyopathies: Secondary | ICD-10-CM | POA: Diagnosis not present

## 2020-02-03 LAB — CUP PACEART REMOTE DEVICE CHECK
Battery Remaining Longevity: 90 mo
Battery Remaining Percentage: 100 %
Brady Statistic RA Percent Paced: 32 %
Brady Statistic RV Percent Paced: 96 %
Date Time Interrogation Session: 20210809003000
Implantable Lead Implant Date: 20170106
Implantable Lead Implant Date: 20170106
Implantable Lead Implant Date: 20170106
Implantable Lead Location: 753858
Implantable Lead Location: 753859
Implantable Lead Location: 753860
Implantable Lead Model: 4677
Implantable Lead Model: 7741
Implantable Lead Model: 7742
Implantable Lead Serial Number: 507382
Implantable Lead Serial Number: 695539
Implantable Lead Serial Number: 724660
Implantable Pulse Generator Implant Date: 20170106
Lead Channel Impedance Value: 630 Ohm
Lead Channel Impedance Value: 649 Ohm
Lead Channel Impedance Value: 656 Ohm
Lead Channel Pacing Threshold Amplitude: 1 V
Lead Channel Pacing Threshold Amplitude: 1.3 V
Lead Channel Pacing Threshold Pulse Width: 0.4 ms
Lead Channel Pacing Threshold Pulse Width: 0.4 ms
Lead Channel Setting Pacing Amplitude: 2 V
Lead Channel Setting Pacing Amplitude: 2 V
Lead Channel Setting Pacing Amplitude: 2.2 V
Lead Channel Setting Pacing Pulse Width: 0.4 ms
Lead Channel Setting Pacing Pulse Width: 0.4 ms
Lead Channel Setting Sensing Sensitivity: 2.5 mV
Lead Channel Setting Sensing Sensitivity: 2.5 mV
Pulse Gen Serial Number: 714587

## 2020-02-04 NOTE — Progress Notes (Signed)
Remote pacemaker transmission.   

## 2020-02-06 ENCOUNTER — Observation Stay (HOSPITAL_COMMUNITY)
Admission: EM | Admit: 2020-02-06 | Discharge: 2020-02-07 | Disposition: A | Discharge status: Home / Self Care | Payer: Medicare Other | Attending: Internal Medicine | Admitting: Internal Medicine

## 2020-02-06 ENCOUNTER — Encounter (HOSPITAL_COMMUNITY): Payer: Self-pay

## 2020-02-06 ENCOUNTER — Other Ambulatory Visit: Payer: Self-pay

## 2020-02-06 DIAGNOSIS — Z79899 Other long term (current) drug therapy: Secondary | ICD-10-CM | POA: Diagnosis not present

## 2020-02-06 DIAGNOSIS — N1832 Chronic kidney disease, stage 3b: Secondary | ICD-10-CM

## 2020-02-06 DIAGNOSIS — Z20822 Contact with and (suspected) exposure to covid-19: Secondary | ICD-10-CM | POA: Insufficient documentation

## 2020-02-06 DIAGNOSIS — I4891 Unspecified atrial fibrillation: Secondary | ICD-10-CM

## 2020-02-06 DIAGNOSIS — R55 Syncope and collapse: Principal | ICD-10-CM | POA: Insufficient documentation

## 2020-02-06 DIAGNOSIS — N183 Chronic kidney disease, stage 3 unspecified: Secondary | ICD-10-CM | POA: Insufficient documentation

## 2020-02-06 DIAGNOSIS — Z8546 Personal history of malignant neoplasm of prostate: Secondary | ICD-10-CM | POA: Diagnosis not present

## 2020-02-06 DIAGNOSIS — I5042 Chronic combined systolic (congestive) and diastolic (congestive) heart failure: Secondary | ICD-10-CM

## 2020-02-06 DIAGNOSIS — Z87891 Personal history of nicotine dependence: Secondary | ICD-10-CM | POA: Insufficient documentation

## 2020-02-06 DIAGNOSIS — Z7982 Long term (current) use of aspirin: Secondary | ICD-10-CM | POA: Diagnosis not present

## 2020-02-06 DIAGNOSIS — I428 Other cardiomyopathies: Secondary | ICD-10-CM

## 2020-02-06 LAB — CBC WITH DIFFERENTIAL/PLATELET
Abs Immature Granulocytes: 0.03 10*3/uL (ref 0.00–0.07)
Basophils Absolute: 0.1 10*3/uL (ref 0.0–0.1)
Basophils Relative: 1 %
Eosinophils Absolute: 0.2 10*3/uL (ref 0.0–0.5)
Eosinophils Relative: 2 %
HCT: 37.9 % — ABNORMAL LOW (ref 39.0–52.0)
Hemoglobin: 12.3 g/dL — ABNORMAL LOW (ref 13.0–17.0)
Immature Granulocytes: 0 %
Lymphocytes Relative: 20 %
Lymphs Abs: 1.8 10*3/uL (ref 0.7–4.0)
MCH: 32.2 pg (ref 26.0–34.0)
MCHC: 32.5 g/dL (ref 30.0–36.0)
MCV: 99.2 fL (ref 80.0–100.0)
Monocytes Absolute: 1 10*3/uL (ref 0.1–1.0)
Monocytes Relative: 11 %
Neutro Abs: 5.7 10*3/uL (ref 1.7–7.7)
Neutrophils Relative %: 66 %
Platelets: 303 10*3/uL (ref 150–400)
RBC: 3.82 MIL/uL — ABNORMAL LOW (ref 4.22–5.81)
RDW: 12.2 % (ref 11.5–15.5)
WBC: 8.8 10*3/uL (ref 4.0–10.5)
nRBC: 0 % (ref 0.0–0.2)

## 2020-02-06 LAB — TROPONIN I (HIGH SENSITIVITY)
Troponin I (High Sensitivity): 8 ng/L (ref <18)
Troponin I (High Sensitivity): 11 ng/L (ref <18)

## 2020-02-06 LAB — BASIC METABOLIC PANEL
Anion gap: 11 (ref 5–15)
BUN: 27 mg/dL — ABNORMAL HIGH (ref 8–23)
CO2: 22 mmol/L (ref 22–32)
Calcium: 8.5 mg/dL — ABNORMAL LOW (ref 8.9–10.3)
Chloride: 102 mmol/L (ref 98–111)
Creatinine, Ser: 1.67 mg/dL — ABNORMAL HIGH (ref 0.61–1.24)
GFR calc Af Amer: 41 mL/min — ABNORMAL LOW (ref >60)
GFR calc non Af Amer: 35 mL/min — ABNORMAL LOW (ref >60)
Glucose, Bld: 125 mg/dL — ABNORMAL HIGH (ref 70–99)
Potassium: 4.2 mmol/L (ref 3.5–5.1)
Sodium: 135 mmol/L (ref 135–145)

## 2020-02-06 LAB — MAGNESIUM: Magnesium: 1.9 mg/dL (ref 1.7–2.4)

## 2020-02-06 LAB — SARS CORONAVIRUS 2 BY RT PCR (HOSPITAL ORDER, PERFORMED IN ~~LOC~~ HOSPITAL LAB): SARS Coronavirus 2: NEGATIVE

## 2020-02-06 LAB — TSH: TSH: 1.925 u[IU]/mL (ref 0.350–4.500)

## 2020-02-06 MED ORDER — APIXABAN 2.5 MG PO TABS
2.5000 mg | ORAL_TABLET | Freq: Two times a day (BID) | ORAL | Status: DC
  Administered 2020-02-06 – 2020-02-07 (×2): 2.5 mg via ORAL
  Filled 2020-02-06 (×2): qty 1

## 2020-02-06 MED ORDER — AMIODARONE IV BOLUS ONLY 150 MG/100ML
150.0000 mg | Freq: Once | INTRAVENOUS | Status: AC
  Administered 2020-02-06: 150 mg via INTRAVENOUS

## 2020-02-06 MED ORDER — CHLORHEXIDINE GLUCONATE CLOTH 2 % EX PADS
6.0000 | MEDICATED_PAD | Freq: Every day | CUTANEOUS | Status: DC
  Administered 2020-02-07: 6 via TOPICAL

## 2020-02-06 MED ORDER — CARVEDILOL 3.125 MG PO TABS
3.1250 mg | ORAL_TABLET | Freq: Every day | ORAL | Status: DC
  Administered 2020-02-07: 3.125 mg via ORAL
  Filled 2020-02-06: qty 1

## 2020-02-06 MED ORDER — PRAVASTATIN SODIUM 10 MG PO TABS
20.0000 mg | ORAL_TABLET | Freq: Every day | ORAL | Status: DC
  Administered 2020-02-07: 20 mg via ORAL
  Filled 2020-02-06: qty 2

## 2020-02-06 MED ORDER — ASPIRIN EC 81 MG PO TBEC
81.0000 mg | DELAYED_RELEASE_TABLET | Freq: Every day | ORAL | Status: DC
  Administered 2020-02-07: 81 mg via ORAL
  Filled 2020-02-06: qty 1

## 2020-02-06 MED ORDER — ENOXAPARIN SODIUM 40 MG/0.4ML ~~LOC~~ SOLN: 40.0000 mg | SUBCUTANEOUS | Status: DC

## 2020-02-06 MED ORDER — FERROUS SULFATE 325 (65 FE) MG PO TABS
325.0000 mg | ORAL_TABLET | Freq: Every day | ORAL | Status: DC
  Administered 2020-02-07: 325 mg via ORAL
  Filled 2020-02-06: qty 1

## 2020-02-06 MED ORDER — AMIODARONE HCL IN DEXTROSE 360-4.14 MG/200ML-% IV SOLN
60.0000 mg/h | INTRAVENOUS | Status: AC
  Administered 2020-02-06 (×2): 60 mg/h via INTRAVENOUS
  Filled 2020-02-06 (×2): qty 200

## 2020-02-06 MED ORDER — AMIODARONE HCL 150 MG/3ML IV SOLN
150.0000 mg | Freq: Once | INTRAVENOUS | Status: DC
  Filled 2020-02-06: qty 3

## 2020-02-06 MED ORDER — ASPIRIN 81 MG PO CHEW
324.0000 mg | CHEWABLE_TABLET | Freq: Once | ORAL | Status: AC
  Administered 2020-02-06: 324 mg via ORAL
  Filled 2020-02-06: qty 4

## 2020-02-06 MED ORDER — AMIODARONE HCL IN DEXTROSE 360-4.14 MG/200ML-% IV SOLN
30.0000 mg/h | INTRAVENOUS | Status: DC
  Filled 2020-02-06: qty 200

## 2020-02-06 NOTE — H&P (Signed)
History and Physical  Max Cole BHA:[id number] DOB: [birth year] DOA: 02/06/2020   PCP: Asencion Noble, MD   Patient coming from: Home  Chief Complaint: near syncope  HPI:  Max Cole is a 86 y.o. male with medical history of systolic CHF status post BiV PPM, CKD stage III, carotid stenosis, hyperlipidemia, and hypertension presenting syncopal episode.  He was walking earlier in the morning on 1221.  The patient was walking and jogging on his treadmill and was feeling fine without any chest discomfort or shortness of breath.  Approximately 3 minutes after exercising, patient was talking to his friend for an ER nurse and with episode with dizziness.  He denied any chest discomfort, shortness breath, nausea, vomiting, diaphoresis.  Patient states that he routinely exercises without any difficulty with increasing dyspnea or chest discomfort recently.  He denies any fevers, chills, nausea, vomiting or direct abdominal pain.  Denies any new medications. In the emergency department, afebrile hemodynamically stable with oxygen saturation 100% on room air.  Initially the patient was noted to have a wide-complex tachycardia with a heart rate in the 150s.  Cardiology was consulted patient atrial fibrillation with aberrancy after reviewed EKG.  Patient was started on amiodarone drip.  He spontaneously converted to sinus rhythm.  Patient will be admitted for further evaluation of his atrial fibrillation.  Assessment/Plan: Paroxysmal Atrial fibrillation with RVR -CHADSVASc = 5 -Patient wants to defer anticoagulation until he speaks with a cardiologist -Spontaneously converted to sinus rhythm after amiodarone bolus -Continue amiodarone drip -Cardiology consult -TSH 1.95 -Echocardiogram  CKD stage IIIb -Baseline creatinine 1.6-1.7 -A.m. BMP  Chronic systolic and diastolic CHF -Clinically euvolemic -05/21/2015 echo EF 20 to 25%, grade 1 DD -Daily weights  Left carotid stenosis -Continue  aspirin  Hyperlipidemia -Continue statin       Past Medical History:  Diagnosis Date  . Atrial fibrillation ([place])   . Bradycardia   . Cardiomyopathy 2005   a. Possibly alcoholic; b. cath in 9735-32% ostial D1, PCW of 12, EF of 35-40%;  c. EF of 0.25 in 11/2003;  d. 40-45% in 05/2004;  e. 25% in 10/2008 by echo;  f. 04/2015 Echo: EF 40-45%, diff HK, sev inflat/inf HK, Gr 1 DD, mild AI, triv TR.  . Carotid artery occlusion   . Cerebrovascular disease    Carotid ultrasound in 12/2010-60-79% left internal carotid artery, less than 40% or right RICA, no change  . Chronic kidney disease    Creatinine of 1.6 in 2009  . Hyperlipidemia 10/15/2011   Lipid profile in 03/2009:231, 136, 56, 148  . Left bundle branch block   . Prostate carcinoma ([place])   . Syncope    [place] CRT-P 07/03/15 Dr. Lovena Le   Past Surgical History:  Procedure Laterality Date  . CATARACT EXTRACTION     Right  . COLONOSCOPY N/A 08/16/2012   Procedure: COLONOSCOPY;  Surgeon: Rogene Houston, MD;  [facility];  Service: Endoscopy;  Laterality: N/A;  930  . COLONOSCOPY N/A 10/21/2015   Procedure: COLONOSCOPY;  Surgeon: Rogene Houston, MD;  [facility];  Service: Endoscopy;  Laterality: N/A;  1200  . COLONOSCOPY W/ POLYPECTOMY  2010   Diverticulosis  . EP IMPLANTABLE DEVICE N/A 07/03/2015   Procedure: BiV Pacemaker Insertion CRT-P;  Surgeon: Evans Lance, MD;  Location: [place] [facility] LAB;  Service: Cardiovascular;  Laterality: N/A;  . EYE SURGERY Right    Cataract  . HERNIA REPAIR  Social History:  reports that he quit smoking about 46 years ago. He has a 30.00 pack-year smoking history. He has never used smokeless tobacco. He reports current alcohol use of about 5.0 standard drinks of alcohol per week. He reports that he does not use drugs.   Family History  Problem Relation Age of Onset  . Hypertension Mother      No Known Allergies   Prior to Admission medications   Medication Sig  Start Date End Date Taking? Authorizing Provider  aspirin 81 MG tablet Take 1 tablet (81 mg total) by mouth daily. 10/28/15  Yes Rehman, Mechele Dawley, MD  carvedilol (COREG) 3.125 MG tablet TAKE (1) TABLET BY MOUTH TWICE DAILY WITH A MEAL. Patient taking differently: Take 3.125 mg by mouth daily. TAKE (1) TABLET BY MOUTH TWICE DAILY WITH A MEAL. 11/05/18  Yes Evans Lance, MD  ferrous sulfate 325 (65 FE) MG tablet Take 325 mg by mouth daily with breakfast.   Yes [provider]  pravastatin (PRAVACHOL) 20 MG tablet Take 1 tablet (20 mg total) by mouth every evening. Must make appt for further refills [phone] Patient taking differently: Take 20 mg by mouth every morning. Must make appt for further refills [phone] 01/21/20  Yes Evans Lance, MD    Review of Systems:  Constitutional:  No weight loss, night sweats, Fevers, chills, fatigue.  Head&Eyes: No headache.  No vision loss.  No eye pain or scotoma ENT:  No Difficulty swallowing,Tooth/dental problems,Sore throat,  No ear ache, post nasal drip,  Cardio-vascular:  No chest pain, Orthopnea, PND, swelling in lower extremities,  dizziness, palpitations  GI:  No  abdominal pain, nausea, vomiting, diarrhea, loss of appetite, hematochezia, melena, heartburn, indigestion, Resp:  No shortness of breath with exertion or at rest. No cough. No coughing up of blood .No wheezing.No chest wall deformity  Skin:  no rash or lesions.  GU:  no dysuria, change in color of urine, no urgency or frequency. No flank pain.  Musculoskeletal:  No joint pain or swelling. No decreased range of motion. No back pain.  Psych:  No change in mood or affect. No depression or anxiety. Neurologic: No headache, no dysesthesia, no focal weakness, no vision loss  Physical Exam: Vitals:   02/06/20 1114 02/06/20 1130 02/06/20 1200 02/06/20 1230  BP: 135/79 128/71 112/71 132/78  Pulse: 80 72 64 65  Resp: 20 16 16  (!) 21  Temp:      TempSrc:       SpO2: 95% 92% 96% 98%  Weight:      Height:       General:  A&O x 3, NAD, nontoxic, pleasant/cooperative Head/Eye: No conjunctival hemorrhage, no icterus, [place]/AT, No nystagmus ENT:  No icterus,  No thrush, good dentition, no pharyngeal exudate Neck:  No masses, no lymphadenpathy, no bruits CV:  RRR, no rub, no gallop, no S3 Lung:  CTAB, good air movement, no wheeze, no rhonchi Abdomen: soft/NT, +BS, nondistended, no peritoneal signs Ext: No cyanosis, No rashes, No petechiae, No lymphangitis, No edema Neuro: CNII-XII intact, strength 4/5 in bilateral upper and lower extremities, no dysmetria  Labs on Admission:  Basic Metabolic Panel: Recent Labs  Lab 02/06/20 1019  NA 135  K 4.2  CL 102  CO2 22  GLUCOSE 125*  BUN 27*  CREATININE 1.67*  CALCIUM 8.5*  MG 1.9   Liver Function Tests: No results for input(s): AST, ALT, ALKPHOS, BILITOT, PROT, ALBUMIN in the last 168 hours. No results for  input(s): LIPASE, AMYLASE in the last 168 hours. No results for input(s): AMMONIA in the last 168 hours. CBC: Recent Labs  Lab 02/06/20 1019  WBC 8.8  NEUTROABS 5.7  HGB 12.3*  HCT 37.9*  MCV 99.2  PLT 303   Coagulation Profile: No results for input(s): INR, PROTIME in the last 168 hours. Cardiac Enzymes: No results for input(s): CKTOTAL, CKMB, CKMBINDEX, TROPONINI in the last 168 hours. BNP: Invalid input(s): POCBNP CBG: No results for input(s): GLUCAP in the last 168 hours. Urine analysis:    Component Value Date/Time   LABSPEC 1.018 02/26/2009 0000   PHURINE 6.0 02/26/2009 0000   GLUCOSEU neg 02/26/2009 0000   PROTEINUR neg 02/26/2009 0000   NITRITE neg 02/26/2009 0000   Sepsis Labs: @LABRCNTIP (procalcitonin:4,lacticidven:4) )No results found for this or any previous visit (from the past 240 hour(s)).   Radiological Exams on Admission: No results found.  EKG: Independently reviewed. Wide complex tachycardia    Time spent:60 minutes Code Status:   FULL Family  Communication:  daughter at bedside 8/12 Disposition Plan: expect 1 day hospitalization Consults called: cardiology DVT Prophylaxis: [place] Lovenox  Orson Eva, DO  [facility] Pager [phone]  If 7PM-7AM, please contact night-coverage [email or web address] Password [facility] 02/06/2020, 12:46 PM

## 2020-02-06 NOTE — ED Provider Notes (Signed)
[facility] Emergency Department Provider Note MRN:  [id number]  Arrival date & time: 02/06/20     Chief Complaint   Near Syncope   History of Present Illness   Max Cole is a 86 y.o. year-old male with a history of atrial fibrillation presenting to the ED with chief complaint of near syncope.  Patient was exercising and when he was finishing up and cooling down he suddenly became lightheaded and then he felt some pressure on the chest, thinks he passed out for a few moments, did not fall or hurt himself.  Currently feeling back to normal.  He felt his heart racing but not currently.  Denies any shortness of breath, no headache or vision change, no abdominal pain, no numbness or weakness to the arms or legs.  Review of Systems  A complete 10 system review of systems was obtained and all systems are negative except as noted in the HPI and PMH.   Patient's Health History    Past Medical History:  Diagnosis Date  . Atrial fibrillation ([place])   . Bradycardia   . Cardiomyopathy 2005   a. Possibly alcoholic; b. cath in 9562-13% ostial D1, PCW of 12, EF of 35-40%;  c. EF of 0.25 in 11/2003;  d. 40-45% in 05/2004;  e. 25% in 10/2008 by echo;  f. 04/2015 Echo: EF 40-45%, diff HK, sev inflat/inf HK, Gr 1 DD, mild AI, triv TR.  . Carotid artery occlusion   . Cerebrovascular disease    Carotid ultrasound in 12/2010-60-79% left internal carotid artery, less than 40% or right RICA, no change  . Chronic kidney disease    Creatinine of 1.6 in 2009  . Hyperlipidemia 10/15/2011   Lipid profile in 03/2009:231, 136, 56, 148  . Left bundle branch block   . Prostate carcinoma ([place])   . Syncope    [place] CRT-P 07/03/15 Dr. Lovena Le    Past Surgical History:  Procedure Laterality Date  . CATARACT EXTRACTION     Right  . COLONOSCOPY N/A 08/16/2012   Procedure: COLONOSCOPY;  Surgeon: Rogene Houston, MD;  [facility];  Service: Endoscopy;  Laterality: N/A;  930  .  COLONOSCOPY N/A 10/21/2015   Procedure: COLONOSCOPY;  Surgeon: Rogene Houston, MD;  [facility];  Service: Endoscopy;  Laterality: N/A;  1200  . COLONOSCOPY W/ POLYPECTOMY  2010   Diverticulosis  . EP IMPLANTABLE DEVICE N/A 07/03/2015   Procedure: BiV Pacemaker Insertion CRT-P;  Surgeon: Evans Lance, MD;  Location: [place] [facility];  Service: Cardiovascular;  Laterality: N/A;  . EYE SURGERY Right    Cataract  . HERNIA REPAIR      Family History  Problem Relation Age of Onset  . Hypertension Mother     Social History   Socioeconomic History  . Marital status: Married    Spouse name: Not on file  . Number of children: Not on file  . Years of education: Not on file  . Highest education level: Not on file  Occupational History  . Not on file  Tobacco Use  . Smoking status: Former Smoker    Packs/day: 1.00    Years: 30.00    Pack years: 30.00    Quit date: 09/28/1973    Years since quitting: 46.3  . Smokeless tobacco: Never Used  Vaping Use  . Vaping Use: Never used  Substance and Sexual Activity  . Alcohol use: Yes    Alcohol/week: 5.0 standard drinks  Types: 5 Glasses of wine per week    Comment: History of excessive alcohol use; Alternates between glass of wine vs. Liquor drink 5 days per week (04/2015)  . Drug use: No  . Sexual activity: Not on file  Other Topics Concern  . Not on file  Social History Narrative  . Not on file   Social Determinants of Health   Financial Resource Strain:   . Difficulty of Paying Living Expenses:   Food Insecurity:   . Worried About Charity fundraiser in the Last Year:   . Arboriculturist in the Last Year:   Transportation Needs:   . Film/video editor (Medical):   Marland Kitchen Lack of Transportation (Non-Medical):   Physical Activity:   . Days of Exercise per Week:   . Minutes of Exercise per Session:   Stress:   . Feeling of Stress :   Social Connections:   . Frequency of Communication with Friends and Family:   .  Frequency of Social Gatherings with Friends and Family:   . Attends Religious Services:   . Active Member of Clubs or Organizations:   . Attends Archivist Meetings:   Marland Kitchen Marital Status:   Intimate Partner Violence:   . Fear of Current or Ex-Partner:   . Emotionally Abused:   Marland Kitchen Physically Abused:   . Sexually Abused:      Physical Exam   Vitals:   02/06/20 1100 02/06/20 1114  BP: 119/68 135/79  Pulse: 99 80  Resp: 17 20  Temp:    SpO2: 94% 95%    CONSTITUTIONAL: Well-appearing, NAD NEURO:  Alert and oriented x 3, no focal deficits EYES:  eyes equal and reactive ENT/NECK:  no LAD, no JVD CARDIO: Tachycardic rate, well-perfused, normal S1 and S2 PULM:  CTAB no wheezing or rhonchi GI/GU:  normal bowel sounds, non-distended, non-tender MSK/SPINE:  No gross deformities, no edema SKIN:  no rash, atraumatic PSYCH:  Appropriate speech and behavior  *Additional and/or pertinent findings included in MDM below  Diagnostic and Interventional Summary    EKG Interpretation  Date/Time:  Thursday February 06 2020 10:07:48 EDT Ventricular Rate:  166 PR Interval:    QRS Duration: 151 QT Interval:  323 QTC Calculation: 537 R Axis:   167 Text Interpretation: Extreme tachycardia with wide complex, no further rhythm analysis attempted Baseline wander in lead(s) V2 Confirmed by Gerlene Fee [phone]) on 02/06/2020 10:09:16 AM      Labs Reviewed  BASIC METABOLIC PANEL - Abnormal; Notable for the following components:      Result Value   Glucose, Bld 125 (*)    BUN 27 (*)    Creatinine, Ser 1.67 (*)    Calcium 8.5 (*)    GFR calc non Af Amer 35 (*)    GFR calc Af Amer 41 (*)    All other components within normal limits  CBC WITH DIFFERENTIAL/PLATELET - Abnormal; Notable for the following components:   RBC 3.82 (*)    Hemoglobin 12.3 (*)    HCT 37.9 (*)    All other components within normal limits  SARS CORONAVIRUS 2 BY RT PCR (HOSPITAL ORDER, [place] [facility])  MAGNESIUM  TSH  TROPONIN I (HIGH SENSITIVITY)  TROPONIN I (HIGH SENSITIVITY)    No orders to display    Medications  amiodarone (NEXTERONE PREMIX) 360-4.14 MG/200ML-% (1.8 mg/mL) IV infusion (60 mg/hr Intravenous New Bag/Given 02/06/20 1029)  aspirin chewable tablet 324 mg (324 mg Oral  Given 02/06/20 1031)  amiodarone (NEXTERONE) IV bolus only 150 mg/100 mL (0 mg Intravenous Stopped 02/06/20 1040)     Procedures  /  Critical Care .Critical Care Performed by: Maudie Flakes, MD Authorized by: Maudie Flakes, MD   Critical care provider statement:    Critical care time (minutes):  45   Critical care was necessary to treat or prevent imminent or life-threatening deterioration of the following conditions: A. fib with RVR.   Critical care was time spent personally by me on the following activities:  Discussions with consultants, evaluation of patient's response to treatment, examination of patient, ordering and performing treatments and interventions, ordering and review of laboratory studies, ordering and review of radiographic studies, pulse oximetry, re-evaluation of patient's condition, obtaining history from patient or surrogate and review of old charts    ED Course and Medical Decision Making  I have reviewed the triage vital signs, the nursing notes, and pertinent available records from the EMR.  Listed above are laboratory and imaging tests that I personally ordered, reviewed, and interpreted and then considered in my medical decision making (see below for details).  EKG and bedside monitor were concerning for ventricular tachycardia.  Patient does seem to have a history of bundle branch block and so atrial fibrillation or flutter with aberrancy is also possible.  Patient is hemodynamically stable, will load with amiodarone and consult cardiology.  Would be a good candidate for cardioversion.     Patient has converted after 1 to 2 hours on amiodarone drip.  Now with  rates between 60 and 90, sinus rhythm.  Discussed with Dr. Harl Bowie who recommends admission to hospitalist service for observation given the syncope.  Cardiology will follow in consultation.  Barth Kirks. Sedonia Small, MD [place] [email or web address]  Final Clinical Impressions(s) / ED Diagnoses     ICD-10-CM   1. Atrial fibrillation with RVR ([place])  I48.91     ED Discharge Orders    None       Discharge Instructions Discussed with and Provided to Patient:   Discharge Instructions   None       Maudie Flakes, MD 02/06/20 1210

## 2020-02-06 NOTE — ED Notes (Signed)
Interrogated [employer], unable to transmit data due to [employer] being down. Contacted rep and reported would come out and interrogate pacemaker due to unknown time for [employer] restoration.

## 2020-02-06 NOTE — Consult Note (Signed)
Cardiology Consult    Patient ID: Max Cole; [id number]; August 05, 1927   Admit date: 02/06/2020 Date of Consult: 02/06/2020  Primary Care Provider: Asencion Noble, MD Primary Cardiologist: Cristopher Peru, MD   Patient Profile    Max Cole is a 86 y.o. male with past medical history of chronic systolic CHF/presumed NICM (EF 35-40% by echo in 2005 with cath showing nonobstructive CAD, EF at 40-45% by echo in 2016), CHB (s/p [place] CRT-P in 06/2015), LBBB, HTN, HLD, Stage 3 CKD and carotid artery stenosis who is being seen today for the evaluation of wide-complex tachycardia at the request of Dr. Sedonia Small.   History of Present Illness    Max Cole most recently had a phone visit with Dr. Lovena Le in 01/2019 and denied any recent chest pain or dyspnea on exertion at that time.  He had been walking for 3 miles each day without anginal symptoms.  He was continued on his current medication regimen including ASA 81 mg daily, Coreg 3.125mg  BID and Pravastatin 20mg  daily. Unclear why not on ACE-I or ARB, possibly due to underlying CKD.  His device check at that time showed normal device function.  He presented to [facility] ED this morning for evaluation of near syncope right after exercising. Finished running on treadmill, stepped off treadmill. Felt a fullness in his chest and very dizzy, slowly slid down to ground, unclear if full LOC but came to very quickly. In ER found to be in wide complex irregular rhythm hemodynamically stable  Initial labs showed WBC 8.8, Hgb 12.3, platelets 303, Na+ 135, K+ 4.2 and creatinine 1.67 (close to baseline). Mg 1.9. TSH 1.925. COVID pending. Initial HS Troponin negative at 8. EKG shows a wide-complex tachycardia, HR 166 most consistent with afib with RVR   Past Medical History:  Diagnosis Date  . Atrial fibrillation ([place])   . Bradycardia   . Cardiomyopathy 2005   a. Possibly alcoholic; b. cath in 4854-62% ostial D1, PCW of 12, EF of 35-40%;  c. EF of 0.25 in  11/2003;  d. 40-45% in 05/2004;  e. 25% in 10/2008 by echo;  f. 04/2015 Echo: EF 40-45%, diff HK, sev inflat/inf HK, Gr 1 DD, mild AI, triv TR.  . Carotid artery occlusion   . Cerebrovascular disease    Carotid ultrasound in 12/2010-60-79% left internal carotid artery, less than 40% or right RICA, no change  . Chronic kidney disease    Creatinine of 1.6 in 2009  . Hyperlipidemia 10/15/2011   Lipid profile in 03/2009:231, 136, 56, 148  . Left bundle [place] Tingler block   . Prostate carcinoma ([place])   . Syncope    [place] CRT-P 07/03/15 Dr. Lovena Le    Past Surgical History:  Procedure Laterality Date  . CATARACT EXTRACTION     Right  . COLONOSCOPY N/A 08/16/2012   Procedure: COLONOSCOPY;  Surgeon: Rogene Houston, MD;  [facility];  Service: Endoscopy;  Laterality: N/A;  930  . COLONOSCOPY N/A 10/21/2015   Procedure: COLONOSCOPY;  Surgeon: Rogene Houston, MD;  [facility];  Service: Endoscopy;  Laterality: N/A;  1200  . COLONOSCOPY W/ POLYPECTOMY  2010   Diverticulosis  . EP IMPLANTABLE DEVICE N/A 07/03/2015   Procedure: BiV Pacemaker Insertion CRT-P;  Surgeon: Evans Lance, MD;  Location: [place] [facility];  Service: Cardiovascular;  Laterality: N/A;  . EYE SURGERY Right    Cataract  . HERNIA REPAIR       Inpatient Medications:  Scheduled Meds:  Continuous Infusions: . amiodarone 60 mg/hr (02/06/20 1029)   PRN Meds:   Allergies:   No Known Allergies  Social History:   Social History   Socioeconomic History  . Marital status: Married    Spouse name: Not on file  . Number of children: Not on file  . Years of education: Not on file  . Highest education level: Not on file  Occupational History  . Not on file  Tobacco Use  . Smoking status: Former Smoker    Packs/day: 1.00    Years: 30.00    Pack years: 30.00    Quit date: 09/28/1973    Years since quitting: 46.3  . Smokeless tobacco: Never Used  Vaping Use  . Vaping Use: Never used  Substance and Sexual  Activity  . Alcohol use: Yes    Alcohol/week: 5.0 standard drinks    Types: 5 Glasses of wine per week    Comment: History of excessive alcohol use; Alternates between glass of wine vs. Liquor drink 5 days per week (04/2015)  . Drug use: No  . Sexual activity: Not on file  Other Topics Concern  . Not on file  Social History Narrative  . Not on file   Social Determinants of Health   Financial Resource Strain:   . Difficulty of Paying Living Expenses:   Food Insecurity:   . Worried About Charity fundraiser in the Last Year:   . Arboriculturist in the Last Year:   Transportation Needs:   . Film/video editor (Medical):   Marland Kitchen Lack of Transportation (Non-Medical):   Physical Activity:   . Days of Exercise per Week:   . Minutes of Exercise per Session:   Stress:   . Feeling of Stress :   Social Connections:   . Frequency of Communication with Friends and Family:   . Frequency of Social Gatherings with Friends and Family:   . Attends Religious Services:   . Active Member of Clubs or Organizations:   . Attends Archivist Meetings:   Marland Kitchen Marital Status:   Intimate Partner Violence:   . Fear of Current or Ex-Partner:   . Emotionally Abused:   Marland Kitchen Physically Abused:   . Sexually Abused:      Family History:    Family History  Problem Relation Age of Onset  . Hypertension Mother       Review of Systems    General:  No chills, fever, night sweats or weight changes.  Cardiovascular:  No chest pain, dyspnea on exertion, edema, orthopnea, palpitations, paroxysmal nocturnal dyspnea. Dermatological: No rash, lesions/masses Respiratory: No cough, dyspnea Urologic: No hematuria, dysuria Abdominal:   No nausea, vomiting, diarrhea, bright red blood per rectum, melena, or hematemesis Neurologic:  dizziness All other systems reviewed and are otherwise negative except as noted above.  Physical Exam/Data    Vitals:   02/06/20 1114 02/06/20 1130 02/06/20 1200 02/06/20  1230  BP: 135/79 128/71 112/71 132/78  Pulse: 80 72 64 65  Resp: 20 16 16  (!) 21  Temp:      TempSrc:      SpO2: 95% 92% 96% 98%  Weight:      Height:        Intake/Output Summary (Last 24 hours) at 02/06/2020 1256 Last data filed at 02/06/2020 1040 Gross per 24 hour  Intake 77.26 ml  Output --  Net 77.26 ml   Filed Weights   02/06/20 1002  Weight: 79.8 kg  Body mass index is 23.87 kg/m.   General: Pleasant, NAD Psych: Normal affect. Neuro: Alert and oriented X 3. Moves all extremities spontaneously. HEENT: Normal  Neck: Supple without bruits or JVD. Lungs:  Resp regular and unlabored, CTA. Heart: RRR no s3, s4, or murmurs. Abdomen: Soft, non-tender, non-distended, BS + x 4.  Extremities: No clubbing, cyanosis or edema. DP/PT/Radials 2+ and equal bilaterally.    Labs/Studies     Relevant CV Studies:  Echocardiogram: 04/2015 Study Conclusions   - Left ventricle: The cavity size was normal. There was moderate  concentric hypertrophy. Systolic function was mildly to  moderately reduced. The estimated ejection fraction was in the  range of 40% to 45%. Diffuse hypokinesis. There is severe  hypokinesis of the entireinferolateral and inferior myocardium.  There was an increased relative contribution of atrial  contraction to ventricular filling. Doppler parameters are  consistent with abnormal left ventricular relaxation (grade 1  diastolic dysfunction).  - Ventricular septum: Septal motion showed paradox.  - Aortic valve: Moderate diffuse thickening and calcification,  consistent with sclerosis. There was mild regurgitation.  - Tricuspid valve: There was trivial regurgitation.   GXT: 06/2015  There was no ST segment deviation noted during stress.   Laboratory Data:  Chemistry Recent Labs  Lab 02/06/20 1019  NA 135  K 4.2  CL 102  CO2 22  GLUCOSE 125*  BUN 27*  CREATININE 1.67*  CALCIUM 8.5*  GFRNONAA 35*  GFRAA 41*  ANIONGAP  11    No results for input(s): PROT, ALBUMIN, AST, ALT, ALKPHOS, BILITOT in the last 168 hours. Hematology Recent Labs  Lab 02/06/20 1019  WBC 8.8  RBC 3.82*  HGB 12.3*  HCT 37.9*  MCV 99.2  MCH 32.2  MCHC 32.5  RDW 12.2  PLT 303   Cardiac EnzymesNo results for input(s): TROPONINI in the last 168 hours. No results for input(s): TROPIPOC in the last 168 hours.  BNPNo results for input(s): BNP, PROBNP in the last 168 hours.  DDimer No results for input(s): DDIMER in the last 168 hours.  Radiology/Studies:  CUP PACEART REMOTE DEVICE CHECK  Result Date: 02/03/2020 Scheduled remote reviewed. Normal device function.  Next remote 91 days. JM    Assessment & Plan    1. Wide complex irregular tachycardia, consistent with afib with aberrancy - EKG and tele review consistent with afib with aberrancy, noted beat to beat R-R interval variation - had been started on IV amio in ER, converted to SR - device check in ER consistent with afib - continue IV amio overnight, convert to oral tomorrow - start eliquis 5mg  bid, stop ASA - repeat echo      For questions or updates, please contact [facility] Please consult [email or web address] for contact info under Cardiology/STEMI.  Signed, Carlyle Dolly MD

## 2020-02-06 NOTE — Progress Notes (Signed)
Asked to review EKG and telemetry by ER staff for wide complex tachycardia. Wide complex tach with consistent beat to beat  R-R variability consistent with afib with aberrancy as opposed to VT. Of note has BiV pacer, his native complex is a wide LBBB. Agree with initiating amio, we will see as consult.    Carlyle Dolly MD

## 2020-02-06 NOTE — ED Triage Notes (Signed)
Pt presents after syncope episode earlier today. Pt states he was walking and became dizzy and passed out for a few seconds. Pt states he feels better now. Pt with pacemaker.

## 2020-02-07 ENCOUNTER — Observation Stay (HOSPITAL_BASED_OUTPATIENT_CLINIC_OR_DEPARTMENT_OTHER): Payer: Medicare Other

## 2020-02-07 DIAGNOSIS — I361 Nonrheumatic tricuspid (valve) insufficiency: Secondary | ICD-10-CM | POA: Diagnosis not present

## 2020-02-07 DIAGNOSIS — I4891 Unspecified atrial fibrillation: Secondary | ICD-10-CM | POA: Diagnosis not present

## 2020-02-07 DIAGNOSIS — I351 Nonrheumatic aortic (valve) insufficiency: Secondary | ICD-10-CM

## 2020-02-07 LAB — ECHOCARDIOGRAM COMPLETE
AR max vel: 1.98 cm2
AV Area VTI: 2.14 cm2
AV Area mean vel: 1.81 cm2
AV Mean grad: 6.3 mmHg
AV Peak grad: 11.3 mmHg
Ao pk vel: 1.68 m/s
Area-P 1/2: 2.72 cm2
Height: 72 in
P 1/2 time: 526 msec
S' Lateral: 3.71 cm
Weight: 2816 oz

## 2020-02-07 LAB — MAGNESIUM: Magnesium: 2.1 mg/dL (ref 1.7–2.4)

## 2020-02-07 LAB — BASIC METABOLIC PANEL
Anion gap: 9 (ref 5–15)
BUN: 27 mg/dL — ABNORMAL HIGH (ref 8–23)
CO2: 26 mmol/L (ref 22–32)
Calcium: 8.4 mg/dL — ABNORMAL LOW (ref 8.9–10.3)
Chloride: 104 mmol/L (ref 98–111)
Creatinine, Ser: 1.68 mg/dL — ABNORMAL HIGH (ref 0.61–1.24)
GFR calc Af Amer: 41 mL/min — ABNORMAL LOW (ref >60)
GFR calc non Af Amer: 35 mL/min — ABNORMAL LOW (ref >60)
Glucose, Bld: 137 mg/dL — ABNORMAL HIGH (ref 70–99)
Potassium: 3.8 mmol/L (ref 3.5–5.1)
Sodium: 139 mmol/L (ref 135–145)

## 2020-02-07 LAB — MRSA PCR SCREENING: MRSA by PCR: NEGATIVE

## 2020-02-07 MED ORDER — AMIODARONE HCL 200 MG PO TABS: 200.0000 mg | ORAL_TABLET | Freq: Two times a day (BID) | ORAL | 0 refills | Status: DC

## 2020-02-07 MED ORDER — AMIODARONE HCL 200 MG PO TABS
200.0000 mg | ORAL_TABLET | Freq: Every day | ORAL | 3 refills | Status: DC
Start: 2020-02-29 — End: 2020-06-03

## 2020-02-07 MED ORDER — APIXABAN 2.5 MG PO TABS: 2.5000 mg | ORAL_TABLET | Freq: Two times a day (BID) | ORAL | 3 refills | Status: DC

## 2020-02-07 MED ORDER — AMIODARONE HCL 200 MG PO TABS
200.0000 mg | ORAL_TABLET | Freq: Two times a day (BID) | ORAL | Status: DC
  Administered 2020-02-07: 200 mg via ORAL
  Filled 2020-02-07: qty 1

## 2020-02-07 NOTE — Progress Notes (Signed)
*  PRELIMINARY RESULTS* Echocardiogram 2D Echocardiogram has been performed.  Leavy Cella 02/07/2020, 9:36 AM

## 2020-02-07 NOTE — Discharge Summary (Signed)
Physician Discharge Summary  Max Cole VZC:[id number] DOB: 1928/05/19 DOA: 02/06/2020  PCP: Asencion Noble, MD  Admit date: 02/06/2020  Discharge date: 02/07/2020  Admitted From:Home  Disposition:  Home  Recommendations for Outpatient Follow-up:  1. Follow up with PCP in 1-2 weeks 2. Follow-up with cardiologist Dr. Lovena Le on 02/28/2020 as scheduled 3. Continue on amiodarone 200 mg twice daily for 3 weeks then 200 mg daily as prescribed 4. Continue now on Eliquis 2.5 mg twice daily for anticoagulation and discontinue home aspirin  Home Health: None  Equipment/Devices: None  Discharge Condition: Stable  CODE STATUS: Full  Diet recommendation: Heart Healthy  Brief/Interim Summary: Per HPI: Max Cole is a 86 y.o. male with medical history of systolic CHF status post BiV PPM, CKD stage III, carotid stenosis, hyperlipidemia, and hypertension presenting syncopal episode.  He was walking earlier in the morning on 1221.  The patient was walking and jogging on his treadmill and was feeling fine without any chest discomfort or shortness of breath.  Approximately 3 minutes after exercising, patient was talking to his friend for an ER nurse and with episode with dizziness.  He denied any chest discomfort, shortness breath, nausea, vomiting, diaphoresis.  Patient states that he routinely exercises without any difficulty with increasing dyspnea or chest discomfort recently.  He denies any fevers, chills, nausea, vomiting or direct abdominal pain.  Denies any new medications. In the emergency department, afebrile hemodynamically stable with oxygen saturation 100% on room air.  Initially the patient was noted to have a wide-complex tachycardia with a heart rate in the 150s.  Cardiology was consulted patient atrial fibrillation with aberrancy after reviewed EKG.  Patient was started on amiodarone drip.  He spontaneously converted to sinus rhythm.  Patient will be admitted for further evaluation of his  atrial fibrillation.  Patient was admitted with wide-complex irregular tachycardia that was consistent with A. fib with aberrancy.  He was started on IV amiodarone in the ED and had converted to sinus rhythm in the emergency department.  He will not be converted to oral amiodarone per cardiology recommendations as noted above and has also been started on anticoagulation with Eliquis.  2D echocardiogram performed with LVEF 50-55% and grade 1 diastolic dysfunction noted with no other wall motion abnormalities.  He is stable for discharge from cardiology standpoint today and will need follow-up with his cardiologist Dr. Lovena Le as noted above.  He is in stable condition for discharge and is eager to go home.  No other acute events noted during this brief admission.  Discharge Diagnoses:  Active Problems:   CKD (chronic kidney disease) stage 3, GFR 30-59 ml/min   Syncope   Nonischemic cardiomyopathy (HCC)   Atrial fibrillation with RVR (HCC)   Systolic and diastolic CHF, chronic ([place])  Principal discharge diagnosis: Atrial fibrillation with aberrancy.  Discharge Instructions  Discharge Instructions    Diet - low sodium heart healthy   Complete by: As directed    Increase activity slowly   Complete by: As directed      Allergies as of 02/07/2020   No Known Allergies     Medication List    STOP taking these medications   aspirin 81 MG tablet     TAKE these medications   amiodarone 200 MG tablet Commonly known as: PACERONE Take 1 tablet (200 mg total) by mouth 2 (two) times daily for 21 days.   amiodarone 200 MG tablet Commonly known as: Pacerone Take 1 tablet (200 mg total) by mouth  daily. Start taking on: February 29, 2020   apixaban 2.5 MG Tabs tablet Commonly known as: ELIQUIS Take 1 tablet (2.5 mg total) by mouth 2 (two) times daily.   carvedilol 3.125 MG tablet Commonly known as: COREG TAKE (1) TABLET BY MOUTH TWICE DAILY WITH A MEAL. What changed:   how much to  take  how to take this  when to take this   ferrous sulfate 325 (65 FE) MG tablet Take 325 mg by mouth daily with breakfast.   pravastatin 20 MG tablet Commonly known as: PRAVACHOL Take 1 tablet (20 mg total) by mouth every evening. Must make appt for further refills [id number] What changed: when to take this       Follow-up Information    Evans Lance, MD Follow up on 02/28/2020.   Specialty: Cardiology Why: Cardiology Hospital Follow-up on 02/28/2020 at 9:45 AM.  Contact information: [place] [address] [phone]        Asencion Noble, MD Follow up in 1 week(s).   Specialty: Internal Medicine Contact information: [address] [place] [address] [phone]              No Known Allergies  Consultations:  Cardiology   Procedures/Studies: ECHOCARDIOGRAM COMPLETE  Result Date: 02/07/2020    ECHOCARDIOGRAM REPORT   Patient Name:   Max Cole Date of Exam: 02/07/2020 Medical Rec #:  [id number]       Height:       72.0 in Accession #:    [id number]      Weight:       176.0 lb Date of Birth:  05/17/1928       BSA:          2.018 m Patient Age:    86 years        BP:           174/63 mmHg Patient Gender: M               HR:           62 bpm. Exam Location:  [facility] Procedure: 2D Echo Indications:    Atrial Fibrillation 427.31 / I48.91  History:        Patient has prior history of Echocardiogram examinations, most                 recent 05/21/2015. Signs/Symptoms:Syncope; Risk Factors:Former                 Smoker and Dyslipidemia. LBBB, Bradycardia, Cardiomyopathy,                 nonischemic.  Sonographer:    Leavy Cella RDCS (AE) Referring Phys: [id number] DAVID TAT IMPRESSIONS  1. Left ventricular ejection fraction, by estimation, is 50 to 55%. The left ventricle has low normal function. The left ventricle has no regional wall motion abnormalities. There is mild left ventricular hypertrophy. Left ventricular diastolic parameters are  consistent with Grade I diastolic dysfunction (impaired relaxation).  2. Right ventricular systolic function is normal. The right ventricular size is normal. There is mildly elevated pulmonary artery systolic pressure.  3. Left atrial size was severely dilated.  4. Right atrial size was moderately dilated.  5. The mitral valve is normal in structure. No evidence of mitral valve regurgitation. No evidence of mitral stenosis.  6. The aortic valve is tricuspid. Aortic valve regurgitation is mild. No aortic stenosis is present.  7. The inferior vena cava is normal in  size with greater than 50% respiratory variability, suggesting right atrial pressure of 3 mmHg. FINDINGS  Left Ventricle: Left ventricular ejection fraction, by estimation, is 50 to 55%. The left ventricle has low normal function. The left ventricle has no regional wall motion abnormalities. The left ventricular internal cavity size was normal in size. There is mild left ventricular hypertrophy. Left ventricular diastolic parameters are consistent with Grade I diastolic dysfunction (impaired relaxation). Normal left ventricular filling pressure. Right Ventricle: The right ventricular size is normal. No increase in right ventricular wall thickness. Right ventricular systolic function is normal. There is mildly elevated pulmonary artery systolic pressure. The tricuspid regurgitant velocity is 2.74  m/s, and with an assumed right atrial pressure of 10 mmHg, the estimated right ventricular systolic pressure is 49.7 mmHg. Left Atrium: Left atrial size was severely dilated. Right Atrium: Right atrial size was moderately dilated. Pericardium: There is no evidence of pericardial effusion. Mitral Valve: The mitral valve is normal in structure. No evidence of mitral valve regurgitation. No evidence of mitral valve stenosis. Tricuspid Valve: The tricuspid valve is normal in structure. Tricuspid valve regurgitation is mild . No evidence of tricuspid stenosis. Aortic  Valve: The aortic valve is tricuspid. . There is moderate thickening and moderate calcification of the aortic valve. Aortic valve regurgitation is mild. Aortic regurgitation PHT measures 526 msec. No aortic stenosis is present. Moderate aortic valve annular calcification. There is moderate thickening of the aortic valve. There is moderate calcification of the aortic valve. Aortic valve mean gradient measures 6.3 mmHg. Aortic valve peak gradient measures 11.3 mmHg. Aortic valve area, by VTI measures 2.14 cm. Pulmonic Valve: The pulmonic valve was not well visualized. Pulmonic valve regurgitation is trivial. No evidence of pulmonic stenosis. Aorta: The aortic root is normal in size and structure. Pulmonary Artery: Indeterminant PASP, inadequate TR jet. Venous: The inferior vena cava is normal in size with greater than 50% respiratory variability, suggesting right atrial pressure of 3 mmHg. IAS/Shunts: No atrial level shunt detected by color flow Doppler. Additional Comments: A pacer wire is visualized.  LEFT VENTRICLE PLAX 2D LVIDd:         5.02 cm  Diastology LVIDs:         3.71 cm  LV e' lateral:   6.20 cm/s LV PW:         1.10 cm  LV E/e' lateral: 7.7 LV IVS:        1.15 cm  LV e' medial:    5.98 cm/s LVOT diam:     2.20 cm  LV E/e' medial:  8.0 LV SV:         74 LV SV Index:   37 LVOT Area:     3.80 cm  RIGHT VENTRICLE RV S prime:     15.20 cm/s TAPSE (M-mode): 3.6 cm LEFT ATRIUM             Index       RIGHT ATRIUM           Index LA diam:        3.50 cm 1.73 cm/m  RA Area:     24.60 cm LA Vol (A2C):   95.8 ml 47.48 ml/m RA Volume:   87.50 ml  43.36 ml/m LA Vol (A4C):   78.7 ml 39.00 ml/m LA Biplane Vol: 88.5 ml 43.86 ml/m  AORTIC VALVE AV Area (Vmax):    1.98 cm AV Area (Vmean):   1.81 cm AV Area (VTI):     2.14 cm AV  Vmax:           168.31 cm/s AV Vmean:          119.495 cm/s AV VTI:            0.345 m AV Peak Grad:      11.3 mmHg AV Mean Grad:      6.3 mmHg LVOT Vmax:         87.83 cm/s LVOT Vmean:         56.886 cm/s LVOT VTI:          0.194 m LVOT/AV VTI ratio: 0.56 AI PHT:            526 msec  AORTA Ao Root diam: 3.40 cm MITRAL VALVE               TRICUSPID VALVE MV Area (PHT): 2.72 cm    TR Peak grad:   30.0 mmHg MV Decel Time: 279 msec    TR Vmax:        274.00 cm/s MV E velocity: 48.00 cm/s MV A velocity: 60.00 cm/s  SHUNTS MV E/A ratio:  0.80        Systemic VTI:  0.19 m                            Systemic Diam: 2.20 cm Carlyle Dolly MD Electronically signed by Carlyle Dolly MD Signature Date/Time: 02/07/2020/11:30:46 AM    Final    CUP PACEART REMOTE DEVICE CHECK  Result Date: 02/03/2020 Scheduled remote reviewed. Normal device function.  Next remote 91 days. JM    Discharge Exam: Vitals:   02/07/20 1000 02/07/20 1100  BP: (!) 155/56 (!) 150/63  Pulse: 63 61  Resp: 19 16  Temp:    SpO2: 97% 96%   Vitals:   02/07/20 0800 02/07/20 0900 02/07/20 1000 02/07/20 1100  BP: (!) 170/55 (!) 174/63 (!) 155/56 (!) 150/63  Pulse: 60 62 63 61  Resp:  15 19 16   Temp:      TempSrc:      SpO2: 95% 94% 97% 96%  Weight:      Height:        General: Pt is alert, awake, not in acute distress Cardiovascular: RRR, S1/S2 +, no rubs, no gallops Respiratory: CTA bilaterally, no wheezing, no rhonchi Abdominal: Soft, NT, ND, bowel sounds + Extremities: no edema, no cyanosis    The results of significant diagnostics from this hospitalization (including imaging, microbiology, ancillary and laboratory) are listed below for reference.     Microbiology: Recent Results (from the past 240 hour(s))  SARS Coronavirus 2 by RT PCR (hospital order, performed in [facility] hospital lab) Nasopharyngeal Nasopharyngeal Swab     Status: None   Collection Time: 02/06/20 12:09 PM   Specimen: Nasopharyngeal Swab  Result Value Ref Range Status   SARS Coronavirus 2 NEGATIVE NEGATIVE Final    Comment: (NOTE) SARS-CoV-2 target nucleic acids are NOT DETECTED.  The SARS-CoV-2 RNA is generally detectable  in upper and lower respiratory specimens during the acute phase of infection. The lowest concentration of SARS-CoV-2 viral copies this assay can detect is 250 copies / mL. A negative result does not preclude SARS-CoV-2 infection and should not be used as the sole basis for treatment or other patient management decisions.  A negative result may occur with improper specimen collection / handling, submission of specimen other than nasopharyngeal swab, presence of viral mutation(s) within the areas targeted by this  assay, and inadequate number of viral copies (<250 copies / mL). A negative result must be combined with clinical observations, patient history, and epidemiological information.  Fact Sheet for Patients:   StrictlyIdeas.no  Fact Sheet for Healthcare Providers: BankingDealers.co.za  This test is not yet approved or  cleared by the [place] FDA and has been authorized for detection and/or diagnosis of SARS-CoV-2 by FDA under an Emergency Use Authorization (EUA).  This EUA will remain in effect (meaning this test can be used) for the duration of the COVID-19 declaration under Section 564(b)(1) of the Act, 21 U.S.C. section 360bbb-3(b)(1), unless the authorization is terminated or revoked sooner.  Performed at [facility], [address] [place], [place] [address]   MRSA PCR Screening     Status: None   Collection Time: 02/06/20  6:12 PM   Specimen: Nasal Mucosa; Nasopharyngeal  Result Value Ref Range Status   MRSA by PCR NEGATIVE NEGATIVE Final    Comment:        The GeneXpert MRSA Assay (FDA approved for NASAL specimens only), is one component of a comprehensive MRSA colonization surveillance program. It is not intended to diagnose MRSA infection nor to guide or monitor treatment for MRSA infections. Performed at [facility], [address], [place], [place] [address]      Labs: BNP (last 3 results) No results  for input(s): BNP in the last 8760 hours. Basic Metabolic Panel: Recent Labs  Lab 02/06/20 1019 02/07/20 0345  NA 135 139  K 4.2 3.8  CL 102 104  CO2 22 26  GLUCOSE 125* 137*  BUN 27* 27*  CREATININE 1.67* 1.68*  CALCIUM 8.5* 8.4*  MG 1.9 2.1   Liver Function Tests: No results for input(s): AST, ALT, ALKPHOS, BILITOT, PROT, ALBUMIN in the last 168 hours. No results for input(s): LIPASE, AMYLASE in the last 168 hours. No results for input(s): AMMONIA in the last 168 hours. CBC: Recent Labs  Lab 02/06/20 1019  WBC 8.8  NEUTROABS 5.7  HGB 12.3*  HCT 37.9*  MCV 99.2  PLT 303   Cardiac Enzymes: No results for input(s): CKTOTAL, CKMB, CKMBINDEX, TROPONINI in the last 168 hours. BNP: Invalid input(s): POCBNP CBG: No results for input(s): GLUCAP in the last 168 hours. D-Dimer No results for input(s): DDIMER in the last 72 hours. Hgb A1c No results for input(s): HGBA1C in the last 72 hours. Lipid Profile No results for input(s): CHOL, HDL, LDLCALC, TRIG, CHOLHDL, LDLDIRECT in the last 72 hours. Thyroid function studies Recent Labs    02/06/20 1019  TSH 1.925   Anemia work up No results for input(s): VITAMINB12, FOLATE, FERRITIN, TIBC, IRON, RETICCTPCT in the last 72 hours. Urinalysis    Component Value Date/Time   LABSPEC 1.018 02/26/2009 0000   PHURINE 6.0 02/26/2009 0000   GLUCOSEU neg 02/26/2009 0000   PROTEINUR neg 02/26/2009 0000   NITRITE neg 02/26/2009 0000   Sepsis Labs Invalid input(s): PROCALCITONIN,  WBC,  LACTICIDVEN Microbiology Recent Results (from the past 240 hour(s))  SARS Coronavirus 2 by RT PCR (hospital order, performed in [facility] hospital lab) Nasopharyngeal Nasopharyngeal Swab     Status: None   Collection Time: 02/06/20 12:09 PM   Specimen: Nasopharyngeal Swab  Result Value Ref Range Status   SARS Coronavirus 2 NEGATIVE NEGATIVE Final    Comment: (NOTE) SARS-CoV-2 target nucleic acids are NOT DETECTED.  The SARS-CoV-2 RNA is  generally detectable in upper and lower respiratory specimens during the acute phase of infection. The lowest  concentration of SARS-CoV-2 viral copies this assay can detect is 250 copies / mL. A negative result does not preclude SARS-CoV-2 infection and should not be used as the sole basis for treatment or other patient management decisions.  A negative result may occur with improper specimen collection / handling, submission of specimen other than nasopharyngeal swab, presence of viral mutation(s) within the areas targeted by this assay, and inadequate number of viral copies (<250 copies / mL). A negative result must be combined with clinical observations, patient history, and epidemiological information.  Fact Sheet for Patients:   StrictlyIdeas.no  Fact Sheet for Healthcare Providers: BankingDealers.co.za  This test is not yet approved or  cleared by the [place] FDA and has been authorized for detection and/or diagnosis of SARS-CoV-2 by FDA under an Emergency Use Authorization (EUA).  This EUA will remain in effect (meaning this test can be used) for the duration of the COVID-19 declaration under Section 564(b)(1) of the Act, 21 U.S.C. section 360bbb-3(b)(1), unless the authorization is terminated or revoked sooner.  Performed at [facility], [address] [place], [place] [address]   MRSA PCR Screening     Status: None   Collection Time: 02/06/20  6:12 PM   Specimen: Nasal Mucosa; Nasopharyngeal  Result Value Ref Range Status   MRSA by PCR NEGATIVE NEGATIVE Final    Comment:        The GeneXpert MRSA Assay (FDA approved for NASAL specimens only), is one component of a comprehensive MRSA colonization surveillance program. It is not intended to diagnose MRSA infection nor to guide or monitor treatment for MRSA infections. Performed at [facility], [address], [place], [place] [address]      Time coordinating  discharge: 35 minutes  SIGNED:   Rodena Goldmann, DO [facility] 02/07/2020, 11:44 AM  If 7PM-7AM, please contact night-coverage [email or web address]

## 2020-02-07 NOTE — Progress Notes (Signed)
Progress Note  Patient Name: Max Cole Date of Encounter: 02/07/2020  [facility] Cardiologist: Cristopher Peru, MD   Subjective   No complaints  Inpatient Medications    Scheduled Meds: . apixaban  2.5 mg Oral BID  . aspirin EC  81 mg Oral Daily  . carvedilol  3.125 mg Oral Daily  . Chlorhexidine Gluconate Cloth  6 each Topical Daily  . ferrous sulfate  325 mg Oral Q breakfast  . pravastatin  20 mg Oral Daily   Continuous Infusions: . amiodarone 30 mg/hr (02/07/20 0919)   PRN Meds:    Vital Signs    Vitals:   02/07/20 0700 02/07/20 0743 02/07/20 0800 02/07/20 0900  BP: (!) 175/55  (!) 170/55 (!) 174/63  Pulse: 60 60 60 62  Resp:    15  Temp:  97.9 F (36.6 C)    TempSrc:  Oral    SpO2: (!) 89% 99% 95% 94%  Weight:      Height:        Intake/Output Summary (Last 24 hours) at 02/07/2020 0952 Last data filed at 02/07/2020 0919 Gross per 24 hour  Intake 536.1 ml  Output 2000 ml  Net -1463.9 ml   Last 3 Weights 02/06/2020 11/14/2019 02/13/2019  Weight (lbs) 176 lb 179 lb 177 lb  Weight (kg) 79.833 kg 81.194 kg 80.287 kg      Telemetry    AV paced - Personally Reviewed  ECG    n/a - Personally Reviewed  Physical Exam   GEN: No acute distress.   Neck: No JVD Cardiac: RRR, no murmurs, rubs, or gallops.  Respiratory: Clear to auscultation bilaterally. GI: Soft, nontender, non-distended  MS: No edema; No deformity. Neuro:  Nonfocal  Psych: Normal affect   Labs    High Sensitivity Troponin:   Recent Labs  Lab 02/06/20 1019 02/06/20 1219  TROPONINIHS 8 11      Chemistry Recent Labs  Lab 02/06/20 1019 02/07/20 0345  NA 135 139  K 4.2 3.8  CL 102 104  CO2 22 26  GLUCOSE 125* 137*  BUN 27* 27*  CREATININE 1.67* 1.68*  CALCIUM 8.5* 8.4*  GFRNONAA 35* 35*  GFRAA 41* 41*  ANIONGAP 11 9     Hematology Recent Labs  Lab 02/06/20 1019  WBC 8.8  RBC 3.82*  HGB 12.3*  HCT 37.9*  MCV 99.2  MCH 32.2  MCHC 32.5  RDW 12.2  PLT  303    BNPNo results for input(s): BNP, PROBNP in the last 168 hours.   DDimer No results for input(s): DDIMER in the last 168 hours.   Radiology    No results found.  Cardiac Studies     Patient Profile     Max Cole is a 86 y.o. male with past medical history of chronic systolic CHF/presumed NICM (EF 35-40% by echo in 2005 with cath showing nonobstructive CAD, EF at 40-45% by echo in 2016), CHB (s/p [place] CRT-P in 06/2015), LBBB, HTN, HLD, Stage 3 CKD and carotid artery stenosis who is being seen today for the evaluation of wide-complex tachycardia at the request of Dr. Sedonia Small.   Assessment & Plan    1. Wide complex irregular tachycardia, consistent with afib with aberrancy - EKG and tele review consistent with afib with aberrancy, noted beat to beat R-R interval variation - device check in ER consistent with afib - had been started on IV amio in ER, converted to SR in ER  - convert IV amio  to oral, 200mg  bid x 3 weeks, then 200mg  daily - started on oral eliquis 2.5mg  bid based on renal function and age.  - f/u repeat echo  Likely discharge later today pending echo    For questions or updates, please contact [place] Please consult [email or web address] for contact info under        Signed, Carlyle Dolly, MD  02/07/2020, 9:52 AM

## 2020-02-07 NOTE — Care Management Important Message (Signed)
Important Message  Patient Details  Name: Max Cole MRN: [id number] Date of Birth: Nov 03, 1927   Medicare Important Message Given:  Yes     Boneta Lucks, RN 02/07/2020, 11:52 AM

## 2020-02-07 NOTE — Progress Notes (Signed)
MD aware of patient's increasing blood pressure. Will continue to monitor for now and provide ordered coreg early if needed per MD.

## 2020-02-11 DIAGNOSIS — L821 Other seborrheic keratosis: Secondary | ICD-10-CM | POA: Diagnosis not present

## 2020-02-11 DIAGNOSIS — L814 Other melanin hyperpigmentation: Secondary | ICD-10-CM | POA: Diagnosis not present

## 2020-02-11 DIAGNOSIS — L570 Actinic keratosis: Secondary | ICD-10-CM | POA: Diagnosis not present

## 2020-02-11 DIAGNOSIS — L820 Inflamed seborrheic keratosis: Secondary | ICD-10-CM | POA: Diagnosis not present

## 2020-02-11 DIAGNOSIS — D225 Melanocytic nevi of trunk: Secondary | ICD-10-CM | POA: Diagnosis not present

## 2020-02-11 DIAGNOSIS — D1801 Hemangioma of skin and subcutaneous tissue: Secondary | ICD-10-CM | POA: Diagnosis not present

## 2020-02-11 DIAGNOSIS — L905 Scar conditions and fibrosis of skin: Secondary | ICD-10-CM | POA: Diagnosis not present

## 2020-02-19 DIAGNOSIS — M9905 Segmental and somatic dysfunction of pelvic region: Secondary | ICD-10-CM | POA: Diagnosis not present

## 2020-02-19 DIAGNOSIS — S76211A Strain of adductor muscle, fascia and tendon of right thigh, initial encounter: Secondary | ICD-10-CM | POA: Diagnosis not present

## 2020-02-19 DIAGNOSIS — M545 Low back pain: Secondary | ICD-10-CM | POA: Diagnosis not present

## 2020-02-19 DIAGNOSIS — M9902 Segmental and somatic dysfunction of thoracic region: Secondary | ICD-10-CM | POA: Diagnosis not present

## 2020-02-19 DIAGNOSIS — M9903 Segmental and somatic dysfunction of lumbar region: Secondary | ICD-10-CM | POA: Diagnosis not present

## 2020-02-26 DIAGNOSIS — M9902 Segmental and somatic dysfunction of thoracic region: Secondary | ICD-10-CM | POA: Diagnosis not present

## 2020-02-26 DIAGNOSIS — M9903 Segmental and somatic dysfunction of lumbar region: Secondary | ICD-10-CM | POA: Diagnosis not present

## 2020-02-26 DIAGNOSIS — M9905 Segmental and somatic dysfunction of pelvic region: Secondary | ICD-10-CM | POA: Diagnosis not present

## 2020-02-26 DIAGNOSIS — M545 Low back pain: Secondary | ICD-10-CM | POA: Diagnosis not present

## 2020-02-26 DIAGNOSIS — S76211A Strain of adductor muscle, fascia and tendon of right thigh, initial encounter: Secondary | ICD-10-CM | POA: Diagnosis not present

## 2020-02-28 ENCOUNTER — Ambulatory Visit: Payer: Medicare Other | Admitting: Internal Medicine

## 2020-02-28 ENCOUNTER — Other Ambulatory Visit: Payer: Self-pay

## 2020-02-28 ENCOUNTER — Encounter: Payer: Self-pay | Admitting: Internal Medicine

## 2020-02-28 VITALS — BP 110/64 | HR 64 | Ht 73.0 in | Wt 178.0 lb

## 2020-02-28 DIAGNOSIS — I428 Other cardiomyopathies: Secondary | ICD-10-CM | POA: Diagnosis not present

## 2020-02-28 DIAGNOSIS — I5022 Chronic systolic (congestive) heart failure: Secondary | ICD-10-CM

## 2020-02-28 NOTE — Patient Instructions (Signed)
Medication Instructions:  Your physician recommends that you continue on your current medications as directed. Please refer to the Current Medication list given to you today.  *If you need a refill on your cardiac medications before your next appointment, please call your pharmacy*   Lab Work: NONE   If you have labs (blood work) drawn today and your tests are completely normal, you will receive your results only by: . MyChart Message (if you have MyChart) OR . A paper copy in the mail If you have any lab test that is abnormal or we need to change your treatment, we will call you to review the results.   Testing/Procedures: NONE    Follow-Up: At [facility], you and your health needs are our priority.  As part of our continuing mission to provide you with exceptional heart care, we have created designated Provider Care Teams.  These Care Teams include your primary Cardiologist (physician) and Advanced Practice Providers (APPs -  Physician Assistants and Nurse Practitioners) who all work together to provide you with the care you need, when you need it.  We recommend signing up for the patient portal called "MyChart".  Sign up information is provided on this After Visit Summary.  MyChart is used to connect with patients for Virtual Visits (Telemedicine).  Patients are able to view lab/test results, encounter notes, upcoming appointments, etc.  Non-urgent messages can be sent to your provider as well.   To learn more about what you can do with MyChart, go to [email or web address].    Your next appointment:   1 year(s)  The format for your next appointment:   In Person  Provider:   Gregg Taylor, MD   Other Instructions Thank you for choosing [facility]!    

## 2020-02-28 NOTE — Progress Notes (Signed)
HPI  Max Cole returns for ongoing followup. He is a pleasant 86 year old man with a history of chronic systolic heart failure, paroxysmal atrial fibrillation who is been fairly well controlled on amiodarone therapy.  In the interim he has continued to exercise 3 or 4 times a week.  He walks almost every day.  He denies chest pain or shortness of breath.  He notes that he had some swelling in his legs for a couple of weeks which has resolved. No Known Allergies   Current Outpatient Medications  Medication Sig Dispense Refill  . [START ON 02/29/2020] amiodarone (PACERONE) 200 MG tablet Take 1 tablet (200 mg total) by mouth daily. 30 tablet 3  . apixaban (ELIQUIS) 2.5 MG TABS tablet Take 1 tablet (2.5 mg total) by mouth 2 (two) times daily. 60 tablet 3  . carvedilol (COREG) 3.125 MG tablet Take 3.125 mg by mouth daily.    . ferrous sulfate 325 (65 FE) MG tablet Take 325 mg by mouth daily with breakfast.    . pravastatin (PRAVACHOL) 20 MG tablet Take 1 tablet (20 mg total) by mouth every evening. Must make appt for further refills [phone] (Patient taking differently: Take 20 mg by mouth every morning. Must make appt for further refills [phone]) 30 tablet 0   No current facility-administered medications for this visit.     Past Medical History:  Diagnosis Date  . Atrial fibrillation ([place])   . Bradycardia   . Cardiomyopathy 2005   a. Possibly alcoholic; b. cath in 9390-30% ostial D1, PCW of 12, EF of 35-40%;  c. EF of 0.25 in 11/2003;  d. 40-45% in 05/2004;  e. 25% in 10/2008 by echo;  f. 04/2015 Echo: EF 40-45%, diff HK, sev inflat/inf HK, Gr 1 DD, mild AI, triv TR.  . Carotid artery occlusion   . Cerebrovascular disease    Carotid ultrasound in 12/2010-60-79% left internal carotid artery, less than 40% or right RICA, no change  . Chronic kidney disease    Creatinine of 1.6 in 2009  . Hyperlipidemia 10/15/2011   Lipid profile in 03/2009:231, 136, 56, 148  . Left bundle  branch block   . Prostate carcinoma ([place])   . Syncope    [place] CRT-P 07/03/15 Dr. Lovena Le    ROS:   All systems reviewed and negative except as noted in the HPI.   Past Surgical History:  Procedure Laterality Date  . CATARACT EXTRACTION     Right  . COLONOSCOPY N/A 08/16/2012   Procedure: COLONOSCOPY;  Surgeon: Rogene Houston, MD;  [facility];  Service: Endoscopy;  Laterality: N/A;  930  . COLONOSCOPY N/A 10/21/2015   Procedure: COLONOSCOPY;  Surgeon: Rogene Houston, MD;  [facility];  Service: Endoscopy;  Laterality: N/A;  1200  . COLONOSCOPY W/ POLYPECTOMY  2010   Diverticulosis  . EP IMPLANTABLE DEVICE N/A 07/03/2015   Procedure: BiV Pacemaker Insertion CRT-P;  Surgeon: Evans Lance, MD;  Location: [facility];  Service: Cardiovascular;  Laterality: N/A;  . EYE SURGERY Right    Cataract  . HERNIA REPAIR       Family History  Problem Relation Age of Onset  . Hypertension Mother      Social History   Socioeconomic History  . Marital status: Married    Spouse name: Not on file  . Number of children: Not on file  . Years of education: Not on file  . Highest education level: Not  on file  Occupational History  . Not on file  Tobacco Use  . Smoking status: Former Smoker    Packs/day: 1.00    Years: 30.00    Pack years: 30.00    Quit date: 09/28/1973    Years since quitting: 46.4  . Smokeless tobacco: Never Used  Vaping Use  . Vaping Use: Never used  Substance and Sexual Activity  . Alcohol use: Yes    Alcohol/week: 5.0 standard drinks    Types: 5 Glasses of wine per week    Comment: History of excessive alcohol use; Alternates between glass of wine vs. Liquor drink 5 days per week (04/2015)  . Drug use: No  . Sexual activity: Not on file  Other Topics Concern  . Not on file  Social History Narrative  . Not on file   Social Determinants of Health   Financial Resource Strain:   . Difficulty of Paying Living Expenses: Not on  file  Food Insecurity:   . Worried About Charity fundraiser in the Last Year: Not on file  . Ran Out of Food in the Last Year: Not on file  Transportation Needs:   . Lack of Transportation (Medical): Not on file  . Lack of Transportation (Non-Medical): Not on file  Physical Activity:   . Days of Exercise per Week: Not on file  . Minutes of Exercise per Session: Not on file  Stress:   . Feeling of Stress : Not on file  Social Connections:   . Frequency of Communication with Friends and Family: Not on file  . Frequency of Social Gatherings with Friends and Family: Not on file  . Attends Religious Services: Not on file  . Active Member of Clubs or Organizations: Not on file  . Attends Archivist Meetings: Not on file  . Marital Status: Not on file  Intimate Partner Violence:   . Fear of Current or Ex-Partner: Not on file  . Emotionally Abused: Not on file  . Physically Abused: Not on file  . Sexually Abused: Not on file     BP 110/64   Pulse 64   Ht 6\' 1"  (1.854 m)   Wt 178 lb (80.7 kg)   SpO2 98%   BMI 23.48 kg/m   Physical Exam:  Well appearing 86 year old man, who looks younger than his stated age, NAD HEENT: Unremarkable Neck:  No JVD, no thyromegally Lymphatics:  No adenopathy Back:  No CVA tenderness Lungs:  Clear, with no wheezes, rales, or rhonchi HEART:  Regular rate rhythm, no murmurs, no rubs, no clicks Abd:  soft, positive bowel sounds, no organomegally, no rebound, no guarding Ext:  2 plus pulses, no edema, no cyanosis, no clubbing Skin:  No rashes no nodules Neuro:  CN II through XII intact, motor grossly intact   DEVICE  Normal device function.  See PaceArt for details.   Assess/Plan: 1.  Chronic systolic heart failure -his symptoms are class I.  He will continue his current medical therapy 2.  Paroxysmal atrial fibrillation -he has been well controlled.  He will continue amiodarone 200 mg daily. 3.  Biventricular pacemaker -his [employer] biventricular pacemaker is working normally.  We will recheck in several months. 4.  Chronic anticoagulation -he is tolerating Eliquis very nicely.  He has had no bleeding.  Cristopher Peru, MD

## 2020-03-04 DIAGNOSIS — M9902 Segmental and somatic dysfunction of thoracic region: Secondary | ICD-10-CM | POA: Diagnosis not present

## 2020-03-04 DIAGNOSIS — M9905 Segmental and somatic dysfunction of pelvic region: Secondary | ICD-10-CM | POA: Diagnosis not present

## 2020-03-04 DIAGNOSIS — M545 Low back pain: Secondary | ICD-10-CM | POA: Diagnosis not present

## 2020-03-04 DIAGNOSIS — S76211A Strain of adductor muscle, fascia and tendon of right thigh, initial encounter: Secondary | ICD-10-CM | POA: Diagnosis not present

## 2020-03-04 DIAGNOSIS — M9903 Segmental and somatic dysfunction of lumbar region: Secondary | ICD-10-CM | POA: Diagnosis not present

## 2020-03-30 DIAGNOSIS — M5416 Radiculopathy, lumbar region: Secondary | ICD-10-CM | POA: Diagnosis not present

## 2020-04-16 ENCOUNTER — Other Ambulatory Visit (HOSPITAL_COMMUNITY): Payer: Self-pay | Admitting: Internal Medicine

## 2020-04-16 ENCOUNTER — Other Ambulatory Visit: Payer: Self-pay | Admitting: Internal Medicine

## 2020-04-16 ENCOUNTER — Other Ambulatory Visit: Payer: Self-pay

## 2020-04-16 ENCOUNTER — Ambulatory Visit (HOSPITAL_COMMUNITY)
Admission: RE | Admit: 2020-04-16 | Discharge: 2020-04-16 | Disposition: A | Discharge status: Home / Self Care | Payer: Medicare Other | Source: Ambulatory Visit | Attending: Internal Medicine | Admitting: Internal Medicine

## 2020-04-16 DIAGNOSIS — M545 Low back pain, unspecified: Secondary | ICD-10-CM | POA: Diagnosis not present

## 2020-04-16 DIAGNOSIS — M5416 Radiculopathy, lumbar region: Secondary | ICD-10-CM | POA: Diagnosis not present

## 2020-04-16 DIAGNOSIS — M25551 Pain in right hip: Secondary | ICD-10-CM | POA: Diagnosis not present

## 2020-04-16 DIAGNOSIS — Z23 Encounter for immunization: Secondary | ICD-10-CM | POA: Diagnosis not present

## 2020-04-16 DIAGNOSIS — R3915 Urgency of urination: Secondary | ICD-10-CM | POA: Diagnosis not present

## 2020-04-16 DIAGNOSIS — M1611 Unilateral primary osteoarthritis, right hip: Secondary | ICD-10-CM | POA: Diagnosis not present

## 2020-04-16 DIAGNOSIS — R350 Frequency of micturition: Secondary | ICD-10-CM | POA: Diagnosis not present

## 2020-04-16 DIAGNOSIS — M47816 Spondylosis without myelopathy or radiculopathy, lumbar region: Secondary | ICD-10-CM | POA: Diagnosis not present

## 2020-04-16 NOTE — Telephone Encounter (Signed)
This is a [place] pt.  °

## 2020-04-17 ENCOUNTER — Other Ambulatory Visit: Payer: Self-pay | Admitting: Internal Medicine

## 2020-04-17 MED ORDER — PRAVASTATIN SODIUM 20 MG PO TABS: 20.0000 mg | ORAL_TABLET | Freq: Every evening | ORAL | 6 refills | Status: DC

## 2020-04-17 NOTE — Telephone Encounter (Signed)
New message    transfering prescription to [facility]* If patient is at the pharmacy, call can be transferred to refill team.   1. Which medications need to be refilled? (please list name of each medication and dose if known) pravastatin (PRAVACHOL) 20 MG tablet  2. Which pharmacy/location (including street and city if local pharmacy) is medication to be sent to? [facility]  3. Do they need a 30 day or 90 day supply?  [place]

## 2020-04-17 NOTE — Telephone Encounter (Signed)
Refilled per request.

## 2020-04-27 ENCOUNTER — Other Ambulatory Visit: Payer: Self-pay

## 2020-04-27 ENCOUNTER — Ambulatory Visit: Payer: Medicare Other | Admitting: Orthopedic Surgery

## 2020-04-27 ENCOUNTER — Encounter: Payer: Self-pay | Admitting: Orthopedic Surgery

## 2020-04-27 VITALS — Ht 73.0 in | Wt 179.0 lb

## 2020-04-27 DIAGNOSIS — M5137 Other intervertebral disc degeneration, lumbosacral region: Secondary | ICD-10-CM | POA: Diagnosis not present

## 2020-04-27 DIAGNOSIS — M1611 Unilateral primary osteoarthritis, right hip: Secondary | ICD-10-CM

## 2020-04-27 MED ORDER — PREDNISONE 10 MG (48) PO TBPK: ORAL_TABLET | Freq: Every day | ORAL | 5 refills | Status: DC

## 2020-04-27 MED ORDER — METHOCARBAMOL 500 MG PO TABS: 500.0000 mg | ORAL_TABLET | Freq: Three times a day (TID) | ORAL | 1 refills | Status: DC

## 2020-04-27 NOTE — Patient Instructions (Addendum)
therarpy for back pain   Take robaxin as need for the back pain   When the pain is severe take the prednisone dose pack    Chronic Back Pain When back pain lasts longer than 3 months, it is called chronic back pain. Pain may get worse at certain times (flare-ups). There are things you can do at home to manage your pain. Follow these instructions at home: Activity      Avoid bending and other activities that make pain worse.  When standing: ? Keep your upper back and neck straight. ? Keep your shoulders pulled back. ? Avoid slouching.  When sitting: ? Keep your back straight. ? Relax your shoulders. Do not round your shoulders or pull them backward.  Do not sit or stand in one place for long periods of time.  Take short rest breaks during the day. Lying down or standing is usually better than sitting. Resting can help relieve pain.  When sitting or lying down for a long time, do some mild activity or stretching. This will help to prevent stiffness and pain.  Get regular exercise. Ask your doctor what activities are safe for you.  Do not lift anything that is heavier than 10 lb (4.5 kg). To prevent injury when you lift things: ? Bend your knees. ? Keep the weight close to your body. ? Avoid twisting. Managing pain  If told, put ice on the painful area. Your doctor may tell you to use ice for 24-48 hours after a flare-up starts. ? Put ice in a plastic bag. ? Place a towel between your skin and the bag. ? Leave the ice on for 20 minutes, 2-3 times a day.  If told, put heat on the painful area as often as told by your doctor. Use the heat source that your doctor recommends, such as a moist heat pack or a heating pad. ? Place a towel between your skin and the heat source. ? Leave the heat on for 20-30 minutes. ? Remove the heat if your skin turns bright red. This is especially important if you are unable to feel pain, heat, or cold. You may have a greater risk of getting  burned.  Soak in a warm bath. This can help relieve pain.  Take over-the-counter and prescription medicines only as told by your doctor. General instructions  Sleep on a firm mattress. Try lying on your side with your knees slightly bent. If you lie on your back, put a pillow under your knees.  Keep all follow-up visits as told by your doctor. This is important. Contact a doctor if:  You have pain that does not get better with rest or medicine. Get help right away if:  One or both of your arms or legs feel weak.  One or both of your arms or legs lose feeling (numbness).  You have trouble controlling when you poop (bowel movement) or pee (urinate).  You feel sick to your stomach (nauseous).  You throw up (vomit).  You have belly (abdominal) pain.  You have shortness of breath.  You pass out (faint). Summary  When back pain lasts longer than 3 months, it is called chronic back pain.  Pain may get worse at certain times (flare-ups).  Use ice and heat as told by your doctor. Your doctor may tell you to use ice after flare-ups. This information is not intended to replace advice given to you by your health care provider. Make sure you discuss any questions you have  with your health care provider. Document Revised: 10/04/2018 Document Reviewed: 01/26/2017 Elsevier Patient Education  2020 [employer].

## 2020-04-27 NOTE — Progress Notes (Signed)
Chief Complaint  Patient presents with  . Hip Pain    right groin pain   . Back Pain    back into right buttock     Encounter Diagnoses  Name Primary?  . DDD (degenerative disc disease), lumbosacral Yes  . Arthritis of right hip     Max Cole was seen today for hip pain and back pain.  Diagnoses and all orders for this visit:  DDD (degenerative disc disease), lumbosacral -     methocarbamol (ROBAXIN) 500 MG tablet; Take 1 tablet (500 mg total) by mouth 3 (three) times daily. -     predniSONE (STERAPRED UNI-PAK 48 TAB) 10 MG (48) TBPK tablet; Take by mouth daily. -     Ambulatory referral to Physical Therapy  Arthritis of right hip -     methocarbamol (ROBAXIN) 500 MG tablet; Take 1 tablet (500 mg total) by mouth 3 (three) times daily. -     predniSONE (STERAPRED UNI-PAK 48 TAB) 10 MG (48) TBPK tablet; Take by mouth daily. -     Ambulatory referral to Physical Therapy   We ordered physical therapy self-directed prednisone Dosepaks for severe attacks Robaxin daily as needed q. 8  Follow-up as needed  HPI: 86 year old male presents with back and some right thigh pain.  He actually started having right thigh pain which subsequently resolved but his back hurts now and is actually his left lower back that is hurting and took him down for pretty good spell despite 2 rounds of prednisone.  Comes in for evaluation and treatment with no radicular symptoms  Review of systems are negative except for some difficulty urinating some lightheadedness  Past Surgical History:  Procedure Laterality Date  . CATARACT EXTRACTION     Right  . COLONOSCOPY N/A 08/16/2012   Procedure: COLONOSCOPY;  Surgeon: Rogene Houston, MD;  [facility];  Service: Endoscopy;  Laterality: N/A;  930  . COLONOSCOPY N/A 10/21/2015   Procedure: COLONOSCOPY;  Surgeon: Rogene Houston, MD;  [facility];  Service: Endoscopy;  Laterality: N/A;  1200  . COLONOSCOPY W/ POLYPECTOMY  2010   Diverticulosis   . EP IMPLANTABLE DEVICE N/A 07/03/2015   Procedure: BiV Pacemaker Insertion CRT-P;  Surgeon: Evans Lance, MD;  Location: [facility];  Service: Cardiovascular;  Laterality: N/A;  . EYE SURGERY Right    Cataract  . HERNIA REPAIR       Past Medical History:  Diagnosis Date  . Atrial fibrillation ([place])   . Bradycardia   . Cardiomyopathy 2005   a. Possibly alcoholic; b. cath in 8185-63% ostial D1, PCW of 12, EF of 35-40%;  c. EF of 0.25 in 11/2003;  d. 40-45% in 05/2004;  e. 25% in 10/2008 by echo;  f. 04/2015 Echo: EF 40-45%, diff HK, sev inflat/inf HK, Gr 1 DD, mild AI, triv TR.  . Carotid artery occlusion   . Cerebrovascular disease    Carotid ultrasound in 12/2010-60-79% left internal carotid artery, less than 40% or right RICA, no change  . Chronic kidney disease    Creatinine of 1.6 in 2009  . Hyperlipidemia 10/15/2011   Lipid profile in 03/2009:231, 136, 56, 148  . Left bundle branch block   . Prostate carcinoma ([place])   . Syncope    [place] CRT-P 07/03/15 Dr. Lovena Le   Outpatient Encounter Medications as of 04/27/2020  Medication Sig  . amiodarone (PACERONE) 200 MG tablet Take 1 tablet (200 mg total) by mouth daily.  Marland Kitchen  apixaban (ELIQUIS) 2.5 MG TABS tablet Take 1 tablet (2.5 mg total) by mouth 2 (two) times daily.  . carvedilol (COREG) 3.125 MG tablet TAKE (1) TABLET BY MOUTH TWICE DAILY WITH A MEAL.  . ferrous sulfate 325 (65 FE) MG tablet Take 325 mg by mouth daily with breakfast.  . pravastatin (PRAVACHOL) 20 MG tablet Take 1 tablet (20 mg total) by mouth every evening.  . tamsulosin (FLOMAX) 0.4 MG CAPS capsule Take 0.4 mg by mouth daily.   No facility-administered encounter medications on file as of 04/27/2020.    Current Outpatient Medications:  .  amiodarone (PACERONE) 200 MG tablet, Take 1 tablet (200 mg total) by mouth daily., Disp: 30 tablet, Rfl: 3 .  apixaban (ELIQUIS) 2.5 MG TABS tablet, Take 1 tablet (2.5 mg total) by mouth 2 (two) times daily., Disp: 60  tablet, Rfl: 3 .  carvedilol (COREG) 3.125 MG tablet, TAKE (1) TABLET BY MOUTH TWICE DAILY WITH A MEAL., Disp: 180 tablet, Rfl: 1 .  ferrous sulfate 325 (65 FE) MG tablet, Take 325 mg by mouth daily with breakfast., Disp: , Rfl:  .  pravastatin (PRAVACHOL) 20 MG tablet, Take 1 tablet (20 mg total) by mouth every evening., Disp: 30 tablet, Rfl: 6 .  tamsulosin (FLOMAX) 0.4 MG CAPS capsule, Take 0.4 mg by mouth daily., Disp: , Rfl:    Ht 6\' 1"  (1.854 m)   Wt 179 lb (81.2 kg)   BMI 23.62 kg/m    He is a thin male in good shape physically.  He is oriented x3 his mood is pleasant affect is normal he has no sensory deficits in his lower extremities and the reflexes are equal and 2+ at the knee 1+ with downgoing toes bilaterally at the ankles skin is normal throughout all 4 extremities and his lumbar spine  He has normal muscle strength and tone in lower extremities hips are stable no pain with range of motion of either hip no tenderness in the front of the leg or hip he does have tenderness in the lower back gait and station are otherwise normal distally he has good vascular function   X-ray interpretation by me.  Outside images lumbar spine  X-rays done on April 17, 2020 [facility]  Patient has multilevel degenerative disc disease with spurs at L2-3 L3-4 and L5-S1   Moderate disc space narrowing at L5-S1 facet degenerative changes and grade 1 anterolisthesis at L3-4 multilevels of facet arthritis  Report by radiology IMPRESSION: 1. Multilevel degenerative changes, as described above. 2. Mild T12 superior endplate compression deformity, age indeterminate, without acute fracture lines seen. 3. Grade 1 anterolisthesis at the L3-4 level due to facet degenerative changes at that level.   Electronically Signed   By: Claudie Revering M.D.   On: 04/17/2020 21:57  Outside film x-ray interpretation hip pelvis AP and lateral right hip mild disease there is a spur inferiorly but the  joint spaces are maintained

## 2020-04-30 ENCOUNTER — Ambulatory Visit: Payer: Medicare Other | Admitting: Orthopaedic Surgery

## 2020-05-04 ENCOUNTER — Ambulatory Visit (INDEPENDENT_AMBULATORY_CARE_PROVIDER_SITE_OTHER): Payer: Medicare Other

## 2020-05-04 DIAGNOSIS — R001 Bradycardia, unspecified: Secondary | ICD-10-CM

## 2020-05-06 ENCOUNTER — Other Ambulatory Visit: Payer: Self-pay

## 2020-05-06 ENCOUNTER — Ambulatory Visit (HOSPITAL_COMMUNITY): Payer: Medicare Other | Attending: Orthopedic Surgery | Admitting: Physical Therapy

## 2020-05-06 ENCOUNTER — Encounter (HOSPITAL_COMMUNITY): Payer: Self-pay | Admitting: Physical Therapy

## 2020-05-06 DIAGNOSIS — R2689 Other abnormalities of gait and mobility: Secondary | ICD-10-CM | POA: Insufficient documentation

## 2020-05-06 DIAGNOSIS — M545 Low back pain, unspecified: Secondary | ICD-10-CM | POA: Diagnosis not present

## 2020-05-06 DIAGNOSIS — M6281 Muscle weakness (generalized): Secondary | ICD-10-CM | POA: Insufficient documentation

## 2020-05-06 DIAGNOSIS — M25551 Pain in right hip: Secondary | ICD-10-CM | POA: Diagnosis not present

## 2020-05-06 LAB — CUP PACEART REMOTE DEVICE CHECK
Battery Remaining Longevity: 78 mo
Battery Remaining Percentage: 86 %
Brady Statistic RA Percent Paced: 39 %
Brady Statistic RV Percent Paced: 96 %
Date Time Interrogation Session: 20211109190100
Implantable Lead Implant Date: 20170106
Implantable Lead Implant Date: 20170106
Implantable Lead Implant Date: 20170106
Implantable Lead Location: 753858
Implantable Lead Location: 753859
Implantable Lead Location: 753860
Implantable Lead Model: 4677
Implantable Lead Model: 7741
Implantable Lead Model: 7742
Implantable Lead Serial Number: 507382
Implantable Lead Serial Number: 695539
Implantable Lead Serial Number: 724660
Implantable Pulse Generator Implant Date: 20170106
Lead Channel Impedance Value: 616 Ohm
Lead Channel Impedance Value: 635 Ohm
Lead Channel Impedance Value: 670 Ohm
Lead Channel Pacing Threshold Amplitude: 0.9 V
Lead Channel Pacing Threshold Amplitude: 1.2 V
Lead Channel Pacing Threshold Pulse Width: 0.4 ms
Lead Channel Pacing Threshold Pulse Width: 0.8 ms
Lead Channel Setting Pacing Amplitude: 2 V
Lead Channel Setting Pacing Amplitude: 2 V
Lead Channel Setting Pacing Amplitude: 2.2 V
Lead Channel Setting Pacing Pulse Width: 0.4 ms
Lead Channel Setting Pacing Pulse Width: 0.8 ms
Lead Channel Setting Sensing Sensitivity: 2.5 mV
Lead Channel Setting Sensing Sensitivity: 2.5 mV
Pulse Gen Serial Number: 714587

## 2020-05-06 NOTE — Patient Instructions (Signed)
Access Code: [email or web address] URL: [email or web address] Date: 05/06/2020 Prepared by: Mitzi Hansen Kare Dado  Exercises Squat with Counter Support - 1 x daily - 7 x weekly - 3 sets - 10 reps

## 2020-05-06 NOTE — Therapy (Signed)
[place], [place], [address] Phone: [phone]   Fax:  [phone]  Physical Therapy Evaluation  Patient Details  Name: Max Cole MRN: [id number] Date of Birth: 16-Mar-1928 Referring Provider (PT): Arther Abbott MD   Encounter Date: 05/06/2020   PT End of Session - 05/06/20 1142    Visit Number 1    Number of Visits 4    Date for PT Re-Evaluation 06/03/20    Authorization Type U[employer] medicare    PT Start Time 0017    PT Stop Time 1135    PT Time Calculation (min) 43 min    Activity Tolerance Patient tolerated treatment well    Behavior During Therapy [facility] for tasks assessed/performed           Past Medical History:  Diagnosis Date  . Atrial fibrillation ([place])   . Bradycardia   . Cardiomyopathy 2005   a. Possibly alcoholic; b. cath in 4944-96% ostial D1, PCW of 12, EF of 35-40%;  c. EF of 0.25 in 11/2003;  d. 40-45% in 05/2004;  e. 25% in 10/2008 by echo;  f. 04/2015 Echo: EF 40-45%, diff HK, sev inflat/inf HK, Gr 1 DD, mild AI, triv TR.  . Carotid artery occlusion   . Cerebrovascular disease    Carotid ultrasound in 12/2010-60-79% left internal carotid artery, less than 40% or right RICA, no change  . Chronic kidney disease    Creatinine of 1.6 in 2009  . Hyperlipidemia 10/15/2011   Lipid profile in 03/2009:231, 136, 56, 148  . Left bundle branch block   . Prostate carcinoma ([place])   . Syncope    [place] CRT-P 07/03/15 Dr. Lovena Le    Past Surgical History:  Procedure Laterality Date  . CATARACT EXTRACTION     Right  . COLONOSCOPY N/A 08/16/2012   Procedure: COLONOSCOPY;  Surgeon: Rogene Houston, MD;  [facility];  Service: Endoscopy;  Laterality: N/A;  930  . COLONOSCOPY N/A 10/21/2015   Procedure: COLONOSCOPY;  Surgeon: Rogene Houston, MD;  [facility];  Service: Endoscopy;  Laterality: N/A;  1200  . COLONOSCOPY W/ POLYPECTOMY  2010   Diverticulosis  . EP IMPLANTABLE  DEVICE N/A 07/03/2015   Procedure: BiV Pacemaker Insertion CRT-P;  Surgeon: Evans Lance, MD;  Location: [place] [facility];  Service: Cardiovascular;  Laterality: N/A;  . EYE SURGERY Right    Cataract  . HERNIA REPAIR      There were no vitals filed for this visit.    Subjective Assessment - 05/06/20 1050    Subjective Patient is a 86 y.o. male who presents to physical therapy with c/o low back and hip pain. Patient states his back has been feeling better with steroid back. He has been walking more over the last week. He was having pain in the front of his right leg. Pain is worse with bending but it has been getting better. He can not think of anything that brought it on. He went to a chiro who did some work on his leg for a couple sessions. Pain was worse in his back after racking and picking up acorns. He goes to the gym regularly and walks. Patient is hoping to get some exercises to keep himself mobile.    Limitations House hold activities    Patient Stated Goals keep his body mobile    Currently in Pain? No/denies  [facility] PT Assessment - 05/06/20 0001      Assessment   Medical Diagnosis DDD, R hip OA    Referring Provider (PT) Arther Abbott MD    Onset Date/Surgical Date 11/04/19    Next MD Visit none scheduled    Prior Therapy none      Precautions   Precautions None      Restrictions   Weight Bearing Restrictions No      Balance Screen   Has the patient fallen in the past 6 months No    Has the patient had a decrease in activity level because of a fear of falling?  No    Is the patient reluctant to leave their home because of a fear of falling?  No      Prior Function   Level of Independence Independent    Vocation Retired    Leisure Exercise      Cognition   Overall Cognitive Status Within Functional Limits for tasks assessed      Observation/Other Assessments   Observations Ambulates without AD    Focus on Therapeutic Outcomes (FOTO)  not  completed secondary to no difficulty with ADL      ROM / Strength   AROM / PROM / Strength AROM;Strength      AROM   AROM Assessment Site Lumbar    Lumbar Flexion 10 % limited    Lumbar Extension 25% limited    Lumbar - Right Side Bend 0% limited    Lumbar - Left Side Bend 0% limited    Lumbar - Right Rotation 0% limited    Lumbar - Left Rotation 0% limited      Strength   Strength Assessment Site Hip;Knee;Ankle    Right/Left Hip Right;Left    Right Hip Flexion 4/5    Right Hip Extension 4-/5    Right Hip ABduction 4/5    Left Hip Flexion 4/5    Left Hip Extension 4-/5    Left Hip ABduction 4/5    Right/Left Knee Right;Left    Right Knee Flexion 5/5    Right Knee Extension 5/5    Left Knee Flexion 5/5    Left Knee Extension 5/5    Right/Left Ankle Right;Left    Right Ankle Dorsiflexion 5/5    Left Ankle Dorsiflexion 5/5      Special Tests   Other special tests squat: slight dynamic knee valgus and shakiness bilateral, looses balance posteriorly; foward step down test requires unilateral minimal UE support bilateral, minimally impaired eccentric control                       Objective measurements completed on examination: See above findings.               PT Education - 05/06/20 1049    Education Details Patient educated on exam findings, POC, scope of PT, initial HEP    Person(s) Educated Patient    Methods Explanation;Demonstration;Handout    Comprehension Verbalized understanding;Returned demonstration            PT Short Term Goals - 05/06/20 1150      PT SHORT TERM GOAL #1   Title Patient will be independent with HEP in order to improve functional outcomes.    Time 2    Period Weeks    Status New    Target Date 05/20/20             PT Long Term Goals - 05/06/20 1150  PT LONG TERM GOAL #1   Title Patient will report at least 75% improvement in symptoms for improved quality of life.    Time 4    Period Weeks    Status  New    Target Date 06/03/20      PT LONG TERM GOAL #2   Title Patient will be able to complete forward step down test and squat without deviation in order to demonstrate improved functional strength.    Time 4    Period Weeks    Status New    Target Date 06/03/20      PT LONG TERM GOAL #3   Title Patient will report confidence with HEP and exercie routine in order to manage symptoms/Improve strength independently.    Time 4    Period Weeks    Status New    Target Date 06/03/20                  Plan - 05/06/20 1143    Clinical Impression Statement Patient is a 86 y.o. male who presents to physical therapy with c/o low back and hip pain. He presents with pain limited deficits in Lumbar spine and hip strength, ROM, and functional mobility with ADL. He is having to modify and restrict ADL as indicated by subjective information and objective measures which is affecting overall participation. Patient will benefit from skilled physical therapy in order to improve function and reduce impairment.    Personal Factors and Comorbidities Age;Behavior Pattern    Examination-Activity Limitations Squat;Lift;Caring for Others    Examination-Participation Restrictions Lawyer;Shop;Cleaning    Stability/Clinical Decision Making Stable/Uncomplicated    Clinical Decision Making Low    Rehab Potential Good    PT Frequency 1x / week    PT Duration 4 weeks    PT Treatment/Interventions Gait training;Stair training;Functional mobility training;Therapeutic activities;Therapeutic exercise;Balance training;Neuromuscular re-education;Patient/family education;Manual techniques    PT Next Visit Plan begin LE strengthening exercises, begin lumbar and hip/knee mobility exercises    PT Home Exercise Plan 11/10 squat    Consulted and Agree with Plan of Care Patient           Patient will benefit from skilled therapeutic intervention in order to improve the following deficits and impairments:   Decreased range of motion, Pain, Decreased activity tolerance, Decreased balance, Improper body mechanics, Decreased mobility, Decreased strength  Visit Diagnosis: Low back pain, unspecified back pain laterality, unspecified chronicity, unspecified whether sciatica present  Pain in right hip  Muscle weakness (generalized)  Other abnormalities of gait and mobility     Problem List Patient Active Problem List   Diagnosis Date Noted  . Atrial fibrillation with RVR ([place]) 02/06/2020  . Systolic and diastolic CHF, chronic ([place]) 02/06/2020  . Chronic systolic heart failure ([place]) 07/03/2015  . Pacemaker 07/03/2015  . Syncope 05/20/2015  . Nonischemic cardiomyopathy ([place]) 05/20/2015  . Bradycardia   . Carotid stenosis, bilateral 11/19/2013  . Laboratory test 10/15/2011  . Hyperlipidemia 10/15/2011  . Cerebrovascular disease   . Left bundle branch block   . Cardiomyopathy, nonischemic ([place])   . CKD (chronic kidney disease) stage 3, GFR 30-59 ml/min ([facility])     11:53 AM, 05/06/20 Mearl Latin PT, DPT Physical Therapist at [place], [place], [address] Phone: [phone]   Fax:  [phone]  Name: FRANCESCO PROVENCAL MRN: [id number] Date of Birth: 02-19-1928

## 2020-05-06 NOTE — Progress Notes (Signed)
Remote pacemaker transmission.   

## 2020-05-11 ENCOUNTER — Other Ambulatory Visit: Payer: Self-pay

## 2020-05-11 ENCOUNTER — Ambulatory Visit (HOSPITAL_COMMUNITY): Payer: Medicare Other | Admitting: Physical Therapy

## 2020-05-11 ENCOUNTER — Encounter (HOSPITAL_COMMUNITY): Payer: Self-pay | Admitting: Physical Therapy

## 2020-05-11 DIAGNOSIS — M545 Low back pain, unspecified: Secondary | ICD-10-CM | POA: Diagnosis not present

## 2020-05-11 DIAGNOSIS — R2689 Other abnormalities of gait and mobility: Secondary | ICD-10-CM | POA: Diagnosis not present

## 2020-05-11 DIAGNOSIS — M6281 Muscle weakness (generalized): Secondary | ICD-10-CM | POA: Diagnosis not present

## 2020-05-11 DIAGNOSIS — M25551 Pain in right hip: Secondary | ICD-10-CM | POA: Diagnosis not present

## 2020-05-11 NOTE — Therapy (Signed)
[place], [place], [address] Phone: [phone]   Fax:  [phone]  Physical Therapy Treatment  Patient Details  Name: Max Cole MRN: [id number] Date of Birth: 10/27/1927 Referring Provider (PT): Arther Abbott MD   Encounter Date: 05/11/2020   PT End of Session - 05/11/20 0830    Visit Number 2    Number of Visits 4    Date for PT Re-Evaluation 06/03/20    Authorization Type [employer]HC medicare    PT Start Time 0830    PT Stop Time 0910    PT Time Calculation (min) 40 min    Activity Tolerance Patient tolerated treatment well    Behavior During Therapy [facility] for tasks assessed/performed           Past Medical History:  Diagnosis Date  . Atrial fibrillation ([place])   . Bradycardia   . Cardiomyopathy 2005   a. Possibly alcoholic; b. cath in 1194-17% ostial D1, PCW of 12, EF of 35-40%;  c. EF of 0.25 in 11/2003;  d. 40-45% in 05/2004;  e. 25% in 10/2008 by echo;  f. 04/2015 Echo: EF 40-45%, diff HK, sev inflat/inf HK, Gr 1 DD, mild AI, triv TR.  . Carotid artery occlusion   . Cerebrovascular disease    Carotid ultrasound in 12/2010-60-79% left internal carotid artery, less than 40% or right RICA, no change  . Chronic kidney disease    Creatinine of 1.6 in 2009  . Hyperlipidemia 10/15/2011   Lipid profile in 03/2009:231, 136, 56, 148  . Left bundle branch block   . Prostate carcinoma ([place])   . Syncope    [place] CRT-P 07/03/15 Dr. Lovena Le    Past Surgical History:  Procedure Laterality Date  . CATARACT EXTRACTION     Right  . COLONOSCOPY N/A 08/16/2012   Procedure: COLONOSCOPY;  Surgeon: Rogene Houston, MD;  [facility];  Service: Endoscopy;  Laterality: N/A;  930  . COLONOSCOPY N/A 10/21/2015   Procedure: COLONOSCOPY;  Surgeon: Rogene Houston, MD;  [facility];  Service: Endoscopy;  Laterality: N/A;  1200  . COLONOSCOPY W/ POLYPECTOMY  2010   Diverticulosis  . EP IMPLANTABLE  DEVICE N/A 07/03/2015   Procedure: BiV Pacemaker Insertion CRT-P;  Surgeon: Evans Lance, MD;  Location: [place] [facility];  Service: Cardiovascular;  Laterality: N/A;  . EYE SURGERY Right    Cataract  . HERNIA REPAIR      There were no vitals filed for this visit.   Subjective Assessment - 05/11/20 0830    Subjective Patient states he has some soreness in his low back. Leg has not started hurting again.    Limitations House hold activities    Patient Stated Goals keep his body mobile    Currently in Pain? No/denies                             [facility] Adult PT Treatment/Exercise - 05/11/20 0001      Exercises   Exercises Knee/Hip      Knee/Hip Exercises: Stretches   Quad Stretch 3 reps;20 seconds;Both    Quad Stretch Limitations prone with strap     Piriformis Stretch Both;20 seconds;3 reps      Knee/Hip Exercises: Standing   Other Standing Knee Exercises palof press 2x 10 bilateral blue band      Knee/Hip Exercises: Sidelying   Hip ABduction Both;1 set;10  reps      Knee/Hip Exercises: Prone   Straight Leg Raises Both;2 sets;10 reps                  PT Education - 05/11/20 0830    Education Details HEP, exercise mechanics    Person(s) Educated Patient    Methods Explanation;Demonstration    Comprehension Verbalized understanding;Returned demonstration            PT Short Term Goals - 05/06/20 1150      PT SHORT TERM GOAL #1   Title Patient will be independent with HEP in order to improve functional outcomes.    Time 2    Period Weeks    Status New    Target Date 05/20/20             PT Long Term Goals - 05/06/20 1150      PT LONG TERM GOAL #1   Title Patient will report at least 75% improvement in symptoms for improved quality of life.    Time 4    Period Weeks    Status New    Target Date 06/03/20      PT LONG TERM GOAL #2   Title Patient will be able to complete forward step down test and squat without deviation in  order to demonstrate improved functional strength.    Time 4    Period Weeks    Status New    Target Date 06/03/20      PT LONG TERM GOAL #3   Title Patient will report confidence with HEP and exercie routine in order to manage symptoms/Improve strength independently.    Time 4    Period Weeks    Status New    Target Date 06/03/20                 Plan - 05/11/20 0830    Clinical Impression Statement Patient with c/o anterior hip pain with piriformis stretch on RLE. Patient requires tactile and verbal cueing for mechanics and reducing lumbar motion with prone hip extension exercise secondary to impaired hip extension ROM. He also requires cueing for pelvic positioning with hip abduction with fair carry over. He tolerates palof press well for additional core strengthening and is fatigued following. He is unable to perform full second set on palof secondary to fatigue and dizziness. Patient will continue to benefit from skilled physical therapy in order to reduce impairment and improve function.    Personal Factors and Comorbidities Age;Behavior Pattern    Examination-Activity Limitations Squat;Lift;Caring for Others    Examination-Participation Restrictions Lawyer;Shop;Cleaning    Stability/Clinical Decision Making Stable/Uncomplicated    Rehab Potential Good    PT Frequency 1x / week    PT Duration 4 weeks    PT Treatment/Interventions Gait training;Stair training;Functional mobility training;Therapeutic activities;Therapeutic exercise;Balance training;Neuromuscular re-education;Patient/family education;Manual techniques    PT Next Visit Plan continue LE strengthening exercises, begin lumbar and hip/knee mobility exercises    PT Home Exercise Plan 11/10 squat 11/15 Palof, hip ext, hip abd, piriformis stretch, prone quad str    Consulted and Agree with Plan of Care Patient           Patient will benefit from skilled therapeutic intervention in order to improve the  following deficits and impairments:  Decreased range of motion, Pain, Decreased activity tolerance, Decreased balance, Improper body mechanics, Decreased mobility, Decreased strength  Visit Diagnosis: Low back pain, unspecified back pain laterality, unspecified chronicity, unspecified whether sciatica present  Pain  in right hip  Muscle weakness (generalized)  Other abnormalities of gait and mobility     Problem List Patient Active Problem List   Diagnosis Date Noted  . Atrial fibrillation with RVR ([place]) 02/06/2020  . Systolic and diastolic CHF, chronic ([place]) 02/06/2020  . Chronic systolic heart failure ([place]) 07/03/2015  . Pacemaker 07/03/2015  . Syncope 05/20/2015  . Nonischemic cardiomyopathy ([place]) 05/20/2015  . Bradycardia   . Carotid stenosis, bilateral 11/19/2013  . Laboratory test 10/15/2011  . Hyperlipidemia 10/15/2011  . Cerebrovascular disease   . Left bundle branch block   . Cardiomyopathy, nonischemic ([place])   . CKD (chronic kidney disease) stage 3, GFR 30-59 ml/min ([facility])     9:15 AM, 05/11/20 Mearl Latin PT, DPT Physical Therapist at [place], [place], [address] Phone: [phone]   Fax:  [phone]  Name: Max Cole MRN: [id number] Date of Birth: 05-11-1928

## 2020-05-11 NOTE — Patient Instructions (Signed)
Access Code: [email or web address] URL: [email or web address] Date: 05/11/2020 Prepared by: Mitzi Hansen Khyan Oats  Exercises Hooklying Single Knee to Chest Stretch - 1 x daily - 7 x weekly - 3 reps - 20 second hold Prone Quadriceps Stretch with Strap - 1 x daily - 7 x weekly - 3 reps - 20 second hold Prone Hip Extension - 1 x daily - 7 x weekly - 2 sets - 10 reps Sidelying Hip Abduction - 1 x daily - 7 x weekly - 2 sets - 10 reps Standing Anti-Rotation Press with Anchored Resistance - 1 x daily - 7 x weekly - 2 sets - 10 reps

## 2020-05-22 ENCOUNTER — Emergency Department (HOSPITAL_COMMUNITY): Payer: Medicare Other

## 2020-05-22 ENCOUNTER — Inpatient Hospital Stay (HOSPITAL_COMMUNITY)
Admission: EM | Admit: 2020-05-22 | Discharge: 2020-06-03 | DRG: 189 | Disposition: A | Discharge status: Skilled Nursing Facility | Payer: Medicare Other | Attending: Internal Medicine | Admitting: Internal Medicine

## 2020-05-22 ENCOUNTER — Encounter (HOSPITAL_COMMUNITY): Payer: Self-pay

## 2020-05-22 ENCOUNTER — Other Ambulatory Visit: Payer: Self-pay

## 2020-05-22 DIAGNOSIS — R531 Weakness: Secondary | ICD-10-CM | POA: Diagnosis not present

## 2020-05-22 DIAGNOSIS — I428 Other cardiomyopathies: Secondary | ICD-10-CM | POA: Diagnosis not present

## 2020-05-22 DIAGNOSIS — J181 Lobar pneumonia, unspecified organism: Secondary | ICD-10-CM

## 2020-05-22 DIAGNOSIS — E785 Hyperlipidemia, unspecified: Secondary | ICD-10-CM | POA: Diagnosis present

## 2020-05-22 DIAGNOSIS — I130 Hypertensive heart and chronic kidney disease with heart failure and stage 1 through stage 4 chronic kidney disease, or unspecified chronic kidney disease: Secondary | ICD-10-CM | POA: Diagnosis not present

## 2020-05-22 DIAGNOSIS — J189 Pneumonia, unspecified organism: Secondary | ICD-10-CM | POA: Diagnosis present

## 2020-05-22 DIAGNOSIS — J9601 Acute respiratory failure with hypoxia: Principal | ICD-10-CM | POA: Diagnosis present

## 2020-05-22 DIAGNOSIS — M13851 Other specified arthritis, right hip: Secondary | ICD-10-CM | POA: Diagnosis not present

## 2020-05-22 DIAGNOSIS — Z66 Do not resuscitate: Secondary | ICD-10-CM | POA: Diagnosis not present

## 2020-05-22 DIAGNOSIS — Y929 Unspecified place or not applicable: Secondary | ICD-10-CM | POA: Diagnosis not present

## 2020-05-22 DIAGNOSIS — I482 Chronic atrial fibrillation, unspecified: Secondary | ICD-10-CM | POA: Diagnosis not present

## 2020-05-22 DIAGNOSIS — N183 Chronic kidney disease, stage 3 unspecified: Secondary | ICD-10-CM | POA: Diagnosis present

## 2020-05-22 DIAGNOSIS — Z9841 Cataract extraction status, right eye: Secondary | ICD-10-CM

## 2020-05-22 DIAGNOSIS — I6529 Occlusion and stenosis of unspecified carotid artery: Secondary | ICD-10-CM | POA: Diagnosis not present

## 2020-05-22 DIAGNOSIS — Z87891 Personal history of nicotine dependence: Secondary | ICD-10-CM

## 2020-05-22 DIAGNOSIS — I6522 Occlusion and stenosis of left carotid artery: Secondary | ICD-10-CM | POA: Diagnosis not present

## 2020-05-22 DIAGNOSIS — I447 Left bundle-branch block, unspecified: Secondary | ICD-10-CM | POA: Diagnosis present

## 2020-05-22 DIAGNOSIS — R262 Difficulty in walking, not elsewhere classified: Secondary | ICD-10-CM | POA: Diagnosis not present

## 2020-05-22 DIAGNOSIS — Z9181 History of falling: Secondary | ICD-10-CM | POA: Diagnosis not present

## 2020-05-22 DIAGNOSIS — T462X5A Adverse effect of other antidysrhythmic drugs, initial encounter: Secondary | ICD-10-CM | POA: Diagnosis not present

## 2020-05-22 DIAGNOSIS — Z79899 Other long term (current) drug therapy: Secondary | ICD-10-CM

## 2020-05-22 DIAGNOSIS — I5022 Chronic systolic (congestive) heart failure: Secondary | ICD-10-CM | POA: Diagnosis present

## 2020-05-22 DIAGNOSIS — I5042 Chronic combined systolic (congestive) and diastolic (congestive) heart failure: Secondary | ICD-10-CM | POA: Diagnosis present

## 2020-05-22 DIAGNOSIS — N400 Enlarged prostate without lower urinary tract symptoms: Secondary | ICD-10-CM | POA: Diagnosis present

## 2020-05-22 DIAGNOSIS — Z20822 Contact with and (suspected) exposure to covid-19: Secondary | ICD-10-CM | POA: Diagnosis present

## 2020-05-22 DIAGNOSIS — Z8546 Personal history of malignant neoplasm of prostate: Secondary | ICD-10-CM

## 2020-05-22 DIAGNOSIS — R079 Chest pain, unspecified: Secondary | ICD-10-CM | POA: Diagnosis not present

## 2020-05-22 DIAGNOSIS — Z515 Encounter for palliative care: Secondary | ICD-10-CM | POA: Diagnosis not present

## 2020-05-22 DIAGNOSIS — R509 Fever, unspecified: Secondary | ICD-10-CM | POA: Diagnosis not present

## 2020-05-22 DIAGNOSIS — J9690 Respiratory failure, unspecified, unspecified whether with hypoxia or hypercapnia: Secondary | ICD-10-CM | POA: Diagnosis not present

## 2020-05-22 DIAGNOSIS — J9691 Respiratory failure, unspecified with hypoxia: Secondary | ICD-10-CM | POA: Diagnosis present

## 2020-05-22 DIAGNOSIS — Z7901 Long term (current) use of anticoagulants: Secondary | ICD-10-CM

## 2020-05-22 DIAGNOSIS — R9431 Abnormal electrocardiogram [ECG] [EKG]: Secondary | ICD-10-CM | POA: Diagnosis not present

## 2020-05-22 DIAGNOSIS — Z950 Presence of cardiac pacemaker: Secondary | ICD-10-CM | POA: Diagnosis not present

## 2020-05-22 DIAGNOSIS — M5137 Other intervertebral disc degeneration, lumbosacral region: Secondary | ICD-10-CM | POA: Diagnosis not present

## 2020-05-22 DIAGNOSIS — W1830XA Fall on same level, unspecified, initial encounter: Secondary | ICD-10-CM | POA: Diagnosis not present

## 2020-05-22 DIAGNOSIS — I4891 Unspecified atrial fibrillation: Secondary | ICD-10-CM | POA: Diagnosis not present

## 2020-05-22 DIAGNOSIS — J9611 Chronic respiratory failure with hypoxia: Secondary | ICD-10-CM | POA: Diagnosis not present

## 2020-05-22 DIAGNOSIS — N1832 Chronic kidney disease, stage 3b: Secondary | ICD-10-CM

## 2020-05-22 DIAGNOSIS — Z8249 Family history of ischemic heart disease and other diseases of the circulatory system: Secondary | ICD-10-CM

## 2020-05-22 DIAGNOSIS — M6281 Muscle weakness (generalized): Secondary | ICD-10-CM | POA: Diagnosis not present

## 2020-05-22 DIAGNOSIS — Z7189 Other specified counseling: Secondary | ICD-10-CM | POA: Diagnosis not present

## 2020-05-22 DIAGNOSIS — R0600 Dyspnea, unspecified: Secondary | ICD-10-CM | POA: Diagnosis not present

## 2020-05-22 DIAGNOSIS — J984 Other disorders of lung: Secondary | ICD-10-CM | POA: Diagnosis not present

## 2020-05-22 DIAGNOSIS — R0902 Hypoxemia: Secondary | ICD-10-CM | POA: Diagnosis not present

## 2020-05-22 DIAGNOSIS — I480 Paroxysmal atrial fibrillation: Secondary | ICD-10-CM | POA: Diagnosis not present

## 2020-05-22 DIAGNOSIS — T50905A Adverse effect of unspecified drugs, medicaments and biological substances, initial encounter: Secondary | ICD-10-CM | POA: Diagnosis not present

## 2020-05-22 DIAGNOSIS — J9621 Acute and chronic respiratory failure with hypoxia: Secondary | ICD-10-CM | POA: Diagnosis not present

## 2020-05-22 DIAGNOSIS — Z741 Need for assistance with personal care: Secondary | ICD-10-CM | POA: Diagnosis not present

## 2020-05-22 DIAGNOSIS — R0602 Shortness of breath: Secondary | ICD-10-CM | POA: Diagnosis not present

## 2020-05-22 DIAGNOSIS — I517 Cardiomegaly: Secondary | ICD-10-CM | POA: Diagnosis not present

## 2020-05-22 DIAGNOSIS — R0603 Acute respiratory distress: Secondary | ICD-10-CM

## 2020-05-22 DIAGNOSIS — N281 Cyst of kidney, acquired: Secondary | ICD-10-CM | POA: Diagnosis not present

## 2020-05-22 LAB — CBC WITH DIFFERENTIAL/PLATELET
Abs Immature Granulocytes: 0.1 10*3/uL — ABNORMAL HIGH (ref 0.00–0.07)
Basophils Absolute: 0.1 10*3/uL (ref 0.0–0.1)
Basophils Relative: 1 %
Eosinophils Absolute: 0.2 10*3/uL (ref 0.0–0.5)
Eosinophils Relative: 2 %
HCT: 34.4 % — ABNORMAL LOW (ref 39.0–52.0)
Hemoglobin: 11.3 g/dL — ABNORMAL LOW (ref 13.0–17.0)
Immature Granulocytes: 1 %
Lymphocytes Relative: 8 %
Lymphs Abs: 0.8 10*3/uL (ref 0.7–4.0)
MCH: 32.4 pg (ref 26.0–34.0)
MCHC: 32.8 g/dL (ref 30.0–36.0)
MCV: 98.6 fL (ref 80.0–100.0)
Monocytes Absolute: 1 10*3/uL (ref 0.1–1.0)
Monocytes Relative: 10 %
Neutro Abs: 7.7 10*3/uL (ref 1.7–7.7)
Neutrophils Relative %: 78 %
Platelets: 343 10*3/uL (ref 150–400)
RBC: 3.49 MIL/uL — ABNORMAL LOW (ref 4.22–5.81)
RDW: 12.7 % (ref 11.5–15.5)
WBC: 9.9 10*3/uL (ref 4.0–10.5)
nRBC: 0 % (ref 0.0–0.2)

## 2020-05-22 LAB — URINALYSIS, ROUTINE W REFLEX MICROSCOPIC
Bacteria, UA: NONE SEEN
Bilirubin Urine: NEGATIVE
Glucose, UA: NEGATIVE mg/dL
Hgb urine dipstick: NEGATIVE
Ketones, ur: NEGATIVE mg/dL
Leukocytes,Ua: NEGATIVE
Nitrite: NEGATIVE
Protein, ur: 30 mg/dL — AB
Specific Gravity, Urine: 1.015 (ref 1.005–1.030)
pH: 6 (ref 5.0–8.0)

## 2020-05-22 LAB — COMPREHENSIVE METABOLIC PANEL
ALT: 18 U/L (ref 0–44)
AST: 22 U/L (ref 15–41)
Albumin: 2.7 g/dL — ABNORMAL LOW (ref 3.5–5.0)
Alkaline Phosphatase: 70 U/L (ref 38–126)
Anion gap: 9 (ref 5–15)
BUN: 21 mg/dL (ref 8–23)
CO2: 27 mmol/L (ref 22–32)
Calcium: 8.5 mg/dL — ABNORMAL LOW (ref 8.9–10.3)
Chloride: 98 mmol/L (ref 98–111)
Creatinine, Ser: 1.72 mg/dL — ABNORMAL HIGH (ref 0.61–1.24)
GFR, Estimated: 37 mL/min — ABNORMAL LOW (ref >60)
Glucose, Bld: 101 mg/dL — ABNORMAL HIGH (ref 70–99)
Potassium: 3.9 mmol/L (ref 3.5–5.1)
Sodium: 134 mmol/L — ABNORMAL LOW (ref 135–145)
Total Bilirubin: 0.5 mg/dL (ref 0.3–1.2)
Total Protein: 6.8 g/dL (ref 6.5–8.1)

## 2020-05-22 LAB — RESP PANEL BY RT-PCR (FLU A&B, COVID) ARPGX2
Influenza A by PCR: NEGATIVE
Influenza B by PCR: NEGATIVE
SARS Coronavirus 2 by RT PCR: NEGATIVE

## 2020-05-22 LAB — LACTIC ACID, PLASMA: Lactic Acid, Venous: 1.3 mmol/L (ref 0.5–1.9)

## 2020-05-22 LAB — TROPONIN I (HIGH SENSITIVITY)
Troponin I (High Sensitivity): 15 ng/L (ref <18)
Troponin I (High Sensitivity): 15 ng/L (ref <18)

## 2020-05-22 LAB — D-DIMER, QUANTITATIVE: D-Dimer, Quant: 1.58 ug/mL-FEU — ABNORMAL HIGH (ref 0.00–0.50)

## 2020-05-22 MED ORDER — SODIUM CHLORIDE 0.9 % IV SOLN
500.0000 mg | Freq: Once | INTRAVENOUS | Status: AC
  Administered 2020-05-22: 500 mg via INTRAVENOUS
  Filled 2020-05-22: qty 500

## 2020-05-22 MED ORDER — SODIUM CHLORIDE 0.9% FLUSH
3.0000 mL | Freq: Two times a day (BID) | INTRAVENOUS | Status: DC
  Administered 2020-05-22 – 2020-06-03 (×18): 3 mL via INTRAVENOUS

## 2020-05-22 MED ORDER — ONDANSETRON HCL 4 MG/2ML IJ SOLN: 4.0000 mg | Freq: Four times a day (QID) | INTRAMUSCULAR | Status: DC | PRN

## 2020-05-22 MED ORDER — ONDANSETRON HCL 4 MG PO TABS: 4.0000 mg | ORAL_TABLET | Freq: Four times a day (QID) | ORAL | Status: DC | PRN

## 2020-05-22 MED ORDER — TRAZODONE HCL 50 MG PO TABS
50.0000 mg | ORAL_TABLET | Freq: Every evening | ORAL | Status: DC | PRN
  Administered 2020-05-24 – 2020-05-31 (×3): 50 mg via ORAL
  Filled 2020-05-22 (×5): qty 1

## 2020-05-22 MED ORDER — SODIUM CHLORIDE 0.9% FLUSH
3.0000 mL | Freq: Two times a day (BID) | INTRAVENOUS | Status: DC
  Administered 2020-05-22 – 2020-06-03 (×9): 3 mL via INTRAVENOUS

## 2020-05-22 MED ORDER — BISACODYL 10 MG RE SUPP
10.0000 mg | Freq: Every day | RECTAL | Status: DC | PRN
  Administered 2020-05-24 – 2020-06-01 (×2): 10 mg via RECTAL
  Filled 2020-05-22 (×3): qty 1

## 2020-05-22 MED ORDER — SODIUM CHLORIDE 0.9 % IV SOLN
500.0000 mg | INTRAVENOUS | Status: DC
  Administered 2020-05-23 – 2020-05-26 (×4): 500 mg via INTRAVENOUS
  Filled 2020-05-22 (×4): qty 500

## 2020-05-22 MED ORDER — ALBUTEROL SULFATE (2.5 MG/3ML) 0.083% IN NEBU
2.5000 mg | INHALATION_SOLUTION | RESPIRATORY_TRACT | Status: DC | PRN
  Administered 2020-05-25: 2.5 mg via RESPIRATORY_TRACT
  Filled 2020-05-22: qty 3

## 2020-05-22 MED ORDER — GUAIFENESIN ER 600 MG PO TB12
600.0000 mg | ORAL_TABLET | Freq: Two times a day (BID) | ORAL | Status: DC
  Administered 2020-05-22 – 2020-06-03 (×24): 600 mg via ORAL
  Filled 2020-05-22 (×24): qty 1

## 2020-05-22 MED ORDER — AMIODARONE HCL 200 MG PO TABS
200.0000 mg | ORAL_TABLET | Freq: Every day | ORAL | Status: DC
  Administered 2020-05-22 – 2020-05-25 (×4): 200 mg via ORAL
  Filled 2020-05-22 (×4): qty 1

## 2020-05-22 MED ORDER — CARVEDILOL 3.125 MG PO TABS
3.1250 mg | ORAL_TABLET | Freq: Two times a day (BID) | ORAL | Status: DC
  Administered 2020-05-23 – 2020-05-28 (×12): 3.125 mg via ORAL
  Filled 2020-05-22 (×12): qty 1

## 2020-05-22 MED ORDER — POLYETHYLENE GLYCOL 3350 17 G PO PACK
17.0000 g | PACK | Freq: Every day | ORAL | Status: DC | PRN
  Filled 2020-05-22: qty 1

## 2020-05-22 MED ORDER — PRAVASTATIN SODIUM 10 MG PO TABS
20.0000 mg | ORAL_TABLET | Freq: Every evening | ORAL | Status: DC
  Administered 2020-05-22 – 2020-06-02 (×12): 20 mg via ORAL
  Filled 2020-05-22 (×13): qty 2

## 2020-05-22 MED ORDER — SODIUM CHLORIDE 0.9 % IV BOLUS
500.0000 mL | Freq: Once | INTRAVENOUS | Status: AC
  Administered 2020-05-22: 500 mL via INTRAVENOUS

## 2020-05-22 MED ORDER — APIXABAN 2.5 MG PO TABS
2.5000 mg | ORAL_TABLET | Freq: Two times a day (BID) | ORAL | Status: DC
  Administered 2020-05-22 – 2020-06-03 (×24): 2.5 mg via ORAL
  Filled 2020-05-22 (×24): qty 1

## 2020-05-22 MED ORDER — METHOCARBAMOL 500 MG PO TABS
500.0000 mg | ORAL_TABLET | Freq: Three times a day (TID) | ORAL | Status: DC
  Administered 2020-05-22 – 2020-06-03 (×35): 500 mg via ORAL
  Filled 2020-05-22 (×35): qty 1

## 2020-05-22 MED ORDER — SODIUM CHLORIDE 0.9 % IV SOLN: 250.0000 mL | INTRAVENOUS | Status: DC | PRN

## 2020-05-22 MED ORDER — FERROUS SULFATE 325 (65 FE) MG PO TABS
325.0000 mg | ORAL_TABLET | Freq: Every day | ORAL | Status: DC
  Administered 2020-05-23 – 2020-06-03 (×12): 325 mg via ORAL
  Filled 2020-05-22 (×12): qty 1

## 2020-05-22 MED ORDER — ACETAMINOPHEN 325 MG PO TABS
650.0000 mg | ORAL_TABLET | Freq: Four times a day (QID) | ORAL | Status: DC | PRN
  Administered 2020-05-23: 650 mg via ORAL
  Filled 2020-05-22: qty 2

## 2020-05-22 MED ORDER — SODIUM CHLORIDE 0.9% FLUSH
3.0000 mL | INTRAVENOUS | Status: DC | PRN
  Administered 2020-05-23: 3 mL via INTRAVENOUS

## 2020-05-22 MED ORDER — TAMSULOSIN HCL 0.4 MG PO CAPS
0.4000 mg | ORAL_CAPSULE | Freq: Every day | ORAL | Status: DC
  Administered 2020-05-22 – 2020-06-03 (×13): 0.4 mg via ORAL
  Filled 2020-05-22 (×13): qty 1

## 2020-05-22 MED ORDER — IOHEXOL 350 MG/ML SOLN
75.0000 mL | Freq: Once | INTRAVENOUS | Status: AC | PRN
  Administered 2020-05-22: 75 mL via INTRAVENOUS

## 2020-05-22 MED ORDER — IPRATROPIUM-ALBUTEROL 0.5-2.5 (3) MG/3ML IN SOLN
3.0000 mL | Freq: Four times a day (QID) | RESPIRATORY_TRACT | Status: DC
  Administered 2020-05-22: 3 mL via RESPIRATORY_TRACT
  Filled 2020-05-22: qty 3

## 2020-05-22 MED ORDER — ACETAMINOPHEN 650 MG RE SUPP: 650.0000 mg | Freq: Four times a day (QID) | RECTAL | Status: DC | PRN

## 2020-05-22 NOTE — ED Notes (Signed)
Daughter called for update

## 2020-05-22 NOTE — H&P (Signed)
Patient Demographics:    Max Cole, is a 86 y.o. male  MRN: [id number]   DOB - [birth year]  Admit Date - 05/22/2020  Outpatient Primary MD for the patient is Max Noble, MD   Assessment & Plan:    Principal Problem:   Respiratory failure with hypoxia [facility]) Active Problems:   Pneumonia in infectious disease---??? Atypical Pneumonia   CKD (chronic kidney disease) stage 3, GFR 30-59 ml/min (HCC)   Pacemaker   Systolic and diastolic CHF, chronic (HCC)   Chronic a-fib (HCC)   Hyperlipidemia   Chronic systolic heart failure (HCC)   1)Acute Hypoxic Resp Failure --?? Due to Atypical PNA/Pneumonitis and chronic CHF--give supplemental oxygen, treat empirically with azithromycin mucolytics and bronchodilators -Hold off on steroids -Covid Neg and Pt completed Vaccinations--- -CTA chest negative for PE  2)Atypical Pneumonia/Pneumonitis--- management as above #1, please see CTA chest report -No fevers or leukocytosis, lactic acid is not elevated  3)CKD stage 3B -   creatinine is 1.72 which is close to patient's baseline -- renally adjust medications, avoid nephrotoxic agents / dehydration  / hypotension  4)Chronic Afib/H/o CHB-----s/p [place] CRT-P/ s/p BiV PPM in 06/2015- --CHADSVASc = 5 -Continue Eliquis for anticoagulation, continue amiodarone 200 mg daily and Coreg 3.125 mg twice daily for rate control  5)HFpEF/Chronic Combined Systolic and Diastolic Dysfunction CHF--- echo from 02/07/20--- with EF 50 to 55%, with Grade 1 Diastolic dysfunction-- -overall appears mostly compensated at this time -Hold off on diuretics --Troponin is not elevated -Continue Coreg  6)BPH-stable, continue Flomax  7) carotid artery stenosis/PAD--- already on Eliquis so would not add aspirin, continue pravastatin  8) chronic  anemia--- hemoglobin is 11.2 which is close to patient's baseline, suspect anemia is partly related to CKD  9) social/ethics--plan of care and advanced directive discussed with patient and daughter at bedside, patient is a DNR/DNI with full scope of treatment -Patient's daughter/POA is a Max Cole  Disposition/Need for in-Hospital Stay- patient unable to be discharged at this time due to --pneumonia with hypoxia requiring IV antibiotics bronchodilators and supplemental oxygen  Status is: Inpatient  Remains inpatient appropriate because:pneumonia with hypoxia requiring IV antibiotics bronchodilators and supplemental oxygen  Dispo: The patient is from: Home              Anticipated d/c is to: Home              Anticipated d/c date is: 2 days              Patient currently is not medically stable to d/c. Barriers: Not Clinically Stable- pneumonia with hypoxia requiring IV antibiotics bronchodilators and supplemental oxygen  With History of - Reviewed by me  Past Medical History:  Diagnosis Date  . Atrial fibrillation ([place])   . Bradycardia   . Cardiomyopathy 2005   a. Possibly alcoholic; b. cath in 6945-03% ostial D1, PCW of 12, EF of 35-40%;  c. EF of 0.25 in 11/2003;  d.  40-45% in 05/2004;  e. 25% in 10/2008 by echo;  f. 04/2015 Echo: EF 40-45%, diff HK, sev inflat/inf HK, Gr 1 DD, mild AI, triv TR.  . Carotid artery occlusion   . Cerebrovascular disease    Carotid ultrasound in 12/2010-60-79% left internal carotid artery, less than 40% or right RICA, no change  . Chronic kidney disease    Creatinine of 1.6 in 2009  . Hyperlipidemia 10/15/2011   Lipid profile in 03/2009:231, 136, 56, 148  . Left bundle branch block   . Prostate carcinoma ([place])   . Syncope    [place] CRT-P 07/03/15 Max Cole      Past Surgical History:  Procedure Laterality Date  . CATARACT EXTRACTION     Right  . COLONOSCOPY N/A 08/16/2012   Procedure: COLONOSCOPY;  Surgeon: Max Houston, MD;  [facility];  Service: Endoscopy;  Laterality: N/A;  930  . COLONOSCOPY N/A 10/21/2015   Procedure: COLONOSCOPY;  Surgeon: Max Houston, MD;  Location: [facility];  Service: Endoscopy;  Laterality: N/A;  1200  . COLONOSCOPY W/ POLYPECTOMY  2010   Diverticulosis  . EP IMPLANTABLE DEVICE N/A 07/03/2015   Procedure: BiV Pacemaker Insertion CRT-P;  Surgeon: Max Lance, MD;  Location: [facility];  Service: Cardiovascular;  Laterality: N/A;  . EYE SURGERY Right    Cataract  . HERNIA REPAIR        Chief Complaint  Patient presents with  . Fever      HPI:    Max Cole  is a 86 y.o. male with PMHx relevant for chronic Afib, CHB (s/p [place] CRT-P/ s/p BiV PPM in 06/2015),HTN, HLD, carotid stenosis, BPH, QPY1P and Diastolic CHF and chronic Anemia-- presents with 10 day h/o weakness, shob, DOE and found to be hypoxic with oxy sats of 80 % ----- Covid Neg and Pt completed Vaccinations--- Troponin is 15, D- Dimer is 1.58 -CTA chest ---Negative for pulmonary embolus, Widespread abnormal although nonspecific pulmonary ground-glass, opacity with trace layering pleural fluid. Acute viral/atypical respiratory infection not excluded. Chronic interstitial lung disease is also a consideration. ---Cardiomegaly with mild to moderate enlargement of the central pulmonary arteries raising the possibility of pulmonary artery hypertension.   Wbc 9.9, Hgb is 11.3, Na is 134, Lactic acid 1.3, LFTs -WNL -- Creatinine is 1.72    Review of systems:    In addition to the HPI above,   A full Review of  Systems was done, all other systems reviewed are negative except as noted above in HPI , .   Social History:  Reviewed by me    Social History   Tobacco Use  . Smoking status: Former Smoker    Packs/day: 1.00    Years: 30.00    Pack years: 30.00    Quit date: 09/28/1973    Years since quitting: 46.6  . Smokeless tobacco: Never Used  Substance Use Topics  . Alcohol use: Yes     Alcohol/week: 5.0 standard drinks    Types: 5 Glasses of wine per week    Comment: History of excessive alcohol use; Alternates between glass of wine vs. Liquor drink 5 days per week (04/2015)     Family History :  Reviewed by me    Family History  Problem Relation Age of Onset  . Hypertension Mother      Home Medications:   Prior to Admission medications   Medication Sig Start Date End Date Taking? Authorizing Provider  amiodarone (PACERONE)  200 MG tablet Take 1 tablet (200 mg total) by mouth daily. 02/29/20 05/22/20 Yes Shah, Pratik D, DO  apixaban (ELIQUIS) 2.5 MG TABS tablet Take 1 tablet (2.5 mg total) by mouth 2 (two) times daily. 02/07/20  Yes Shah, Pratik D, DO  carvedilol (COREG) 3.125 MG tablet TAKE (1) TABLET BY MOUTH TWICE DAILY WITH A MEAL. Patient taking differently: Take 3.125 mg by mouth 2 (two) times daily with a meal.  04/16/20   Max Lance, MD  ferrous sulfate 325 (65 FE) MG tablet Take 325 mg by mouth daily with breakfast.    [provider]  methocarbamol (ROBAXIN) 500 MG tablet Take 1 tablet (500 mg total) by mouth 3 (three) times daily. 04/27/20   Carole Civil, MD  oseltamivir (TAMIFLU) 75 MG capsule Take 75 mg by mouth 2 (two) times daily. 05/18/20   [provider]  pravastatin (PRAVACHOL) 20 MG tablet Take 1 tablet (20 mg total) by mouth every evening. 04/17/20   Max Lance, MD  predniSONE (STERAPRED UNI-PAK 48 TAB) 10 MG (48) TBPK tablet Take by mouth daily. 04/27/20   Carole Civil, MD  tamsulosin (FLOMAX) 0.4 MG CAPS capsule Take 0.4 mg by mouth daily. 04/17/20   [provider]     Allergies:    No Known Allergies   Physical Exam:   Vitals  Blood pressure 135/87, pulse 80, temperature 98.9 F (37.2 C), temperature source Oral, resp. rate 20, height 6\' 1"  (1.854 m), weight 80.7 kg, SpO2 94 %.  Physical Examination: General appearance - alert,  and in no distress  Mental status - alert, oriented to  person, place, and time, Eyes - sclera anicteric Nose- Orient 2L/min Neck - supple, no JVD elevation , Chest -diminished in bases, few scattered rhonchi, no wheeze Heart - S1 and S2 normal, regular  Abdomen - soft, nontender, nondistended, no CVA tenderness Neurological - screening mental status exam normal, neck supple without rigidity, cranial nerves II through XII intact, DTR's normal and symmetric Extremities - no significant pedal edema noted, intact peripheral pulses  Skin - warm, dry     Data Review:    CBC Recent Labs  Lab 05/22/20 1052  WBC 9.9  HGB 11.3*  HCT 34.4*  PLT 343  MCV 98.6  MCH 32.4  MCHC 32.8  RDW 12.7  LYMPHSABS 0.8  MONOABS 1.0  EOSABS 0.2  BASOSABS 0.1   ------------------------------------------------------------------------------------------------------------------  Chemistries  Recent Labs  Lab 05/22/20 1052  NA 134*  K 3.9  CL 98  CO2 27  GLUCOSE 101*  BUN 21  CREATININE 1.72*  CALCIUM 8.5*  AST 22  ALT 18  ALKPHOS 70  BILITOT 0.5   ------------------------------------------------------------------------------------------------------------------ estimated creatinine clearance is 32.3 mL/min (A) (by C-G formula based on SCr of 1.72 mg/dL (H)). ------------------------------------------------------------------------------------------------------------------ No results for input(s): TSH, T4TOTAL, T3FREE, THYROIDAB in the last 72 hours.  Invalid input(s): FREET3   Coagulation profile No results for input(s): INR, PROTIME in the last 168 hours. ------------------------------------------------------------------------------------------------------------------- Recent Labs    05/22/20 1052  DDIMER 1.58*   -------------------------------------------------------------------------------------------------------------------  Cardiac Enzymes No results for input(s): CKMB, TROPONINI, MYOGLOBIN in the last 168 hours.  Invalid input(s):  CK ------------------------------------------------------------------------------------------------------------------ No results found for: BNP   ---------------------------------------------------------------------------------------------------------------  Urinalysis    Component Value Date/Time   COLORURINE YELLOW 05/22/2020 [place] 05/22/2020 1041   LABSPEC 1.015 05/22/2020 [place] 6.0 05/22/2020 [place] 05/22/2020 1041   GLUCOSEU neg 02/26/2009  0000   HGBUR NEGATIVE 05/22/2020 [place] 05/22/2020 [place] 05/22/2020 1041   PROTEINUR 30 (A) 05/22/2020 1041   NITRITE NEGATIVE 05/22/2020 1041   LEUKOCYTESUR NEGATIVE 05/22/2020 1041    ----------------------------------------------------------------------------------------------------------------   Imaging Results:    CT Angio Chest PE W and/or Wo Contrast  Result Date: 05/22/2020 CLINICAL DATA:  86 year old male with chest pain. Weakness. Shortness of breath. Abnormal D-dimer. EXAM: CT ANGIOGRAPHY CHEST WITH CONTRAST TECHNIQUE: Multidetector CT imaging of the chest was performed using the standard protocol during bolus administration of intravenous contrast. Multiplanar CT image reconstructions and MIPs were obtained to evaluate the vascular anatomy. CONTRAST:  62mL OMNIPAQUE IOHEXOL 350 MG/ML SOLN COMPARISON:  Portable chest earlier today. Chest radiographs 07/09/2015. Neck CT, CTA 07/03/2017 and earlier. FINDINGS: Cardiovascular: Good contrast bolus timing in the pulmonary arterial tree. No focal filling defect identified in the pulmonary arteries to suggest acute pulmonary embolism. Mild to moderate enlargement of central pulmonary arteries relative to the aorta (series 3, image 147). Calcified aortic atherosclerosis. Calcified coronary artery atherosclerosis. Cardiomegaly. Left chest cardiac pacemaker type device. No pericardial effusion.  Mediastinum/Nodes: Negative.  No lymphadenopathy. Lungs/Pleura: Major airways are patent. Widespread although fairly symmetric peripheral, peribronchial, and also in the lung apices this is new or progressed since 2019. No consolidation. Trace layering pleural fluid greater on the left. And dependent pulmonary ground-glass opacity in both lungs Upper Abdomen: Negative for age visible upper abdominal viscera Musculoskeletal: Chronic T12 superior endplate compression suspected to be stable since 2017 radiographs. No acute osseous abnormality identified. Review of the MIP images confirms the above findings. IMPRESSION: 1. Negative for pulmonary embolus. 2. Widespread abnormal although nonspecific pulmonary ground-glass opacity with trace layering pleural fluid. Acute viral/atypical respiratory infection not excluded. Chronic interstitial lung disease is also a consideration. 3. Cardiomegaly with mild to moderate enlargement of the central pulmonary arteries raising the possibility of pulmonary artery hypertension. Aortic Atherosclerosis (ICD10-I70.0). Electronically Signed   By: Genevie Ann M.D.   On: 05/22/2020 14:38   DG Chest Port 1 View  Result Date: 05/22/2020 CLINICAL DATA:  Weakness and shortness of breath. EXAM: PORTABLE CHEST 1 VIEW COMPARISON:  Chest x-ray 07/09/2015. FINDINGS: AICD in stable position. Cardiomegaly with mild bilateral interstitial prominence suggesting interstitial edema. Pneumonitis cannot be excluded. No pleural effusion or pneumothorax. IMPRESSION: AICD in stable position. Cardiomegaly with mild bilateral interstitial prominence suggesting interstitial edema. Pneumonitis cannot be excluded. Electronically Signed   By: Marcello Moores  Register   On: 05/22/2020 11:10    Radiological Exams on Admission: CT Angio Chest PE W and/or Wo Contrast  Result Date: 05/22/2020 CLINICAL DATA:  86 year old male with chest pain. Weakness. Shortness of breath. Abnormal D-dimer. EXAM: CT ANGIOGRAPHY CHEST  WITH CONTRAST TECHNIQUE: Multidetector CT imaging of the chest was performed using the standard protocol during bolus administration of intravenous contrast. Multiplanar CT image reconstructions and MIPs were obtained to evaluate the vascular anatomy. CONTRAST:  44mL OMNIPAQUE IOHEXOL 350 MG/ML SOLN COMPARISON:  Portable chest earlier today. Chest radiographs 07/09/2015. Neck CT, CTA 07/03/2017 and earlier. FINDINGS: Cardiovascular: Good contrast bolus timing in the pulmonary arterial tree. No focal filling defect identified in the pulmonary arteries to suggest acute pulmonary embolism. Mild to moderate enlargement of central pulmonary arteries relative to the aorta (series 3, image 147). Calcified aortic atherosclerosis. Calcified coronary artery atherosclerosis. Cardiomegaly. Left chest cardiac pacemaker type device. No pericardial effusion. Mediastinum/Nodes: Negative.  No lymphadenopathy. Lungs/Pleura: Major airways are patent. Widespread although fairly symmetric peripheral, peribronchial, and  also in the lung apices this is new or progressed since 2019. No consolidation. Trace layering pleural fluid greater on the left. And dependent pulmonary ground-glass opacity in both lungs Upper Abdomen: Negative for age visible upper abdominal viscera Musculoskeletal: Chronic T12 superior endplate compression suspected to be stable since 2017 radiographs. No acute osseous abnormality identified. Review of the MIP images confirms the above findings. IMPRESSION: 1. Negative for pulmonary embolus. 2. Widespread abnormal although nonspecific pulmonary ground-glass opacity with trace layering pleural fluid. Acute viral/atypical respiratory infection not excluded. Chronic interstitial lung disease is also a consideration. 3. Cardiomegaly with mild to moderate enlargement of the central pulmonary arteries raising the possibility of pulmonary artery hypertension. Aortic Atherosclerosis (ICD10-I70.0). Electronically Signed   By:  Genevie Ann M.D.   On: 05/22/2020 14:38   DG Chest Port 1 View  Result Date: 05/22/2020 CLINICAL DATA:  Weakness and shortness of breath. EXAM: PORTABLE CHEST 1 VIEW COMPARISON:  Chest x-ray 07/09/2015. FINDINGS: AICD in stable position. Cardiomegaly with mild bilateral interstitial prominence suggesting interstitial edema. Pneumonitis cannot be excluded. No pleural effusion or pneumothorax. IMPRESSION: AICD in stable position. Cardiomegaly with mild bilateral interstitial prominence suggesting interstitial edema. Pneumonitis cannot be excluded. Electronically Signed   By: [place]   On: 05/22/2020 11:10    DVT Prophylaxis -SCD/Eliquis  AM Labs Ordered, also please review Full Orders  Family Communication: Admission, patients condition and plan of care including tests being ordered have been discussed with the patient and daughter who indicate understanding and agree with the plan   Code Status - Full Code  Likely DC to  Home after improvement of resp sxs/hypoxia  Condition   -stable  Roxan Hockey M.D on 05/22/2020 at 7:14 PM Go to [email or web address] -  for contact info  [facility] - Office  [phone]

## 2020-05-22 NOTE — ED Provider Notes (Signed)
[facility] EMERGENCY DEPARTMENT Provider Note   CSN: [id number] Arrival date & time: 05/22/20  8315     History Chief Complaint  Patient presents with  . Fever    Max Cole is a 86 y.o. male.  Patient has had a fever and weakness for 10 days now.  Some shortness of breath  The history is provided by the patient. No language interpreter was used.  Fever Temp source:  Oral Severity:  Mild Onset quality:  Sudden Progression:  Worsening Chronicity:  New Relieved by:  Nothing Worsened by:  Nothing Ineffective treatments:  None tried Associated symptoms: no chest pain, no congestion, no cough, no diarrhea, no headaches and no rash        Past Medical History:  Diagnosis Date  . Atrial fibrillation ([place])   . Bradycardia   . Cardiomyopathy 2005   a. Possibly alcoholic; b. cath in 1761-60% ostial D1, PCW of 12, EF of 35-40%;  c. EF of 0.25 in 11/2003;  d. 40-45% in 05/2004;  e. 25% in 10/2008 by echo;  f. 04/2015 Echo: EF 40-45%, diff HK, sev inflat/inf HK, Gr 1 DD, mild AI, triv TR.  . Carotid artery occlusion   . Cerebrovascular disease    Carotid ultrasound in 12/2010-60-79% left internal carotid artery, less than 40% or right RICA, no change  . Chronic kidney disease    Creatinine of 1.6 in 2009  . Hyperlipidemia 10/15/2011   Lipid profile in 03/2009:231, 136, 56, 148  . Left bundle branch block   . Prostate carcinoma ([place])   . Syncope    [place] CRT-P 07/03/15 Dr. Lovena Le    Patient Active Problem List   Diagnosis Date Noted  . Atrial fibrillation with RVR ([place]) 02/06/2020  . Systolic and diastolic CHF, chronic ([place]) 02/06/2020  . Chronic systolic heart failure ([place]) 07/03/2015  . Pacemaker 07/03/2015  . Syncope 05/20/2015  . Nonischemic cardiomyopathy ([place]) 05/20/2015  . Bradycardia   . Carotid stenosis, bilateral 11/19/2013  . Laboratory test 10/15/2011  . Hyperlipidemia 10/15/2011  . Cerebrovascular disease   . Left bundle branch block   .  Cardiomyopathy, nonischemic ([place])   . CKD (chronic kidney disease) stage 3, GFR 30-59 ml/min ([facility])     Past Surgical History:  Procedure Laterality Date  . CATARACT EXTRACTION     Right  . COLONOSCOPY N/A 08/16/2012   Procedure: COLONOSCOPY;  Surgeon: Rogene Houston, MD;  Location: [facility];  Service: Endoscopy;  Laterality: N/A;  930  . COLONOSCOPY N/A 10/21/2015   Procedure: COLONOSCOPY;  Surgeon: Rogene Houston, MD;  Location: [facility];  Service: Endoscopy;  Laterality: N/A;  1200  . COLONOSCOPY W/ POLYPECTOMY  2010   Diverticulosis  . EP IMPLANTABLE DEVICE N/A 07/03/2015   Procedure: BiV Pacemaker Insertion CRT-P;  Surgeon: Evans Lance, MD;  Location: [place] CV LAB;  Service: Cardiovascular;  Laterality: N/A;  . EYE SURGERY Right    Cataract  . HERNIA REPAIR         Family History  Problem Relation Age of Onset  . Hypertension Mother     Social History   Tobacco Use  . Smoking status: Former Smoker    Packs/day: 1.00    Years: 30.00    Pack years: 30.00    Quit date: 09/28/1973    Years since quitting: 46.6  . Smokeless tobacco: Never Used  Vaping Use  . Vaping Use: Never used  Substance Use Topics  . Alcohol use:  Yes    Alcohol/week: 5.0 standard drinks    Types: 5 Glasses of wine per week    Comment: History of excessive alcohol use; Alternates between glass of wine vs. Liquor drink 5 days per week (04/2015)  . Drug use: No    Home Medications Prior to Admission medications   Medication Sig Start Date End Date Taking? Authorizing Provider  amiodarone (PACERONE) 200 MG tablet Take 1 tablet (200 mg total) by mouth daily. 02/29/20 04/27/20  Manuella Ghazi, Pratik D, DO  apixaban (ELIQUIS) 2.5 MG TABS tablet Take 1 tablet (2.5 mg total) by mouth 2 (two) times daily. 02/07/20   Manuella Ghazi, Pratik D, DO  carvedilol (COREG) 3.125 MG tablet TAKE (1) TABLET BY MOUTH TWICE DAILY WITH A MEAL. 04/16/20   Evans Lance, MD  ferrous sulfate 325 (65 FE) MG tablet Take 325 mg  by mouth daily with breakfast.    [provider]  methocarbamol (ROBAXIN) 500 MG tablet Take 1 tablet (500 mg total) by mouth 3 (three) times daily. 04/27/20   Carole Civil, MD  pravastatin (PRAVACHOL) 20 MG tablet Take 1 tablet (20 mg total) by mouth every evening. 04/17/20   Evans Lance, MD  predniSONE (STERAPRED UNI-PAK 48 TAB) 10 MG (48) TBPK tablet Take by mouth daily. 04/27/20   Carole Civil, MD  tamsulosin (FLOMAX) 0.4 MG CAPS capsule Take 0.4 mg by mouth daily. 04/17/20   [provider]    Allergies    Patient has no known allergies.  Review of Systems   Review of Systems  Constitutional: Positive for fever. Negative for appetite change and fatigue.  HENT: Negative for congestion, ear discharge and sinus pressure.   Eyes: Negative for discharge.  Respiratory: Negative for cough.   Cardiovascular: Negative for chest pain.  Gastrointestinal: Negative for abdominal pain and diarrhea.  Genitourinary: Negative for frequency and hematuria.  Musculoskeletal: Negative for back pain.  Skin: Negative for rash.  Neurological: Negative for seizures and headaches.  Psychiatric/Behavioral: Negative for hallucinations.    Physical Exam Updated Vital Signs BP (!) 149/72   Pulse 77   Temp 99.6 F (37.6 C) (Rectal)   Resp 19   Ht 6\' 1"  (1.854 m)   Wt 80.7 kg   SpO2 97%   BMI 23.48 kg/m   Physical Exam Vitals and nursing note reviewed.  Constitutional:      Appearance: He is well-developed.  HENT:     Head: Normocephalic.     Nose: Nose normal.  Eyes:     General: No scleral icterus.    Conjunctiva/sclera: Conjunctivae normal.  Neck:     Thyroid: No thyromegaly.  Cardiovascular:     Rate and Rhythm: Normal rate and regular rhythm.     Heart sounds: No murmur heard.  No friction rub. No gallop.   Pulmonary:     Breath sounds: No stridor. No wheezing or rales.  Chest:     Chest wall: No tenderness.  Abdominal:     General: There is no  distension.     Tenderness: There is no abdominal tenderness. There is no rebound.  Musculoskeletal:        General: Normal range of motion.     Cervical back: Neck supple.  Lymphadenopathy:     Cervical: No cervical adenopathy.  Skin:    Findings: No erythema or rash.  Neurological:     Mental Status: He is alert and oriented to person, place, and time.     Motor: No  abnormal muscle tone.     Coordination: Coordination normal.  Psychiatric:        Behavior: Behavior normal.     ED Results / Procedures / Treatments   Labs (all labs ordered are listed, but only abnormal results are displayed) Labs Reviewed  CBC WITH DIFFERENTIAL/PLATELET - Abnormal; Notable for the following components:      Result Value   RBC 3.49 (*)    Hemoglobin 11.3 (*)    HCT 34.4 (*)    Abs Immature Granulocytes 0.10 (*)    All other components within normal limits  COMPREHENSIVE METABOLIC PANEL - Abnormal; Notable for the following components:   Sodium 134 (*)    Glucose, Bld 101 (*)    Creatinine, Ser 1.72 (*)    Calcium 8.5 (*)    Albumin 2.7 (*)    GFR, Estimated 37 (*)    All other components within normal limits  D-DIMER, QUANTITATIVE (NOT AT [facility]) - Abnormal; Notable for the following components:   D-Dimer, Quant 1.58 (*)    All other components within normal limits  URINALYSIS, ROUTINE W REFLEX MICROSCOPIC - Abnormal; Notable for the following components:   Protein, ur 30 (*)    All other components within normal limits  RESP PANEL BY RT-PCR (FLU A&B, COVID) ARPGX2  CULTURE, BLOOD (ROUTINE X 2)  CULTURE, BLOOD (ROUTINE X 2)  LACTIC ACID, PLASMA  TROPONIN I (HIGH SENSITIVITY)  TROPONIN I (HIGH SENSITIVITY)    EKG None  Radiology CT Angio Chest PE W and/or Wo Contrast  Result Date: 05/22/2020 CLINICAL DATA:  86 year old male with chest pain. Weakness. Shortness of breath. Abnormal D-dimer. EXAM: CT ANGIOGRAPHY CHEST WITH CONTRAST TECHNIQUE: Multidetector CT imaging of the chest  was performed using the standard protocol during bolus administration of intravenous contrast. Multiplanar CT image reconstructions and MIPs were obtained to evaluate the vascular anatomy. CONTRAST:  54mL OMNIPAQUE IOHEXOL 350 MG/ML SOLN COMPARISON:  Portable chest earlier today. Chest radiographs 07/09/2015. Neck CT, CTA 07/03/2017 and earlier. FINDINGS: Cardiovascular: Good contrast bolus timing in the pulmonary arterial tree. No focal filling defect identified in the pulmonary arteries to suggest acute pulmonary embolism. Mild to moderate enlargement of central pulmonary arteries relative to the aorta (series 3, image 147). Calcified aortic atherosclerosis. Calcified coronary artery atherosclerosis. Cardiomegaly. Left chest cardiac pacemaker type device. No pericardial effusion. Mediastinum/Nodes: Negative.  No lymphadenopathy. Lungs/Pleura: Major airways are patent. Widespread although fairly symmetric peripheral, peribronchial, and also in the lung apices this is new or progressed since 2019. No consolidation. Trace layering pleural fluid greater on the left. And dependent pulmonary ground-glass opacity in both lungs Upper Abdomen: Negative for age visible upper abdominal viscera Musculoskeletal: Chronic T12 superior endplate compression suspected to be stable since 2017 radiographs. No acute osseous abnormality identified. Review of the MIP images confirms the above findings. IMPRESSION: 1. Negative for pulmonary embolus. 2. Widespread abnormal although nonspecific pulmonary ground-glass opacity with trace layering pleural fluid. Acute viral/atypical respiratory infection not excluded. Chronic interstitial lung disease is also a consideration. 3. Cardiomegaly with mild to moderate enlargement of the central pulmonary arteries raising the possibility of pulmonary artery hypertension. Aortic Atherosclerosis (ICD10-I70.0). Electronically Signed   By: Genevie Ann M.D.   On: 05/22/2020 14:38   DG Chest Port 1  View  Result Date: 05/22/2020 CLINICAL DATA:  Weakness and shortness of breath. EXAM: PORTABLE CHEST 1 VIEW COMPARISON:  Chest x-ray 07/09/2015. FINDINGS: AICD in stable position. Cardiomegaly with mild bilateral interstitial prominence suggesting interstitial edema. Pneumonitis cannot  be excluded. No pleural effusion or pneumothorax. IMPRESSION: AICD in stable position. Cardiomegaly with mild bilateral interstitial prominence suggesting interstitial edema. Pneumonitis cannot be excluded. Electronically Signed   By: Marcello Moores  Register   On: 05/22/2020 11:10    Procedures Procedures (including critical care time)  Medications Ordered in ED Medications  azithromycin (ZITHROMAX) 500 mg in sodium chloride 0.9 % 250 mL IVPB (has no administration in time range)  sodium chloride 0.9 % bolus 500 mL (0 mLs Intravenous Stopped 05/22/20 1357)  iohexol (OMNIPAQUE) 350 MG/ML injection 75 mL (75 mLs Intravenous Contrast Given 05/22/20 1425)  CRITICAL CARE Performed by: Milton Ferguson Total critical care time: 45 minutes Critical care time was exclusive of separately billable procedures and treating other patients. Critical care was necessary to treat or prevent imminent or life-threatening deterioration. Critical care was time spent personally by me on the following activities: development of treatment plan with patient and/or surrogate as well as nursing, discussions with consultants, evaluation of patient's response to treatment, examination of patient, obtaining history from patient or surrogate, ordering and performing treatments and interventions, ordering and review of laboratory studies, ordering and review of radiographic studies, pulse oximetry and re-evaluation of patient's condition.   ED Course  I have reviewed the triage vital signs and the nursing notes.  Pertinent labs & imaging results that were available during my care of the patient were reviewed by me and considered in my medical decision  making (see chart for details).    MDM Rules/Calculators/A&P                          CT scan shows possible atypical infection.  Patient is hypoxic in the upper 80s when he is laying down and it dropped down to low 80s when he walks..  Patient will be admitted for hypoxia and possible atypical lung infection Final Clinical Impression(s) / ED Diagnoses Final diagnoses:  None    Rx / DC Orders ED Discharge Orders    None       Milton Ferguson, MD 05/26/20 [phone]

## 2020-05-22 NOTE — ED Notes (Signed)
Took pt off of nasal cannula to see if pt could tolerate RA. Pt oxygen dropped to 80 %. Nasal cannula reapplied 2 lpm.

## 2020-05-22 NOTE — ED Triage Notes (Signed)
Pt reports being sick for at least 10 days. Reports increase weakness and SOB with exertion. Pt reports has had both covid shots and has taken 2 home test which were negative

## 2020-05-23 DIAGNOSIS — I5042 Chronic combined systolic (congestive) and diastolic (congestive) heart failure: Secondary | ICD-10-CM | POA: Diagnosis not present

## 2020-05-23 DIAGNOSIS — J9601 Acute respiratory failure with hypoxia: Secondary | ICD-10-CM | POA: Diagnosis not present

## 2020-05-23 DIAGNOSIS — N1832 Chronic kidney disease, stage 3b: Secondary | ICD-10-CM | POA: Diagnosis not present

## 2020-05-23 DIAGNOSIS — I4891 Unspecified atrial fibrillation: Secondary | ICD-10-CM | POA: Diagnosis not present

## 2020-05-23 DIAGNOSIS — J181 Lobar pneumonia, unspecified organism: Secondary | ICD-10-CM

## 2020-05-23 DIAGNOSIS — Z950 Presence of cardiac pacemaker: Secondary | ICD-10-CM

## 2020-05-23 LAB — BASIC METABOLIC PANEL
Anion gap: 8 (ref 5–15)
BUN: 22 mg/dL (ref 8–23)
CO2: 26 mmol/L (ref 22–32)
Calcium: 8.1 mg/dL — ABNORMAL LOW (ref 8.9–10.3)
Chloride: 101 mmol/L (ref 98–111)
Creatinine, Ser: 1.46 mg/dL — ABNORMAL HIGH (ref 0.61–1.24)
GFR, Estimated: 45 mL/min — ABNORMAL LOW (ref >60)
Glucose, Bld: 97 mg/dL (ref 70–99)
Potassium: 4 mmol/L (ref 3.5–5.1)
Sodium: 135 mmol/L (ref 135–145)

## 2020-05-23 LAB — HEPATIC FUNCTION PANEL
ALT: 17 U/L (ref 0–44)
AST: 20 U/L (ref 15–41)
Albumin: 2.1 g/dL — ABNORMAL LOW (ref 3.5–5.0)
Alkaline Phosphatase: 55 U/L (ref 38–126)
Bilirubin, Direct: 0.1 mg/dL (ref 0.0–0.2)
Indirect Bilirubin: 0.4 mg/dL (ref 0.3–0.9)
Total Bilirubin: 0.5 mg/dL (ref 0.3–1.2)
Total Protein: 5.4 g/dL — ABNORMAL LOW (ref 6.5–8.1)

## 2020-05-23 LAB — CBC
HCT: 29 % — ABNORMAL LOW (ref 39.0–52.0)
HCT: 30.7 % — ABNORMAL LOW (ref 39.0–52.0)
Hemoglobin: 9.6 g/dL — ABNORMAL LOW (ref 13.0–17.0)
Hemoglobin: 10.1 g/dL — ABNORMAL LOW (ref 13.0–17.0)
MCH: 32.8 pg (ref 26.0–34.0)
MCH: 32.8 pg (ref 26.0–34.0)
MCHC: 33.1 g/dL (ref 30.0–36.0)
MCHC: 32.9 g/dL (ref 30.0–36.0)
MCV: 99 fL (ref 80.0–100.0)
MCV: 99.7 fL (ref 80.0–100.0)
Platelets: 327 10*3/uL (ref 150–400)
Platelets: 328 10*3/uL (ref 150–400)
RBC: 2.93 MIL/uL — ABNORMAL LOW (ref 4.22–5.81)
RBC: 3.08 MIL/uL — ABNORMAL LOW (ref 4.22–5.81)
RDW: 12.6 % (ref 11.5–15.5)
RDW: 12.7 % (ref 11.5–15.5)
WBC: 9.3 10*3/uL (ref 4.0–10.5)
WBC: 9.3 10*3/uL (ref 4.0–10.5)
nRBC: 0 % (ref 0.0–0.2)
nRBC: 0 % (ref 0.0–0.2)

## 2020-05-23 LAB — BRAIN NATRIURETIC PEPTIDE: B Natriuretic Peptide: 303 pg/mL — ABNORMAL HIGH (ref 0.0–100.0)

## 2020-05-23 LAB — STREP PNEUMONIAE URINARY ANTIGEN: Strep Pneumo Urinary Antigen: NEGATIVE

## 2020-05-23 LAB — PROCALCITONIN: Procalcitonin: 0.13 ng/mL

## 2020-05-23 LAB — RESP PANEL BY RT-PCR (FLU A&B, COVID) ARPGX2
Influenza A by PCR: NEGATIVE
Influenza B by PCR: NEGATIVE
SARS Coronavirus 2 by RT PCR: NEGATIVE

## 2020-05-23 MED ORDER — IPRATROPIUM-ALBUTEROL 0.5-2.5 (3) MG/3ML IN SOLN
3.0000 mL | Freq: Four times a day (QID) | RESPIRATORY_TRACT | Status: DC
  Administered 2020-05-23 (×3): 3 mL via RESPIRATORY_TRACT
  Filled 2020-05-23 (×3): qty 3

## 2020-05-23 MED ORDER — SALINE SPRAY 0.65 % NA SOLN
1.0000 | NASAL | Status: DC | PRN
  Administered 2020-05-23 – 2020-05-31 (×2): 1 via NASAL
  Filled 2020-05-23 (×2): qty 44

## 2020-05-23 MED ORDER — SODIUM CHLORIDE 0.9 % IV SOLN
1.0000 g | INTRAVENOUS | Status: DC
  Administered 2020-05-23 – 2020-05-27 (×5): 1 g via INTRAVENOUS
  Filled 2020-05-23 (×7): qty 10

## 2020-05-23 MED ORDER — IPRATROPIUM-ALBUTEROL 0.5-2.5 (3) MG/3ML IN SOLN
3.0000 mL | Freq: Three times a day (TID) | RESPIRATORY_TRACT | Status: DC
  Administered 2020-05-24 – 2020-05-26 (×7): 3 mL via RESPIRATORY_TRACT
  Filled 2020-05-23 (×7): qty 3

## 2020-05-23 NOTE — Progress Notes (Signed)
PROGRESS NOTE  Max Cole ZOX:[id number] DOB: 02/09/1928 DOA: 05/22/2020 PCP: Asencion Noble, MD  Brief History:  86 y.o. male with medical history of systolic CHF status post BiV PPM, CKD stage III, carotid stenosis, hyperlipidemia, PAF on apixaban and hypertension presenting approximately 10-day history of generalized weakness, dyspnea on exertion, fevers and chills, and shortness of breath.  Apparently, the patient stated that he saw his primary care provider approximately 2 to 3 weeks prior to this admission.  He was placed on a prednisone taper.  There is no improvement of his symptoms.  He took some over-the-counter NyQuil with no improvement.  He called his PCP again, the patient was placed on empiric dose of Tamiflu.  He continued to have malaise and shortness of breath.  As result, he presented for further evaluation.  He denied any nausea, vomiting, diarrhea, chest pain, hemoptysis, abdominal pain, dysuria, hematuria, hematochezia, melena.  The patient has not started any other new medications except for Robaxin and prednisone.  He denies any worsening lower extremity edema, orthopnea, chest pain, headache, sore throat. In the emergency department, the patient was febrile up to 100.11F.  He was hemodynamically stable with oxygen saturation down to 80% on room air.  He was placed on 4 L nasal cannula with saturation 93-94%.  CTA chest was negative for PE but showed bilateral widespread groundglass opacities there was cardiomegaly with enlarged central pulmonary arteries.  There is a chronic T12 endplate fracture which was stable compared to 2017.  Assessment/Plan: Acute respiratory failure with hypoxia -Secondary to atypical pneumonia -Currently stable on 4 L nasal cannula -CTA chest as discussed above -Personally reviewed chest x-ray--bilateral patchy infiltrates, recurrent left -COVID-19 RT-PCR was negative -Influenza PCR was negative  Atypical pneumonia/lobar  pneumonia -Urine Legionella antigen -Urine Streptococcus pneumonia antigen -Viral respiratory panel -Continue azithromycin and ceftriaxone -Lactic acid 1.3  Paroxysmal atrial fibrillation -CHADSVASc = 5 -continue apixaban -currently in sinus -continue amiodarone -continue coreg  CKD stage IIIb -Baseline creatinine 1.6-1.7 -A.m. BMP  Chronic systolic and diastolic CHF -Clinically euvolemic -05/21/2015 echo EF 20 to 25%, grade 1 DD -02/07/2020 echo EF 50-55%, no WMA, grade 1 DD, mild AR -Clinically compensated presently -Daily weights  Left carotid stenosis -Continue apixaban  Hyperlipidemia -Continue statin  BPH -Continue tamsulosin        Status is: Inpatient  Remains inpatient appropriate because:IV treatments appropriate due to intensity of illness or inability to take PO   Dispo: The patient is from: Home              Anticipated d/c is to: Home              Anticipated d/c date is: 2 days              Patient currently is not medically stable to d/c.        Family Communication:   Daughter updated at bedside 11/27  Consultants:  none  Code Status:  FULL  DVT Prophylaxis: apixaban   Procedures: As Listed in Progress Note Above  Antibiotics: Ceftriaxone 11/27>> azithro 11/26>>     Subjective: Patient feels he is breathing a little better.  Denies f/c, cp, n/v/d, abd pain  Objective: Vitals:   05/23/20 0301 05/23/20 0540 05/23/20 0735 05/23/20 0755  BP:  132/70  (!) 141/57  Pulse:  90  82  Resp:  18    Temp:  98 F (36.7 C)  98.4 F (36.9 C)  TempSrc:    Oral  SpO2:  (!) 82% 90% 94%  Weight: 80.8 kg     Height:        Intake/Output Summary (Last 24 hours) at 05/23/2020 1027 Last data filed at 05/22/2020 1657 Gross per 24 hour  Intake 3398.08 ml  Output --  Net 3398.08 ml   Weight change:  Exam:   General:  Pt is alert, follows commands appropriately, not in acute distress  HEENT: No icterus, No thrush, No  neck mass, [place]/AT  Cardiovascular: RRR, S1/S2, no rubs, no gallops  Respiratory: bilateral rales. No wheeze  Abdomen: Soft/+BS, non tender, non distended, no guarding  Extremities: No edema, No lymphangitis, No petechiae, No rashes, no synovitis   Data Reviewed: I have personally reviewed following labs and imaging studies Basic Metabolic Panel: Recent Labs  Lab 05/22/20 1052 05/23/20 0729  NA 134* 135  K 3.9 4.0  CL 98 101  CO2 27 26  GLUCOSE 101* 97  BUN 21 22  CREATININE 1.72* 1.46*  CALCIUM 8.5* 8.1*   Liver Function Tests: Recent Labs  Lab 05/22/20 1052 05/23/20 0729  AST 22 20  ALT 18 17  ALKPHOS 70 55  BILITOT 0.5 0.5  PROT 6.8 5.4*  ALBUMIN 2.7* 2.1*   No results for input(s): LIPASE, AMYLASE in the last 168 hours. No results for input(s): AMMONIA in the last 168 hours. Coagulation Profile: No results for input(s): INR, PROTIME in the last 168 hours. CBC: Recent Labs  Lab 05/22/20 1052 05/23/20 0729  WBC 9.9 9.3  NEUTROABS 7.7  --   HGB 11.3* 9.6*  HCT 34.4* 29.0*  MCV 98.6 99.0  PLT 343 327   Cardiac Enzymes: No results for input(s): CKTOTAL, CKMB, CKMBINDEX, TROPONINI in the last 168 hours. BNP: Invalid input(s): POCBNP CBG: No results for input(s): GLUCAP in the last 168 hours. HbA1C: No results for input(s): HGBA1C in the last 72 hours. Urine analysis:    Component Value Date/Time   COLORURINE YELLOW 05/22/2020 [place] 05/22/2020 1041   LABSPEC 1.015 05/22/2020 1041   PHURINE 6.0 05/22/2020 1041   GLUCOSEU NEGATIVE 05/22/2020 1041   GLUCOSEU neg 02/26/2009 0000   HGBUR NEGATIVE 05/22/2020 [place] 05/22/2020 [place] 05/22/2020 1041   PROTEINUR 30 (A) 05/22/2020 1041   NITRITE NEGATIVE 05/22/2020 1041   LEUKOCYTESUR NEGATIVE 05/22/2020 1041   Sepsis Labs: @LABRCNTIP (procalcitonin:4,lacticidven:4) ) Recent Results (from the past 240 hour(s))  Resp Panel by RT-PCR (Flu  A&B, Covid) Nasopharyngeal Swab     Status: None   Collection Time: 05/22/20  9:57 AM   Specimen: Nasopharyngeal Swab; Nasopharyngeal(NP) swabs in vial transport medium  Result Value Ref Range Status   SARS Coronavirus 2 by RT PCR NEGATIVE NEGATIVE Final    Comment: (NOTE) SARS-CoV-2 target nucleic acids are NOT DETECTED.  The SARS-CoV-2 RNA is generally detectable in upper respiratory specimens during the acute phase of infection. The lowest concentration of SARS-CoV-2 viral copies this assay can detect is 138 copies/mL. A negative result does not preclude SARS-Cov-2 infection and should not be used as the sole basis for treatment or other patient management decisions. A negative result may occur with  improper specimen collection/handling, submission of specimen other than nasopharyngeal swab, presence of viral mutation(s) within the areas targeted by this assay, and inadequate number of viral copies(<138 copies/mL). A negative result must be combined with clinical observations, patient history, and epidemiological information. The expected result is Negative.  Fact  Sheet for Patients:  EntrepreneurPulse.com.au  Fact Sheet for Healthcare Providers:  IncredibleEmployment.be  This test is no t yet approved or cleared by the [place] FDA and  has been authorized for detection and/or diagnosis of SARS-CoV-2 by FDA under an Emergency Use Authorization (EUA). This EUA will remain  in effect (meaning this test can be used) for the duration of the COVID-19 declaration under Section 564(b)(1) of the Act, 21 U.S.C.section 360bbb-3(b)(1), unless the authorization is terminated  or revoked sooner.       Influenza A by PCR NEGATIVE NEGATIVE Final   Influenza B by PCR NEGATIVE NEGATIVE Final    Comment: (NOTE) The Xpert Xpress SARS-CoV-2/FLU/RSV plus assay is intended as an aid in the diagnosis of influenza from Nasopharyngeal swab specimens  and should not be used as a sole basis for treatment. Nasal washings and aspirates are unacceptable for Xpert Xpress SARS-CoV-2/FLU/RSV testing.  Fact Sheet for Patients: EntrepreneurPulse.com.au  Fact Sheet for Healthcare Providers: IncredibleEmployment.be  This test is not yet approved or cleared by the [place] FDA and has been authorized for detection and/or diagnosis of SARS-CoV-2 by FDA under an Emergency Use Authorization (EUA). This EUA will remain in effect (meaning this test can be used) for the duration of the COVID-19 declaration under Section 564(b)(1) of the Act, 21 U.S.C. section 360bbb-3(b)(1), unless the authorization is terminated or revoked.  Performed at [facility], [address], [place], [place] [address]   Blood culture (routine x 2)     Status: None (Preliminary result)   Collection Time: 05/22/20 10:45 AM   Specimen: BLOOD LEFT HAND  Result Value Ref Range Status   Specimen Description   Final    BLOOD LEFT HAND BOTTLES DRAWN AEROBIC AND ANAEROBIC   Special Requests   Final    Blood Culture results may not be optimal due to an inadequate volume of blood received in culture bottles   Culture   Final    NO GROWTH < 24 HOURS Performed at [facility], [address] [place], [place] [address]    Report Status PENDING  Incomplete  Blood culture (routine x 2)     Status: None (Preliminary result)   Collection Time: 05/22/20 10:52 AM   Specimen: Right Antecubital; Blood  Result Value Ref Range Status   Specimen Description   Final    RIGHT ANTECUBITAL BOTTLES DRAWN AEROBIC AND ANAEROBIC   Special Requests Blood Culture adequate volume  Final   Culture   Final    NO GROWTH < 24 HOURS Performed at [facility], [address], [place], [place] [address]    Report Status PENDING  Incomplete     Scheduled Meds:  amiodarone  200 mg Oral Daily   apixaban  2.5 mg Oral BID   carvedilol  3.125 mg Oral BID WC    ferrous sulfate  325 mg Oral Q breakfast   guaiFENesin  600 mg Oral BID   ipratropium-albuterol  3 mL Nebulization Q6H WA   methocarbamol  500 mg Oral TID   pravastatin  20 mg Oral QPM   sodium chloride flush  3 mL Intravenous Q12H   sodium chloride flush  3 mL Intravenous Q12H   tamsulosin  0.4 mg Oral Daily   Continuous Infusions:  sodium chloride     azithromycin     cefTRIAXone (ROCEPHIN)  IV 1 g (05/23/20 1012)    Procedures/Studies: CT Angio Chest PE W and/or Wo Contrast  Result Date: 05/22/2020 CLINICAL DATA:  86 year old  male with chest pain. Weakness. Shortness of breath. Abnormal D-dimer. EXAM: CT ANGIOGRAPHY CHEST WITH CONTRAST TECHNIQUE: Multidetector CT imaging of the chest was performed using the standard protocol during bolus administration of intravenous contrast. Multiplanar CT image reconstructions and MIPs were obtained to evaluate the vascular anatomy. CONTRAST:  70mL OMNIPAQUE IOHEXOL 350 MG/ML SOLN COMPARISON:  Portable chest earlier today. Chest radiographs 07/09/2015. Neck CT, CTA 07/03/2017 and earlier. FINDINGS: Cardiovascular: Good contrast bolus timing in the pulmonary arterial tree. No focal filling defect identified in the pulmonary arteries to suggest acute pulmonary embolism. Mild to moderate enlargement of central pulmonary arteries relative to the aorta (series 3, image 147). Calcified aortic atherosclerosis. Calcified coronary artery atherosclerosis. Cardiomegaly. Left chest cardiac pacemaker type device. No pericardial effusion. Mediastinum/Nodes: Negative.  No lymphadenopathy. Lungs/Pleura: Major airways are patent. Widespread although fairly symmetric peripheral, peribronchial, and also in the lung apices this is new or progressed since 2019. No consolidation. Trace layering pleural fluid greater on the left. And dependent pulmonary ground-glass opacity in both lungs Upper Abdomen: Negative for age visible upper abdominal viscera Musculoskeletal:  Chronic T12 superior endplate compression suspected to be stable since 2017 radiographs. No acute osseous abnormality identified. Review of the MIP images confirms the above findings. IMPRESSION: 1. Negative for pulmonary embolus. 2. Widespread abnormal although nonspecific pulmonary ground-glass opacity with trace layering pleural fluid. Acute viral/atypical respiratory infection not excluded. Chronic interstitial lung disease is also a consideration. 3. Cardiomegaly with mild to moderate enlargement of the central pulmonary arteries raising the possibility of pulmonary artery hypertension. Aortic Atherosclerosis (ICD10-I70.0). Electronically Signed   By: Genevie Ann M.D.   On: 05/22/2020 14:38   DG Chest Port 1 View  Result Date: 05/22/2020 CLINICAL DATA:  Weakness and shortness of breath. EXAM: PORTABLE CHEST 1 VIEW COMPARISON:  Chest x-ray 07/09/2015. FINDINGS: AICD in stable position. Cardiomegaly with mild bilateral interstitial prominence suggesting interstitial edema. Pneumonitis cannot be excluded. No pleural effusion or pneumothorax. IMPRESSION: AICD in stable position. Cardiomegaly with mild bilateral interstitial prominence suggesting interstitial edema. Pneumonitis cannot be excluded. Electronically Signed   By: Marcello Moores  Register   On: 05/22/2020 11:10   CUP PACEART REMOTE DEVICE CHECK  Result Date: 05/06/2020 Scheduled remote reviewed. Normal device function.  1 SVT logged 7 minutes 12 seconds w/ rates 160's bpm 1 ATR - same event as above 10 PMT successfully terminated by PMT algorithm Next remote 91 days.   Orson Eva, DO  [facility]  If 7PM-7AM, please contact night-coverage [email or web address] Password [email or web address] 05/23/2020, 10:27 AM   LOS: 1 day

## 2020-05-23 NOTE — Plan of Care (Signed)

## 2020-05-24 ENCOUNTER — Inpatient Hospital Stay (HOSPITAL_COMMUNITY): Payer: Medicare Other

## 2020-05-24 DIAGNOSIS — I5022 Chronic systolic (congestive) heart failure: Secondary | ICD-10-CM | POA: Diagnosis not present

## 2020-05-24 DIAGNOSIS — J181 Lobar pneumonia, unspecified organism: Secondary | ICD-10-CM | POA: Diagnosis not present

## 2020-05-24 DIAGNOSIS — I482 Chronic atrial fibrillation, unspecified: Secondary | ICD-10-CM | POA: Diagnosis not present

## 2020-05-24 DIAGNOSIS — N1832 Chronic kidney disease, stage 3b: Secondary | ICD-10-CM | POA: Diagnosis not present

## 2020-05-24 LAB — BASIC METABOLIC PANEL
Anion gap: 8 (ref 5–15)
BUN: 25 mg/dL — ABNORMAL HIGH (ref 8–23)
CO2: 26 mmol/L (ref 22–32)
Calcium: 8.1 mg/dL — ABNORMAL LOW (ref 8.9–10.3)
Chloride: 97 mmol/L — ABNORMAL LOW (ref 98–111)
Creatinine, Ser: 1.47 mg/dL — ABNORMAL HIGH (ref 0.61–1.24)
GFR, Estimated: 45 mL/min — ABNORMAL LOW (ref >60)
Glucose, Bld: 95 mg/dL (ref 70–99)
Potassium: 3.9 mmol/L (ref 3.5–5.1)
Sodium: 131 mmol/L — ABNORMAL LOW (ref 135–145)

## 2020-05-24 LAB — CBC WITH DIFFERENTIAL/PLATELET
Abs Immature Granulocytes: 0.08 10*3/uL — ABNORMAL HIGH (ref 0.00–0.07)
Basophils Absolute: 0.1 10*3/uL (ref 0.0–0.1)
Basophils Relative: 0 %
Eosinophils Absolute: 0.1 10*3/uL (ref 0.0–0.5)
Eosinophils Relative: 1 %
HCT: 31.6 % — ABNORMAL LOW (ref 39.0–52.0)
Hemoglobin: 10.2 g/dL — ABNORMAL LOW (ref 13.0–17.0)
Immature Granulocytes: 1 %
Lymphocytes Relative: 6 %
Lymphs Abs: 0.8 10*3/uL (ref 0.7–4.0)
MCH: 32.3 pg (ref 26.0–34.0)
MCHC: 32.3 g/dL (ref 30.0–36.0)
MCV: 100 fL (ref 80.0–100.0)
Monocytes Absolute: 1.4 10*3/uL — ABNORMAL HIGH (ref 0.1–1.0)
Monocytes Relative: 11 %
Neutro Abs: 9.7 10*3/uL — ABNORMAL HIGH (ref 1.7–7.7)
Neutrophils Relative %: 81 %
Platelets: 402 10*3/uL — ABNORMAL HIGH (ref 150–400)
RBC: 3.16 MIL/uL — ABNORMAL LOW (ref 4.22–5.81)
RDW: 12.6 % (ref 11.5–15.5)
WBC: 12 10*3/uL — ABNORMAL HIGH (ref 4.0–10.5)
nRBC: 0 % (ref 0.0–0.2)

## 2020-05-24 LAB — HEPATIC FUNCTION PANEL
ALT: 19 U/L (ref 0–44)
AST: 25 U/L (ref 15–41)
Albumin: 2.3 g/dL — ABNORMAL LOW (ref 3.5–5.0)
Alkaline Phosphatase: 58 U/L (ref 38–126)
Bilirubin, Direct: 0.2 mg/dL (ref 0.0–0.2)
Indirect Bilirubin: 0.3 mg/dL (ref 0.3–0.9)
Total Bilirubin: 0.5 mg/dL (ref 0.3–1.2)
Total Protein: 5.9 g/dL — ABNORMAL LOW (ref 6.5–8.1)

## 2020-05-24 LAB — BRAIN NATRIURETIC PEPTIDE: B Natriuretic Peptide: 292 pg/mL — ABNORMAL HIGH (ref 0.0–100.0)

## 2020-05-24 LAB — PROCALCITONIN: Procalcitonin: 0.23 ng/mL

## 2020-05-24 LAB — MAGNESIUM: Magnesium: 1.8 mg/dL (ref 1.7–2.4)

## 2020-05-24 MED ORDER — IOHEXOL 9 MG/ML PO SOLN
ORAL | Status: AC
  Administered 2020-05-24: 1 mL
  Filled 2020-05-24: qty 1000

## 2020-05-24 NOTE — Plan of Care (Signed)

## 2020-05-24 NOTE — Progress Notes (Signed)
   05/24/20 0815  What Happened  Was fall witnessed? No  Was patient injured? Yes  Patient found on floor  Found by Staff-comment Saul Fordyce RN, L Bullins RN, Pollie Friar RN, Virgel Paling RN )  Stated prior activity bathroom-unassisted  Follow Up  MD notified Dr. Carles Collet  Time MD notified [phone]  Family notified Yes - comment  Time family notified [phone]  Additional tests No  Simple treatment Dressing  Progress note created (see row info) Yes  Adult Fall Risk Assessment  Risk Factor Category (scoring not indicated) High fall risk per protocol (document High fall risk)  Age 86  Fall History: Fall within 6 months prior to admission 0  Elimination; Bowel and/or Urine Incontinence 0  Elimination; Bowel and/or Urine Urgency/Frequency 0  Medications: includes PCA/Opiates, Anti-convulsants, Anti-hypertensives, Diuretics, Hypnotics, Laxatives, Sedatives, and Psychotropics 0  Patient Care Equipment 1  Mobility-Assistance 2  Mobility-Gait 2  Mobility-Sensory Deficit 0  Altered awareness of immediate physical environment 0  Impulsiveness 2  Lack of understanding of one's physical/cognitive limitations 4  Total Score 14  Patient Fall Risk Level High fall risk  Adult Fall Risk Interventions  Required Bundle Interventions *See Row Information* High fall risk - low, moderate, and high requirements implemented  Additional Interventions Pharmacy review of medications;PT/OT need assessed if change in mobility from baseline;Room near nurses station;Use of appropriate toileting equipment (bedpan, BSC, etc.)  Screening for Fall Injury Risk (To be completed on HIGH fall risk patients) - Assessing Need for Low Bed  Risk For Fall Injury- Low Bed Criteria 85 years or older  Will Implement Low Bed and Floor Mats Yes  Vitals  Temp 98.7 F (37.1 C)  Temp Source Oral  BP 138/66  BP Location Left Arm  BP Method Automatic  Patient Position (if appropriate) Sitting  Pulse Rate 92  Pulse Rate Source Dinamap  Resp 18   Oxygen Therapy  SpO2 94 %  O2 Device Nasal Cannula  O2 Flow Rate (L/min) 3 L/min  Pain Assessment  Pain Scale 0-10  Pain Score 0  Neurological  Neuro (WDL) WDL  Level of Consciousness Alert  Musculoskeletal  Musculoskeletal (WDL) X  Assistive Device BSC  Generalized Weakness Yes  Weight Bearing Restrictions No  Integumentary  Integumentary (WDL) X  RN Assisting with Skin Assessment on Admission S, Brown RN,  S Norene Oliveri RN  Skin Color Appropriate for ethnicity  Skin Condition Dry  Skin Integrity Other (Comment) (skin tear right hand, left lower leg)  Skin Turgor Non-tenting  Pain Assessment  Result of Injury Yes  Pain Assessment  Work-Related Injury No

## 2020-05-24 NOTE — Progress Notes (Signed)
PROGRESS NOTE  Max Cole FYB:[id number] DOB: May 10, 1928 DOA: 05/22/2020 PCP: Asencion Noble, MD  Brief History:  86 y.o.malewith medical history ofsystolic CHF status postBiV PPM, CKD stage III, carotid stenosis, hyperlipidemia, PAF on apixaban and hypertension presenting approximately 10-day history of generalized weakness, dyspnea on exertion, fevers and chills, and shortness of breath.  Apparently, the patient stated that he saw his primary care provider approximately 2 to 3 weeks prior to this admission.  He was placed on a prednisone taper.  There is no improvement of his symptoms.  He took some over-the-counter NyQuil with no improvement.  He called his PCP again, the patient was placed on empiric dose of Tamiflu.  He continued to have malaise and shortness of breath.  As result, he presented for further evaluation.  He denied any nausea, vomiting, diarrhea, chest pain, hemoptysis, abdominal pain, dysuria, hematuria, hematochezia, melena.  The patient has not started any other new medications except for Robaxin and prednisone.  He denies any worsening lower extremity edema, orthopnea, chest pain, headache, sore throat.  He has one cat, no exotic pets.  No travels. In the emergency department, the patient was febrile up to 100.64F.  He was hemodynamically stable with oxygen saturation down to 80% on room air.  He was placed on 4 L nasal cannula with saturation 93-94%.  CTA chest was negative for PE but showed bilateral widespread groundglass opacities there was cardiomegaly with enlarged central pulmonary arteries.  There is a chronic T12 endplate fracture which was stable compared to 2017.  Assessment/Plan: Acute respiratory failure with hypoxia -Secondary to atypical pneumonia -Currently stable on 4 L nasal cannula -CTA chest as discussed above -Personally reviewed chest x-ray--bilateral patchy infiltrates, recurrent left -COVID-19 RT-PCR was negative x2 -Influenza PCR was  negative  Atypical pneumonia/lobar pneumonia -Urine Legionella antigen -Urine Streptococcus pneumonia antigen--neg -Viral respiratory panel -Continue azithromycin and ceftriaxone -Lactic acid 1.3  FUO -UA neg for pyuria -CT chest as discussed above -blood cultures remain neg -CT abd/pelvis  Paroxysmal atrial fibrillation -CHADSVASc = 5 -continue apixaban -currently in sinus -continue amiodarone -continue coreg  CKD stage IIIb -Baseline creatinine 1.6-1.7 -A.m. BMP  Chronic systolic and diastolic CHF -Clinically euvolemic -05/21/2015 echo EF 20 to 25%, grade 1 DD -02/07/2020 echo EF 50-55%, no WMA, grade 1 DD, mild AR -Clinically compensated presently -Daily weights  Left carotid stenosis -Continue apixaban  Hyperlipidemia -Continue statin  BPH -Continue tamsulosin        Status is: Inpatient  Remains inpatient appropriate because:IV treatments appropriate due to intensity of illness or inability to take PO   Dispo: The patient is from: Home  Anticipated d/c is to: Home  Anticipated d/c date is: 2 days  Patient currently is not medically stable to d/c.        Family Communication:   Daughter updated at bedside 11/28  Consultants:  none  Code Status:  FULL  DVT Prophylaxis: apixaban   Procedures: As Listed in Progress Note Above  Antibiotics: Ceftriaxone 11/27>> azithro 11/26>>    Total time spent 35 minutes.  Greater than 50% spent face to face counseling and coordinating care.     Subjective: Patient had mechanical fall this am while going to [place]--got tangled with his cords.  Denies headache, n/v/d, cp, hemoptysis.  Has dry cough..  No dysuria, hematuria, abd pain.  Objective: Vitals:   05/24/20 0150 05/24/20 0405 05/24/20 0815 05/24/20 0832  BP:  (!) 150/61 138/66  Pulse:  79 92   Resp:  18 18   Temp:  98.6 F (37 C) 98.7 F (37.1 C)   TempSrc:   Oral    SpO2:  90% 94% 91%  Weight: 80.9 kg     Height:        Intake/Output Summary (Last 24 hours) at 05/24/2020 0944 Last data filed at 05/23/2020 1815 Gross per 24 hour  Intake 930 ml  Output 1 ml  Net 929 ml   Weight change: 0.16 kg Exam:   General:  Pt is alert, follows commands appropriately, not in acute distress  HEENT: No icterus, No thrush, No neck mass, /AT  Cardiovascular: RRR, S1/S2, no rubs, no gallops  Respiratory: bilateral rales. No wheeze  Abdomen: Soft/+BS, non tender, non distended, no guarding  Extremities: No edema, No lymphangitis, No petechiae, No rashes, no synovitis: +skin tear on dorsum of right wrist area with dried blood   Data Reviewed: I have personally reviewed following labs and imaging studies Basic Metabolic Panel: Recent Labs  Lab 05/22/20 1052 05/23/20 0729 05/24/20 0653  NA 134* 135 131*  K 3.9 4.0 3.9  CL 98 101 97*  CO2 27 26 26   GLUCOSE 101* 97 95  BUN 21 22 25*  CREATININE 1.72* 1.46* 1.47*  CALCIUM 8.5* 8.1* 8.1*  MG  --   --  1.8   Liver Function Tests: Recent Labs  Lab 05/22/20 1052 05/23/20 0729  AST 22 20  ALT 18 17  ALKPHOS 70 55  BILITOT 0.5 0.5  PROT 6.8 5.4*  ALBUMIN 2.7* 2.1*   No results for input(s): LIPASE, AMYLASE in the last 168 hours. No results for input(s): AMMONIA in the last 168 hours. Coagulation Profile: No results for input(s): INR, PROTIME in the last 168 hours. CBC: Recent Labs  Lab 05/22/20 1052 05/23/20 0729 05/23/20 1137  WBC 9.9 9.3 9.3  NEUTROABS 7.7  --   --   HGB 11.3* 9.6* 10.1*  HCT 34.4* 29.0* 30.7*  MCV 98.6 99.0 99.7  PLT 343 327 328   Cardiac Enzymes: No results for input(s): CKTOTAL, CKMB, CKMBINDEX, TROPONINI in the last 168 hours. BNP: Invalid input(s): POCBNP CBG: No results for input(s): GLUCAP in the last 168 hours. HbA1C: No results for input(s): HGBA1C in the last 72 hours. Urine analysis:    Component Value Date/Time   COLORURINE YELLOW  05/22/2020 [place] 05/22/2020 1041   LABSPEC 1.015 05/22/2020 1041   PHURINE 6.0 05/22/2020 1041   GLUCOSEU NEGATIVE 05/22/2020 1041   GLUCOSEU neg 02/26/2009 0000   HGBUR NEGATIVE 05/22/2020 [place] 05/22/2020 [place] 05/22/2020 1041   PROTEINUR 30 (A) 05/22/2020 1041   NITRITE NEGATIVE 05/22/2020 1041   LEUKOCYTESUR NEGATIVE 05/22/2020 1041   Sepsis Labs: @LABRCNTIP (procalcitonin:4,lacticidven:4) ) Recent Results (from the past 240 hour(s))  Resp Panel by RT-PCR (Flu A&B, Covid) Nasopharyngeal Swab     Status: None   Collection Time: 05/22/20  9:57 AM   Specimen: Nasopharyngeal Swab; Nasopharyngeal(NP) swabs in vial transport medium  Result Value Ref Range Status   SARS Coronavirus 2 by RT PCR NEGATIVE NEGATIVE Final    Comment: (NOTE) SARS-CoV-2 target nucleic acids are NOT DETECTED.  The SARS-CoV-2 RNA is generally detectable in upper respiratory specimens during the acute phase of infection. The lowest concentration of SARS-CoV-2 viral copies this assay can detect is 138 copies/mL. A negative result does not preclude SARS-Cov-2 infection and should not be used as the  sole basis for treatment or other patient management decisions. A negative result may occur with  improper specimen collection/handling, submission of specimen other than nasopharyngeal swab, presence of viral mutation(s) within the areas targeted by this assay, and inadequate number of viral copies(<138 copies/mL). A negative result must be combined with clinical observations, patient history, and epidemiological information. The expected result is Negative.  Fact Sheet for Patients:  EntrepreneurPulse.com.au  Fact Sheet for Healthcare Providers:  IncredibleEmployment.be  This test is no t yet approved or cleared by the [place] FDA and  has been authorized for detection and/or diagnosis of SARS-CoV-2 by FDA  under an Emergency Use Authorization (EUA). This EUA will remain  in effect (meaning this test can be used) for the duration of the COVID-19 declaration under Section 564(b)(1) of the Act, 21 U.S.C.section 360bbb-3(b)(1), unless the authorization is terminated  or revoked sooner.       Influenza A by PCR NEGATIVE NEGATIVE Final   Influenza B by PCR NEGATIVE NEGATIVE Final    Comment: (NOTE) The Xpert Xpress SARS-CoV-2/FLU/RSV plus assay is intended as an aid in the diagnosis of influenza from Nasopharyngeal swab specimens and should not be used as a sole basis for treatment. Nasal washings and aspirates are unacceptable for Xpert Xpress SARS-CoV-2/FLU/RSV testing.  Fact Sheet for Patients: EntrepreneurPulse.com.au  Fact Sheet for Healthcare Providers: IncredibleEmployment.be  This test is not yet approved or cleared by the [place] FDA and has been authorized for detection and/or diagnosis of SARS-CoV-2 by FDA under an Emergency Use Authorization (EUA). This EUA will remain in effect (meaning this test can be used) for the duration of the COVID-19 declaration under Section 564(b)(1) of the Act, 21 U.S.C. section 360bbb-3(b)(1), unless the authorization is terminated or revoked.  Performed at [facility], [address] [place], [place] [address]   Blood culture (routine x 2)     Status: None (Preliminary result)   Collection Time: 05/22/20 10:45 AM   Specimen: BLOOD LEFT HAND  Result Value Ref Range Status   Specimen Description   Final    BLOOD LEFT HAND BOTTLES DRAWN AEROBIC AND ANAEROBIC   Special Requests   Final    Blood Culture results may not be optimal due to an inadequate volume of blood received in culture bottles   Culture   Final    NO GROWTH 2 DAYS Performed at [facility], [address], [place], [place] [address]    Report Status PENDING  Incomplete  Blood culture (routine x 2)     Status: None (Preliminary result)    Collection Time: 05/22/20 10:52 AM   Specimen: Right Antecubital; Blood  Result Value Ref Range Status   Specimen Description   Final    RIGHT ANTECUBITAL BOTTLES DRAWN AEROBIC AND ANAEROBIC   Special Requests Blood Culture adequate volume  Final   Culture   Final    NO GROWTH 2 DAYS Performed at [facility], [address], [place], [place] [address]    Report Status PENDING  Incomplete  Resp Panel by RT-PCR (Flu A&B, Covid)     Status: None   Collection Time: 05/23/20  6:40 PM  Result Value Ref Range Status   SARS Coronavirus 2 by RT PCR NEGATIVE NEGATIVE Final    Comment: (NOTE) SARS-CoV-2 target nucleic acids are NOT DETECTED.  The SARS-CoV-2 RNA is generally detectable in upper respiratory specimens during the acute phase of infection. The lowest concentration of SARS-CoV-2 viral copies this assay can detect is 138 copies/mL.  A negative result does not preclude SARS-Cov-2 infection and should not be used as the sole basis for treatment or other patient management decisions. A negative result may occur with  improper specimen collection/handling, submission of specimen other than nasopharyngeal swab, presence of viral mutation(s) within the areas targeted by this assay, and inadequate number of viral copies(<138 copies/mL). A negative result must be combined with clinical observations, patient history, and epidemiological information. The expected result is Negative.  Fact Sheet for Patients:  EntrepreneurPulse.com.au  Fact Sheet for Healthcare Providers:  IncredibleEmployment.be  This test is no t yet approved or cleared by the [place] FDA and  has been authorized for detection and/or diagnosis of SARS-CoV-2 by FDA under an Emergency Use Authorization (EUA). This EUA will remain  in effect (meaning this test can be used) for the duration of the COVID-19 declaration under Section 564(b)(1) of the Act, 21 U.S.C.section  360bbb-3(b)(1), unless the authorization is terminated  or revoked sooner.       Influenza A by PCR NEGATIVE NEGATIVE Final   Influenza B by PCR NEGATIVE NEGATIVE Final    Comment: (NOTE) The Xpert Xpress SARS-CoV-2/FLU/RSV plus assay is intended as an aid in the diagnosis of influenza from Nasopharyngeal swab specimens and should not be used as a sole basis for treatment. Nasal washings and aspirates are unacceptable for Xpert Xpress SARS-CoV-2/FLU/RSV testing.  Fact Sheet for Patients: EntrepreneurPulse.com.au  Fact Sheet for Healthcare Providers: IncredibleEmployment.be  This test is not yet approved or cleared by the [place] FDA and has been authorized for detection and/or diagnosis of SARS-CoV-2 by FDA under an Emergency Use Authorization (EUA). This EUA will remain in effect (meaning this test can be used) for the duration of the COVID-19 declaration under Section 564(b)(1) of the Act, 21 U.S.C. section 360bbb-3(b)(1), unless the authorization is terminated or revoked.  Performed at [facility], [address] [place], [place] [address]      Scheduled Meds: . amiodarone  200 mg Oral Daily  . apixaban  2.5 mg Oral BID  . carvedilol  3.125 mg Oral BID WC  . ferrous sulfate  325 mg Oral Q breakfast  . guaiFENesin  600 mg Oral BID  . ipratropium-albuterol  3 mL Nebulization TID  . methocarbamol  500 mg Oral TID  . pravastatin  20 mg Oral QPM  . sodium chloride flush  3 mL Intravenous Q12H  . sodium chloride flush  3 mL Intravenous Q12H  . tamsulosin  0.4 mg Oral Daily   Continuous Infusions: . sodium chloride    . azithromycin 500 mg (05/23/20 1656)  . cefTRIAXone (ROCEPHIN)  IV 1 g (05/24/20 0645)    Procedures/Studies: CT Angio Chest PE W and/or Wo Contrast  Result Date: 05/22/2020 CLINICAL DATA:  85 year old male with chest pain. Weakness. Shortness of breath. Abnormal D-dimer. EXAM: CT ANGIOGRAPHY CHEST WITH  CONTRAST TECHNIQUE: Multidetector CT imaging of the chest was performed using the standard protocol during bolus administration of intravenous contrast. Multiplanar CT image reconstructions and MIPs were obtained to evaluate the vascular anatomy. CONTRAST:  20mL OMNIPAQUE IOHEXOL 350 MG/ML SOLN COMPARISON:  Portable chest earlier today. Chest radiographs 07/09/2015. Neck CT, CTA 07/03/2017 and earlier. FINDINGS: Cardiovascular: Good contrast bolus timing in the pulmonary arterial tree. No focal filling defect identified in the pulmonary arteries to suggest acute pulmonary embolism. Mild to moderate enlargement of central pulmonary arteries relative to the aorta (series 3, image 147). Calcified aortic atherosclerosis. Calcified coronary artery atherosclerosis. Cardiomegaly. Left chest cardiac pacemaker  type device. No pericardial effusion. Mediastinum/Nodes: Negative.  No lymphadenopathy. Lungs/Pleura: Major airways are patent. Widespread although fairly symmetric peripheral, peribronchial, and also in the lung apices this is new or progressed since 2019. No consolidation. Trace layering pleural fluid greater on the left. And dependent pulmonary ground-glass opacity in both lungs Upper Abdomen: Negative for age visible upper abdominal viscera Musculoskeletal: Chronic T12 superior endplate compression suspected to be stable since 2017 radiographs. No acute osseous abnormality identified. Review of the MIP images confirms the above findings. IMPRESSION: 1. Negative for pulmonary embolus. 2. Widespread abnormal although nonspecific pulmonary ground-glass opacity with trace layering pleural fluid. Acute viral/atypical respiratory infection not excluded. Chronic interstitial lung disease is also a consideration. 3. Cardiomegaly with mild to moderate enlargement of the central pulmonary arteries raising the possibility of pulmonary artery hypertension. Aortic Atherosclerosis (ICD10-I70.0). Electronically Signed   By: Genevie Ann M.D.   On: 05/22/2020 14:38   DG Chest Port 1 View  Result Date: 05/22/2020 CLINICAL DATA:  Weakness and shortness of breath. EXAM: PORTABLE CHEST 1 VIEW COMPARISON:  Chest x-ray 07/09/2015. FINDINGS: AICD in stable position. Cardiomegaly with mild bilateral interstitial prominence suggesting interstitial edema. Pneumonitis cannot be excluded. No pleural effusion or pneumothorax. IMPRESSION: AICD in stable position. Cardiomegaly with mild bilateral interstitial prominence suggesting interstitial edema. Pneumonitis cannot be excluded. Electronically Signed   By: Marcello Moores  Register   On: 05/22/2020 11:10   CUP PACEART REMOTE DEVICE CHECK  Result Date: 05/06/2020 Scheduled remote reviewed. Normal device function.  1 SVT logged 7 minutes 12 seconds w/ rates 160's bpm 1 ATR - same event as above 10 PMT successfully terminated by PMT algorithm Next remote 91 days.   Orson Eva, DO  [facility]  If 7PM-7AM, please contact night-coverage [email or web address] Password [facility] 05/24/2020, 9:44 AM   LOS: 2 days

## 2020-05-25 ENCOUNTER — Inpatient Hospital Stay (HOSPITAL_COMMUNITY): Payer: Medicare Other

## 2020-05-25 DIAGNOSIS — N1832 Chronic kidney disease, stage 3b: Secondary | ICD-10-CM | POA: Diagnosis not present

## 2020-05-25 DIAGNOSIS — I482 Chronic atrial fibrillation, unspecified: Secondary | ICD-10-CM | POA: Diagnosis not present

## 2020-05-25 DIAGNOSIS — I4891 Unspecified atrial fibrillation: Secondary | ICD-10-CM | POA: Diagnosis not present

## 2020-05-25 DIAGNOSIS — J9601 Acute respiratory failure with hypoxia: Secondary | ICD-10-CM | POA: Diagnosis not present

## 2020-05-25 DIAGNOSIS — J189 Pneumonia, unspecified organism: Secondary | ICD-10-CM

## 2020-05-25 DIAGNOSIS — I5042 Chronic combined systolic (congestive) and diastolic (congestive) heart failure: Secondary | ICD-10-CM

## 2020-05-25 DIAGNOSIS — I5022 Chronic systolic (congestive) heart failure: Secondary | ICD-10-CM | POA: Diagnosis not present

## 2020-05-25 LAB — COMPREHENSIVE METABOLIC PANEL
ALT: 19 U/L (ref 0–44)
AST: 24 U/L (ref 15–41)
Albumin: 2.1 g/dL — ABNORMAL LOW (ref 3.5–5.0)
Alkaline Phosphatase: 62 U/L (ref 38–126)
Anion gap: 11 (ref 5–15)
BUN: 19 mg/dL (ref 8–23)
CO2: 24 mmol/L (ref 22–32)
Calcium: 8.2 mg/dL — ABNORMAL LOW (ref 8.9–10.3)
Chloride: 95 mmol/L — ABNORMAL LOW (ref 98–111)
Creatinine, Ser: 1.36 mg/dL — ABNORMAL HIGH (ref 0.61–1.24)
GFR, Estimated: 49 mL/min — ABNORMAL LOW (ref >60)
Glucose, Bld: 103 mg/dL — ABNORMAL HIGH (ref 70–99)
Potassium: 3.9 mmol/L (ref 3.5–5.1)
Sodium: 130 mmol/L — ABNORMAL LOW (ref 135–145)
Total Bilirubin: 0.7 mg/dL (ref 0.3–1.2)
Total Protein: 6 g/dL — ABNORMAL LOW (ref 6.5–8.1)

## 2020-05-25 LAB — CBC WITH DIFFERENTIAL/PLATELET
Abs Immature Granulocytes: 0.11 10*3/uL — ABNORMAL HIGH (ref 0.00–0.07)
Basophils Absolute: 0 10*3/uL (ref 0.0–0.1)
Basophils Relative: 0 %
Eosinophils Absolute: 0 10*3/uL (ref 0.0–0.5)
Eosinophils Relative: 0 %
HCT: 30.6 % — ABNORMAL LOW (ref 39.0–52.0)
Hemoglobin: 10.2 g/dL — ABNORMAL LOW (ref 13.0–17.0)
Immature Granulocytes: 1 %
Lymphocytes Relative: 8 %
Lymphs Abs: 1 10*3/uL (ref 0.7–4.0)
MCH: 32.4 pg (ref 26.0–34.0)
MCHC: 33.3 g/dL (ref 30.0–36.0)
MCV: 97.1 fL (ref 80.0–100.0)
Monocytes Absolute: 1.5 10*3/uL — ABNORMAL HIGH (ref 0.1–1.0)
Monocytes Relative: 12 %
Neutro Abs: 9.4 10*3/uL — ABNORMAL HIGH (ref 1.7–7.7)
Neutrophils Relative %: 79 %
Platelets: 480 10*3/uL — ABNORMAL HIGH (ref 150–400)
RBC: 3.15 MIL/uL — ABNORMAL LOW (ref 4.22–5.81)
RDW: 12.4 % (ref 11.5–15.5)
WBC: 12 10*3/uL — ABNORMAL HIGH (ref 4.0–10.5)
nRBC: 0 % (ref 0.0–0.2)

## 2020-05-25 LAB — LEGIONELLA PNEUMOPHILA SEROGP 1 UR AG: L. pneumophila Serogp 1 Ur Ag: NEGATIVE

## 2020-05-25 LAB — RESPIRATORY PANEL BY PCR

## 2020-05-25 LAB — C-REACTIVE PROTEIN: CRP: 16.6 mg/dL — ABNORMAL HIGH

## 2020-05-25 LAB — BLOOD GAS, ARTERIAL
Acid-Base Excess: 1.3 mmol/L (ref 0.0–2.0)
Bicarbonate: 25.6 mmol/L (ref 20.0–28.0)
FIO2: 52
O2 Saturation: 93.3 %
Patient temperature: 37.1
pCO2 arterial: 37.9 mmHg (ref 32.0–48.0)
pH, Arterial: 7.436 (ref 7.350–7.450)
pO2, Arterial: 68.2 mmHg — ABNORMAL LOW (ref 83.0–108.0)

## 2020-05-25 LAB — PROCALCITONIN: Procalcitonin: 0.43 ng/mL

## 2020-05-25 LAB — SEDIMENTATION RATE: Sed Rate: 18 mm/hr — ABNORMAL HIGH (ref 0–16)

## 2020-05-25 LAB — ANA W/REFLEX IF POSITIVE: Anti Nuclear Antibody (ANA): NEGATIVE

## 2020-05-25 MED ORDER — FUROSEMIDE 10 MG/ML IJ SOLN
40.0000 mg | Freq: Once | INTRAMUSCULAR | Status: AC
  Administered 2020-05-25: 40 mg via INTRAVENOUS
  Filled 2020-05-25: qty 4

## 2020-05-25 MED ORDER — METHYLPREDNISOLONE SODIUM SUCC 125 MG IJ SOLR
80.0000 mg | Freq: Two times a day (BID) | INTRAMUSCULAR | Status: DC
  Administered 2020-05-25 – 2020-05-27 (×6): 80 mg via INTRAVENOUS
  Filled 2020-05-25 (×6): qty 2

## 2020-05-25 NOTE — Consult Note (Signed)
NAME:  Max Cole, MRN:  [id number], DOB:  Oct 26, 1927, LOS: 3 ADMISSION DATE:  05/22/2020, CONSULTATION DATE:  05/25/20  REFERRING MD:  Tat/ Triad, CHIEF COMPLAINT:  Sob/ resp failure with GG changes   Brief History   86 yowm remote smoker fully vaccinated maint on amiodarone since aug 2021  with several weeks to maybe a month of increasing fatgiue/sob then slt feverish  1 week PTA with neg COVID testing but  with GG changes on Chest CT and deteriorating sats on high flow 02 so PCCM consulted am 11/29 with last dose of amiodarone 11/28.   History of present illness   86 y.o.malewith medical history ofsystolic CHF status postBiV PPM, CKD stage III, carotid stenosis, hyperlipidemia,PAF on apixabanand hypertension presentingapproximately 10-day history of generalized weakness, dyspnea on exertion, fevers and chills, and shortness of breath. Apparently, the patient stated that he saw his primary care provider approximately 2 to 3 weeks prior to this admission. He was placed on a prednisone taper. There is no improvement of his symptoms. He took some over-the-counter NyQuil with no improvement. He called his PCP again, the patient was placed on empiric dose of Tamiflu. He continued to have malaise and shortness of breath. As result, he presented for further evaluation. He denied any nausea, vomiting, diarrhea, chest pain, hemoptysis, abdominal pain, dysuria, hematuria, hematochezia, melena. The patient has not started any other new medications except for Robaxin and prednisone. He denies any worsening lower extremity edema, orthopnea, chest pain, headache, sore throat.  He has one cat, no exotic pets.  No travels. In the emergency department, the patient was febrile up to 100.15F. He was hemodynamically stable with oxygen saturation down to 80% on room air. He was placed on 4 L nasal cannula with saturation 93-94%. CTA chest was negative for PE but showed bilateral widespread  groundglass opacities there was cardiomegaly with enlarged central pulmonary arteries. There is a chronic T12 endplate fracture which was stable compared to 2017.  Some assoc nasal congestion / ha esp since starting 02 as inpt/ assoc mild ST  No obvious oatterns in day to day or daytime variability or assoc excess/ purulent sputum or mucus plugs or hemoptysis or cp or chest tightness, subjective wheeze or overt  hb symptoms.   Typically sleeping without nocturnal  or early am exacerbation  of respiratory  c/o's or need for noct saba. Also denies any obvious fluctuation of symptoms with weather or environmental changes or other aggravating or alleviating factors except as outlined above   No unusual exposure hx or h/o childhood pna/ asthma or knowledge of premature birth.  Current Allergies, Complete Past Medical History, Past Surgical History, Family History, and Social History were reviewed in [employer] record.  ROS  The following are not active complaints unless bolded Hoarseness, sore throat, dysphagia, dental problems, itching, sneezing,  nasal congestion or discharge of excess mucus or purulent secretions, ear ache,   fever, chills, sweats, unintended wt loss or wt gain, classically pleuritic or exertional cp,  orthopnea pnd or arm/hand swelling  or leg swelling, presyncope, palpitations, abdominal pain, anorexia, nausea, vomiting, diarrhea  or change in bowel habits or change in bladder habits, change in stools or change in urine, dysuria, hematuria,  rash, arthralgias, visual complaints, headache, numbness, weakness or ataxia or problems with walking or coordination,  change in mood or  memory.             Past Medical History  Systolic CHF status postBiV PPM,  CKD stage III, carotid stenosis, hyperlipidemia,PAF on apixabanand hypertension  Significant Hospital Events   Last dose of amiodarone 11/28 Solumedrol started 11/29 am   Consults:  PCCM    Procedures:     Significant Diagnostic Tests:  ESR 11/29  = 18    Micro Data:  BC x 2 11/26  >>> Resp PCR panel 11/26  neg  Resp PCR panel 11/27 neg Urinary strep  11/27  Neg  Urine legionella 11/27 >>> Resp PCR panel  11/128  neg     Antimicrobials:   Zmax 11/26 >>> Rocephin 11/27 >>>   Scheduled Meds: . apixaban  2.5 mg Oral BID  . carvedilol  3.125 mg Oral BID WC  . ferrous sulfate  325 mg Oral Q breakfast  . guaiFENesin  600 mg Oral BID  . ipratropium-albuterol  3 mL Nebulization TID  . methocarbamol  500 mg Oral TID  . methylPREDNISolone (SOLU-MEDROL) injection  80 mg Intravenous Q12H  . pravastatin  20 mg Oral QPM  . sodium chloride flush  3 mL Intravenous Q12H  . sodium chloride flush  3 mL Intravenous Q12H  . tamsulosin  0.4 mg Oral Daily   Continuous Infusions: . sodium chloride    . azithromycin 500 mg (05/24/20 1629)  . cefTRIAXone (ROCEPHIN)  IV 1 g (05/25/20 0852)   PRN Meds:.sodium chloride, acetaminophen **OR** acetaminophen, albuterol, bisacodyl, ondansetron **OR** ondansetron (ZOFRAN) IV, polyethylene glycol, sodium chloride, sodium chloride flush, traZODone  Interim history/subjective:  Seems better p diuresis am 05/25/2020 / no cough or cp   Objective   Blood pressure (!) 148/63, pulse 87, temperature 98.8 F (37.1 C), temperature source Oral, resp. rate 20, height 6' 1" (1.854 m), weight 84.1 kg, SpO2 96 %.    FiO2 (%):  [100 %] 100 %   Intake/Output Summary (Last 24 hours) at 05/25/2020 0838 Last data filed at 05/25/2020 0300 Gross per 24 hour  Intake 1290 ml  Output 1600 ml  Net -310 ml   Filed Weights   05/23/20 0301 05/24/20 0150 05/25/20 0610  Weight: 80.8 kg 80.9 kg 84.1 kg    Examination: Tmax  99.6  sats 92% on 8lpm NP  General: elderly wm nad at 60 degrees hob HENT: neg/ no cx nodes Lungs: minimal insp crackles both bases  Cardiovascular: RRR no s3  Abdomen: soft/ benign Extremities: warm w/o cyanosis clubbing or  edema Neuro: sensorium intact, no deficits     I personally reviewed images and agree with radiology impression as follows:  CXR:   11/29 portable Patchy increased density superimposed on likely chronic interstitial lung disease. This may reflect superimposed pneumonia or edema.   Resolved Hospital Problem list      Assessment & Plan:  1)  Subacute respiratory illness with low grade fever/ GG changes on CT and worsening gas exchange while on adequate coverage for CAP and amiodarone for afib    Miscellaneous:Alv microlithiasis, alv proteinosis, asp, bronchiectais, BOOP   ARDS/ AIP Occupational dz/ HSP Neoplasm Infection Note very low PCT on admit/ covid PCR neg x 3 so doubt  Drug   Amiodarone leading suspect and I d/c'd it prior to dose for 11/29  Pulmonary emboli, Protein disorders Edema - bnp not that impressive but hard to exclude and does feel better p lasix/ 2liter diuresis  Eosinophilic dz - note Eos 0.0  Sarcoidosis Connective tissue dz > can do full screen if esr elevated though in absence of active arthritis/synovitis a 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 / amiodarone   toxicity over occult collagen vasc dz  Hist X / Hemorrhage Idiopathic      Labs   CBC: Recent Labs  Lab 05/22/20 1052 05/23/20 0729 05/23/20 1137 05/24/20 0653 05/25/20 0706  WBC 9.9 9.3 9.3 12.0* 12.0*  NEUTROABS 7.7  --   --  9.7* 9.4*  HGB 11.3* 9.6* 10.1* 10.2* 10.2*  HCT 34.4* 29.0* 30.7* 31.6* 30.6*  MCV 98.6 99.0 99.7 100.0 97.1  PLT 343 327 328 402* 480*    Basic Metabolic Panel: Recent Labs  Lab 05/22/20 1052 05/23/20 0729 05/24/20 0653 05/25/20 0706  NA 134* 135 131* 130*  K 3.9 4.0 3.9 3.9  CL 98 101 97* 95*  CO2 _0 GLUCOSE 101* 97 95 103*  BUN 21 22 25* 19  CREATININE 1.72* 1.46* 1.47* 1.36*  CALCIUM 8.5* 8.1* 8.1* 8.2*  MG  --   --  1.8  --    GFR: Estimated Creatinine Clearance: 40.8 mL/min (A) (by C-G formula based on SCr of 1.36 mg/dL (H)). Recent Labs  Lab  05/22/20 1052 05/22/20 1052 05/23/20 0729 05/23/20 1137 05/24/20 0653 05/25/20 0706  PROCALCITON  --   --  0.13  --  0.23 0.43  WBC 9.9   < > 9.3 9.3 12.0* 12.0*  LATICACIDVEN 1.3  --   --   --   --   --    < > = values in this interval not displayed.    Liver Function Tests: Recent Labs  Lab 05/22/20 1052 05/23/20 0729 05/24/20 0942 05/25/20 0706  AST _1 ALT _2 ALKPHOS 70 55 58 62  BILITOT 0.5 0.5 0.5 0.7  PROT 6.8 5.4* 5.9* 6.0*  ALBUMIN 2.7* 2.1* 2.3* 2.1*   No results for input(s): LIPASE, AMYLASE in the last 168 hours. No results for input(s): AMMONIA in the last 168 hours.  ABG No results found for: PHART, PCO2ART, PO2ART, HCO3, TCO2, ACIDBASEDEF, O2SAT   Coagulation Profile: No results for input(s): INR, PROTIME in the last 168 hours.  Cardiac Enzymes: No results for input(s): CKTOTAL, CKMB, CKMBINDEX, TROPONINI in the last 168 hours.  HbA1C: No results found for: HGBA1C  CBG: No results for input(s): GLUCAP in the last 168 hours.   Past Medical History  He,  has a past medical history of Atrial fibrillation ([place]), Bradycardia, Cardiomyopathy (2005), Carotid artery occlusion, Cerebrovascular disease, Chronic kidney disease, Hyperlipidemia (10/15/2011), Left bundle branch block, Prostate carcinoma ([place]), and Syncope.   Surgical History    Past Surgical History:  Procedure Laterality Date  . CATARACT EXTRACTION     Right  . COLONOSCOPY N/A 08/16/2012   Procedure: COLONOSCOPY;  Surgeon: Rogene Houston, MD;  Location: [facility];  Service: Endoscopy;  Laterality: N/A;  930  . COLONOSCOPY N/A 10/21/2015   Procedure: COLONOSCOPY;  Surgeon: Rogene Houston, MD;  Location: [facility];  Service: Endoscopy;  Laterality: N/A;  1200  . COLONOSCOPY W/ POLYPECTOMY  2010   Diverticulosis  . EP IMPLANTABLE DEVICE N/A 07/03/2015   Procedure: BiV Pacemaker Insertion CRT-P;  Surgeon: Evans Lance, MD;  Location: [place] [facility];  Service:  Cardiovascular;  Laterality: N/A;  . EYE SURGERY Right    Cataract  . HERNIA REPAIR       Social History   reports that he quit smoking about 46 years ago. He has a 30.00 pack-year smoking history. He has never used smokeless tobacco. He reports current alcohol use  of about 5.0 standard drinks of alcohol per week. He reports that he does not use drugs.   Family History   His family history includes Hypertension in his mother.   Allergies No Known Allergies   Home Medications  Prior to Admission medications   Medication Sig Start Date End Date Taking? Authorizing Provider  acetaminophen (TYLENOL) 500 MG tablet Take 500 mg by mouth every 6 (six) hours as needed.   Yes [provider]  amiodarone (PACERONE) 200 MG tablet Take 1 tablet (200 mg total) by mouth daily. 02/29/20 05/22/20 Yes Shah, Pratik D, DO  apixaban (ELIQUIS) 2.5 MG TABS tablet Take 1 tablet (2.5 mg total) by mouth 2 (two) times daily. 02/07/20  Yes Shah, Pratik D, DO  carvedilol (COREG) 3.125 MG tablet TAKE (1) TABLET BY MOUTH TWICE DAILY WITH A MEAL. Patient taking differently: Take 3.125 mg by mouth 2 (two) times daily with a meal.  04/16/20  Yes Evans Lance, MD  ferrous sulfate 325 (65 FE) MG tablet Take 325 mg by mouth daily with breakfast.   Yes [provider]  methocarbamol (ROBAXIN) 500 MG tablet Take 1 tablet (500 mg total) by mouth 3 (three) times daily. Patient taking differently: Take 500 mg by mouth in the morning and at bedtime.  04/27/20  Yes Carole Civil, MD  oseltamivir (TAMIFLU) 75 MG capsule Take 75 mg by mouth 2 (two) times daily. 05/18/20  Yes [provider]  pravastatin (PRAVACHOL) 20 MG tablet Take 1 tablet (20 mg total) by mouth every evening. 04/17/20  Yes Evans Lance, MD  Pseudoeph-Doxylamine-DM-APAP (NYQUIL PO) Take 1 tablet by mouth daily.   Yes [provider]  predniSONE (STERAPRED UNI-PAK 48 TAB) 10 MG (48) TBPK tablet Take by mouth  daily. Patient not taking: Reported on 05/22/2020 04/27/20   Carole Civil, MD  tamsulosin (FLOMAX) 0.4 MG CAPS capsule Take 0.4 mg by mouth See admin instructions. Every other day 04/17/20   [provider]       Christinia Gully, MD Pulmonary and [place] [phone]   After 7:00 pm call Elink  [phone]

## 2020-05-25 NOTE — Evaluation (Signed)
Physical Therapy Evaluation Patient Details Name: Max Cole MRN: [id number] DOB: 1927-09-23 Today's Date: 05/25/2020   History of Present Illness  86 y.o. male with medical history of systolic CHF status post BiV PPM, CKD stage III, carotid stenosis, hyperlipidemia, PAF on apixaban and hypertension presenting approximately 10-day history of generalized weakness, dyspnea on exertion, fevers and chills, and shortness of breath.  Apparently, the patient stated that he saw his primary care provider approximately 2 to 3 weeks prior to this admission.  He was placed on a prednisone taper.  There is no improvement of his symptoms.  He took some over-the-counter NyQuil with no improvement.  He called his PCP again, the patient was placed on empiric dose of Tamiflu.  He continued to have malaise and shortness of breath.  As result, he presented for further evaluation.  He denied any nausea, vomiting, diarrhea, chest pain, hemoptysis, abdominal pain, dysuria, hematuria, hematochezia, melena.  The patient has not started any other new medications except for Robaxin and prednisone.  He denies any worsening lower extremity edema, orthopnea, chest pain, headache, sore throat.  He has one cat, no exotic pets.  No travels.In the emergency department, the patient was febrile up to 100.32F.  He was hemodynamically stable with oxygen saturation down to 80% on room air.  He was placed on 4 L nasal cannula with saturation 93-94%.  CTA chest was negative for PE but showed bilateral widespread groundglass opacities there was cardiomegaly with enlarged central pulmonary arteries.  There is a chronic T12 endplate fracture which was stable compared to 2017.    Clinical Impression  Pt admitted with above diagnosis. Patient primary caregiver for his wife prior to admission. Patient's daughter present throughout session. Patient requires assistance for line management for safety and exhibits difficulty with motor planning in the  unfamiliar environment. Patient requires assistance with all bed mobility, transfers and ambulation with RW and 8 LPM O2. Patient exhibit dyspnea and increased impulsivity with exertion. Upon returning from ambulation with RW and 8LPM O2, patient's O2 sats were in the 50s and decreased palor was noted. Nursing and family members present. Patient's O2 sats recovered into the 90s within 5 minutes with cues to lie still and cease speaking. Pt currently with functional limitations due to the deficits listed below (see PT Problem List). Pt will benefit from skilled PT to increase their independence and safety with mobility to allow discharge to the venue listed below.       Follow Up Recommendations Home health PT;SNF;Supervision for mobility/OOB;Supervision/Assistance - 24 hour    Equipment Recommendations  None recommended by PT    Recommendations for Other Services       Precautions / Restrictions Precautions Precautions: Fall Restrictions Weight Bearing Restrictions: No      Mobility  Bed Mobility Overal bed mobility: Needs Assistance Bed Mobility: Supine to Sit     Supine to sit: Min guard;Min assist     General bed mobility comments: impulsive, needs assistance with line management and problem solving    Transfers Overall transfer level: Needs assistance Equipment used: Rolling walker (2 wheeled) Transfers: Sit to/from [facility] Sit to Stand: Min assist Stand pivot transfers: Min assist       General transfer comment: impulsive, verbal cues to slow down, sequencing of steps and placement of hands  Ambulation/Gait Ambulation/Gait assistance: Min assist;Min guard Gait Distance (Feet): 75 Feet Assistive device: Rolling walker (2 wheeled) Gait Pattern/deviations: Step-through pattern;Decreased step length - right;Decreased step length - left;Decreased stride  length;Drifts right/left;Wide base of support Gait velocity: varying   General Gait Details: 8  LPM O2 with RW using varying gait speed, unsteady left and right with veering path, impulsive noted, 2 losses of balance requiring manual assistance to regain balance; O2 sats in the 50's upon sitting with decreased palor noted  Stairs      Wheelchair Mobility    Modified Rankin (Stroke Patients Only)       Balance Overall balance assessment: Needs assistance Sitting-balance support: Bilateral upper extremity supported;Feet supported Sitting balance-Leahy Scale: Good     Standing balance support: No upper extremity supported;During functional activity Standing balance-Leahy Scale: Poor Standing balance comment: fair with RW in standin position        Pertinent Vitals/Pain Pain Assessment: No/denies pain    Home Living Family/patient expects to be discharged to:: Private residence Living Arrangements: Spouse/significant other Available Help at Discharge: Family (wife, 2 daughters and son) Type of Home: House Home Access: Stairs to enter Entrance Stairs-Rails: Right Entrance Stairs-Number of Steps: 4 Home Layout: Two level;Able to live on main level with bedroom/bathroom Home Equipment: Gilford Rile - 2 wheels;Cane - single point      Prior Function Level of Independence: Independent         Comments: continues to drive     Hand Dominance   Dominant Hand: Right    Extremity/Trunk Assessment   Upper Extremity Assessment Upper Extremity Assessment: Overall WFL for tasks assessed    Lower Extremity Assessment Lower Extremity Assessment: Overall WFL for tasks assessed    Cervical / Trunk Assessment Cervical / Trunk Assessment: Kyphotic  Communication   Communication: HOH  Cognition Arousal/Alertness: Awake/alert Behavior During Therapy: WFL for tasks assessed/performed Overall Cognitive Status: Within Functional Limits for tasks assessed  Difficulty with motor planning and impulsivity noted      General Comments      Exercises     Assessment/Plan     PT Assessment Patient needs continued PT services  PT Problem List Decreased mobility;Decreased safety awareness;Decreased activity tolerance;Decreased balance;Decreased knowledge of use of DME;Decreased cognition       PT Treatment Interventions DME instruction;Therapeutic exercise;Balance training;Gait training;Stair training;Functional mobility training;Cognitive remediation;Therapeutic activities;Patient/family education    PT Goals (Current goals can be found in the Care Plan section)  Acute Rehab PT Goals Patient Stated Goal: Go to rehab prior to going home as 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 PT Goal Formulation: With patient/family Time For Goal Achievement: 06/08/20 Potential to Achieve Goals: Fair    Frequency Min 3X/week   Barriers to discharge           AM-PAC PT "6 Clicks" Mobility  Outcome Measure Help needed turning from your back to your side while in a flat bed without using bedrails?: A Little Help needed moving from lying on your back to sitting on the side of a flat bed without using bedrails?: A Little Help needed moving to and from a bed to a chair (including a wheelchair)?: A Lot Help needed standing up from a chair using your arms (e.g., wheelchair or bedside chair)?: A Little Help needed to walk in hospital room?: A Lot Help needed climbing 3-5 steps with a railing? : A Lot 6 Click Score: 15    End of Session Equipment Utilized During Treatment: Oxygen Activity Tolerance: Patient tolerated treatment well;Patient limited by fatigue Patient left: in bed;with family/visitor present;with call bell/phone within reach;with bed alarm set Nurse Communication: Mobility status PT Visit Diagnosis: Unsteadiness on feet (R26.81);Other abnormalities  of gait and mobility (R26.89);Difficulty in walking, not elsewhere classified (R26.2)    Time: 3685-9923 PT Time Calculation (min) (ACUTE ONLY): 45 min   Charges:   PT Evaluation $PT Eval Moderate  Complexity: 1 Mod PT Treatments $Therapeutic Activity: 8-22 mins        Floria Raveling. Hartnett-Rands, MS, PT Per [place] #[id number] 05/25/2020, 1:59 PM

## 2020-05-25 NOTE — Progress Notes (Signed)
PROGRESS NOTE  CANNEN DUPRAS FYB:[id number] DOB: [birth year] DOA: 05/22/2020 PCP: Asencion Noble, MD  Brief History: 86 y.o.malewith medical history ofsystolic CHF status postBiV PPM, CKD stage III, carotid stenosis, hyperlipidemia,PAF on apixabanand hypertension presentingapproximately 10-day history of generalized weakness, dyspnea on exertion, fevers and chills, and shortness of breath. Apparently, the patient stated that he saw his primary care provider approximately 2 to 3 weeks prior to this admission. He was placed on a prednisone taper. There is no improvement of his symptoms. He took some over-the-counter NyQuil with no improvement. He called his PCP again, the patient was placed on empiric dose of Tamiflu. He continued to have malaise and shortness of breath. As result, he presented for further evaluation. He denied any nausea, vomiting, diarrhea, chest pain, hemoptysis, abdominal pain, dysuria, hematuria, hematochezia, melena. The patient has not started any other new medications except for Robaxin and prednisone. He denies any worsening lower extremity edema, orthopnea, chest pain, headache, sore throat.  He has one cat, no exotic pets.  No travels. In the emergency department, the patient was febrile up to 100.45F. He was hemodynamically stable with oxygen saturation down to 80% on room air. He was placed on 4 L nasal cannula with saturation 93-94%. CTA chest was negative for PE but showed bilateral widespread groundglass opacities there was cardiomegaly with enlarged central pulmonary arteries. There is a chronic T12 endplate fracture which was stable compared to 2017.  Assessment/Plan: Acute respiratory failure with hypoxia -Secondary to atypical pneumonia -Had been stable on 4 L nasal cannula -CTA chest as discussed above--NEG PE -11/29 AM--continued oxygen desaturation-->placed on NRB+HFNC10L -repeat CXR -obtain ABG -COVID-19 RT-PCR was negative  x2 -Influenza PCR was negative x2 -viral respiratory panel--neg -question if GGO represents component of edema -give lasix 40 mg IV x 1 -consult pulmonary  Atypical pneumonia/lobar pneumonia -Urine Legionella antigen--pending -Urine Streptococcus pneumonia antigen--neg -Viral respiratory panel--neg -Continue azithromycin and ceftriaxone -Lactic acid 1.3  FUO -UA neg for pyuria -CT chest as discussed above -blood cultures remain neg -CT abd/pelvis--sm bilateral pleural eff; unchanged GGO; mild fat stranding around sigmoid colon; ur bladder wall thickening  Paroxysmal atrial fibrillation -CHADSVASc = 5 -continue apixaban -currently in sinus -continue amiodarone -continue coreg  CKD stage IIIb -Baseline creatinine 1.6-1.7 -A.m. BMP  Chronic systolic and diastolic CHF -Clinically euvolemic -05/21/2015 echo EF 20 to 25%, grade 1 DD -02/07/2020 echo EF 50-55%, no WMA, grade 1 DD, mild AR -Clinically compensated presently -Daily weights--standing weight on 11/29--179 lb  Left carotid stenosis -Continue apixaban  Hyperlipidemia -Continue statin  BPH -Continue tamsulosin        Status is: Inpatient  Remains inpatient appropriate because:IV treatments appropriate due to intensity of illness or inability to take PO   Dispo: The patient is from:Home Anticipated d/c is NI:DPOE Anticipated d/c date is: 2 days Patient currently is not medically stable to d/c.        Family Communication:Daughter updatedat bedside 11/29  Consultants:none  Code Status: FULL  DVT Prophylaxis:apixaban   Procedures: As Listed in Progress Note Above  Antibiotics: Ceftriaxone 11/27>> azithro11/26>>    Total time spent 35 minutes.  Greater than 50% spent face to face counseling and coordinating care.     Subjective: Patient fells slightly more sob.  Patient denies fevers, chills,  headache, chest pain,  nausea, vomiting, diarrhea, abdominal pain, dysuria, hematuria, hematochezia, and melena.   Objective: Vitals:   05/24/20 1700 05/24/20 2003 05/25/20 0610 05/25/20 0630  BP: 132/61 (!) 159/65 (!) 148/63   Pulse: 80 88 86   Resp: 18 18 20    Temp: 98.1 F (36.7 C) 99.6 F (37.6 C) 98.8 F (37.1 C)   TempSrc: Oral Oral Oral   SpO2: 93% 92% (!) 61% 90%  Weight:   84.1 kg   Height:        Intake/Output Summary (Last 24 hours) at 05/25/2020 0748 Last data filed at 05/25/2020 0300 Gross per 24 hour  Intake 1290 ml  Output 1600 ml  Net -310 ml   Weight change: 3.2 kg Exam:   General:  Pt is alert, follows commands appropriately, not in acute distress  HEENT: No icterus, No thrush, No neck mass, [place]/AT  Cardiovascular: RRR, S1/S2, no rubs, no gallops  Respiratory: bilateral crackles. No wheeze  Abdomen: Soft/+BS, non tender, non distended, no guarding  Extremities: No edema, No lymphangitis, No petechiae, No rashes, no synovitis   Data Reviewed: I have personally reviewed following labs and imaging studies Basic Metabolic Panel: Recent Labs  Lab 05/22/20 1052 05/23/20 0729 05/24/20 0653  NA 134* 135 131*  K 3.9 4.0 3.9  CL 98 101 97*  CO2 27 26 26   GLUCOSE 101* 97 95  BUN 21 22 25*  CREATININE 1.72* 1.46* 1.47*  CALCIUM 8.5* 8.1* 8.1*  MG  --   --  1.8   Liver Function Tests: Recent Labs  Lab 05/22/20 1052 05/23/20 0729 05/24/20 0942  AST 22 20 25   ALT 18 17 19   ALKPHOS 70 55 58  BILITOT 0.5 0.5 0.5  PROT 6.8 5.4* 5.9*  ALBUMIN 2.7* 2.1* 2.3*   No results for input(s): LIPASE, AMYLASE in the last 168 hours. No results for input(s): AMMONIA in the last 168 hours. Coagulation Profile: No results for input(s): INR, PROTIME in the last 168 hours. CBC: Recent Labs  Lab 05/22/20 1052 05/23/20 0729 05/23/20 1137 05/24/20 0653  WBC 9.9 9.3 9.3 12.0*  NEUTROABS 7.7  --   --  9.7*  HGB 11.3* 9.6* 10.1* 10.2*  HCT 34.4* 29.0*  30.7* 31.6*  MCV 98.6 99.0 99.7 100.0  PLT 343 327 328 402*   Cardiac Enzymes: No results for input(s): CKTOTAL, CKMB, CKMBINDEX, TROPONINI in the last 168 hours. BNP: Invalid input(s): POCBNP CBG: No results for input(s): GLUCAP in the last 168 hours. HbA1C: No results for input(s): HGBA1C in the last 72 hours. Urine analysis:    Component Value Date/Time   COLORURINE YELLOW 05/22/2020 [place] 05/22/2020 1041   LABSPEC 1.015 05/22/2020 1041   PHURINE 6.0 05/22/2020 1041   GLUCOSEU NEGATIVE 05/22/2020 1041   GLUCOSEU neg 02/26/2009 0000   HGBUR NEGATIVE 05/22/2020 [place] 05/22/2020 [place] 05/22/2020 1041   PROTEINUR 30 (A) 05/22/2020 1041   NITRITE NEGATIVE 05/22/2020 1041   LEUKOCYTESUR NEGATIVE 05/22/2020 1041   Sepsis Labs: @LABRCNTIP (procalcitonin:4,lacticidven:4) ) Recent Results (from the past 240 hour(s))  Resp Panel by RT-PCR (Flu A&B, Covid) Nasopharyngeal Swab     Status: None   Collection Time: 05/22/20  9:57 AM   Specimen: Nasopharyngeal Swab; Nasopharyngeal(NP) swabs in vial transport medium  Result Value Ref Range Status   SARS Coronavirus 2 by RT PCR NEGATIVE NEGATIVE Final    Comment: (NOTE) SARS-CoV-2 target nucleic acids are NOT DETECTED.  The SARS-CoV-2 RNA is generally detectable in upper respiratory specimens during the acute phase of infection. The lowest concentration of SARS-CoV-2 viral copies this assay can detect is 138  copies/mL. A negative result does not preclude SARS-Cov-2 infection and should not be used as the sole basis for treatment or other patient management decisions. A negative result may occur with  improper specimen collection/handling, submission of specimen other than nasopharyngeal swab, presence of viral mutation(s) within the areas targeted by this assay, and inadequate number of viral copies(<138 copies/mL). A negative result must be combined with clinical  observations, patient history, and epidemiological information. The expected result is Negative.  Fact Sheet for Patients:  EntrepreneurPulse.com.au  Fact Sheet for Healthcare Providers:  IncredibleEmployment.be  This test is no t yet approved or cleared by the [place] FDA and  has been authorized for detection and/or diagnosis of SARS-CoV-2 by FDA under an Emergency Use Authorization (EUA). This EUA will remain  in effect (meaning this test can be used) for the duration of the COVID-19 declaration under Section 564(b)(1) of the Act, 21 U.S.C.section 360bbb-3(b)(1), unless the authorization is terminated  or revoked sooner.       Influenza A by PCR NEGATIVE NEGATIVE Final   Influenza B by PCR NEGATIVE NEGATIVE Final    Comment: (NOTE) The Xpert Xpress SARS-CoV-2/FLU/RSV plus assay is intended as an aid in the diagnosis of influenza from Nasopharyngeal swab specimens and should not be used as a sole basis for treatment. Nasal washings and aspirates are unacceptable for Xpert Xpress SARS-CoV-2/FLU/RSV testing.  Fact Sheet for Patients: EntrepreneurPulse.com.au  Fact Sheet for Healthcare Providers: IncredibleEmployment.be  This test is not yet approved or cleared by the [place] FDA and has been authorized for detection and/or diagnosis of SARS-CoV-2 by FDA under an Emergency Use Authorization (EUA). This EUA will remain in effect (meaning this test can be used) for the duration of the COVID-19 declaration under Section 564(b)(1) of the Act, 21 U.S.C. section 360bbb-3(b)(1), unless the authorization is terminated or revoked.  Performed at [facility], [address] [place], [place] [address]   Blood culture (routine x 2)     Status: None (Preliminary result)   Collection Time: 05/22/20 10:45 AM   Specimen: BLOOD LEFT HAND  Result Value Ref Range Status   Specimen Description   Final     BLOOD LEFT HAND BOTTLES DRAWN AEROBIC AND ANAEROBIC   Special Requests   Final    Blood Culture results may not be optimal due to an inadequate volume of blood received in culture bottles   Culture   Final    NO GROWTH 2 DAYS Performed at [facility], [address], [place], [place] [address]    Report Status PENDING  Incomplete  Blood culture (routine x 2)     Status: None (Preliminary result)   Collection Time: 05/22/20 10:52 AM   Specimen: Right Antecubital; Blood  Result Value Ref Range Status   Specimen Description   Final    RIGHT ANTECUBITAL BOTTLES DRAWN AEROBIC AND ANAEROBIC   Special Requests Blood Culture adequate volume  Final   Culture   Final    NO GROWTH 2 DAYS Performed at [facility], [address], [place], [place] [address]    Report Status PENDING  Incomplete  Resp Panel by RT-PCR (Flu A&B, Covid)     Status: None   Collection Time: 05/23/20  6:40 PM  Result Value Ref Range Status   SARS Coronavirus 2 by RT PCR NEGATIVE NEGATIVE Final    Comment: (NOTE) SARS-CoV-2 target nucleic acids are NOT DETECTED.  The SARS-CoV-2 RNA is generally detectable in upper respiratory specimens during the acute phase  of infection. The lowest concentration of SARS-CoV-2 viral copies this assay can detect is 138 copies/mL. A negative result does not preclude SARS-Cov-2 infection and should not be used as the sole basis for treatment or other patient management decisions. A negative result may occur with  improper specimen collection/handling, submission of specimen other than nasopharyngeal swab, presence of viral mutation(s) within the areas targeted by this assay, and inadequate number of viral copies(<138 copies/mL). A negative result must be combined with clinical observations, patient history, and epidemiological information. The expected result is Negative.  Fact Sheet for Patients:  EntrepreneurPulse.com.au  Fact Sheet for Healthcare Providers:   IncredibleEmployment.be  This test is no t yet approved or cleared by the [place] FDA and  has been authorized for detection and/or diagnosis of SARS-CoV-2 by FDA under an Emergency Use Authorization (EUA). This EUA will remain  in effect (meaning this test can be used) for the duration of the COVID-19 declaration under Section 564(b)(1) of the Act, 21 U.S.C.section 360bbb-3(b)(1), unless the authorization is terminated  or revoked sooner.       Influenza A by PCR NEGATIVE NEGATIVE Final   Influenza B by PCR NEGATIVE NEGATIVE Final    Comment: (NOTE) The Xpert Xpress SARS-CoV-2/FLU/RSV plus assay is intended as an aid in the diagnosis of influenza from Nasopharyngeal swab specimens and should not be used as a sole basis for treatment. Nasal washings and aspirates are unacceptable for Xpert Xpress SARS-CoV-2/FLU/RSV testing.  Fact Sheet for Patients: EntrepreneurPulse.com.au  Fact Sheet for Healthcare Providers: IncredibleEmployment.be  This test is not yet approved or cleared by the [place] FDA and has been authorized for detection and/or diagnosis of SARS-CoV-2 by FDA under an Emergency Use Authorization (EUA). This EUA will remain in effect (meaning this test can be used) for the duration of the COVID-19 declaration under Section 564(b)(1) of the Act, 21 U.S.C. section 360bbb-3(b)(1), unless the authorization is terminated or revoked.  Performed at [facility], [address] [place], [place] [address]   Respiratory Panel by PCR     Status: None   Collection Time: 05/24/20 10:04 AM   Specimen: Nasopharyngeal Swab; Respiratory  Result Value Ref Range Status   Adenovirus NOT DETECTED NOT DETECTED Final   Coronavirus 229E NOT DETECTED NOT DETECTED Final    Comment: (NOTE) The Coronavirus on the Respiratory Panel, DOES NOT test for the novel  Coronavirus (2019 nCoV)    Coronavirus HKU1 NOT DETECTED NOT  DETECTED Final   Coronavirus NL63 NOT DETECTED NOT DETECTED Final   Coronavirus OC43 NOT DETECTED NOT DETECTED Final   Metapneumovirus NOT DETECTED NOT DETECTED Final   Rhinovirus / Enterovirus NOT DETECTED NOT DETECTED Final   Influenza A NOT DETECTED NOT DETECTED Final   Influenza B NOT DETECTED NOT DETECTED Final   Parainfluenza Virus 1 NOT DETECTED NOT DETECTED Final   Parainfluenza Virus 2 NOT DETECTED NOT DETECTED Final   Parainfluenza Virus 3 NOT DETECTED NOT DETECTED Final   Parainfluenza Virus 4 NOT DETECTED NOT DETECTED Final   Respiratory Syncytial Virus NOT DETECTED NOT DETECTED Final   Bordetella pertussis NOT DETECTED NOT DETECTED Final   Chlamydophila pneumoniae NOT DETECTED NOT DETECTED Final   Mycoplasma pneumoniae NOT DETECTED NOT DETECTED Final    Comment: Performed at [facility], [place] [address] [place], [place] [address]     Scheduled Meds: . amiodarone  200 mg Oral Daily  . apixaban  2.5 mg Oral BID  . carvedilol  3.125 mg Oral  BID WC  . ferrous sulfate  325 mg Oral Q breakfast  . furosemide  40 mg Intravenous Once  . guaiFENesin  600 mg Oral BID  . ipratropium-albuterol  3 mL Nebulization TID  . methocarbamol  500 mg Oral TID  . pravastatin  20 mg Oral QPM  . sodium chloride flush  3 mL Intravenous Q12H  . sodium chloride flush  3 mL Intravenous Q12H  . tamsulosin  0.4 mg Oral Daily   Continuous Infusions: . sodium chloride    . azithromycin 500 mg (05/24/20 1629)  . cefTRIAXone (ROCEPHIN)  IV 1 g (05/24/20 0645)    Procedures/Studies: CT ABDOMEN PELVIS WO CONTRAST  Result Date: 05/24/2020 CLINICAL DATA:  Generalized weakness and fever of unknown origin. EXAM: CT ABDOMEN AND PELVIS WITHOUT CONTRAST TECHNIQUE: Multidetector CT imaging of the abdomen and pelvis was performed following the standard protocol without IV contrast. COMPARISON:  Chest CT dated 05/22/2020 and renal ultrasound dated 06/02/2009. FINDINGS: Lower chest: Cardiomegaly is  partially imaged. There are small bilateral pleural effusions which appear similar to prior exam. Diffuse bilateral ground-glass opacities appear unchanged from prior exam. Hepatobiliary: No focal liver abnormality is seen. No gallstones, gallbladder wall thickening, or biliary dilatation. Pancreas: Unremarkable. No pancreatic ductal dilatation or surrounding inflammatory changes. Spleen: Normal in size without focal abnormality. Adrenals/Urinary Tract: Adrenal glands are unremarkable. Bilateral renal cysts are noted, measuring up to 2.1 cm on the right and 1.8 cm on the left. Kidneys are normal, without renal calculi, focal lesion, or hydronephrosis. Multiple urinary bladder diverticula are noted and there is associated urinary bladder wall thickening, likely reflecting bladder outlet obstruction given the patient's large prostate. Stomach/Bowel: Stomach is within normal limits. Colonic diverticula are noted, particularly in the sigmoid colon. There is a mild fat stranding surrounding the proximal sigmoid colon (series 5 images 42-49) which may reflect acute diverticulitis. No evidence of abscess or fistula formation. No pericecal inflammatory changes are noted to suggest acute appendicitis. No evidence of bowel obstruction. Vascular/Lymphatic: Aortic atherosclerosis. No enlarged abdominal or pelvic lymph nodes. Reproductive: The prostate is enlarged, measuring 6.5 cm in transverse dimension, and indents the bladder. Other: No abdominal wall hernia or abnormality. No abdominopelvic ascites. Musculoskeletal: Chronic appearing wedge deformities are seen at T12 and L4 with less than 25% height loss. Insert degenerative IMPRESSION: 1. Mild fat stranding surrounding the proximal sigmoid colon may reflect acute diverticulitis. No evidence of complication. 2. Multiple urinary bladder diverticula with associated urinary bladder wall thickening, likely reflecting bladder outlet obstruction given the patient's large  prostate. 3. Small bilateral pleural effusions with diffuse bilateral ground-glass opacities appear unchanged from prior exam. Aortic Atherosclerosis (ICD10-I70.0). Electronically Signed   By: Zerita Boers M.D.   On: 05/24/2020 16:46   CT Angio Chest PE W and/or Wo Contrast  Result Date: 05/22/2020 CLINICAL DATA:  86 year old male with chest pain. Weakness. Shortness of breath. Abnormal D-dimer. EXAM: CT ANGIOGRAPHY CHEST WITH CONTRAST TECHNIQUE: Multidetector CT imaging of the chest was performed using the standard protocol during bolus administration of intravenous contrast. Multiplanar CT image reconstructions and MIPs were obtained to evaluate the vascular anatomy. CONTRAST:  61mL OMNIPAQUE IOHEXOL 350 MG/ML SOLN COMPARISON:  Portable chest earlier today. Chest radiographs 07/09/2015. Neck CT, CTA 07/03/2017 and earlier. FINDINGS: Cardiovascular: Good contrast bolus timing in the pulmonary arterial tree. No focal filling defect identified in the pulmonary arteries to suggest acute pulmonary embolism. Mild to moderate enlargement of central pulmonary arteries relative to the aorta (series 3, image 147). Calcified aortic  atherosclerosis. Calcified coronary artery atherosclerosis. Cardiomegaly. Left chest cardiac pacemaker type device. No pericardial effusion. Mediastinum/Nodes: Negative.  No lymphadenopathy. Lungs/Pleura: Major airways are patent. Widespread although fairly symmetric peripheral, peribronchial, and also in the lung apices this is new or progressed since 2019. No consolidation. Trace layering pleural fluid greater on the left. And dependent pulmonary ground-glass opacity in both lungs Upper Abdomen: Negative for age visible upper abdominal viscera Musculoskeletal: Chronic T12 superior endplate compression suspected to be stable since 2017 radiographs. No acute osseous abnormality identified. Review of the MIP images confirms the above findings. IMPRESSION: 1. Negative for pulmonary embolus. 2.  Widespread abnormal although nonspecific pulmonary ground-glass opacity with trace layering pleural fluid. Acute viral/atypical respiratory infection not excluded. Chronic interstitial lung disease is also a consideration. 3. Cardiomegaly with mild to moderate enlargement of the central pulmonary arteries raising the possibility of pulmonary artery hypertension. Aortic Atherosclerosis (ICD10-I70.0). Electronically Signed   By: Genevie Ann M.D.   On: 05/22/2020 14:38   DG Chest Port 1 View  Result Date: 05/22/2020 CLINICAL DATA:  Weakness and shortness of breath. EXAM: PORTABLE CHEST 1 VIEW COMPARISON:  Chest x-ray 07/09/2015. FINDINGS: AICD in stable position. Cardiomegaly with mild bilateral interstitial prominence suggesting interstitial edema. Pneumonitis cannot be excluded. No pleural effusion or pneumothorax. IMPRESSION: AICD in stable position. Cardiomegaly with mild bilateral interstitial prominence suggesting interstitial edema. Pneumonitis cannot be excluded. Electronically Signed   By: Marcello Moores  Register   On: 05/22/2020 11:10   CUP PACEART REMOTE DEVICE CHECK  Result Date: 05/06/2020 Scheduled remote reviewed. Normal device function.  1 SVT logged 7 minutes 12 seconds w/ rates 160's bpm 1 ATR - same event as above 10 PMT successfully terminated by PMT algorithm Next remote 91 days.   Orson Eva, DO  [facility]  If 7PM-7AM, please contact night-coverage [email or web address] Password [email or web address] 05/25/2020, 7:48 AM   LOS: 3 days

## 2020-05-25 NOTE — Progress Notes (Signed)
Called by nurse do to Patients low saturation 70's. Patient talking with out difficulty breathing low sats. Placed on NRB mask. Saturation increased into 90's But dropped quickly upon removal. Patient given neb and incentive. Placed on  8 liter hfnc Saturations increased back to 90. Suspect this is fluid problem ??? Patient can easily do 2500 on incentive. Breath sounds appear decreased with fine crackles.

## 2020-05-25 NOTE — Progress Notes (Signed)
Came into patient's room to give breathing treatment and patient's [place] was on 6L with O2 sat of 87%.  Put patient back up to 8L to get him back to 90%.

## 2020-05-25 NOTE — Plan of Care (Signed)
  Problem: Acute Rehab PT Goals(only PT should resolve) Goal: Pt Will Go Supine/Side To Sit Outcome: Progressing Flowsheets (Taken 05/25/2020 1400) Pt will go Supine/Side to Sit: with min guard assist Goal: Pt Will Go Sit To Supine/Side Outcome: Progressing Flowsheets (Taken 05/25/2020 1400) Pt will go Sit to Supine/Side: with min guard assist Goal: Patient Will Transfer Sit To/From Stand Outcome: Progressing Flowsheets (Taken 05/25/2020 1400) Patient will transfer sit to/from stand: with min guard assist Goal: Pt Will Transfer Bed To Chair/Chair To Bed Outcome: Progressing Flowsheets (Taken 05/25/2020 1400) Pt will Transfer Bed to Chair/Chair to Bed: min guard assist Goal: Pt Will Ambulate Outcome: Progressing Flowsheets (Taken 05/25/2020 1400) Pt will Ambulate:  75 feet  with least restrictive assistive device  with min guard assist    Pamala Hurry D. Hartnett-Rands, MS, PT Per [place] [phone] 05/25/2020

## 2020-05-25 NOTE — Progress Notes (Signed)
Patient SpO2 96% on 8L HFNC and NRB. Patient placed on 6L HFNC, ABG to be drawn in 20 minutes.

## 2020-05-26 ENCOUNTER — Encounter (HOSPITAL_COMMUNITY): Payer: Self-pay

## 2020-05-26 DIAGNOSIS — J9601 Acute respiratory failure with hypoxia: Secondary | ICD-10-CM | POA: Diagnosis not present

## 2020-05-26 DIAGNOSIS — I5022 Chronic systolic (congestive) heart failure: Secondary | ICD-10-CM | POA: Diagnosis not present

## 2020-05-26 DIAGNOSIS — Z950 Presence of cardiac pacemaker: Secondary | ICD-10-CM | POA: Diagnosis not present

## 2020-05-26 DIAGNOSIS — N1832 Chronic kidney disease, stage 3b: Secondary | ICD-10-CM | POA: Diagnosis not present

## 2020-05-26 LAB — BASIC METABOLIC PANEL
Anion gap: 13 (ref 5–15)
BUN: 32 mg/dL — ABNORMAL HIGH (ref 8–23)
CO2: 24 mmol/L (ref 22–32)
Calcium: 8.4 mg/dL — ABNORMAL LOW (ref 8.9–10.3)
Chloride: 94 mmol/L — ABNORMAL LOW (ref 98–111)
Creatinine, Ser: 1.55 mg/dL — ABNORMAL HIGH (ref 0.61–1.24)
GFR, Estimated: 42 mL/min — ABNORMAL LOW (ref >60)
Glucose, Bld: 226 mg/dL — ABNORMAL HIGH (ref 70–99)
Potassium: 3.4 mmol/L — ABNORMAL LOW (ref 3.5–5.1)
Sodium: 131 mmol/L — ABNORMAL LOW (ref 135–145)

## 2020-05-26 LAB — MAGNESIUM: Magnesium: 1.9 mg/dL (ref 1.7–2.4)

## 2020-05-26 MED ORDER — POLYETHYLENE GLYCOL 3350 17 G PO PACK
17.0000 g | PACK | Freq: Every day | ORAL | Status: DC
  Administered 2020-05-27 – 2020-05-28 (×2): 17 g via ORAL
  Filled 2020-05-26 (×5): qty 1

## 2020-05-26 MED ORDER — SENNA 8.6 MG PO TABS
2.0000 | ORAL_TABLET | Freq: Every day | ORAL | Status: DC
  Administered 2020-05-27 – 2020-05-29 (×3): 17.2 mg via ORAL
  Filled 2020-05-26 (×5): qty 2

## 2020-05-26 MED ORDER — IPRATROPIUM-ALBUTEROL 0.5-2.5 (3) MG/3ML IN SOLN
3.0000 mL | Freq: Two times a day (BID) | RESPIRATORY_TRACT | Status: DC
  Administered 2020-05-26 – 2020-06-03 (×16): 3 mL via RESPIRATORY_TRACT
  Filled 2020-05-26 (×16): qty 3

## 2020-05-26 NOTE — Progress Notes (Addendum)
Eating with nasal  [place]. Increased O2 to 10 liters  to get sats to 91.  Daughter at bedside.  Patient talking.and eating

## 2020-05-26 NOTE — Progress Notes (Signed)
Patient noted to be mouth breathing, oxygen not in nose. Patient coherent. Random oxygen checked performed. Patient sats 59%. O2 replaced and nonrebreather applied. Sats up to 95% at this time.

## 2020-05-26 NOTE — Progress Notes (Signed)
Patient continues on 15L HFNC oxygen saturation sustaining in low to mid 90's no s/s of distress noted.

## 2020-05-26 NOTE — Progress Notes (Signed)
Attempted to titrate pt off nonrebreather. Patient sats dropped. To low 80s. Increased HFNC to 15 sats improved

## 2020-05-26 NOTE — Progress Notes (Signed)
PROGRESS NOTE  LC JOYNT WYO:[id number] DOB: [birth year] DOA: 05/22/2020 PCP: Asencion Noble, MD  Brief History: 86 y.o.malewith medical history ofsystolic CHF status postBiV PPM, CKD stage III, carotid stenosis, hyperlipidemia,PAF on apixabanand hypertension presentingapproximately 10-day history of generalized weakness, dyspnea on exertion, fevers and chills, and shortness of breath. Apparently, the patient stated that he saw his primary care provider approximately 2 to 3 weeks prior to this admission. He was placed on a prednisone taper. There is no improvement of his symptoms. He took some over-the-counter NyQuil with no improvement. He called his PCP again, the patient was placed on empiric dose of Tamiflu. He continued to have malaise and shortness of breath. As result, he presented for further evaluation. He denied any nausea, vomiting, diarrhea, chest pain, hemoptysis, abdominal pain, dysuria, hematuria, hematochezia, melena. The patient has not started any other new medications except for Robaxin and prednisone. He denies any worsening lower extremity edema, orthopnea, chest pain, headache, sore throat.He has one cat, no exotic pets. No travels. In the emergency department, the patient was febrile up to 100.74F. He was hemodynamically stable with oxygen saturation down to 80% on room air. He was placed on 4 L nasal cannula with saturation 93-94%. CTA chest was negative for PE but showed bilateral widespread groundglass opacities there was cardiomegaly with enlarged central pulmonary arteries. There is a chronic T12 endplate fracture which was stable compared to 2017.  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  Atypical pneumonia/lobar pneumonia -Urine Legionella antigen--neg -Urine Streptococcus pneumonia antigen--neg -Viral respiratory panel--neg -Continue azithromycin and ceftriaxone -Lactic acid 1.3  FUO -due to amiodarone toxicity--last dose 11/29 -now afebrile -UA neg for pyuria -CT chest as discussed above -blood cultures remain neg -CT abd/pelvis--sm bilateral pleural eff; unchanged GGO; mild fat stranding around sigmoid colon; ur bladder wall thickening  Paroxysmal atrial fibrillation -CHADSVASc = 5 -continue apixaban -currently in sinus -Discontinue amiodarone -continue coreg  CKD stage IIIb -Baseline creatinine 1.5-1.7 -A.m. BMP  Chronic systolic and diastolic CHF -Clinically euvolemic -05/21/2015 echo EF 20 to 25%, grade 1 DD -02/07/2020 echo EF 50-55%, no WMA, grade 1 DD, mild AR -Clinically compensated presently -Daily weights--standing weight on 11/29--179 lb  Left carotid stenosis -Continue apixaban  Hyperlipidemia -Continue statin  BPH -Continue tamsulosin        Status is: Inpatient  Remains inpatient appropriate because:IV treatments appropriate due to intensity of illness or inability to take PO   Dispo: The patient is from:Home Anticipated d/c is DX:AJOI Anticipated d/c date is: 2 days Patient currently is not medically stable to d/c.        Family Communication:Daughter updatedat bedside 11/30  Consultants:none  Code Status: FULL  DVT Prophylaxis:apixaban   Procedures: As Listed in Progress Note Above  Antibiotics: Ceftriaxone  11/27>> azithro11/26>>    Total time spent 35 minutes.  Greater than 50% spent face to face counseling and coordinating care.     Subjective: Patient complains of sob dyspnea on exertion.  Denies f/c, cp, n/v/d, abd pain  Objective: Vitals:   05/25/20 2100 05/26/20 0423 05/26/20 0759 05/26/20  1000  BP: 128/63   (!) 143/56  Pulse: 77   90  Resp: 18   16  Temp: 98.4 F (36.9 C)   98.1 F (36.7 C)  TempSrc: Oral   Oral  SpO2: 91% 92% 91% (!) 87%  Weight: 80.5 kg     Height:        Intake/Output Summary (Last 24 hours) at 05/26/2020 1406 Last data filed at 05/26/2020 0900 Gross per 24 hour  Intake 240 ml  Output 1950 ml  Net -1710 ml   Weight change: -3.6 kg Exam:   General:  Pt is alert, follows commands appropriately, not in acute distress  HEENT: No icterus, No thrush, No neck mass, [place]/AT  Cardiovascular: RRR, S1/S2, no rubs, no gallops  Respiratory: bilateral crackles. No wheeze  Abdomen: Soft/+BS, non tender, non distended, no guarding  Extremities: No edema, No lymphangitis, No petechiae, No rashes, no synovitis   Data Reviewed: I have personally reviewed following labs and imaging studies Basic Metabolic Panel: Recent Labs  Lab 05/22/20 1052 05/23/20 0729 05/24/20 0653 05/25/20 0706 05/26/20 1050  NA 134* 135 131* 130* 131*  K 3.9 4.0 3.9 3.9 3.4*  CL 98 101 97* 95* 94*  CO2 $Re'27 26 26 24 24  'Tfa$ GLUCOSE 101* 97 95 103* 226*  BUN 21 22 25* 19 32*  CREATININE 1.72* 1.46* 1.47* 1.36* 1.55*  CALCIUM 8.5* 8.1* 8.1* 8.2* 8.4*  MG  --   --  1.8  --  1.9   Liver Function Tests: Recent Labs  Lab 05/22/20 1052 05/23/20 0729 05/24/20 0942 05/25/20 0706  AST $Re'22 20 25 24  'asT$ ALT $R'18 17 19 19  'Kv$ ALKPHOS 70 55 58 62  BILITOT 0.5 0.5 0.5 0.7  PROT 6.8 5.4* 5.9* 6.0*  ALBUMIN 2.7* 2.1* 2.3* 2.1*   No results for input(s): LIPASE, AMYLASE in the last 168 hours. No results for input(s): AMMONIA in the last 168 hours. Coagulation Profile: No results for  input(s): INR, PROTIME in the last 168 hours. CBC: Recent Labs  Lab 05/22/20 1052 05/23/20 0729 05/23/20 1137 05/24/20 0653 05/25/20 0706  WBC 9.9 9.3 9.3 12.0* 12.0*  NEUTROABS 7.7  --   --  9.7* 9.4*  HGB 11.3* 9.6* 10.1* 10.2* 10.2*  HCT 34.4* 29.0* 30.7* 31.6* 30.6*  MCV 98.6 99.0 99.7 100.0 97.1  PLT 343 327 328 402* 480*   Cardiac Enzymes: No results for input(s): CKTOTAL, CKMB, CKMBINDEX, TROPONINI in the last 168 hours. BNP: Invalid input(s): POCBNP CBG: No results for input(s): GLUCAP in the last 168 hours. HbA1C: No results for input(s): HGBA1C in the last 72 hours. Urine analysis:    Component Value Date/Time   COLORURINE YELLOW 05/22/2020 [place] 05/22/2020 1041   LABSPEC 1.015 05/22/2020 1041   PHURINE 6.0 05/22/2020 1041   GLUCOSEU NEGATIVE 05/22/2020 1041   GLUCOSEU neg 02/26/2009 0000   HGBUR NEGATIVE 05/22/2020 [place] 05/22/2020 [place] 05/22/2020 1041   PROTEINUR 30 (A) 05/22/2020 1041   NITRITE NEGATIVE 05/22/2020 1041   LEUKOCYTESUR NEGATIVE 05/22/2020 1041   Sepsis Labs: $RemoveBefo'@LABRCNTIP'NCcBRuQTpRs$ (procalcitonin:4,lacticidven:4) ) Recent Results (from the past 240 hour(s))  Resp Panel by RT-PCR (Flu A&B, Covid) Nasopharyngeal Swab     Status: None   Collection Time: 05/22/20  9:57 AM   Specimen: Nasopharyngeal Swab; Nasopharyngeal(NP) swabs in vial transport medium  Result Value Ref Range Status   SARS Coronavirus 2 by RT PCR NEGATIVE NEGATIVE Final    Comment: (  NOTE) SARS-CoV-2 target nucleic acids are NOT DETECTED.  The SARS-CoV-2 RNA is generally detectable in upper respiratory specimens during the acute phase of infection. The lowest concentration of SARS-CoV-2 viral copies this assay can detect is 138 copies/mL. A negative result does not preclude SARS-Cov-2 infection and should not be used as the sole basis for treatment or other patient management decisions. A negative result may occur with   improper specimen collection/handling, submission of specimen other than nasopharyngeal swab, presence of viral mutation(s) within the areas targeted by this assay, and inadequate number of viral copies(<138 copies/mL). A negative result must be combined with clinical observations, patient history, and epidemiological information. The expected result is Negative.  Fact Sheet for Patients:  EntrepreneurPulse.com.au  Fact Sheet for Healthcare Providers:  IncredibleEmployment.be  This test is no t yet approved or cleared by the [place] FDA and  has been authorized for detection and/or diagnosis of SARS-CoV-2 by FDA under an Emergency Use Authorization (EUA). This EUA will remain  in effect (meaning this test can be used) for the duration of the COVID-19 declaration under Section 564(b)(1) of the Act, 21 U.S.C.section 360bbb-3(b)(1), unless the authorization is terminated  or revoked sooner.       Influenza A by PCR NEGATIVE NEGATIVE Final   Influenza B by PCR NEGATIVE NEGATIVE Final    Comment: (NOTE) The Xpert Xpress SARS-CoV-2/FLU/RSV plus assay is intended as an aid in the diagnosis of influenza from Nasopharyngeal swab specimens and should not be used as a sole basis for treatment. Nasal washings and aspirates are unacceptable for Xpert Xpress SARS-CoV-2/FLU/RSV testing.  Fact Sheet for Patients: EntrepreneurPulse.com.au  Fact Sheet for Healthcare Providers: IncredibleEmployment.be  This test is not yet approved or cleared by the [place] FDA and has been authorized for detection and/or diagnosis of SARS-CoV-2 by FDA under an Emergency Use Authorization (EUA). This EUA will remain in effect (meaning this test can be used) for the duration of the COVID-19 declaration under Section 564(b)(1) of the Act, 21 U.S.C. section 360bbb-3(b)(1), unless the authorization is terminated  or revoked.  Performed at [facility], [address], [place], [place] [address]   Blood culture (routine x 2)     Status: None (Preliminary result)   Collection Time: 05/22/20 10:45 AM   Specimen: BLOOD LEFT HAND  Result Value Ref Range Status   Specimen Description   Final    BLOOD LEFT HAND BOTTLES DRAWN AEROBIC AND ANAEROBIC   Special Requests   Final    Blood Culture results may not be optimal due to an inadequate volume of blood received in culture bottles   Culture   Final    NO GROWTH 4 DAYS Performed at [facility], [address] [place], [place] [address]    Report Status PENDING  Incomplete  Blood culture (routine x 2)     Status: None (Preliminary result)   Collection Time: 05/22/20 10:52 AM   Specimen: Right Antecubital; Blood  Result Value Ref Range Status   Specimen Description   Final    RIGHT ANTECUBITAL BOTTLES DRAWN AEROBIC AND ANAEROBIC   Special Requests Blood Culture adequate volume  Final   Culture   Final    NO GROWTH 4 DAYS Performed at [facility], [address] [place], [place] [address]    Report Status PENDING  Incomplete  Resp Panel by RT-PCR (Flu A&B, Covid)     Status: None   Collection Time: 05/23/20  6:40 PM  Result Value Ref Range Status  SARS Coronavirus 2 by RT PCR NEGATIVE NEGATIVE Final    Comment: (NOTE) SARS-CoV-2 target nucleic acids are NOT DETECTED.  The SARS-CoV-2 RNA is generally detectable in upper respiratory specimens during the acute phase of infection. The lowest concentration of SARS-CoV-2 viral copies this assay can detect is 138 copies/mL. A negative result does not preclude SARS-Cov-2 infection and should not be used as the sole basis for treatment or other patient management decisions. A negative result may occur with  improper specimen collection/handling, submission of specimen other than nasopharyngeal swab, presence of viral mutation(s) within the areas targeted by this assay, and inadequate number of  viral copies(<138 copies/mL). A negative result must be combined with clinical observations, patient history, and epidemiological information. The expected result is Negative.  Fact Sheet for Patients:  EntrepreneurPulse.com.au  Fact Sheet for Healthcare Providers:  IncredibleEmployment.be  This test is no t yet approved or cleared by the [place] FDA and  has been authorized for detection and/or diagnosis of SARS-CoV-2 by FDA under an Emergency Use Authorization (EUA). This EUA will remain  in effect (meaning this test can be used) for the duration of the COVID-19 declaration under Section 564(b)(1) of the Act, 21 U.S.C.section 360bbb-3(b)(1), unless the authorization is terminated  or revoked sooner.       Influenza A by PCR NEGATIVE NEGATIVE Final   Influenza B by PCR NEGATIVE NEGATIVE Final    Comment: (NOTE) The Xpert Xpress SARS-CoV-2/FLU/RSV plus assay is intended as an aid in the diagnosis of influenza from Nasopharyngeal swab specimens and should not be used as a sole basis for treatment. Nasal washings and aspirates are unacceptable for Xpert Xpress SARS-CoV-2/FLU/RSV testing.  Fact Sheet for Patients: EntrepreneurPulse.com.au  Fact Sheet for Healthcare Providers: IncredibleEmployment.be  This test is not yet approved or cleared by the [place] FDA and has been authorized for detection and/or diagnosis of SARS-CoV-2 by FDA under an Emergency Use Authorization (EUA). This EUA will remain in effect (meaning this test can be used) for the duration of the COVID-19 declaration under Section 564(b)(1) of the Act, 21 U.S.C. section 360bbb-3(b)(1), unless the authorization is terminated or revoked.  Performed at [facility], [address] [place], [place] [address]   Respiratory Panel by PCR     Status: None   Collection Time: 05/24/20 10:04 AM   Specimen: Nasopharyngeal Swab;  Respiratory  Result Value Ref Range Status   Adenovirus NOT DETECTED NOT DETECTED Final   Coronavirus 229E NOT DETECTED NOT DETECTED Final    Comment: (NOTE) The Coronavirus on the Respiratory Panel, DOES NOT test for the novel  Coronavirus (2019 nCoV)    Coronavirus HKU1 NOT DETECTED NOT DETECTED Final   Coronavirus NL63 NOT DETECTED NOT DETECTED Final   Coronavirus OC43 NOT DETECTED NOT DETECTED Final   Metapneumovirus NOT DETECTED NOT DETECTED Final   Rhinovirus / Enterovirus NOT DETECTED NOT DETECTED Final   Influenza A NOT DETECTED NOT DETECTED Final   Influenza B NOT DETECTED NOT DETECTED Final   Parainfluenza Virus 1 NOT DETECTED NOT DETECTED Final   Parainfluenza Virus 2 NOT DETECTED NOT DETECTED Final   Parainfluenza Virus 3 NOT DETECTED NOT DETECTED Final   Parainfluenza Virus 4 NOT DETECTED NOT DETECTED Final   Respiratory Syncytial Virus NOT DETECTED NOT DETECTED Final   Bordetella pertussis NOT DETECTED NOT DETECTED Final   Chlamydophila pneumoniae NOT DETECTED NOT DETECTED Final   Mycoplasma pneumoniae NOT DETECTED NOT DETECTED Final    Comment: Performed at [facility], [address] [place], [place] [address]     Scheduled Meds: . apixaban  2.5 mg Oral BID  . carvedilol  3.125 mg Oral BID WC  . ferrous sulfate  325 mg Oral Q breakfast  . guaiFENesin  600 mg Oral BID  . ipratropium-albuterol  3 mL Nebulization BID  . methocarbamol  500 mg Oral TID  . methylPREDNISolone (SOLU-MEDROL) injection  80 mg Intravenous Q12H  . pravastatin  20 mg Oral QPM  . sodium chloride flush  3 mL Intravenous Q12H  . sodium chloride flush  3 mL Intravenous Q12H  . tamsulosin  0.4 mg Oral Daily   Continuous Infusions: . sodium chloride    . azithromycin 500 mg (05/25/20 1550)  . cefTRIAXone (ROCEPHIN)  IV 1 g (05/26/20 1010)    Procedures/Studies: CT ABDOMEN PELVIS WO CONTRAST  Result Date: 05/24/2020 CLINICAL DATA:  Generalized weakness and fever of unknown origin.  EXAM: CT ABDOMEN AND PELVIS WITHOUT CONTRAST TECHNIQUE: Multidetector CT imaging of the abdomen and pelvis was performed following the standard protocol without IV contrast. COMPARISON:  Chest CT dated 05/22/2020 and renal ultrasound dated 06/02/2009. FINDINGS: Lower chest: Cardiomegaly is partially imaged. There are small bilateral pleural effusions which appear similar to prior exam. Diffuse bilateral ground-glass opacities appear unchanged from prior exam. Hepatobiliary: No focal liver abnormality is seen. No gallstones, gallbladder wall thickening, or biliary dilatation. Pancreas: Unremarkable. No pancreatic ductal dilatation or surrounding inflammatory changes. Spleen: Normal in size without focal abnormality. Adrenals/Urinary Tract: Adrenal glands are unremarkable. Bilateral renal cysts are noted, measuring up to 2.1 cm on the right and 1.8 cm on the left. Kidneys are normal, without renal calculi, focal lesion, or hydronephrosis. Multiple urinary bladder diverticula are noted and there is associated urinary bladder wall thickening, likely reflecting bladder outlet obstruction given the patient's large prostate. Stomach/Bowel: Stomach is within normal limits. Colonic diverticula are noted, particularly in the sigmoid colon. There is a mild fat stranding surrounding the proximal sigmoid colon (series 5 images 42-49) which may reflect acute diverticulitis. No evidence of abscess or fistula formation. No pericecal inflammatory changes are noted to suggest acute appendicitis. No evidence of bowel obstruction. Vascular/Lymphatic: Aortic atherosclerosis. No enlarged abdominal or pelvic lymph nodes. Reproductive: The prostate is enlarged, measuring 6.5 cm in transverse dimension, and indents the bladder. Other: No abdominal wall hernia or abnormality. No abdominopelvic ascites. Musculoskeletal: Chronic appearing wedge deformities are seen at T12 and L4 with less than 25% height loss. Insert degenerative IMPRESSION:  1. Mild fat stranding surrounding the proximal sigmoid colon may reflect acute diverticulitis. No evidence of complication. 2. Multiple urinary bladder diverticula with associated urinary bladder wall thickening, likely reflecting bladder outlet obstruction given the patient's large prostate. 3. Small bilateral pleural effusions with diffuse bilateral ground-glass opacities appear unchanged from prior exam. Aortic Atherosclerosis (ICD10-I70.0). Electronically Signed   By: Zerita Boers M.D.   On: 05/24/2020 16:46   CT Angio Chest PE W and/or Wo Contrast  Result Date: 05/22/2020 CLINICAL DATA:  86 year old male with chest pain. Weakness. Shortness of breath. Abnormal D-dimer. EXAM: CT ANGIOGRAPHY CHEST WITH CONTRAST TECHNIQUE: Multidetector CT imaging of the chest was performed using the standard protocol during bolus administration of intravenous contrast. Multiplanar CT image reconstructions and MIPs were obtained to evaluate the vascular anatomy. CONTRAST:  57mL OMNIPAQUE IOHEXOL 350 MG/ML SOLN COMPARISON:  Portable chest earlier today. Chest radiographs 07/09/2015. Neck CT, CTA 07/03/2017 and earlier. FINDINGS: Cardiovascular: Good contrast bolus timing in the pulmonary arterial tree. No focal  filling defect identified in the pulmonary arteries to suggest acute pulmonary embolism. Mild to moderate enlargement of central pulmonary arteries relative to the aorta (series 3, image 147). Calcified aortic atherosclerosis. Calcified coronary artery atherosclerosis. Cardiomegaly. Left chest cardiac pacemaker type device. No pericardial effusion. Mediastinum/Nodes: Negative.  No lymphadenopathy. Lungs/Pleura: Major airways are patent. Widespread although fairly symmetric peripheral, peribronchial, and also in the lung apices this is new or progressed since 2019. No consolidation. Trace layering pleural fluid greater on the left. And dependent pulmonary ground-glass opacity in both lungs Upper Abdomen: Negative for  age visible upper abdominal viscera Musculoskeletal: Chronic T12 superior endplate compression suspected to be stable since 2017 radiographs. No acute osseous abnormality identified. Review of the MIP images confirms the above findings. IMPRESSION: 1. Negative for pulmonary embolus. 2. Widespread abnormal although nonspecific pulmonary ground-glass opacity with trace layering pleural fluid. Acute viral/atypical respiratory infection not excluded. Chronic interstitial lung disease is also a consideration. 3. Cardiomegaly with mild to moderate enlargement of the central pulmonary arteries raising the possibility of pulmonary artery hypertension. Aortic Atherosclerosis (ICD10-I70.0). Electronically Signed   By: Genevie Ann M.D.   On: 05/22/2020 14:38   DG CHEST PORT 1 VIEW  Result Date: 05/25/2020 CLINICAL DATA:  Shortness of breath EXAM: PORTABLE CHEST 1 VIEW COMPARISON:  05/22/2020 FINDINGS: Likely chronic interstitial lung changes. Patchy superimposed increased density. No significant pleural effusion. No pneumothorax. Stable cardiomediastinal contours. Left chest wall pacemaker. IMPRESSION: Patchy increased density superimposed on likely chronic interstitial lung disease. This may reflect superimposed pneumonia or edema. Electronically Signed   By: Macy Mis M.D.   On: 05/25/2020 08:27   DG Chest Port 1 View  Result Date: 05/22/2020 CLINICAL DATA:  Weakness and shortness of breath. EXAM: PORTABLE CHEST 1 VIEW COMPARISON:  Chest x-ray 07/09/2015. FINDINGS: AICD in stable position. Cardiomegaly with mild bilateral interstitial prominence suggesting interstitial edema. Pneumonitis cannot be excluded. No pleural effusion or pneumothorax. IMPRESSION: AICD in stable position. Cardiomegaly with mild bilateral interstitial prominence suggesting interstitial edema. Pneumonitis cannot be excluded. Electronically Signed   By: Marcello Moores  Register   On: 05/22/2020 11:10   CUP PACEART REMOTE DEVICE CHECK  Result  Date: 05/06/2020 Scheduled remote reviewed. Normal device function.  1 SVT logged 7 minutes 12 seconds w/ rates 160's bpm 1 ATR - same event as above 10 PMT successfully terminated by PMT algorithm Next remote 91 days.   Orson Eva, DO  [facility]  If 7PM-7AM, please contact night-coverage [email or web address] Password [email or web address] 05/26/2020, 2:06 PM   LOS: 4 days

## 2020-05-26 NOTE — Progress Notes (Signed)
Family helped patient to shower earlier in afternoon,  unbeknownst to staff, patient's O2 sats dropped and became very SOB.  Assisted back to bed and put on nonrebreather.  Resting comfortably now and O2 sats staying around 93

## 2020-05-26 NOTE — Progress Notes (Signed)
PT Cancellation Note  Patient Details Name: Max Cole MRN: [id number] DOB: 1927/09/29   Cancelled Treatment:    Reason Eval/Treat Not Completed: Other (comment);Fatigue/lethargy limiting ability to participate (pt just finished bath). Upon arrival, pt just finished bath with daughter and nurse tech and very fatigued. Will check back as schedule permits.   Talbot Grumbling PT, DPT 05/26/20, 2:58 PM [phone]

## 2020-05-27 DIAGNOSIS — I5022 Chronic systolic (congestive) heart failure: Secondary | ICD-10-CM

## 2020-05-27 DIAGNOSIS — I482 Chronic atrial fibrillation, unspecified: Secondary | ICD-10-CM | POA: Diagnosis not present

## 2020-05-27 LAB — CBC WITH DIFFERENTIAL/PLATELET
Abs Immature Granulocytes: 0.22 10*3/uL — ABNORMAL HIGH (ref 0.00–0.07)
Basophils Absolute: 0 10*3/uL (ref 0.0–0.1)
Basophils Relative: 0 %
Eosinophils Absolute: 0 10*3/uL (ref 0.0–0.5)
Eosinophils Relative: 0 %
HCT: 28.5 % — ABNORMAL LOW (ref 39.0–52.0)
Hemoglobin: 9.6 g/dL — ABNORMAL LOW (ref 13.0–17.0)
Immature Granulocytes: 1 %
Lymphocytes Relative: 2 %
Lymphs Abs: 0.5 10*3/uL — ABNORMAL LOW (ref 0.7–4.0)
MCH: 32.3 pg (ref 26.0–34.0)
MCHC: 33.7 g/dL (ref 30.0–36.0)
MCV: 96 fL (ref 80.0–100.0)
Monocytes Absolute: 1 10*3/uL (ref 0.1–1.0)
Monocytes Relative: 5 %
Neutro Abs: 18.2 10*3/uL — ABNORMAL HIGH (ref 1.7–7.7)
Neutrophils Relative %: 92 %
Platelets: 572 10*3/uL — ABNORMAL HIGH (ref 150–400)
RBC: 2.97 MIL/uL — ABNORMAL LOW (ref 4.22–5.81)
RDW: 12.4 % (ref 11.5–15.5)
WBC: 19.9 10*3/uL — ABNORMAL HIGH (ref 4.0–10.5)
nRBC: 0 % (ref 0.0–0.2)

## 2020-05-27 LAB — BASIC METABOLIC PANEL
Anion gap: 9 (ref 5–15)
BUN: 37 mg/dL — ABNORMAL HIGH (ref 8–23)
CO2: 26 mmol/L (ref 22–32)
Calcium: 8.1 mg/dL — ABNORMAL LOW (ref 8.9–10.3)
Chloride: 97 mmol/L — ABNORMAL LOW (ref 98–111)
Creatinine, Ser: 1.37 mg/dL — ABNORMAL HIGH (ref 0.61–1.24)
GFR, Estimated: 49 mL/min — ABNORMAL LOW (ref >60)
Glucose, Bld: 129 mg/dL — ABNORMAL HIGH (ref 70–99)
Potassium: 3.5 mmol/L (ref 3.5–5.1)
Sodium: 132 mmol/L — ABNORMAL LOW (ref 135–145)

## 2020-05-27 NOTE — Progress Notes (Signed)
Palliative-   Thank you for this consult- chart has been reviewed- called daughter- Lovell Sheehan to arrange time to for [place] discussion. She is going to speak with her siblings and call back.   Mariana Kaufman, AGNP-C Palliative Medicine  Please call Palliative Medicine team phone with any questions [phone]. For individual providers please see AMION.  No charge note

## 2020-05-27 NOTE — Progress Notes (Signed)
PT Cancellation Note  Patient Details Name: Max Cole MRN: [id number] DOB: [birth year]   Cancelled Treatment:    Reason Eval/Treat Not Completed: Patient declined, no reason specified  Attempted PT session.  Pt currently sitting up in bed eating lunch.  Pt has completed transfer to [facility] with nursing prior arrival.  Pt declined therapy due to fatigue.  Pt left in bed with call bell within reach.  Max Cole, LPTA/CLT; CBIS [phone]  Max Cole 05/27/2020, 2:07 PM

## 2020-05-27 NOTE — Progress Notes (Signed)
NAME:  Max Cole, MRN:  [id number], DOB:  Nov 05, 1927, LOS: 5 ADMISSION DATE:  05/22/2020, CONSULTATION DATE:  05/25/20  REFERRING MD:  Tat/ Triad, CHIEF COMPLAINT:  Sob/ resp failure with GG changes   Brief History   89 yowm remote smoker fully vaccinated maint on amiodarone since aug 2021  with several weeks to maybe a month of increasing fatgiue/sob then slt feverish  1 week PTA with neg COVID testing but  with GG changes on Chest CT and deteriorating sats on high flow 02 so PCCM consulted am 11/29 with last dose of amiodarone 11/28.       Past Medical History  Systolic CHF status postBiV PPM, CKD stage III, carotid stenosis, hyperlipidemia,PAF on apixabanand hypertension  Significant Hospital Events   Last dose of amiodarone 11/28 Solumedrol started 11/29 am   Consults:  PCCM   Procedures:     Significant Diagnostic Tests:  ESR 11/29  = 18  (pre steroid rx)    Micro Data:  BC x 2 11/26  >>> Resp PCR panel 11/26  neg  Resp PCR panel 11/27 neg Urinary strep  11/27  Neg  Urine legionella 11/27  Neg  Resp PCR panel  11/128  neg     Antimicrobials:   Zmax 11/26 >>> Rocephin 11/27 >>>   Scheduled Meds: . apixaban  2.5 mg Oral BID  . carvedilol  3.125 mg Oral BID WC  . ferrous sulfate  325 mg Oral Q breakfast  . guaiFENesin  600 mg Oral BID  . ipratropium-albuterol  3 mL Nebulization BID  . methocarbamol  500 mg Oral TID  . methylPREDNISolone (SOLU-MEDROL) injection  80 mg Intravenous Q12H  . polyethylene glycol  17 g Oral Daily  . pravastatin  20 mg Oral QPM  . senna  2 tablet Oral Daily  . sodium chloride flush  3 mL Intravenous Q12H  . sodium chloride flush  3 mL Intravenous Q12H  . tamsulosin  0.4 mg Oral Daily   Continuous Infusions: . sodium chloride     PRN Meds:.sodium chloride, acetaminophen **OR** acetaminophen, albuterol, bisacodyl, ondansetron **OR** ondansetron (ZOFRAN) IV, polyethylene glycol, sodium chloride, sodium chloride flush,  traZODone  Interim history/subjective:  Has plateaued since last visit - able to get almost 2500 cc on IS but still very 02 dep  Objective   Blood pressure (!) 165/87, pulse 84, temperature 98.5 F (36.9 C), temperature source Oral, resp. rate 20, height $RemoveBe'6\' 1"'FpOliUInY$  (1.854 m), weight 83.5 kg, SpO2 91 %.        Intake/Output Summary (Last 24 hours) at 05/27/2020 1329 Last data filed at 05/27/2020 0558 Gross per 24 hour  Intake 480 ml  Output 850 ml  Net -370 ml   Filed Weights   05/25/20 0610 05/25/20 2100 05/27/20 0600  Weight: 84.1 kg 80.5 kg 83.5 kg    Examination: Tmax  99.6  sats 92% on 10 lpm Nasal High flow  Pt alert, approp nad @ 60 degrees hob  No jvd Oropharynx clear,  mucosa nl Neck supple Lungs with distant insp crackles bilaterally RRR no s3 or or sign murmur Abd soft with nl  excursion  Extr warm with no edema or clubbing noted Neuro  Sensorium intact  no apparent motor deficits        Resolved Hospital Problem list      Assessment & Plan:  1)  Subacute respiratory illness with low grade fever/ GG changes on CT and worsening gas exchange while on adequate coverage for  CAP and amiodarone for afib    Miscellaneous:Alv microlithiasis, alv proteinosis, asp, bronchiectais, BOOP   ARDS/ AIP Occupational dz/ HSP Neoplasm Infection Note very low PCT on admit/ covid PCR neg x 3 so doubt  Drug   Amiodarone leading suspect - d/c'd  11/28 last dose then Pulmonary emboli, Protein disorders Edema - bnp not that impressive but hard to exclude  > continue neg I/O as tol  Eosinophilic dz - note Eos 0.0 so less likely  Sarcoidosis Connective tissue dz > ANA neg, esr nl   Hist X / Hemorrhage - hgb down but no hemoptysis so less likely  Idiopathic      Labs   CBC: Recent Labs  Lab 05/22/20 1052 05/22/20 1052 05/23/20 0729 05/23/20 1137 05/24/20 0653 05/25/20 0706 05/27/20 0602  WBC 9.9   < > 9.3 9.3 12.0* 12.0* 19.9*  NEUTROABS 7.7  --   --   --  9.7* 9.4*  18.2*  HGB 11.3*   < > 9.6* 10.1* 10.2* 10.2* 9.6*  HCT 34.4*   < > 29.0* 30.7* 31.6* 30.6* 28.5*  MCV 98.6   < > 99.0 99.7 100.0 97.1 96.0  PLT 343   < > 327 328 402* 480* 572*   < > = values in this interval not displayed.    Basic Metabolic Panel: Recent Labs  Lab 05/23/20 0729 05/24/20 0653 05/25/20 0706 05/26/20 1050 05/27/20 0602  NA 135 131* 130* 131* 132*  K 4.0 3.9 3.9 3.4* 3.5  CL 101 97* 95* 94* 97*  CO2 $Re'26 26 24 24 26  'BDl$ GLUCOSE 97 95 103* 226* 129*  BUN 22 25* 19 32* 37*  CREATININE 1.46* 1.47* 1.36* 1.55* 1.37*  CALCIUM 8.1* 8.1* 8.2* 8.4* 8.1*  MG  --  1.8  --  1.9  --    GFR: Estimated Creatinine Clearance: 40.5 mL/min (A) (by C-G formula based on SCr of 1.37 mg/dL (H)). Recent Labs  Lab 05/22/20 1052 05/22/20 1052 05/23/20 0729 05/23/20 0729 05/23/20 1137 05/24/20 0653 05/25/20 0706 05/27/20 0602  PROCALCITON  --   --  0.13  --   --  0.23 0.43  --   WBC 9.9   < > 9.3   < > 9.3 12.0* 12.0* 19.9*  LATICACIDVEN 1.3  --   --   --   --   --   --   --    < > = values in this interval not displayed.    Liver Function Tests: Recent Labs  Lab 05/22/20 1052 05/23/20 0729 05/24/20 0942 05/25/20 0706  AST $Re'22 20 25 24  'WPA$ ALT $R'18 17 19 19  'Hx$ ALKPHOS 70 55 58 62  BILITOT 0.5 0.5 0.5 0.7  PROT 6.8 5.4* 5.9* 6.0*  ALBUMIN 2.7* 2.1* 2.3* 2.1*   No results for input(s): LIPASE, AMYLASE in the last 168 hours. No results for input(s): AMMONIA in the last 168 hours.  ABG    Component Value Date/Time   PHART 7.436 05/25/2020 0902   PCO2ART 37.9 05/25/2020 0902   PO2ART 68.2 (L) 05/25/2020 0902   HCO3 25.6 05/25/2020 0902   O2SAT 93.3 05/25/2020 0902     Coagulation Profile: No results for input(s): INR, PROTIME in the last 168 hours.  Cardiac Enzymes: No results for input(s): CKTOTAL, CKMB, CKMBINDEX, TROPONINI in the last 168 hours.  HbA1C: No results found for: HGBA1C  CBG: No results for input(s): GLUCAP in the last 168 hours.   Discussed with  Daughter Georgina Pillion  rec up in chair if tolerated / IS continue  No change steroids/ added ppi prophylactically   Christinia Gully, MD [facility]monary [facility] [place] [phone]   After 7:00 pm call Elink  [phone]

## 2020-05-27 NOTE — Progress Notes (Signed)
Palliative-   Plan made to meet with patient's family tomorrow at 71 for [place] discussion.   Mariana Kaufman, AGNP-C Palliative Medicine  Please call Palliative Medicine team phone with any questions [phone]. For individual providers please see AMION.  No charge

## 2020-05-27 NOTE — Progress Notes (Signed)
PROGRESS NOTE  Max Cole CBS:[id number] DOB: [birth year] DOA: 05/22/2020 PCP: Asencion Noble, MD  Brief History: 86 y.o.malewith medical history ofsystolic CHF status postBiV PPM, CKD stage III, carotid stenosis, hyperlipidemia,PAF on apixabanand hypertension presentingapproximately 10-day history of generalized weakness, dyspnea on exertion, fevers and chills, and shortness of breath. Apparently, the patient stated that he saw his primary care provider approximately 2 to 3 weeks prior to this admission. He was placed on a prednisone taper. There is no improvement of his symptoms. He took some over-the-counter NyQuil with no improvement. He called his PCP again, the patient was placed on empiric dose of Tamiflu. He continued to have malaise and shortness of breath. As result, he presented for further evaluation. He denied any nausea, vomiting, diarrhea, chest pain, hemoptysis, abdominal pain, dysuria, hematuria, hematochezia, melena. The patient has not started any other new medications except for Robaxin and prednisone. He denies any worsening lower extremity edema, orthopnea, chest pain, headache, sore throat.He has one cat, no exotic pets. No travels. In the emergency department, the patient was febrile up to 100.53F. He was hemodynamically stable with oxygen saturation down to 80% on room air. He was placed on 4 L nasal cannula with saturation 93-94%. CTA chest was negative for PE but showed bilateral widespread groundglass opacities there was cardiomegaly with enlarged central pulmonary arteries. There is a chronic T12 endplate fracture which was stable compared to 2017.  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  Atypical pneumonia/lobar pneumonia -Urine Legionella antigen--neg -Urine Streptococcus pneumonia antigen--neg -Viral respiratory panel--neg -Continue azithromycin and ceftriaxone -Lactic acid 1.3  FUO -due to amiodarone toxicity--last dose 11/29 -now afebrile -UA neg for pyuria -CT chest as discussed above -blood cultures remain neg -CT abd/pelvis--sm bilateral pleural eff; unchanged GGO; mild fat stranding around sigmoid colon; ur bladder wall thickening  Paroxysmal atrial fibrillation -CHADSVASc = 5 -continue apixaban -currently in sinus -Discontinued amiodarone due to pulmonary toxicity, add to allergies -continue coreg  CKD stage IIIb -Baseline creatinine 8.4-6.6  Chronic systolic and diastolic CHF -Clinically euvolemic -05/21/2015 echo EF 20 to 25%, grade 1 DD -02/07/2020 echo EF 50-55%, no WMA, grade 1 DD, mild AR -Clinically compensated presently -Daily weights  Left carotid stenosis -Continue apixaban  Hyperlipidemia -Continue statin  BPH -Continue tamsulosin   Status is: Inpatient  Remains inpatient appropriate because:IV treatments appropriate due to intensity of illness or inability to take PO  Dispo: The patient is from:Home Anticipated d/c is ZL:DJTT Anticipated d/c date is: 2 days Patient currently is not medically stable to d/c.   Family Communication:Daughter updatedat bedside 12/1  Consultants:none  Code Status: FULL  DVT Prophylaxis:apixaban   Procedures: As Listed in  Progress Note Above  Antibiotics: Ceftriaxone 11/27>> azithro11/26>>  Total time spent 35 minutes.  Greater than 50% spent face to face counseling and coordinating care.   Subjective: Patient reports slight improvement of SOB. No CP.   Objective: Vitals:   05/27/20 0447 05/27/20 0600 05/27/20 0713 05/27/20 1457  BP: Marland Kitchen)  165/87   140/66  Pulse: 84   80  Resp: 20   18  Temp: 98.5 F (36.9 C)   97.6 F (36.4 C)  TempSrc: Oral     SpO2: 93%  91% 90%  Weight:  83.5 kg    Height:        Intake/Output Summary (Last 24 hours) at 05/27/2020 1826 Last data filed at 05/27/2020 0558 Gross per 24 hour  Intake 240 ml  Output 850 ml  Net -610 ml   Weight change: 3 kg Exam:   General:  Awake, alert, NAD, cooperative.   HEENT: No icterus, No thrush, No neck mass, [place]/AT  Cardiovascular: normal S1/S2, no rubs, no gallops  Respiratory: BBS with crackles heard.   Abdomen: Soft/+BS, non tender, non distended, no guarding  Extremities: No edema, No lymphangitis, No petechiae, No rashes, no synovitis   Data Reviewed: I have personally reviewed following labs and imaging studies Basic Metabolic Panel: Recent Labs  Lab 05/23/20 0729 05/24/20 0653 05/25/20 0706 05/26/20 1050 05/27/20 0602  NA 135 131* 130* 131* 132*  K 4.0 3.9 3.9 3.4* 3.5  CL 101 97* 95* 94* 97*  CO2 $Re'26 26 24 24 26  'vod$ GLUCOSE 97 95 103* 226* 129*  BUN 22 25* 19 32* 37*  CREATININE 1.46* 1.47* 1.36* 1.55* 1.37*  CALCIUM 8.1* 8.1* 8.2* 8.4* 8.1*  MG  --  1.8  --  1.9  --    Liver Function Tests: Recent Labs  Lab 05/22/20 1052 05/23/20 0729 05/24/20 0942 05/25/20 0706  AST $Re'22 20 25 24  'mdN$ ALT $R'18 17 19 19  'lF$ ALKPHOS 70 55 58 62  BILITOT 0.5 0.5 0.5 0.7  PROT 6.8 5.4* 5.9* 6.0*  ALBUMIN 2.7* 2.1* 2.3* 2.1*   No results for input(s): LIPASE, AMYLASE in the last 168 hours. No results for input(s): AMMONIA in the last 168 hours. Coagulation Profile: No results for input(s): INR, PROTIME in the last 168  hours. CBC: Recent Labs  Lab 05/22/20 1052 05/22/20 1052 05/23/20 0729 05/23/20 1137 05/24/20 0653 05/25/20 0706 05/27/20 0602  WBC 9.9   < > 9.3 9.3 12.0* 12.0* 19.9*  NEUTROABS 7.7  --   --   --  9.7* 9.4* 18.2*  HGB 11.3*   < > 9.6* 10.1* 10.2* 10.2* 9.6*  HCT 34.4*   < > 29.0* 30.7* 31.6* 30.6* 28.5*  MCV 98.6   < > 99.0 99.7 100.0 97.1 96.0  PLT 343   < > 327 328 402* 480* 572*   < > = values in this interval not displayed.   Cardiac Enzymes: No results for input(s): CKTOTAL, CKMB, CKMBINDEX, TROPONINI in the last 168 hours. BNP: Invalid input(s): POCBNP CBG: No results for input(s): GLUCAP in the last 168 hours. HbA1C: No results for input(s): HGBA1C in the last 72 hours. Urine analysis:    Component Value Date/Time   COLORURINE YELLOW 05/22/2020 [place] 05/22/2020 1041   LABSPEC 1.015 05/22/2020 1041   PHURINE 6.0 05/22/2020 1041   GLUCOSEU NEGATIVE 05/22/2020 1041   GLUCOSEU neg 02/26/2009 0000   HGBUR NEGATIVE 05/22/2020 [place] 05/22/2020 [place] 05/22/2020 1041   PROTEINUR 30 (A) 05/22/2020 1041   NITRITE NEGATIVE 05/22/2020 1041   LEUKOCYTESUR NEGATIVE 05/22/2020 1041    Recent Results (from the past 240 hour(s))  Resp Panel by RT-PCR (Flu A&B, Covid) Nasopharyngeal Swab     Status: None   Collection Time: 05/22/20  9:57 AM  Specimen: Nasopharyngeal Swab; Nasopharyngeal(NP) swabs in vial transport medium  Result Value Ref Range Status   SARS Coronavirus 2 by RT PCR NEGATIVE NEGATIVE Final    Comment: (NOTE) SARS-CoV-2 target nucleic acids are NOT DETECTED.  The SARS-CoV-2 RNA is generally detectable in upper respiratory specimens during the acute phase of infection. The lowest concentration of SARS-CoV-2 viral copies this assay can detect is 138 copies/mL. A negative result does not preclude SARS-Cov-2 infection and should not be used as the sole basis for treatment or other patient  management decisions. A negative result may occur with  improper specimen collection/handling, submission of specimen other than nasopharyngeal swab, presence of viral mutation(s) within the areas targeted by this assay, and inadequate number of viral copies(<138 copies/mL). A negative result must be combined with clinical observations, patient history, and epidemiological information. The expected result is Negative.  Fact Sheet for Patients:  EntrepreneurPulse.com.au  Fact Sheet for Healthcare Providers:  IncredibleEmployment.be  This test is no t yet approved or cleared by the [place] FDA and  has been authorized for detection and/or diagnosis of SARS-CoV-2 by FDA under an Emergency Use Authorization (EUA). This EUA will remain  in effect (meaning this test can be used) for the duration of the COVID-19 declaration under Section 564(b)(1) of the Act, 21 U.S.C.section 360bbb-3(b)(1), unless the authorization is terminated  or revoked sooner.       Influenza A by PCR NEGATIVE NEGATIVE Final   Influenza B by PCR NEGATIVE NEGATIVE Final    Comment: (NOTE) The Xpert Xpress SARS-CoV-2/FLU/RSV plus assay is intended as an aid in the diagnosis of influenza from Nasopharyngeal swab specimens and should not be used as a sole basis for treatment. Nasal washings and aspirates are unacceptable for Xpert Xpress SARS-CoV-2/FLU/RSV testing.  Fact Sheet for Patients: EntrepreneurPulse.com.au  Fact Sheet for Healthcare Providers: IncredibleEmployment.be  This test is not yet approved or cleared by the [place] FDA and has been authorized for detection and/or diagnosis of SARS-CoV-2 by FDA under an Emergency Use Authorization (EUA). This EUA will remain in effect (meaning this test can be used) for the duration of the COVID-19 declaration under Section 564(b)(1) of the Act, 21 U.S.C. section 360bbb-3(b)(1),  unless the authorization is terminated or revoked.  Performed at [facility], [address], [place], [place] [address]   Blood culture (routine x 2)     Status: None (Preliminary result)   Collection Time: 05/22/20 10:45 AM   Specimen: BLOOD LEFT HAND  Result Value Ref Range Status   Specimen Description   Final    BLOOD LEFT HAND BOTTLES DRAWN AEROBIC AND ANAEROBIC   Special Requests   Final    Blood Culture results may not be optimal due to an inadequate volume of blood received in culture bottles   Culture   Final    NO GROWTH 4 DAYS Performed at [facility], [address], [place], [place] [address]    Report Status PENDING  Incomplete  Blood culture (routine x 2)     Status: None (Preliminary result)   Collection Time: 05/22/20 10:52 AM   Specimen: Right Antecubital; Blood  Result Value Ref Range Status   Specimen Description   Final    RIGHT ANTECUBITAL BOTTLES DRAWN AEROBIC AND ANAEROBIC   Special Requests Blood Culture adequate volume  Final   Culture   Final    NO GROWTH 4 DAYS Performed at [facility], [address], [place], [place] [address]    Report Status PENDING  Incomplete  Resp Panel by RT-PCR (Flu A&B, Covid)     Status: None   Collection Time: 05/23/20  6:40 PM  Result Value Ref Range Status   SARS Coronavirus 2 by RT PCR NEGATIVE NEGATIVE Final    Comment: (NOTE) SARS-CoV-2 target nucleic acids are NOT DETECTED.  The SARS-CoV-2 RNA is generally detectable in upper respiratory specimens during the acute phase of infection. The lowest concentration of SARS-CoV-2 viral copies this assay can detect is 138 copies/mL. A negative result does not preclude SARS-Cov-2 infection and should not be used as the sole basis for treatment or other patient management decisions. A negative result may occur with  improper specimen collection/handling, submission of specimen other than nasopharyngeal swab, presence of viral mutation(s) within the areas targeted by  this assay, and inadequate number of viral copies(<138 copies/mL). A negative result must be combined with clinical observations, patient history, and epidemiological information. The expected result is Negative.  Fact Sheet for Patients:  BloggerCourse.com  Fact Sheet for Healthcare Providers:  SeriousBroker.it  This test is no t yet approved or cleared by the [place] FDA and  has been authorized for detection and/or diagnosis of SARS-CoV-2 by FDA under an Emergency Use Authorization (EUA). This EUA will remain  in effect (meaning this test can be used) for the duration of the COVID-19 declaration under Section 564(b)(1) of the Act, 21 U.S.C.section 360bbb-3(b)(1), unless the authorization is terminated  or revoked sooner.       Influenza A by PCR NEGATIVE NEGATIVE Final   Influenza B by PCR NEGATIVE NEGATIVE Final    Comment: (NOTE) The Xpert Xpress SARS-CoV-2/FLU/RSV plus assay is intended as an aid in the diagnosis of influenza from Nasopharyngeal swab specimens and should not be used as a sole basis for treatment. Nasal washings and aspirates are unacceptable for Xpert Xpress SARS-CoV-2/FLU/RSV testing.  Fact Sheet for Patients: BloggerCourse.com  Fact Sheet for Healthcare Providers: SeriousBroker.it  This test is not yet approved or cleared by the [place] FDA and has been authorized for detection and/or diagnosis of SARS-CoV-2 by FDA under an Emergency Use Authorization (EUA). This EUA will remain in effect (meaning this test can be used) for the duration of the COVID-19 declaration under Section 564(b)(1) of the Act, 21 U.S.C. section 360bbb-3(b)(1), unless the authorization is terminated or revoked.  Performed at [facility], [address] [place], [place] [address]   Respiratory Panel by PCR     Status: None   Collection Time: 05/24/20 10:04 AM    Specimen: Nasopharyngeal Swab; Respiratory  Result Value Ref Range Status   Adenovirus NOT DETECTED NOT DETECTED Final   Coronavirus 229E NOT DETECTED NOT DETECTED Final    Comment: (NOTE) The Coronavirus on the Respiratory Panel, DOES NOT test for the novel  Coronavirus (2019 nCoV)    Coronavirus HKU1 NOT DETECTED NOT DETECTED Final   Coronavirus NL63 NOT DETECTED NOT DETECTED Final   Coronavirus OC43 NOT DETECTED NOT DETECTED Final   Metapneumovirus NOT DETECTED NOT DETECTED Final   Rhinovirus / Enterovirus NOT DETECTED NOT DETECTED Final   Influenza A NOT DETECTED NOT DETECTED Final   Influenza B NOT DETECTED NOT DETECTED Final   Parainfluenza Virus 1 NOT DETECTED NOT DETECTED Final   Parainfluenza Virus 2 NOT DETECTED NOT DETECTED Final   Parainfluenza Virus 3 NOT DETECTED NOT DETECTED Final   Parainfluenza Virus 4 NOT DETECTED NOT DETECTED Final   Respiratory Syncytial Virus NOT DETECTED NOT DETECTED Final   Bordetella pertussis NOT  DETECTED NOT DETECTED Final   Chlamydophila pneumoniae NOT DETECTED NOT DETECTED Final   Mycoplasma pneumoniae NOT DETECTED NOT DETECTED Final    Comment: Performed at [facility], [place] [address], [place], [place] [address]     Scheduled Meds: . apixaban  2.5 mg Oral BID  . carvedilol  3.125 mg Oral BID WC  . ferrous sulfate  325 mg Oral Q breakfast  . guaiFENesin  600 mg Oral BID  . ipratropium-albuterol  3 mL Nebulization BID  . methocarbamol  500 mg Oral TID  . methylPREDNISolone (SOLU-MEDROL) injection  80 mg Intravenous Q12H  . polyethylene glycol  17 g Oral Daily  . pravastatin  20 mg Oral QPM  . senna  2 tablet Oral Daily  . sodium chloride flush  3 mL Intravenous Q12H  . sodium chloride flush  3 mL Intravenous Q12H  . tamsulosin  0.4 mg Oral Daily   Continuous Infusions: . sodium chloride      Procedures/Studies: CT ABDOMEN PELVIS WO CONTRAST  Result Date: 05/24/2020 CLINICAL DATA:  Generalized weakness and fever of  unknown origin. EXAM: CT ABDOMEN AND PELVIS WITHOUT CONTRAST TECHNIQUE: Multidetector CT imaging of the abdomen and pelvis was performed following the standard protocol without IV contrast. COMPARISON:  Chest CT dated 05/22/2020 and renal ultrasound dated 06/02/2009. FINDINGS: Lower chest: Cardiomegaly is partially imaged. There are small bilateral pleural effusions which appear similar to prior exam. Diffuse bilateral ground-glass opacities appear unchanged from prior exam. Hepatobiliary: No focal liver abnormality is seen. No gallstones, gallbladder wall thickening, or biliary dilatation. Pancreas: Unremarkable. No pancreatic ductal dilatation or surrounding inflammatory changes. Spleen: Normal in size without focal abnormality. Adrenals/Urinary Tract: Adrenal glands are unremarkable. Bilateral renal cysts are noted, measuring up to 2.1 cm on the right and 1.8 cm on the left. Kidneys are normal, without renal calculi, focal lesion, or hydronephrosis. Multiple urinary bladder diverticula are noted and there is associated urinary bladder wall thickening, likely reflecting bladder outlet obstruction given the patient's large prostate. Stomach/Bowel: Stomach is within normal limits. Colonic diverticula are noted, particularly in the sigmoid colon. There is a mild fat stranding surrounding the proximal sigmoid colon (series 5 images 42-49) which may reflect acute diverticulitis. No evidence of abscess or fistula formation. No pericecal inflammatory changes are noted to suggest acute appendicitis. No evidence of bowel obstruction. Vascular/Lymphatic: Aortic atherosclerosis. No enlarged abdominal or pelvic lymph nodes. Reproductive: The prostate is enlarged, measuring 6.5 cm in transverse dimension, and indents the bladder. Other: No abdominal wall hernia or abnormality. No abdominopelvic ascites. Musculoskeletal: Chronic appearing wedge deformities are seen at T12 and L4 with less than 25% height loss. Insert  degenerative IMPRESSION: 1. Mild fat stranding surrounding the proximal sigmoid colon may reflect acute diverticulitis. No evidence of complication. 2. Multiple urinary bladder diverticula with associated urinary bladder wall thickening, likely reflecting bladder outlet obstruction given the patient's large prostate. 3. Small bilateral pleural effusions with diffuse bilateral ground-glass opacities appear unchanged from prior exam. Aortic Atherosclerosis (ICD10-I70.0). Electronically Signed   By: Zerita Boers M.D.   On: 05/24/2020 16:46   CT Angio Chest PE W and/or Wo Contrast  Result Date: 05/22/2020 CLINICAL DATA:  86 year old male with chest pain. Weakness. Shortness of breath. Abnormal D-dimer. EXAM: CT ANGIOGRAPHY CHEST WITH CONTRAST TECHNIQUE: Multidetector CT imaging of the chest was performed using the standard protocol during bolus administration of intravenous contrast. Multiplanar CT image reconstructions and MIPs were obtained to evaluate the vascular anatomy. CONTRAST:  76mL OMNIPAQUE IOHEXOL  350 MG/ML SOLN COMPARISON:  Portable chest earlier today. Chest radiographs 07/09/2015. Neck CT, CTA 07/03/2017 and earlier. FINDINGS: Cardiovascular: Good contrast bolus timing in the pulmonary arterial tree. No focal filling defect identified in the pulmonary arteries to suggest acute pulmonary embolism. Mild to moderate enlargement of central pulmonary arteries relative to the aorta (series 3, image 147). Calcified aortic atherosclerosis. Calcified coronary artery atherosclerosis. Cardiomegaly. Left chest cardiac pacemaker type device. No pericardial effusion. Mediastinum/Nodes: Negative.  No lymphadenopathy. Lungs/Pleura: Major airways are patent. Widespread although fairly symmetric peripheral, peribronchial, and also in the lung apices this is new or progressed since 2019. No consolidation. Trace layering pleural fluid greater on the left. And dependent pulmonary ground-glass opacity in both lungs Upper  Abdomen: Negative for age visible upper abdominal viscera Musculoskeletal: Chronic T12 superior endplate compression suspected to be stable since 2017 radiographs. No acute osseous abnormality identified. Review of the MIP images confirms the above findings. IMPRESSION: 1. Negative for pulmonary embolus. 2. Widespread abnormal although nonspecific pulmonary ground-glass opacity with trace layering pleural fluid. Acute viral/atypical respiratory infection not excluded. Chronic interstitial lung disease is also a consideration. 3. Cardiomegaly with mild to moderate enlargement of the central pulmonary arteries raising the possibility of pulmonary artery hypertension. Aortic Atherosclerosis (ICD10-I70.0). Electronically Signed   By: Genevie Ann M.D.   On: 05/22/2020 14:38   DG CHEST PORT 1 VIEW  Result Date: 05/25/2020 CLINICAL DATA:  Shortness of breath EXAM: PORTABLE CHEST 1 VIEW COMPARISON:  05/22/2020 FINDINGS: Likely chronic interstitial lung changes. Patchy superimposed increased density. No significant pleural effusion. No pneumothorax. Stable cardiomediastinal contours. Left chest wall pacemaker. IMPRESSION: Patchy increased density superimposed on likely chronic interstitial lung disease. This may reflect superimposed pneumonia or edema. Electronically Signed   By: Macy Mis M.D.   On: 05/25/2020 08:27   DG Chest Port 1 View  Result Date: 05/22/2020 CLINICAL DATA:  Weakness and shortness of breath. EXAM: PORTABLE CHEST 1 VIEW COMPARISON:  Chest x-ray 07/09/2015. FINDINGS: AICD in stable position. Cardiomegaly with mild bilateral interstitial prominence suggesting interstitial edema. Pneumonitis cannot be excluded. No pleural effusion or pneumothorax. IMPRESSION: AICD in stable position. Cardiomegaly with mild bilateral interstitial prominence suggesting interstitial edema. Pneumonitis cannot be excluded. Electronically Signed   By: Marcello Moores  Register   On: 05/22/2020 11:10   CUP PACEART REMOTE  DEVICE CHECK  Result Date: 05/06/2020 Scheduled remote reviewed. Normal device function.  1 SVT logged 7 minutes 12 seconds w/ rates 160's bpm 1 ATR - same event as above 10 PMT successfully terminated by PMT algorithm Next remote 91 days.   Irwin Brakeman, MD  [facility]  If 7PM-7AM, please contact night-coverage [email or web address] Password [email or web address] 05/27/2020, 6:26 PM   LOS: 5 days

## 2020-05-28 DIAGNOSIS — J9601 Acute respiratory failure with hypoxia: Secondary | ICD-10-CM | POA: Diagnosis not present

## 2020-05-28 DIAGNOSIS — Z515 Encounter for palliative care: Secondary | ICD-10-CM

## 2020-05-28 DIAGNOSIS — I4891 Unspecified atrial fibrillation: Secondary | ICD-10-CM | POA: Diagnosis not present

## 2020-05-28 DIAGNOSIS — I5042 Chronic combined systolic (congestive) and diastolic (congestive) heart failure: Secondary | ICD-10-CM | POA: Diagnosis not present

## 2020-05-28 DIAGNOSIS — I482 Chronic atrial fibrillation, unspecified: Secondary | ICD-10-CM | POA: Diagnosis not present

## 2020-05-28 DIAGNOSIS — Z66 Do not resuscitate: Secondary | ICD-10-CM

## 2020-05-28 DIAGNOSIS — I5022 Chronic systolic (congestive) heart failure: Secondary | ICD-10-CM | POA: Diagnosis not present

## 2020-05-28 DIAGNOSIS — Z7189 Other specified counseling: Secondary | ICD-10-CM | POA: Diagnosis not present

## 2020-05-28 LAB — COMPREHENSIVE METABOLIC PANEL
ALT: 25 U/L (ref 0–44)
AST: 22 U/L (ref 15–41)
Albumin: 2.1 g/dL — ABNORMAL LOW (ref 3.5–5.0)
Alkaline Phosphatase: 62 U/L (ref 38–126)
Anion gap: 9 (ref 5–15)
BUN: 41 mg/dL — ABNORMAL HIGH (ref 8–23)
CO2: 28 mmol/L (ref 22–32)
Calcium: 8.2 mg/dL — ABNORMAL LOW (ref 8.9–10.3)
Chloride: 97 mmol/L — ABNORMAL LOW (ref 98–111)
Creatinine, Ser: 1.52 mg/dL — ABNORMAL HIGH (ref 0.61–1.24)
GFR, Estimated: 43 mL/min — ABNORMAL LOW (ref >60)
Glucose, Bld: 128 mg/dL — ABNORMAL HIGH (ref 70–99)
Potassium: 3.9 mmol/L (ref 3.5–5.1)
Sodium: 134 mmol/L — ABNORMAL LOW (ref 135–145)
Total Bilirubin: 0.5 mg/dL (ref 0.3–1.2)
Total Protein: 5.5 g/dL — ABNORMAL LOW (ref 6.5–8.1)

## 2020-05-28 LAB — CBC WITH DIFFERENTIAL/PLATELET
Abs Immature Granulocytes: 0.25 10*3/uL — ABNORMAL HIGH (ref 0.00–0.07)
Basophils Absolute: 0 10*3/uL (ref 0.0–0.1)
Basophils Relative: 0 %
Eosinophils Absolute: 0 10*3/uL (ref 0.0–0.5)
Eosinophils Relative: 0 %
HCT: 29.9 % — ABNORMAL LOW (ref 39.0–52.0)
Hemoglobin: 10 g/dL — ABNORMAL LOW (ref 13.0–17.0)
Immature Granulocytes: 2 %
Lymphocytes Relative: 3 %
Lymphs Abs: 0.5 10*3/uL — ABNORMAL LOW (ref 0.7–4.0)
MCH: 32.5 pg (ref 26.0–34.0)
MCHC: 33.4 g/dL (ref 30.0–36.0)
MCV: 97.1 fL (ref 80.0–100.0)
Monocytes Absolute: 0.8 10*3/uL (ref 0.1–1.0)
Monocytes Relative: 5 %
Neutro Abs: 14.7 10*3/uL — ABNORMAL HIGH (ref 1.7–7.7)
Neutrophils Relative %: 90 %
Platelets: 574 10*3/uL — ABNORMAL HIGH (ref 150–400)
RBC: 3.08 MIL/uL — ABNORMAL LOW (ref 4.22–5.81)
RDW: 12.4 % (ref 11.5–15.5)
WBC: 16.2 10*3/uL — ABNORMAL HIGH (ref 4.0–10.5)
nRBC: 0 % (ref 0.0–0.2)

## 2020-05-28 LAB — CULTURE, BLOOD (ROUTINE X 2)
Culture: NO GROWTH
Culture: NO GROWTH
Special Requests: ADEQUATE

## 2020-05-28 LAB — MAGNESIUM: Magnesium: 2.2 mg/dL (ref 1.7–2.4)

## 2020-05-28 MED ORDER — METHYLPREDNISOLONE SODIUM SUCC 40 MG IJ SOLR
40.0000 mg | Freq: Two times a day (BID) | INTRAMUSCULAR | Status: DC
  Administered 2020-05-28 – 2020-06-03 (×13): 40 mg via INTRAVENOUS
  Filled 2020-05-28 (×13): qty 1

## 2020-05-28 MED ORDER — POLYVINYL ALCOHOL 1.4 % OP SOLN: 1.0000 [drp] | OPHTHALMIC | Status: DC | PRN

## 2020-05-28 MED ORDER — POLYVINYL ALCOHOL 1.4 % OP SOLN
1.0000 [drp] | Freq: Three times a day (TID) | OPHTHALMIC | Status: DC
  Administered 2020-05-28 – 2020-06-03 (×16): 1 [drp] via OPHTHALMIC
  Filled 2020-05-28 (×5): qty 15

## 2020-05-28 NOTE — Plan of Care (Signed)
  Problem: Education: Goal: Knowledge of General Education information will improve Description Including pain rating scale, medication(s)/side effects and non-pharmacologic comfort measures Outcome: Progressing   Problem: Health Behavior/Discharge Planning: Goal: Ability to manage health-related needs will improve Outcome: Progressing   

## 2020-05-28 NOTE — Progress Notes (Signed)
PROGRESS NOTE  Max Cole SNK:[id number] DOB: 11-13-1927 DOA: 05/22/2020 PCP: Asencion Noble, MD  Brief History: 86 y.o.malewith medical history ofsystolic CHF status postBiV PPM, CKD stage III, carotid stenosis, hyperlipidemia,PAF on apixabanand hypertension presentingapproximately 10-day history of generalized weakness, dyspnea on exertion, fevers and chills, and shortness of breath. Apparently, the patient stated that he saw his primary care provider approximately 2 to 3 weeks prior to this admission. He was placed on a prednisone taper. There is no improvement of his symptoms. He took some over-the-counter NyQuil with no improvement. He called his PCP again, the patient was placed on empiric dose of Tamiflu. He continued to have malaise and shortness of breath. As result, he presented for further evaluation. He denied any nausea, vomiting, diarrhea, chest pain, hemoptysis, abdominal pain, dysuria, hematuria, hematochezia, melena. The patient has not started any other new medications except for Robaxin and prednisone. He denies any worsening lower extremity edema, orthopnea, chest pain, headache, sore throat.He has one cat, no exotic pets. No travels. In the emergency department, the patient was febrile up to 100.55F. He was hemodynamically stable with oxygen saturation down to 80% on room air. He was placed on 4 L nasal cannula with saturation 93-94%. CTA chest was negative for PE but showed bilateral widespread groundglass opacities there was cardiomegaly with enlarged central pulmonary arteries. There is a chronic T12 endplate fracture which was stable compared to 2017.  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  Atypical pneumonia/lobar pneumonia -Urine Legionella antigen--neg -Urine Streptococcus pneumonia antigen--neg -Viral respiratory panel--neg -Continue azithromycin and ceftriaxone -Lactic acid 1.3  FUO -due to amiodarone toxicity--last dose 11/29 -now afebrile -UA neg for pyuria -CT chest as discussed above -blood cultures remain neg -CT abd/pelvis--sm bilateral pleural eff; unchanged GGO; mild fat stranding around sigmoid colon; ur bladder wall thickening  Paroxysmal atrial fibrillation -CHADSVASc = 5 -continue apixaban -currently in sinus -Discontinued amiodarone due to pulmonary toxicity, add to allergies -continue coreg  CKD stage IIIb -Baseline creatinine 9.3-7.9  Chronic systolic and diastolic CHF -Clinically euvolemic -05/21/2015 echo EF 20 to 25%, grade 1 DD -02/07/2020 echo EF 50-55%, no WMA, grade 1 DD, mild AR -Clinically compensated presently -Daily weights  Left carotid stenosis -Continue apixaban  Hyperlipidemia -Continue statin  BPH -Continue tamsulosin   Status is: Inpatient  Remains inpatient appropriate because:IV treatments appropriate due to intensity of illness or inability to take PO  Dispo: The patient is from:Home Anticipated d/c is KW:IOXB Anticipated d/c date is: 2 days Patient currently is not medically stable to d/c.   Family Communication:Daughter updatedat bedside 12/1  Consultants:none  Code Status: FULL  DVT Prophylaxis:apixaban   Procedures: As Listed in  Progress Note Above  Antibiotics: Ceftriaxone 11/27>> azithro11/26>>  Total time spent 35 minutes.  Greater than 50% spent face to face counseling and coordinating care.   Subjective: Patient not sleeping well.    Objective: Vitals:   05/27/20 2052 05/28/20 0423 05/28/20 0718 05/28/20 1411  BP:  (!) 165/88  Marland Kitchen)  174/85  Pulse:  88  80  Resp:  18  18  Temp:  98.8 F (37.1 C)  97.6 F (36.4 C)  TempSrc:    Oral  SpO2: 94% 93% 92% 93%  Weight:      Height:        Intake/Output Summary (Last 24 hours) at 05/28/2020 1901 Last data filed at 05/28/2020 1413 Gross per 24 hour  Intake 480 ml  Output 700 ml  Net -220 ml   Weight change:  Exam:   General:  Awake, alert, NAD, cooperative.   HEENT: No icterus, No thrush, No neck mass, [place]/AT  Cardiovascular: normal S1/S2, no rubs, no gallops  Respiratory: BBS with crackles heard.   Abdomen: Soft/+BS, non tender, non distended, no guarding  Extremities: No edema, No lymphangitis, No petechiae, No rashes, no synovitis   Data Reviewed: I have personally reviewed following labs and imaging studies Basic Metabolic Panel: Recent Labs  Lab 05/24/20 0653 05/25/20 0706 05/26/20 1050 05/27/20 0602 05/28/20 0528  NA 131* 130* 131* 132* 134*  K 3.9 3.9 3.4* 3.5 3.9  CL 97* 95* 94* 97* 97*  CO2 _0 GLUCOSE 95 103* 226* 129* 128*  BUN 25* 19 32* 37* 41*  CREATININE 1.47* 1.36* 1.55* 1.37* 1.52*  CALCIUM 8.1* 8.2* 8.4* 8.1* 8.2*  MG 1.8  --  1.9  --  2.2   Liver Function Tests: Recent Labs  Lab 05/22/20 1052 05/23/20 0729 05/24/20 0942 05/25/20 0706 05/28/20 0528  AST _1 ALT _2 ALKPHOS 70 55 58 62 62  BILITOT 0.5 0.5 0.5 0.7 0.5  PROT 6.8 5.4* 5.9* 6.0* 5.5*  ALBUMIN 2.7* 2.1* 2.3* 2.1* 2.1*   No results for input(s): LIPASE, AMYLASE in the last 168 hours. No results for input(s): AMMONIA in the last 168 hours. Coagulation Profile: No results for input(s): INR, PROTIME  in the last 168 hours. CBC: Recent Labs  Lab 05/22/20 1052 05/23/20 0729 05/23/20 1137 05/24/20 0653 05/25/20 0706 05/27/20 0602 05/28/20 0528  WBC 9.9   < > 9.3 12.0* 12.0* 19.9* 16.2*  NEUTROABS 7.7  --   --  9.7* 9.4* 18.2* 14.7*  HGB 11.3*   < > 10.1* 10.2* 10.2* 9.6* 10.0*  HCT 34.4*   < > 30.7* 31.6* 30.6* 28.5* 29.9*  MCV 98.6   < > 99.7 100.0 97.1 96.0 97.1  PLT 343   < > 328 402* 480* 572* 574*   < > = values in this interval not displayed.   Cardiac Enzymes: No results for input(s): CKTOTAL, CKMB, CKMBINDEX, TROPONINI in the last 168 hours. BNP: Invalid input(s): POCBNP CBG: No results for input(s): GLUCAP in the last 168 hours. HbA1C: No results for input(s): HGBA1C in the last 72 hours. Urine analysis:    Component Value Date/Time   COLORURINE YELLOW 05/22/2020 [place] 05/22/2020 1041   LABSPEC 1.015 05/22/2020 1041   PHURINE 6.0 05/22/2020 1041   GLUCOSEU NEGATIVE 05/22/2020 1041   GLUCOSEU neg 02/26/2009 0000   HGBUR NEGATIVE 05/22/2020 [place] 05/22/2020 [place] 05/22/2020 1041   PROTEINUR 30 (A) 05/22/2020 1041   NITRITE NEGATIVE 05/22/2020 1041   LEUKOCYTESUR NEGATIVE 05/22/2020 1041    Recent Results (from the past 240 hour(s))  Resp Panel by RT-PCR (Flu A&B, Covid) Nasopharyngeal Swab     Status: None   Collection Time: 05/22/20  9:57 AM  Specimen: Nasopharyngeal Swab; Nasopharyngeal(NP) swabs in vial transport medium  Result Value Ref Range Status   SARS Coronavirus 2 by RT PCR NEGATIVE NEGATIVE Final    Comment: (NOTE) SARS-CoV-2 target nucleic acids are NOT DETECTED.  The SARS-CoV-2 RNA is generally detectable in upper respiratory specimens during the acute phase of infection. The lowest concentration of SARS-CoV-2 viral copies this assay can detect is 138 copies/mL. A negative result does not preclude SARS-Cov-2 infection and should not be used as the sole basis for treatment  or other patient management decisions. A negative result may occur with  improper specimen collection/handling, submission of specimen other than nasopharyngeal swab, presence of viral mutation(s) within the areas targeted by this assay, and inadequate number of viral copies(<138 copies/mL). A negative result must be combined with clinical observations, patient history, and epidemiological information. The expected result is Negative.  Fact Sheet for Patients:  EntrepreneurPulse.com.au  Fact Sheet for Healthcare Providers:  IncredibleEmployment.be  This test is no t yet approved or cleared by the [place] FDA and  has been authorized for detection and/or diagnosis of SARS-CoV-2 by FDA under an Emergency Use Authorization (EUA). This EUA will remain  in effect (meaning this test can be used) for the duration of the COVID-19 declaration under Section 564(b)(1) of the Act, 21 U.S.C.section 360bbb-3(b)(1), unless the authorization is terminated  or revoked sooner.       Influenza A by PCR NEGATIVE NEGATIVE Final   Influenza B by PCR NEGATIVE NEGATIVE Final    Comment: (NOTE) The Xpert Xpress SARS-CoV-2/FLU/RSV plus assay is intended as an aid in the diagnosis of influenza from Nasopharyngeal swab specimens and should not be used as a sole basis for treatment. Nasal washings and aspirates are unacceptable for Xpert Xpress SARS-CoV-2/FLU/RSV testing.  Fact Sheet for Patients: EntrepreneurPulse.com.au  Fact Sheet for Healthcare Providers: IncredibleEmployment.be  This test is not yet approved or cleared by the [place] FDA and has been authorized for detection and/or diagnosis of SARS-CoV-2 by FDA under an Emergency Use Authorization (EUA). This EUA will remain in effect (meaning this test can be used) for the duration of the COVID-19 declaration under Section 564(b)(1) of the Act, 21 U.S.C. section  360bbb-3(b)(1), unless the authorization is terminated or revoked.  Performed at [facility], [address] [place], [place] [address]   Blood culture (routine x 2)     Status: None   Collection Time: 05/22/20 10:45 AM   Specimen: BLOOD LEFT HAND  Result Value Ref Range Status   Specimen Description   Final    BLOOD LEFT HAND BOTTLES DRAWN AEROBIC AND ANAEROBIC   Special Requests   Final    Blood Culture results may not be optimal due to an inadequate volume of blood received in culture bottles   Culture   Final    NO GROWTH 6 DAYS Performed at [facility], [address], [place], [place] [address]    Report Status 05/28/2020 FINAL  Final  Blood culture (routine x 2)     Status: None   Collection Time: 05/22/20 10:52 AM   Specimen: Right Antecubital; Blood  Result Value Ref Range Status   Specimen Description   Final    RIGHT ANTECUBITAL BOTTLES DRAWN AEROBIC AND ANAEROBIC   Special Requests Blood Culture adequate volume  Final   Culture   Final    NO GROWTH 6 DAYS Performed at [facility], [address], [place],  [address]    Report Status 05/28/2020 FINAL  Final  Resp Panel by RT-PCR (Flu A&B, Covid)     Status: None   Collection Time: 05/23/20  6:40 PM  Result Value Ref Range Status   SARS Coronavirus 2 by RT PCR NEGATIVE NEGATIVE Final    Comment: (NOTE) SARS-CoV-2 target nucleic acids are NOT DETECTED.  The SARS-CoV-2 RNA is generally detectable in upper respiratory specimens during the acute phase of infection. The lowest concentration of SARS-CoV-2 viral copies this assay can detect is 138 copies/mL. A negative result does not preclude SARS-Cov-2 infection and should not be used as the sole basis for treatment or other patient management decisions. A negative result may occur with  improper specimen collection/handling, submission of specimen other than nasopharyngeal swab, presence of viral mutation(s) within the areas targeted by this assay, and  inadequate number of viral copies(<138 copies/mL). A negative result must be combined with clinical observations, patient history, and epidemiological information. The expected result is Negative.  Fact Sheet for Patients:  EntrepreneurPulse.com.au  Fact Sheet for Healthcare Providers:  IncredibleEmployment.be  This test is no t yet approved or cleared by the [place] FDA and  has been authorized for detection and/or diagnosis of SARS-CoV-2 by FDA under an Emergency Use Authorization (EUA). This EUA will remain  in effect (meaning this test can be used) for the duration of the COVID-19 declaration under Section 564(b)(1) of the Act, 21 U.S.C.section 360bbb-3(b)(1), unless the authorization is terminated  or revoked sooner.       Influenza A by PCR NEGATIVE NEGATIVE Final   Influenza B by PCR NEGATIVE NEGATIVE Final    Comment: (NOTE) The Xpert Xpress SARS-CoV-2/FLU/RSV plus assay is intended as an aid in the diagnosis of influenza from Nasopharyngeal swab specimens and should not be used as a sole basis for treatment. Nasal washings and aspirates are unacceptable for Xpert Xpress SARS-CoV-2/FLU/RSV testing.  Fact Sheet for Patients: EntrepreneurPulse.com.au  Fact Sheet for Healthcare Providers: IncredibleEmployment.be  This test is not yet approved or cleared by the [place] FDA and has been authorized for detection and/or diagnosis of SARS-CoV-2 by FDA under an Emergency Use Authorization (EUA). This EUA will remain in effect (meaning this test can be used) for the duration of the COVID-19 declaration under Section 564(b)(1) of the Act, 21 U.S.C. section 360bbb-3(b)(1), unless the authorization is terminated or revoked.  Performed at [facility], [address] [place], [place] [address]   Respiratory Panel by PCR     Status: None   Collection Time: 05/24/20 10:04 AM   Specimen:  Nasopharyngeal Swab; Respiratory  Result Value Ref Range Status   Adenovirus NOT DETECTED NOT DETECTED Final   Coronavirus 229E NOT DETECTED NOT DETECTED Final    Comment: (NOTE) The Coronavirus on the Respiratory Panel, DOES NOT test for the novel  Coronavirus (2019 nCoV)    Coronavirus HKU1 NOT DETECTED NOT DETECTED Final   Coronavirus NL63 NOT DETECTED NOT DETECTED Final   Coronavirus OC43 NOT DETECTED NOT DETECTED Final   Metapneumovirus NOT DETECTED NOT DETECTED Final   Rhinovirus / Enterovirus NOT DETECTED NOT DETECTED Final   Influenza A NOT DETECTED NOT DETECTED Final   Influenza B NOT DETECTED NOT DETECTED Final   Parainfluenza Virus 1 NOT DETECTED NOT DETECTED Final   Parainfluenza Virus 2 NOT DETECTED NOT DETECTED Final   Parainfluenza Virus 3 NOT DETECTED NOT DETECTED Final   Parainfluenza Virus 4 NOT DETECTED NOT DETECTED Final   Respiratory Syncytial Virus NOT DETECTED NOT DETECTED Final   Bordetella pertussis NOT DETECTED NOT  DETECTED Final   Chlamydophila pneumoniae NOT DETECTED NOT DETECTED Final   Mycoplasma pneumoniae NOT DETECTED NOT DETECTED Final    Comment: Performed at [facility], [place] [address] [place], [place] [address]     Scheduled Meds: . apixaban  2.5 mg Oral BID  . carvedilol  3.125 mg Oral BID WC  . ferrous sulfate  325 mg Oral Q breakfast  . guaiFENesin  600 mg Oral BID  . ipratropium-albuterol  3 mL Nebulization BID  . methocarbamol  500 mg Oral TID  . methylPREDNISolone (SOLU-MEDROL) injection  40 mg Intravenous Q12H  . polyethylene glycol  17 g Oral Daily  . polyvinyl alcohol  1 drop Both Eyes TID  . pravastatin  20 mg Oral QPM  . senna  2 tablet Oral Daily  . sodium chloride flush  3 mL Intravenous Q12H  . sodium chloride flush  3 mL Intravenous Q12H  . tamsulosin  0.4 mg Oral Daily   Continuous Infusions: . sodium chloride      Procedures/Studies: CT ABDOMEN PELVIS WO CONTRAST  Result Date: 05/24/2020 CLINICAL DATA:   Generalized weakness and fever of unknown origin. EXAM: CT ABDOMEN AND PELVIS WITHOUT CONTRAST TECHNIQUE: Multidetector CT imaging of the abdomen and pelvis was performed following the standard protocol without IV contrast. COMPARISON:  Chest CT dated 05/22/2020 and renal ultrasound dated 06/02/2009. FINDINGS: Lower chest: Cardiomegaly is partially imaged. There are small bilateral pleural effusions which appear similar to prior exam. Diffuse bilateral ground-glass opacities appear unchanged from prior exam. Hepatobiliary: No focal liver abnormality is seen. No gallstones, gallbladder wall thickening, or biliary dilatation. Pancreas: Unremarkable. No pancreatic ductal dilatation or surrounding inflammatory changes. Spleen: Normal in size without focal abnormality. Adrenals/Urinary Tract: Adrenal glands are unremarkable. Bilateral renal cysts are noted, measuring up to 2.1 cm on the right and 1.8 cm on the left. Kidneys are normal, without renal calculi, focal lesion, or hydronephrosis. Multiple urinary bladder diverticula are noted and there is associated urinary bladder wall thickening, likely reflecting bladder outlet obstruction given the patient's large prostate. Stomach/Bowel: Stomach is within normal limits. Colonic diverticula are noted, particularly in the sigmoid colon. There is a mild fat stranding surrounding the proximal sigmoid colon (series 5 images 42-49) which may reflect acute diverticulitis. No evidence of abscess or fistula formation. No pericecal inflammatory changes are noted to suggest acute appendicitis. No evidence of bowel obstruction. Vascular/Lymphatic: Aortic atherosclerosis. No enlarged abdominal or pelvic lymph nodes. Reproductive: The prostate is enlarged, measuring 6.5 cm in transverse dimension, and indents the bladder. Other: No abdominal wall hernia or abnormality. No abdominopelvic ascites. Musculoskeletal: Chronic appearing wedge deformities are seen at T12 and L4 with less than  25% height loss. Insert degenerative IMPRESSION: 1. Mild fat stranding surrounding the proximal sigmoid colon may reflect acute diverticulitis. No evidence of complication. 2. Multiple urinary bladder diverticula with associated urinary bladder wall thickening, likely reflecting bladder outlet obstruction given the patient's large prostate. 3. Small bilateral pleural effusions with diffuse bilateral ground-glass opacities appear unchanged from prior exam. Aortic Atherosclerosis (ICD10-I70.0). Electronically Signed   By: Zerita Boers M.D.   On: 05/24/2020 16:46   CT Angio Chest PE W and/or Wo Contrast  Result Date: 05/22/2020 CLINICAL DATA:  86 year old male with chest pain. Weakness. Shortness of breath. Abnormal D-dimer. EXAM: CT ANGIOGRAPHY CHEST WITH CONTRAST TECHNIQUE: Multidetector CT imaging of the chest was performed using the standard protocol during bolus administration of intravenous contrast. Multiplanar CT image reconstructions and MIPs were obtained to evaluate  the vascular anatomy. CONTRAST:  66m OMNIPAQUE IOHEXOL 350 MG/ML SOLN COMPARISON:  Portable chest earlier today. Chest radiographs 07/09/2015. Neck CT, CTA 07/03/2017 and earlier. FINDINGS: Cardiovascular: Good contrast bolus timing in the pulmonary arterial tree. No focal filling defect identified in the pulmonary arteries to suggest acute pulmonary embolism. Mild to moderate enlargement of central pulmonary arteries relative to the aorta (series 3, image 147). Calcified aortic atherosclerosis. Calcified coronary artery atherosclerosis. Cardiomegaly. Left chest cardiac pacemaker type device. No pericardial effusion. Mediastinum/Nodes: Negative.  No lymphadenopathy. Lungs/Pleura: Major airways are patent. Widespread although fairly symmetric peripheral, peribronchial, and also in the lung apices this is new or progressed since 2019. No consolidation. Trace layering pleural fluid greater on the left. And dependent pulmonary ground-glass  opacity in both lungs Upper Abdomen: Negative for age visible upper abdominal viscera Musculoskeletal: Chronic T12 superior endplate compression suspected to be stable since 2017 radiographs. No acute osseous abnormality identified. Review of the MIP images confirms the above findings. IMPRESSION: 1. Negative for pulmonary embolus. 2. Widespread abnormal although nonspecific pulmonary ground-glass opacity with trace layering pleural fluid. Acute viral/atypical respiratory infection not excluded. Chronic interstitial lung disease is also a consideration. 3. Cardiomegaly with mild to moderate enlargement of the central pulmonary arteries raising the possibility of pulmonary artery hypertension. Aortic Atherosclerosis (ICD10-I70.0). Electronically Signed   By: HGenevie AnnM.D.   On: 05/22/2020 14:38   DG CHEST PORT 1 VIEW  Result Date: 05/25/2020 CLINICAL DATA:  Shortness of breath EXAM: PORTABLE CHEST 1 VIEW COMPARISON:  05/22/2020 FINDINGS: Likely chronic interstitial lung changes. Patchy superimposed increased density. No significant pleural effusion. No pneumothorax. Stable cardiomediastinal contours. Left chest wall pacemaker. IMPRESSION: Patchy increased density superimposed on likely chronic interstitial lung disease. This may reflect superimposed pneumonia or edema. Electronically Signed   By: PMacy MisM.D.   On: 05/25/2020 08:27   DG Chest Port 1 View  Result Date: 05/22/2020 CLINICAL DATA:  Weakness and shortness of breath. EXAM: PORTABLE CHEST 1 VIEW COMPARISON:  Chest x-ray 07/09/2015. FINDINGS: AICD in stable position. Cardiomegaly with mild bilateral interstitial prominence suggesting interstitial edema. Pneumonitis cannot be excluded. No pleural effusion or pneumothorax. IMPRESSION: AICD in stable position. Cardiomegaly with mild bilateral interstitial prominence suggesting interstitial edema. Pneumonitis cannot be excluded. Electronically Signed   By: TMarcello Moores Register   On: 05/22/2020 11:10    CUP PACEART REMOTE DEVICE CHECK  Result Date: 05/06/2020 Scheduled remote reviewed. Normal device function.  1 SVT logged 7 minutes 12 seconds w/ rates 160's bpm 1 ATR - same event as above 10 PMT successfully terminated by PMT algorithm Next remote 91 days.   CIrwin Brakeman MD  [facility]  If 7PM-7AM, please contact night-coverage [email or web address] Password [email or web address] 05/28/2020, 7:01 PM   LOS: 6 days

## 2020-05-28 NOTE — Consult Note (Signed)
Consultation Note Date: 05/28/2020   Patient Name: Max Cole  DOB: [birth year]  MRN: [id number]  Age / Sex: 86 y.o., male  PCP: Asencion Noble, MD Referring Physician: Murlean Iba, MD  Reason for Consultation: Establishing goals of care  HPI/Patient Profile: 86 y.o. male  with past medical history of systolic CHF with biventricular pacemaker, chronic kidney disease stage III, carotid stenosis, HLD, PAF on apixaban and amiodarone, hypertension, admitted on 05/22/2020 with 10-day history of weakness shortness of breath fever and chills.  He had been treated outpatient by his primary care provider with a steroid taper and Tamiflu however his symptoms have continued to worsen.  Work-up revealed acute respiratory failure with hypoxia-it is now presumed that this is due to amiodarone pulmonary toxicity.  Amiodarone has been discontinued, however patient is continuing to have high supplemental oxygen requirements.  Palliative medicine consulted for assistance with goals of care.  Clinical Assessment and Goals of Care: Chart reviewed, report received from attending provider.  Met at the bedside with patient and his son and daughters. Patient was awake alert and oriented and able to participate in goals of care discussion. Prior to admission and before this several week decline patient had been very active, going to the gym daily, working out in his yard.  Living independently and independent with all ADLs.  He has not had changes in his appetite. We discussed his illness trajectory, and possible future outcomes, as well as goals of care.. [name] is very clear that his goals of care are to be able to return home, work with physical therapy, and attempt to reach a place where he could at least get out of bed and provide some care for himself.  If he is not able to resume this level of functioning, then this would  not be a quality of life that he would wish to continue aggressive treatments for. A MOST form was completed with selections of DO NOT RESUSCITATE, limited medical interventions, IV fluids if needed term, antibiotics if needed, artificial feeding for trial.  If there is a reversible cause.  Patient and family are very clear that if there was no reversible illness, and if patient was obviously declining, then patient would wish for full comfort measures only.  Primary Decision Maker PATIENT    SUMMARY OF RECOMMENDATIONS -Continue current scope of care -DNR -MOST form completed see above discussion-it is also available for viewing in [facility] -Transitions of care referral for outpatient palliative to follow  Code Status/Advance Care Planning:  DNR  Prognosis:    Unable to determine  Discharge Planning: Home with Palliative Services and home health  Primary Diagnoses: Present on Admission: . Respiratory failure with hypoxia ([place]) . (Resolved) Atrial fibrillation with RVR (Max Cole) . Chronic systolic heart failure (Max Cole) . CKD (chronic kidney disease) stage 3, GFR 30-59 ml/min (Max Cole) . Systolic and diastolic CHF, chronic (Max Cole) . Pacemaker . Hyperlipidemia . Chronic a-fib (Max Cole) . Pneumonia in infectious disease---??? Atypical Pneumonia   I have reviewed the medical record, interviewed  the patient and family, and examined the patient. The following aspects are pertinent.  Past Medical History:  Diagnosis Date  . Atrial fibrillation (Max Cole)   . Bradycardia   . Cardiomyopathy 2005   a. Possibly alcoholic; b. cath in 9233-00% ostial D1, PCW of 12, EF of 35-40%;  c. EF of 0.25 in 11/2003;  d. 40-45% in 05/2004;  e. 25% in 10/2008 by echo;  f. 04/2015 Echo: EF 40-45%, diff HK, sev inflat/inf HK, Gr 1 DD, mild AI, triv TR.  . Carotid artery occlusion   . Cerebrovascular disease    Carotid ultrasound in 12/2010-60-79% left internal carotid artery, less than 40% or right RICA, no change  . Chronic  kidney disease    Creatinine of 1.6 in 2009  . Hyperlipidemia 10/15/2011   Lipid profile in 03/2009:231, 136, 56, 148  . Left bundle branch block   . Prostate carcinoma (Max Cole)   . Syncope    [place] CRT-P 07/03/15 Max Cole   Social History   Socioeconomic History  . Marital status: Married    Spouse name: Not on file  . Number of children: Not on file  . Years of education: Not on file  . Highest education level: Not on file  Occupational History  . Not on file  Tobacco Use  . Smoking status: Former Smoker    Packs/day: 1.00    Years: 30.00    Pack years: 30.00    Quit date: 09/28/1973    Years since quitting: 46.6  . Smokeless tobacco: Never Used  Vaping Use  . Vaping Use: Never used  Substance and Sexual Activity  . Alcohol use: Yes    Alcohol/week: 5.0 standard drinks    Types: 5 Glasses of wine per week    Comment: History of excessive alcohol use; Alternates between glass of wine vs. Liquor drink 5 days per week (04/2015)  . Drug use: No  . Sexual activity: Not on file  Other Topics Concern  . Not on file  Social History Narrative  . Not on file   Social Determinants of Health   Financial Resource Strain:   . Difficulty of Paying Living Expenses: Not on file  Food Insecurity:   . Worried About Charity fundraiser in the Last Year: Not on file  . Ran Out of Food in the Last Year: Not on file  Transportation Needs:   . Lack of Transportation (Medical): Not on file  . Lack of Transportation (Non-Medical): Not on file  Physical Activity:   . Days of Exercise per Week: Not on file  . Minutes of Exercise per Session: Not on file  Stress:   . Feeling of Stress : Not on file  Social Connections:   . Frequency of Communication with Friends and Family: Not on file  . Frequency of Social Gatherings with Friends and Family: Not on file  . Attends Religious Services: Not on file  . Active Member of Clubs or Organizations: Not on file  . Attends Archivist  Meetings: Not on file  . Marital Status: Not on file   Scheduled Meds: . apixaban  2.5 mg Oral BID  . carvedilol  3.125 mg Oral BID WC  . ferrous sulfate  325 mg Oral Q breakfast  . guaiFENesin  600 mg Oral BID  . ipratropium-albuterol  3 mL Nebulization BID  . methocarbamol  500 mg Oral TID  . methylPREDNISolone (SOLU-MEDROL) injection  40 mg Intravenous Q12H  . polyethylene glycol  17 g Oral Daily  . polyvinyl alcohol  1 drop Both Eyes TID  . pravastatin  20 mg Oral QPM  . senna  2 tablet Oral Daily  . sodium chloride flush  3 mL Intravenous Q12H  . sodium chloride flush  3 mL Intravenous Q12H  . tamsulosin  0.4 mg Oral Daily   Continuous Infusions: . sodium chloride     PRN Meds:.sodium chloride, acetaminophen **OR** acetaminophen, albuterol, bisacodyl, ondansetron **OR** ondansetron (ZOFRAN) IV, polyethylene glycol, polyvinyl alcohol, sodium chloride, sodium chloride flush, traZODone Medications Prior to Admission:  Prior to Admission medications   Medication Sig Start Date End Date Taking? Authorizing Provider  acetaminophen (TYLENOL) 500 MG tablet Take 500 mg by mouth every 6 (six) hours as needed.   Yes [provider]  amiodarone (PACERONE) 200 MG tablet Take 1 tablet (200 mg total) by mouth daily. 02/29/20 05/22/20 Yes Shah, Pratik D, DO  apixaban (ELIQUIS) 2.5 MG TABS tablet Take 1 tablet (2.5 mg total) by mouth 2 (two) times daily. 02/07/20  Yes Shah, Pratik D, DO  carvedilol (COREG) 3.125 MG tablet TAKE (1) TABLET BY MOUTH TWICE DAILY WITH A MEAL. Patient taking differently: Take 3.125 mg by mouth 2 (two) times daily with a meal.  04/16/20  Yes Evans Lance, MD  ferrous sulfate 325 (65 FE) MG tablet Take 325 mg by mouth daily with breakfast.   Yes [provider]  methocarbamol (ROBAXIN) 500 MG tablet Take 1 tablet (500 mg total) by mouth 3 (three) times daily. Patient taking differently: Take 500 mg by mouth in the morning and at bedtime.  04/27/20  Yes  Carole Civil, MD  oseltamivir (TAMIFLU) 75 MG capsule Take 75 mg by mouth 2 (two) times daily. 05/18/20  Yes [provider]  pravastatin (PRAVACHOL) 20 MG tablet Take 1 tablet (20 mg total) by mouth every evening. 04/17/20  Yes Evans Lance, MD  Pseudoeph-Doxylamine-DM-APAP (NYQUIL PO) Take 1 tablet by mouth daily.   Yes [provider]  predniSONE (STERAPRED UNI-PAK 48 TAB) 10 MG (48) TBPK tablet Take by mouth daily. Patient not taking: Reported on 05/22/2020 04/27/20   Carole Civil, MD  tamsulosin (FLOMAX) 0.4 MG CAPS capsule Take 0.4 mg by mouth See admin instructions. Every other day 04/17/20   [provider]   No Known Allergies Review of Systems  Constitutional: Positive for activity change.  Eyes: Positive for redness.  Musculoskeletal: Positive for back pain.    Physical Exam Vitals and nursing note reviewed.  Cardiovascular:     Rate and Rhythm: Normal rate.  Pulmonary:     Effort: Pulmonary effort is normal.  Neurological:     Mental Status: He is alert and oriented to person, place, and time.  Psychiatric:        Mood and Affect: Mood normal.        Behavior: Behavior normal.        Thought Content: Thought content normal.        Judgment: Judgment normal.     Vital Signs: BP (!) 174/85 (BP Location: Left Arm)   Pulse 80   Temp 97.6 F (36.4 C) (Oral)   Resp 18   Ht $R'6\' 1"'TW$  (1.854 m)   Wt 83.5 kg   SpO2 93%   BMI 24.29 kg/m  Pain Scale: 0-10   Pain Score: 0-No pain   SpO2: SpO2: 93 % O2 Device:SpO2: 93 % O2 Flow Rate: .O2 Flow Rate (L/min): 10 L/min  IO: Intake/output  summary:   Intake/Output Summary (Last 24 hours) at 05/28/2020 1551 Last data filed at 05/28/2020 1413 Gross per 24 hour  Intake 730 ml  Output 700 ml  Net 30 ml    LBM: Last BM Date: 05/28/20 Baseline Weight: Weight: 80.7 kg Most recent weight: Weight: 83.5 kg     Palliative Assessment/Data: PPS: 50%     Thank you for this consult.  Palliative medicine will continue to follow and assist as needed.   Time In: 1330 Time Out: 1559 Time Total: 89 mins Greater than 50%  of this time was spent counseling and coordinating care related to the above assessment and plan.  Signed by: Mariana Kaufman, AGNP-C Palliative Medicine    Please contact Palliative Medicine Team phone at [phone] for questions and concerns.  For individual provider: See Shea Evans

## 2020-05-29 ENCOUNTER — Inpatient Hospital Stay (HOSPITAL_COMMUNITY): Payer: Medicare Other

## 2020-05-29 DIAGNOSIS — I5022 Chronic systolic (congestive) heart failure: Secondary | ICD-10-CM | POA: Diagnosis not present

## 2020-05-29 DIAGNOSIS — J181 Lobar pneumonia, unspecified organism: Secondary | ICD-10-CM | POA: Diagnosis not present

## 2020-05-29 DIAGNOSIS — I4891 Unspecified atrial fibrillation: Secondary | ICD-10-CM | POA: Diagnosis not present

## 2020-05-29 DIAGNOSIS — I482 Chronic atrial fibrillation, unspecified: Secondary | ICD-10-CM | POA: Diagnosis not present

## 2020-05-29 DIAGNOSIS — J9621 Acute and chronic respiratory failure with hypoxia: Secondary | ICD-10-CM | POA: Diagnosis not present

## 2020-05-29 DIAGNOSIS — J9601 Acute respiratory failure with hypoxia: Secondary | ICD-10-CM | POA: Diagnosis not present

## 2020-05-29 LAB — MAGNESIUM: Magnesium: 2.3 mg/dL (ref 1.7–2.4)

## 2020-05-29 LAB — COMPREHENSIVE METABOLIC PANEL
ALT: 24 U/L (ref 0–44)
AST: 20 U/L (ref 15–41)
Albumin: 2.2 g/dL — ABNORMAL LOW (ref 3.5–5.0)
Alkaline Phosphatase: 63 U/L (ref 38–126)
Anion gap: 8 (ref 5–15)
BUN: 38 mg/dL — ABNORMAL HIGH (ref 8–23)
CO2: 29 mmol/L (ref 22–32)
Calcium: 8.2 mg/dL — ABNORMAL LOW (ref 8.9–10.3)
Chloride: 98 mmol/L (ref 98–111)
Creatinine, Ser: 1.51 mg/dL — ABNORMAL HIGH (ref 0.61–1.24)
GFR, Estimated: 44 mL/min — ABNORMAL LOW (ref >60)
Glucose, Bld: 119 mg/dL — ABNORMAL HIGH (ref 70–99)
Potassium: 4.1 mmol/L (ref 3.5–5.1)
Sodium: 135 mmol/L (ref 135–145)
Total Bilirubin: 0.7 mg/dL (ref 0.3–1.2)
Total Protein: 5.6 g/dL — ABNORMAL LOW (ref 6.5–8.1)

## 2020-05-29 LAB — CBC WITH DIFFERENTIAL/PLATELET
Abs Immature Granulocytes: 0.35 10*3/uL — ABNORMAL HIGH (ref 0.00–0.07)
Basophils Absolute: 0.1 10*3/uL (ref 0.0–0.1)
Basophils Relative: 0 %
Eosinophils Absolute: 0 10*3/uL (ref 0.0–0.5)
Eosinophils Relative: 0 %
HCT: 31.4 % — ABNORMAL LOW (ref 39.0–52.0)
Hemoglobin: 10.4 g/dL — ABNORMAL LOW (ref 13.0–17.0)
Immature Granulocytes: 2 %
Lymphocytes Relative: 4 %
Lymphs Abs: 0.5 10*3/uL — ABNORMAL LOW (ref 0.7–4.0)
MCH: 32.1 pg (ref 26.0–34.0)
MCHC: 33.1 g/dL (ref 30.0–36.0)
MCV: 96.9 fL (ref 80.0–100.0)
Monocytes Absolute: 0.9 10*3/uL (ref 0.1–1.0)
Monocytes Relative: 6 %
Neutro Abs: 12.7 10*3/uL — ABNORMAL HIGH (ref 1.7–7.7)
Neutrophils Relative %: 88 %
Platelets: 651 10*3/uL — ABNORMAL HIGH (ref 150–400)
RBC: 3.24 MIL/uL — ABNORMAL LOW (ref 4.22–5.81)
RDW: 12.5 % (ref 11.5–15.5)
WBC: 14.5 10*3/uL — ABNORMAL HIGH (ref 4.0–10.5)
nRBC: 0.2 % (ref 0.0–0.2)

## 2020-05-29 MED ORDER — CARVEDILOL 3.125 MG PO TABS
6.2500 mg | ORAL_TABLET | Freq: Two times a day (BID) | ORAL | Status: DC
  Administered 2020-05-29 – 2020-06-03 (×11): 6.25 mg via ORAL
  Filled 2020-05-29 (×11): qty 2

## 2020-05-29 MED ORDER — DOXYCYCLINE HYCLATE 100 MG PO TABS
100.0000 mg | ORAL_TABLET | Freq: Two times a day (BID) | ORAL | Status: AC
  Administered 2020-05-29 – 2020-05-31 (×6): 100 mg via ORAL
  Filled 2020-05-29 (×9): qty 1

## 2020-05-29 MED ORDER — FUROSEMIDE 20 MG PO TABS
20.0000 mg | ORAL_TABLET | Freq: Every day | ORAL | Status: DC
  Administered 2020-05-30 – 2020-06-03 (×5): 20 mg via ORAL
  Filled 2020-05-29 (×5): qty 1

## 2020-05-29 MED ORDER — FUROSEMIDE 10 MG/ML IJ SOLN
30.0000 mg | Freq: Once | INTRAMUSCULAR | Status: AC
  Administered 2020-05-29: 30 mg via INTRAVENOUS
  Filled 2020-05-29: qty 4

## 2020-05-29 NOTE — Progress Notes (Signed)
NAME:  Max Cole, MRN:  [id number], DOB:  [birth year], LOS: 7 ADMISSION DATE:  05/22/2020, CONSULTATION DATE:  05/25/20  REFERRING MD:  Tat/ Triad, CHIEF COMPLAINT:  Sob/ resp failure with GG changes   Brief History   89 yowm remote smoker fully vaccinated maint on amiodarone since aug 2021  with several weeks to maybe a month of increasing fatgiue/sob then slt feverish  1 week PTA with neg COVID testing but  with GG changes on Chest CT and deteriorating sats on high flow 02 so PCCM consulted am 11/29 with last dose of amiodarone 11/28.       Past Medical History  Systolic CHF status postBiV PPM, CKD stage III, carotid stenosis, hyperlipidemia,PAF on apixabanand hypertension  Significant Hospital Events   Last dose of amiodarone 11/28 Solumedrol started 11/29 am at 80 q12 h and reduced 12/2 to 40 q 12h   Consults:  PCCM   Procedures:     Significant Diagnostic Tests:  ESR 11/29  = 18  (pre steroid rx)    Micro Data:  BC x 2 11/26  Neg Resp PCR panel 11/26  neg  Resp PCR panel 11/27 neg Urinary strep  11/27  Neg  Urine legionella 11/27  Neg  Resp PCR panel  11/128  neg     Antimicrobials:   Zmax 11/26 >>> 11/30 Rocephin 11/27 >>> 12/2 Doxy 12/3 >>>   Scheduled Meds: . apixaban  2.5 mg Oral BID  . carvedilol  6.25 mg Oral BID WC  . doxycycline  100 mg Oral Q12H  . ferrous sulfate  325 mg Oral Q breakfast  . furosemide  30 mg Intravenous Once  . [START ON 05/30/2020] furosemide  20 mg Oral Daily  . guaiFENesin  600 mg Oral BID  . ipratropium-albuterol  3 mL Nebulization BID  . methocarbamol  500 mg Oral TID  . methylPREDNISolone (SOLU-MEDROL) injection  40 mg Intravenous Q12H  . polyethylene glycol  17 g Oral Daily  . polyvinyl alcohol  1 drop Both Eyes TID  . pravastatin  20 mg Oral QPM  . senna  2 tablet Oral Daily  . sodium chloride flush  3 mL Intravenous Q12H  . sodium chloride flush  3 mL Intravenous Q12H  . tamsulosin  0.4 mg Oral Daily    Continuous Infusions: . sodium chloride     PRN Meds:.sodium chloride, acetaminophen **OR** acetaminophen, albuterol, bisacodyl, ondansetron **OR** ondansetron (ZOFRAN) IV, polyethylene glycol, polyvinyl alcohol, sodium chloride, sodium chloride flush, traZODone  Interim history/subjective:  Feels some better "x when he gets up and moves"   Objective   Blood pressure (!) 171/82, pulse 80, temperature 98.1 F (36.7 C), temperature source Oral, resp. rate 16, height _0  (1.854 m), weight 83.6 kg, SpO2 95 %.        Intake/Output Summary (Last 24 hours) at 05/29/2020 1429 Last data filed at 05/29/2020 1359 Gross per 24 hour  Intake 240 ml  Output 1400 ml  Net -1160 ml   Filed Weights   05/25/20 2100 05/27/20 0600 05/29/20 0500  Weight: 80.5 kg 83.5 kg 83.6 kg    Examination: Tmax  99.5  sats 99% on 10 lpm Nasal High flow 02 Pt alert, approp nad @ 60 degrees  No jvd Oropharynx clear,  mucosa nl Neck supple Lungs with minimal insp crackles bilaterally IR IR  no s3 or or sign murmur Abd soft with nl excursion  Extr warm with no edema or clubbing noted Neuro  Sensorium intact ,  no apparent motor deficits       I personally reviewed images and agree with radiology impression as follows:  CXR:   05/28/20 Unchanged bilateral lung opacities which could reflect infection or edema, potentially superimposed on chronic interstitial lung disease. Resolved Hospital Problem list      Assessment & Plan:  1)  Subacute respiratory illness with low grade fever/ GG changes on CT and worsening gas exchange while on adequate coverage for CAP and amiodarone for afib    Miscellaneous:Alv microlithiasis, alv proteinosis, asp, bronchiectais, BOOP less likely with esr only 18   ARDS/ AIP Occupational dz/ HSP Neoplasm Infection Note very low PCT on admit/ covid PCR neg x 3 so doubt  Drug   Amiodarone leading suspect - d/c'd  11/28 last dose Solumedrol started 11/29 am at 80 q12 h and  reduced 12/2 to 40 q 12h   Pulmonary emboli, Protein disorders Edema - bnp not that impressive but hard to exclude  > continue neg I/O as tol  Eosinophilic dz - note Eos 0.0 so less likely  Sarcoidosis Connective tissue dz > ANA neg, esr nl   Hist X / Hemorrhage - hgb down but no hemoptysis so less likely  Idiopathic      Labs   CBC: Recent Labs  Lab 05/24/20 0653 05/25/20 0706 05/27/20 0602 05/28/20 0528 05/29/20 0449  WBC 12.0* 12.0* 19.9* 16.2* 14.5*  NEUTROABS 9.7* 9.4* 18.2* 14.7* 12.7*  HGB 10.2* 10.2* 9.6* 10.0* 10.4*  HCT 31.6* 30.6* 28.5* 29.9* 31.4*  MCV 100.0 97.1 96.0 97.1 96.9  PLT 402* 480* 572* 574* 651*    Basic Metabolic Panel: Recent Labs  Lab 05/24/20 0653 05/24/20 0653 05/25/20 0706 05/26/20 1050 05/27/20 0602 05/28/20 0528 05/29/20 0449  NA 131*   < > 130* 131* 132* 134* 135  K 3.9   < > 3.9 3.4* 3.5 3.9 4.1  CL 97*   < > 95* 94* 97* 97* 98  CO2 26   < > _0 GLUCOSE 95   < > 103* 226* 129* 128* 119*  BUN 25*   < > 19 32* 37* 41* 38*  CREATININE 1.47*   < > 1.36* 1.55* 1.37* 1.52* 1.51*  CALCIUM 8.1*   < > 8.2* 8.4* 8.1* 8.2* 8.2*  MG 1.8  --   --  1.9  --  2.2 2.3   < > = values in this interval not displayed.   GFR: Estimated Creatinine Clearance: 36.7 mL/min (A) (by C-G formula based on SCr of 1.51 mg/dL (H)). Recent Labs  Lab 05/23/20 0729 05/23/20 1137 05/24/20 0653 05/24/20 0653 05/25/20 0706 05/27/20 0602 05/28/20 0528 05/29/20 0449  PROCALCITON 0.13  --  0.23  --  0.43  --   --   --   WBC 9.3   < > 12.0*   < > 12.0* 19.9* 16.2* 14.5*   < > = values in this interval not displayed.    Liver Function Tests: Recent Labs  Lab 05/23/20 0729 05/24/20 0942 05/25/20 0706 05/28/20 0528 05/29/20 0449  AST _1 ALT _2 ALKPHOS 55 58 62 62 63  BILITOT 0.5 0.5 0.7 0.5 0.7  PROT 5.4* 5.9* 6.0* 5.5* 5.6*  ALBUMIN 2.1* 2.3* 2.1* 2.1* 2.2*   No results for input(s): LIPASE, AMYLASE in the  last 168 hours. No results for input(s): AMMONIA in the last 168 hours.  ABG    Component Value  Date/Time   PHART 7.436 05/25/2020 0902   PCO2ART 37.9 05/25/2020 0902   PO2ART 68.2 (L) 05/25/2020 0902   HCO3 25.6 05/25/2020 0902   O2SAT 93.3 05/25/2020 0902     Coagulation Profile: No results for input(s): INR, PROTIME in the last 168 hours.  Cardiac Enzymes: No results for input(s): CKTOTAL, CKMB, CKMBINDEX, TROPONINI in the last 168 hours.  HbA1C: No results found for: HGBA1C  CBG: No results for input(s): GLUCAP in the last 168 hours.   Discussed with Daughter Santiago Glad CRNA    Agree with trying to taper solumedrol as given the esr of 18 I'm afraid this is the PF version of amiodarone toxicity more so that the acute inflammatory pattern    Christinia Gully, MD Pulmonary [facility] [place] [phone]   After 7:00 pm call Elink  [phone]

## 2020-05-29 NOTE — Progress Notes (Signed)
PROGRESS NOTE  KAZUTO SEVEY WPT:[id number] DOB: 11/16/1927 DOA: 05/22/2020 PCP: Asencion Noble, MD  Brief History: 86 y.o.malewith medical history ofsystolic CHF status postBiV PPM, CKD stage III, carotid stenosis, hyperlipidemia,PAF on apixabanand hypertension presentingapproximately 10-day history of generalized weakness, dyspnea on exertion, fevers and chills, and shortness of breath. Apparently, the patient stated that he saw his primary care provider approximately 2 to 3 weeks prior to this admission. He was placed on a prednisone taper. There is no improvement of his symptoms. He took some over-the-counter NyQuil with no improvement. He called his PCP again, the patient was placed on empiric dose of Tamiflu. He continued to have malaise and shortness of breath. As result, he presented for further evaluation. He denied any nausea, vomiting, diarrhea, chest pain, hemoptysis, abdominal pain, dysuria, hematuria, hematochezia, melena. The patient has not started any other new medications except for Robaxin and prednisone. He denies any worsening lower extremity edema, orthopnea, chest pain, headache, sore throat.He has one cat, no exotic pets. No travels. In the emergency department, the patient was febrile up to 100.73F. He was hemodynamically stable with oxygen saturation down to 80% on room air. He was placed on 4 L nasal cannula with saturation 93-94%. CTA chest was negative for PE but showed bilateral widespread groundglass opacities there was cardiomegaly with enlarged central pulmonary arteries. There is a chronic T12 endplate fracture which was stable compared to 2017.  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  Family wants to try for a couple more days to see if we can achieve any further clinical improvements, if not, they would be agreeable to comfort care measures.  Goal is for patient to go home.  If he has no meaningful improvement then would not want to continue full scope care.    Atypical pneumonia/lobar pneumonia -Urine Legionella antigen--neg -Urine Streptococcus pneumonia antigen--neg -Viral respiratory panel--neg -Continue azithromycin and ceftriaxone -Lactic acid 1.3  FUO -due to amiodarone toxicity--last dose 11/29 -now afebrile -UA neg for pyuria -CT chest as discussed above -blood cultures remain neg -CT abd/pelvis--sm bilateral pleural eff; unchanged GGO; mild fat stranding around sigmoid colon; ur bladder wall thickening  Paroxysmal atrial fibrillation -CHADSVASc = 5 -continue apixaban -rate has been controlled  -Discontinued amiodarone due to pulmonary toxicity, add to allergies -continue coreg 6.25 mg BID   CKD stage IIIb -Baseline creatinine 1.5-1.7, following   Chronic systolic and diastolic CHF -Clinically euvolemic -05/21/2015 echo EF 20 to 25%, grade 1 DD -02/07/2020 echo EF 50-55%, no WMA, grade 1 DD, mild AR -some suggestion of pulmonary edema on CXR, I have added lasix to see if we can improve pulmonary status with diuresis  Left carotid stenosis -Continue apixaban  Hyperlipidemia -Continue statin  BPH -Continue tamsulosin  Status is: Inpatient  Remains inpatient appropriate because:IV treatments appropriate due to intensity of illness or  inability to take PO  Dispo: The patient is from:Home Anticipated d/c is IH:DTPN Anticipated d/c date is: 3 days Patient currently is not  medically stable to d/c.  He remains on 10L/min supplemental oxygen.   Family Communication:Daughter updatedat bedside 12/1, son 12/2, daughter by phone 12/3  Consultants:none  Code Status: FULL  DVT Prophylaxis:apixaban   Procedures: As Listed in Progress Note Above  Antibiotics: Ceftriaxone 11/27>> azithro11/26>>  Total time spent 35 minutes.  Greater than 50% spent face to face counseling and coordinating care.   Subjective: Patient not sleeping well.    Objective: Vitals:   05/28/20 2116 05/29/20 0407 05/29/20 0500 05/29/20 0831  BP: (!) 180/89 (!) 155/111    Pulse: 79 78    Resp: 19 17    Temp: 99.5 F (37.5 C) 97.7 F (36.5 C)    TempSrc:      SpO2: 95% 90%  96%  Weight:   83.6 kg   Height:        Intake/Output Summary (Last 24 hours) at 05/29/2020 1144 Last data filed at 05/29/2020 1130 Gross per 24 hour  Intake 720 ml  Output 1200 ml  Net -480 ml   Weight change:  Exam:   General:  Awake, alert, NAD, cooperative.   HEENT: No icterus, No thrush, No neck mass, [place]/AT  Cardiovascular: normal S1/S2, no rubs, no gallops  Respiratory: BBS with rare bibasilar crackles heard.   Abdomen: Soft/+BS, non tender, non distended, no guarding  Extremities: No edema, No lymphangitis, No petechiae, No rashes, no synovitis  Data Reviewed: I have personally reviewed following labs and imaging studies Basic Metabolic Panel: Recent Labs  Lab 05/24/20 0653 05/24/20 0653 05/25/20 0706 05/26/20 1050 05/27/20 0602 05/28/20 0528 05/29/20 0449  NA 131*   < > 130* 131* 132* 134* 135  K 3.9   < > 3.9 3.4* 3.5 3.9 4.1  CL 97*   < > 95* 94* 97* 97* 98  CO2 26   < > _0 GLUCOSE 95   < > 103* 226* 129* 128* 119*  BUN 25*   < > 19 32* 37* 41* 38*   CREATININE 1.47*   < > 1.36* 1.55* 1.37* 1.52* 1.51*  CALCIUM 8.1*   < > 8.2* 8.4* 8.1* 8.2* 8.2*  MG 1.8  --   --  1.9  --  2.2 2.3   < > = values in this interval not displayed.   Liver Function Tests: Recent Labs  Lab 05/23/20 0729 05/24/20 0942 05/25/20 0706 05/28/20 0528 05/29/20 0449  AST _1 ALT _2 ALKPHOS 55 58 62 62 63  BILITOT 0.5 0.5 0.7 0.5 0.7  PROT 5.4* 5.9* 6.0* 5.5* 5.6*  ALBUMIN 2.1* 2.3* 2.1* 2.1* 2.2*   No results for input(s): LIPASE, AMYLASE in the last 168 hours. No results for input(s): AMMONIA in the last 168 hours. Coagulation Profile: No results for input(s): INR, PROTIME in the last 168 hours. CBC: Recent Labs  Lab 05/24/20 0653 05/25/20 0706 05/27/20 0602 05/28/20 0528 05/29/20 0449  WBC 12.0* 12.0* 19.9* 16.2* 14.5*  NEUTROABS 9.7* 9.4* 18.2* 14.7* 12.7*  HGB 10.2* 10.2* 9.6* 10.0* 10.4*  HCT 31.6* 30.6* 28.5* 29.9* 31.4*  MCV 100.0 97.1 96.0 97.1 96.9  PLT 402* 480* 572* 574* 651*   Cardiac Enzymes: No results for input(s): CKTOTAL, CKMB, CKMBINDEX, TROPONINI in the last 168 hours. BNP: Invalid input(s): POCBNP CBG: No results for input(s): GLUCAP in the last 168 hours. HbA1C: No results for input(s): HGBA1C in the last 72 hours. Urine analysis:    Component Value  Date/Time   COLORURINE YELLOW 05/22/2020 [place] 05/22/2020 1041   LABSPEC 1.015 05/22/2020 1041   PHURINE 6.0 05/22/2020 [place] 05/22/2020 1041   GLUCOSEU neg 02/26/2009 0000   HGBUR NEGATIVE 05/22/2020 [place] 05/22/2020 [place] 05/22/2020 1041   PROTEINUR 30 (A) 05/22/2020 1041   NITRITE NEGATIVE 05/22/2020 1041   LEUKOCYTESUR NEGATIVE 05/22/2020 1041    Recent Results (from the past 240 hour(s))  Resp Panel by RT-PCR (Flu A&B, Covid) Nasopharyngeal Swab     Status: None   Collection Time: 05/22/20  9:57 AM   Specimen: Nasopharyngeal Swab; Nasopharyngeal(NP)  swabs in vial transport medium  Result Value Ref Range Status   SARS Coronavirus 2 by RT PCR NEGATIVE NEGATIVE Final    Comment: (NOTE) SARS-CoV-2 target nucleic acids are NOT DETECTED.  The SARS-CoV-2 RNA is generally detectable in upper respiratory specimens during the acute phase of infection. The lowest concentration of SARS-CoV-2 viral copies this assay can detect is 138 copies/mL. A negative result does not preclude SARS-Cov-2 infection and should not be used as the sole basis for treatment or other patient management decisions. A negative result may occur with  improper specimen collection/handling, submission of specimen other than nasopharyngeal swab, presence of viral mutation(s) within the areas targeted by this assay, and inadequate number of viral copies(<138 copies/mL). A negative result must be combined with clinical observations, patient history, and epidemiological information. The expected result is Negative.  Fact Sheet for Patients:  EntrepreneurPulse.com.au  Fact Sheet for Healthcare Providers:  IncredibleEmployment.be  This test is no t yet approved or cleared by the [place] FDA and  has been authorized for detection and/or diagnosis of SARS-CoV-2 by FDA under an Emergency Use Authorization (EUA). This EUA will remain  in effect (meaning this test can be used) for the duration of the COVID-19 declaration under Section 564(b)(1) of the Act, 21 U.S.C.section 360bbb-3(b)(1), unless the authorization is terminated  or revoked sooner.       Influenza A by PCR NEGATIVE NEGATIVE Final   Influenza B by PCR NEGATIVE NEGATIVE Final    Comment: (NOTE) The Xpert Xpress SARS-CoV-2/FLU/RSV plus assay is intended as an aid in the diagnosis of influenza from Nasopharyngeal swab specimens and should not be used as a sole basis for treatment. Nasal washings and aspirates are unacceptable for Xpert Xpress  SARS-CoV-2/FLU/RSV testing.  Fact Sheet for Patients: EntrepreneurPulse.com.au  Fact Sheet for Healthcare Providers: IncredibleEmployment.be  This test is not yet approved or cleared by the [place] FDA and has been authorized for detection and/or diagnosis of SARS-CoV-2 by FDA under an Emergency Use Authorization (EUA). This EUA will remain in effect (meaning this test can be used) for the duration of the COVID-19 declaration under Section 564(b)(1) of the Act, 21 U.S.C. section 360bbb-3(b)(1), unless the authorization is terminated or revoked.  Performed at [facility], [address], [place], [place] [address]   Blood culture (routine x 2)     Status: None   Collection Time: 05/22/20 10:45 AM   Specimen: BLOOD LEFT HAND  Result Value Ref Range Status   Specimen Description   Final    BLOOD LEFT HAND BOTTLES DRAWN AEROBIC AND ANAEROBIC   Special Requests   Final    Blood Culture results may not be optimal due to an inadequate volume of blood received in culture bottles   Culture   Final    NO GROWTH 6 DAYS Performed  at [facility], [address] [place], [place] [address]    Report Status 05/28/2020 FINAL  Final  Blood culture (routine x 2)     Status: None   Collection Time: 05/22/20 10:52 AM   Specimen: Right Antecubital; Blood  Result Value Ref Range Status   Specimen Description   Final    RIGHT ANTECUBITAL BOTTLES DRAWN AEROBIC AND ANAEROBIC   Special Requests Blood Culture adequate volume  Final   Culture   Final    NO GROWTH 6 DAYS Performed at [facility], [address], [place], [place] [address]    Report Status 05/28/2020 FINAL  Final  Resp Panel by RT-PCR (Flu A&B, Covid)     Status: None   Collection Time: 05/23/20  6:40 PM  Result Value Ref Range Status   SARS Coronavirus 2 by RT PCR NEGATIVE NEGATIVE Final    Comment: (NOTE) SARS-CoV-2 target nucleic acids are NOT DETECTED.  The SARS-CoV-2 RNA is generally  detectable in upper respiratory specimens during the acute phase of infection. The lowest concentration of SARS-CoV-2 viral copies this assay can detect is 138 copies/mL. A negative result does not preclude SARS-Cov-2 infection and should not be used as the sole basis for treatment or other patient management decisions. A negative result may occur with  improper specimen collection/handling, submission of specimen other than nasopharyngeal swab, presence of viral mutation(s) within the areas targeted by this assay, and inadequate number of viral copies(<138 copies/mL). A negative result must be combined with clinical observations, patient history, and epidemiological information. The expected result is Negative.  Fact Sheet for Patients:  BloggerCourse.com  Fact Sheet for Healthcare Providers:  SeriousBroker.it  This test is no t yet approved or cleared by the [place] FDA and  has been authorized for detection and/or diagnosis of SARS-CoV-2 by FDA under an Emergency Use Authorization (EUA). This EUA will remain  in effect (meaning this test can be used) for the duration of the COVID-19 declaration under Section 564(b)(1) of the Act, 21 U.S.C.section 360bbb-3(b)(1), unless the authorization is terminated  or revoked sooner.       Influenza A by PCR NEGATIVE NEGATIVE Final   Influenza B by PCR NEGATIVE NEGATIVE Final    Comment: (NOTE) The Xpert Xpress SARS-CoV-2/FLU/RSV plus assay is intended as an aid in the diagnosis of influenza from Nasopharyngeal swab specimens and should not be used as a sole basis for treatment. Nasal washings and aspirates are unacceptable for Xpert Xpress SARS-CoV-2/FLU/RSV testing.  Fact Sheet for Patients: BloggerCourse.com  Fact Sheet for Healthcare Providers: SeriousBroker.it  This test is not yet approved or cleared by the [place] FDA  and has been authorized for detection and/or diagnosis of SARS-CoV-2 by FDA under an Emergency Use Authorization (EUA). This EUA will remain in effect (meaning this test can be used) for the duration of the COVID-19 declaration under Section 564(b)(1) of the Act, 21 U.S.C. section 360bbb-3(b)(1), unless the authorization is terminated or revoked.  Performed at [facility], [address], [place], [place] [address]   Respiratory Panel by PCR     Status: None   Collection Time: 05/24/20 10:04 AM   Specimen: Nasopharyngeal Swab; Respiratory  Result Value Ref Range Status   Adenovirus NOT DETECTED NOT DETECTED Final   Coronavirus 229E NOT DETECTED NOT DETECTED Final    Comment: (NOTE) The Coronavirus on the Respiratory Panel, DOES NOT test for the novel  Coronavirus (2019 nCoV)    Coronavirus HKU1 NOT DETECTED NOT DETECTED Final  Coronavirus NL63 NOT DETECTED NOT DETECTED Final   Coronavirus OC43 NOT DETECTED NOT DETECTED Final   Metapneumovirus NOT DETECTED NOT DETECTED Final   Rhinovirus / Enterovirus NOT DETECTED NOT DETECTED Final   Influenza A NOT DETECTED NOT DETECTED Final   Influenza B NOT DETECTED NOT DETECTED Final   Parainfluenza Virus 1 NOT DETECTED NOT DETECTED Final   Parainfluenza Virus 2 NOT DETECTED NOT DETECTED Final   Parainfluenza Virus 3 NOT DETECTED NOT DETECTED Final   Parainfluenza Virus 4 NOT DETECTED NOT DETECTED Final   Respiratory Syncytial Virus NOT DETECTED NOT DETECTED Final   Bordetella pertussis NOT DETECTED NOT DETECTED Final   Chlamydophila pneumoniae NOT DETECTED NOT DETECTED Final   Mycoplasma pneumoniae NOT DETECTED NOT DETECTED Final    Comment: Performed at [facility], [place] [address], [place], [place] [address]     Scheduled Meds: . apixaban  2.5 mg Oral BID  . carvedilol  6.25 mg Oral BID WC  . doxycycline  100 mg Oral Q12H  . ferrous sulfate  325 mg Oral Q breakfast  . guaiFENesin  600 mg Oral BID  . ipratropium-albuterol  3  mL Nebulization BID  . methocarbamol  500 mg Oral TID  . methylPREDNISolone (SOLU-MEDROL) injection  40 mg Intravenous Q12H  . polyethylene glycol  17 g Oral Daily  . polyvinyl alcohol  1 drop Both Eyes TID  . pravastatin  20 mg Oral QPM  . senna  2 tablet Oral Daily  . sodium chloride flush  3 mL Intravenous Q12H  . sodium chloride flush  3 mL Intravenous Q12H  . tamsulosin  0.4 mg Oral Daily   Continuous Infusions: . sodium chloride      Procedures/Studies: CT ABDOMEN PELVIS WO CONTRAST  Result Date: 05/24/2020 CLINICAL DATA:  Generalized weakness and fever of unknown origin. EXAM: CT ABDOMEN AND PELVIS WITHOUT CONTRAST TECHNIQUE: Multidetector CT imaging of the abdomen and pelvis was performed following the standard protocol without IV contrast. COMPARISON:  Chest CT dated 05/22/2020 and renal ultrasound dated 06/02/2009. FINDINGS: Lower chest: Cardiomegaly is partially imaged. There are small bilateral pleural effusions which appear similar to prior exam. Diffuse bilateral ground-glass opacities appear unchanged from prior exam. Hepatobiliary: No focal liver abnormality is seen. No gallstones, gallbladder wall thickening, or biliary dilatation. Pancreas: Unremarkable. No pancreatic ductal dilatation or surrounding inflammatory changes. Spleen: Normal in size without focal abnormality. Adrenals/Urinary Tract: Adrenal glands are unremarkable. Bilateral renal cysts are noted, measuring up to 2.1 cm on the right and 1.8 cm on the left. Kidneys are normal, without renal calculi, focal lesion, or hydronephrosis. Multiple urinary bladder diverticula are noted and there is associated urinary bladder wall thickening, likely reflecting bladder outlet obstruction given the patient's large prostate. Stomach/Bowel: Stomach is within normal limits. Colonic diverticula are noted, particularly in the sigmoid colon. There is a mild fat stranding surrounding the proximal sigmoid colon (series 5 images 42-49)  which may reflect acute diverticulitis. No evidence of abscess or fistula formation. No pericecal inflammatory changes are noted to suggest acute appendicitis. No evidence of bowel obstruction. Vascular/Lymphatic: Aortic atherosclerosis. No enlarged abdominal or pelvic lymph nodes. Reproductive: The prostate is enlarged, measuring 6.5 cm in transverse dimension, and indents the bladder. Other: No abdominal wall hernia or abnormality. No abdominopelvic ascites. Musculoskeletal: Chronic appearing wedge deformities are seen at T12 and L4 with less than 25% height loss. Insert degenerative IMPRESSION: 1. Mild fat stranding surrounding the proximal sigmoid colon may reflect acute diverticulitis. No evidence of  complication. 2. Multiple urinary bladder diverticula with associated urinary bladder wall thickening, likely reflecting bladder outlet obstruction given the patient's large prostate. 3. Small bilateral pleural effusions with diffuse bilateral ground-glass opacities appear unchanged from prior exam. Aortic Atherosclerosis (ICD10-I70.0). Electronically Signed   By: Zerita Boers M.D.   On: 05/24/2020 16:46   CT Angio Chest PE W and/or Wo Contrast  Result Date: 05/22/2020 CLINICAL DATA:  86 year old male with chest pain. Weakness. Shortness of breath. Abnormal D-dimer. EXAM: CT ANGIOGRAPHY CHEST WITH CONTRAST TECHNIQUE: Multidetector CT imaging of the chest was performed using the standard protocol during bolus administration of intravenous contrast. Multiplanar CT image reconstructions and MIPs were obtained to evaluate the vascular anatomy. CONTRAST:  61mL OMNIPAQUE IOHEXOL 350 MG/ML SOLN COMPARISON:  Portable chest earlier today. Chest radiographs 07/09/2015. Neck CT, CTA 07/03/2017 and earlier. FINDINGS: Cardiovascular: Good contrast bolus timing in the pulmonary arterial tree. No focal filling defect identified in the pulmonary arteries to suggest acute pulmonary embolism. Mild to moderate enlargement of  central pulmonary arteries relative to the aorta (series 3, image 147). Calcified aortic atherosclerosis. Calcified coronary artery atherosclerosis. Cardiomegaly. Left chest cardiac pacemaker type device. No pericardial effusion. Mediastinum/Nodes: Negative.  No lymphadenopathy. Lungs/Pleura: Major airways are patent. Widespread although fairly symmetric peripheral, peribronchial, and also in the lung apices this is new or progressed since 2019. No consolidation. Trace layering pleural fluid greater on the left. And dependent pulmonary ground-glass opacity in both lungs Upper Abdomen: Negative for age visible upper abdominal viscera Musculoskeletal: Chronic T12 superior endplate compression suspected to be stable since 2017 radiographs. No acute osseous abnormality identified. Review of the MIP images confirms the above findings. IMPRESSION: 1. Negative for pulmonary embolus. 2. Widespread abnormal although nonspecific pulmonary ground-glass opacity with trace layering pleural fluid. Acute viral/atypical respiratory infection not excluded. Chronic interstitial lung disease is also a consideration. 3. Cardiomegaly with mild to moderate enlargement of the central pulmonary arteries raising the possibility of pulmonary artery hypertension. Aortic Atherosclerosis (ICD10-I70.0). Electronically Signed   By: Genevie Ann M.D.   On: 05/22/2020 14:38   DG CHEST PORT 1 VIEW  Result Date: 05/29/2020 CLINICAL DATA:  Acute on chronic respiratory failure with hypoxia. EXAM: PORTABLE CHEST 1 VIEW COMPARISON:  05/25/2020 FINDINGS: The cardiac silhouette remains enlarged, and a 3 lead pacemaker remains in place. Mixed interstitial and patchy airspace opacities bilaterally have not significantly changed. No sizable pleural effusion or pneumothorax is identified. IMPRESSION: Unchanged bilateral lung opacities which could reflect infection or edema, potentially superimposed on chronic interstitial lung disease. Electronically Signed    By: Logan Bores M.D.   On: 05/29/2020 10:26   DG CHEST PORT 1 VIEW  Result Date: 05/25/2020 CLINICAL DATA:  Shortness of breath EXAM: PORTABLE CHEST 1 VIEW COMPARISON:  05/22/2020 FINDINGS: Likely chronic interstitial lung changes. Patchy superimposed increased density. No significant pleural effusion. No pneumothorax. Stable cardiomediastinal contours. Left chest wall pacemaker. IMPRESSION: Patchy increased density superimposed on likely chronic interstitial lung disease. This may reflect superimposed pneumonia or edema. Electronically Signed   By: Macy Mis M.D.   On: 05/25/2020 08:27   DG Chest Port 1 View  Result Date: 05/22/2020 CLINICAL DATA:  Weakness and shortness of breath. EXAM: PORTABLE CHEST 1 VIEW COMPARISON:  Chest x-ray 07/09/2015. FINDINGS: AICD in stable position. Cardiomegaly with mild bilateral interstitial prominence suggesting interstitial edema. Pneumonitis cannot be excluded. No pleural effusion or pneumothorax. IMPRESSION: AICD in stable position. Cardiomegaly with mild bilateral interstitial prominence suggesting interstitial edema. Pneumonitis cannot be excluded. Electronically  Signed   ByMarcello Moores  Register   On: 05/22/2020 11:10   CUP PACEART REMOTE DEVICE CHECK  Result Date: 05/06/2020 Scheduled remote reviewed. Normal device function.  1 SVT logged 7 minutes 12 seconds w/ rates 160's bpm 1 ATR - same event as above 10 PMT successfully terminated by PMT algorithm Next remote 91 days.  Irwin Brakeman, MD  [facility]  If 7PM-7AM, please contact night-coverage [email or web address] Password [email or web address] 05/29/2020, 11:44 AM   LOS: 7 days

## 2020-05-29 NOTE — Plan of Care (Signed)
  Problem: Education: Goal: Knowledge of General Education information will improve Description Including pain rating scale, medication(s)/side effects and non-pharmacologic comfort measures Outcome: Progressing   Problem: Health Behavior/Discharge Planning: Goal: Ability to manage health-related needs will improve Outcome: Progressing   

## 2020-05-29 NOTE — Progress Notes (Signed)
Physical Therapy Treatment Patient Details Name: Max Cole MRN: [id number] DOB: 02-Jul-1927 Today's Date: 05/29/2020    History of Present Illness 86 y.o. male with medical history of systolic CHF status post BiV PPM, CKD stage III, carotid stenosis, hyperlipidemia, PAF on apixaban and hypertension presenting approximately 10-day history of generalized weakness, dyspnea on exertion, fevers and chills, and shortness of breath.  Apparently, the patient stated that he saw his primary care provider approximately 2 to 3 weeks prior to this admission.  He was placed on a prednisone taper.  There is no improvement of his symptoms.  He took some over-the-counter NyQuil with no improvement.  He called his PCP again, the patient was placed on empiric dose of Tamiflu.  He continued to have malaise and shortness of breath.  As result, he presented for further evaluation.  He denied any nausea, vomiting, diarrhea, chest pain, hemoptysis, abdominal pain, dysuria, hematuria, hematochezia, melena.  The patient has not started any other new medications except for Robaxin and prednisone.  He denies any worsening lower extremity edema, orthopnea, chest pain, headache, sore throat.  He has one cat, no exotic pets.  No travels.In the emergency department, the patient was febrile up to 100.9F.  He was hemodynamically stable with oxygen saturation down to 80% on room air.  He was placed on 4 L nasal cannula with saturation 93-94%.  CTA chest was negative for PE but showed bilateral widespread groundglass opacities there was cardiomegaly with enlarged central pulmonary arteries.  There is a chronic T12 endplate fracture which was stable compared to 2017.    PT Comments    Patient O2 sat in mid to high 80s in supine with HOB elevated at beginning of session with 10 L HFNC. Patient transitions to seated EOB without phsyical assist but is given some cueing for sequencing and for breathing. Patient instructed on diaphragmatic  and pursed lip breathing by PT and daughters who are present for session. Patient O2 sat decrased to 65% while sitting EOB and increased to mid-80s after 10-15 minutes. Patient frequently reminded to focus on breathing and not talking as O2 sat continually decreased with speaking. Patient transfers to standing and takes several lateral steps at bedside with RW. Patient ended session back in bed. Discussed POC with patient and his daughters today, they are agreeable to SNF rehab depending on patient's progress while in hospital. Instructed patient's daughters to assist patient to seated EOB as able and monitor O2 sat. Patient will benefit from continued physical therapy in hospital and recommended venue below to increase strength, balance, endurance for safe ADLs and gait.    Follow Up Recommendations  SNF;Supervision for mobility/OOB;Supervision/Assistance - 24 hour     Equipment Recommendations  None recommended by PT    Recommendations for Other Services       Precautions / Restrictions Precautions Precautions: Fall Restrictions Weight Bearing Restrictions: No    Mobility  Bed Mobility Overal bed mobility: Needs Assistance Bed Mobility: Supine to Sit;Sit to Supine     Supine to sit: Min guard;Supervision Sit to supine: Supervision   General bed mobility comments: cueing for breathing throughout  Transfers Overall transfer level: Needs assistance Equipment used: Rolling walker (2 wheeled) Transfers: Sit to/from [facility] Sit to Stand: Min assist         General transfer comment: transfer to standing with RW, cueing for sequencing  Ambulation/Gait Ambulation/Gait assistance: Min guard Gait Distance (Feet): 2 Feet Assistive device: Rolling walker (2 wheeled)  General Gait Details: slow, lateral steps with use of RW at bedside   Stairs             Wheelchair Mobility    Modified Rankin (Stroke Patients Only)       Balance Overall  balance assessment: Needs assistance Sitting-balance support: Bilateral upper extremity supported;Feet supported Sitting balance-Leahy Scale: Good Sitting balance - Comments: seated eob   Standing balance support: During functional activity;Bilateral upper extremity supported Standing balance-Leahy Scale: Good Standing balance comment: good/fair with RW                            Cognition Arousal/Alertness: Awake/alert Behavior During Therapy: WFL for tasks assessed/performed Overall Cognitive Status: Within Functional Limits for tasks assessed                                        Exercises      General Comments        Pertinent Vitals/Pain Pain Assessment: No/denies pain    Home Living                      Prior Function            PT Goals (current goals can now be found in the care plan section) Acute Rehab PT Goals Patient Stated Goal: Go to rehab prior to going home as 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 PT Goal Formulation: With patient/family Time For Goal Achievement: 06/08/20 Potential to Achieve Goals: Fair Progress towards PT goals: Progressing toward goals    Frequency    Min 3X/week      PT Plan Discharge plan needs to be updated;Current plan remains appropriate    Co-evaluation              AM-PAC PT "6 Clicks" Mobility   Outcome Measure  Help needed turning from your back to your side while in a flat bed without using bedrails?: A Little Help needed moving from lying on your back to sitting on the side of a flat bed without using bedrails?: A Little Help needed moving to and from a bed to a chair (including a wheelchair)?: A Lot Help needed standing up from a chair using your arms (e.g., wheelchair or bedside chair)?: A Little Help needed to walk in hospital room?: A Lot Help needed climbing 3-5 steps with a railing? : A Lot 6 Click Score: 15    End of Session Equipment Utilized  During Treatment: Oxygen Activity Tolerance: Patient tolerated treatment well;Patient limited by fatigue Patient left: in bed;with family/visitor present;with call bell/phone within reach Nurse Communication: Mobility status PT Visit Diagnosis: Unsteadiness on feet (R26.81);Other abnormalities of gait and mobility (R26.89);Difficulty in walking, not elsewhere classified (R26.2)     Time: 2505-3976 PT Time Calculation (min) (ACUTE ONLY): 28 min  Charges:  $Therapeutic Activity: 23-37 mins                      10:59 AM, 05/29/20 Mearl Latin PT, DPT Physical Therapist at [facility]

## 2020-05-30 DIAGNOSIS — J181 Lobar pneumonia, unspecified organism: Secondary | ICD-10-CM | POA: Diagnosis not present

## 2020-05-30 DIAGNOSIS — J9621 Acute and chronic respiratory failure with hypoxia: Secondary | ICD-10-CM | POA: Diagnosis not present

## 2020-05-30 DIAGNOSIS — I4891 Unspecified atrial fibrillation: Secondary | ICD-10-CM | POA: Diagnosis not present

## 2020-05-30 LAB — COMPREHENSIVE METABOLIC PANEL
ALT: 25 U/L (ref 0–44)
AST: 21 U/L (ref 15–41)
Albumin: 2.3 g/dL — ABNORMAL LOW (ref 3.5–5.0)
Alkaline Phosphatase: 70 U/L (ref 38–126)
Anion gap: 9 (ref 5–15)
BUN: 41 mg/dL — ABNORMAL HIGH (ref 8–23)
CO2: 29 mmol/L (ref 22–32)
Calcium: 8.2 mg/dL — ABNORMAL LOW (ref 8.9–10.3)
Chloride: 97 mmol/L — ABNORMAL LOW (ref 98–111)
Creatinine, Ser: 1.54 mg/dL — ABNORMAL HIGH (ref 0.61–1.24)
GFR, Estimated: 43 mL/min — ABNORMAL LOW (ref >60)
Glucose, Bld: 120 mg/dL — ABNORMAL HIGH (ref 70–99)
Potassium: 4 mmol/L (ref 3.5–5.1)
Sodium: 135 mmol/L (ref 135–145)
Total Bilirubin: 0.5 mg/dL (ref 0.3–1.2)
Total Protein: 5.6 g/dL — ABNORMAL LOW (ref 6.5–8.1)

## 2020-05-30 LAB — CBC WITH DIFFERENTIAL/PLATELET
Abs Immature Granulocytes: 0.77 10*3/uL — ABNORMAL HIGH (ref 0.00–0.07)
Basophils Absolute: 0.1 10*3/uL (ref 0.0–0.1)
Basophils Relative: 1 %
Eosinophils Absolute: 0.2 10*3/uL (ref 0.0–0.5)
Eosinophils Relative: 1 %
HCT: 34.4 % — ABNORMAL LOW (ref 39.0–52.0)
Hemoglobin: 11.2 g/dL — ABNORMAL LOW (ref 13.0–17.0)
Immature Granulocytes: 5 %
Lymphocytes Relative: 4 %
Lymphs Abs: 0.7 10*3/uL (ref 0.7–4.0)
MCH: 31.7 pg (ref 26.0–34.0)
MCHC: 32.6 g/dL (ref 30.0–36.0)
MCV: 97.5 fL (ref 80.0–100.0)
Monocytes Absolute: 0.9 10*3/uL (ref 0.1–1.0)
Monocytes Relative: 5 %
Neutro Abs: 14.1 10*3/uL — ABNORMAL HIGH (ref 1.7–7.7)
Neutrophils Relative %: 84 %
Platelets: 685 10*3/uL — ABNORMAL HIGH (ref 150–400)
RBC: 3.53 MIL/uL — ABNORMAL LOW (ref 4.22–5.81)
RDW: 12.6 % (ref 11.5–15.5)
WBC: 16.8 10*3/uL — ABNORMAL HIGH (ref 4.0–10.5)
nRBC: 0 % (ref 0.0–0.2)

## 2020-05-30 LAB — MAGNESIUM: Magnesium: 2.3 mg/dL (ref 1.7–2.4)

## 2020-05-30 MED ORDER — CALCIUM CARBONATE ANTACID 500 MG PO CHEW
1.0000 | CHEWABLE_TABLET | Freq: Once | ORAL | Status: AC
  Administered 2020-05-30: 200 mg via ORAL
  Filled 2020-05-30: qty 1

## 2020-05-30 NOTE — Progress Notes (Signed)
PROGRESS NOTE  Max Cole NTI:[id number] DOB: [birth year] DOA: 05/22/2020 PCP: Asencion Noble, MD  Brief History: 86 y.o.malewith medical history ofsystolic CHF status postBiV PPM, CKD stage III, carotid stenosis, hyperlipidemia,PAF on apixabanand hypertension presentingapproximately 10-day history of generalized weakness, dyspnea on exertion, fevers and chills, and shortness of breath. Apparently, the patient stated that he saw his primary care provider approximately 2 to 3 weeks prior to this admission. He was placed on a prednisone taper. There is no improvement of his symptoms. He took some over-the-counter NyQuil with no improvement. He called his PCP again, the patient was placed on empiric dose of Tamiflu. He continued to have malaise and shortness of breath. As result, he presented for further evaluation. He denied any nausea, vomiting, diarrhea, chest pain, hemoptysis, abdominal pain, dysuria, hematuria, hematochezia, melena. The patient has not started any other new medications except for Robaxin and prednisone. He denies any worsening lower extremity edema, orthopnea, chest pain, headache, sore throat.He has one cat, no exotic pets. No travels. In the emergency department, the patient was febrile up to 100.47F. He was hemodynamically stable with oxygen saturation down to 80% on room air. He was placed on 4 L nasal cannula with saturation 93-94%. CTA chest was negative for PE but showed bilateral widespread groundglass opacities there was cardiomegaly with enlarged central pulmonary arteries. There is a chronic T12 endplate fracture which was stable compared to 2017.  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  Family wants to try for a couple more days to see if we can achieve any further clinical improvements, if not, they would be agreeable to comfort care measures.  The patient's priority goal is to go home.  If he has no meaningful improvement then would not want to continue full scope care.    Atypical pneumonia/lobar pneumonia -Urine Legionella antigen--neg -Urine Streptococcus pneumonia antigen--neg -Viral respiratory panel--neg -completing doxycycline 12/5.   FUO - RESOLVED -suspect was due to amiodarone toxicity--last dose 11/29 -now afebrile -UA neg for pyuria -CT chest as discussed above -blood cultures remain neg -CT abd/pelvis--sm bilateral pleural eff; unchanged GGO; mild fat stranding around sigmoid colon; ur bladder wall thickening  Paroxysmal atrial fibrillation -CHADSVASc = 5 -continue apixaban -rate has been controlled  -Discontinued amiodarone due to pulmonary toxicity, add to allergies -continue coreg 6.25 mg BID   CKD stage IIIb -Baseline creatinine 1.5-1.7, following   Chronic systolic and diastolic CHF -Clinically euvolemic -05/21/2015 echo EF 20 to 25%, grade 1 DD -02/07/2020 echo EF 50-55%, no WMA, grade 1 DD, mild AR -some suggestion of pulmonary edema on CXR, I have added lasix to see if we can improve pulmonary status with diuresis.  He diuresed nearly 1400 cc and now overall negative 2L.  His oxygen requirement now down to 9L/min.   Intake/Output Summary (Last 24 hours) at 05/30/2020 1338 Last data filed at 05/30/2020  0600 Gross per 24 hour  Intake --  Output 1400 ml  Net -1400  ml   Left carotid stenosis -Continue apixaban  Hyperlipidemia -Continue statin  BPH -Continue tamsulosin  Status is: Inpatient  Remains inpatient appropriate because:IV treatments appropriate due to intensity of illness or inability to take PO  Dispo: The patient is from:Home Anticipated d/c is to:Home with hospice services Anticipated d/c date is: 2-3 days Patient currently is not medically stable to d/c.  He remains on 9L/min supplemental oxygen.   Family Communication:Daughter updatedat bedside 12/1, son 12/2, daughter by phone 12/3  Consultants:none  Code Status: FULL  DVT Prophylaxis:apixaban  Procedures: As Listed in Progress Note Above  Antibiotics: Ceftriaxone >>DC azithro>>DC Doxycycline>>  Subjective: Patient reports that his breathing is a little better and he slept a little bit better.      Objective: Vitals:   05/30/20 0333 05/30/20 0359 05/30/20 0739 05/30/20 0829  BP:  (!) 166/81  (!) 169/84  Pulse:  73  72  Resp:  20    Temp:  (!) 97.5 F (36.4 C)    TempSrc:      SpO2:  93% 93%   Weight: 84.7 kg     Height:        Intake/Output Summary (Last 24 hours) at 05/30/2020 1332 Last data filed at 05/30/2020 0600 Gross per 24 hour  Intake --  Output 1400 ml  Net -1400 ml   Weight change: 1.1 kg Exam:   General:  Awake, alert, NAD, cooperative.   HEENT: No icterus, No thrush, No neck mass, [place]/AT  Cardiovascular: normal S1/S2, no rubs, no gallops  Respiratory: shallow BS bilateral, No rales heard.   Abdomen: Soft/+BS, non tender, non distended, no guarding  Extremities: No edema, No lymphangitis, No petechiae, No rashes, no synovitis  Neurological: nonfocal exam.  Data Reviewed: I have personally reviewed following labs and imaging studies Basic Metabolic Panel: Recent Labs  Lab 05/24/20 0653  05/25/20 0706 05/26/20 1050 05/27/20 0602 05/28/20 0528 05/29/20 0449 05/30/20 0547  NA 131*   < > 131* 132* 134* 135 135  K 3.9   < > 3.4* 3.5 3.9 4.1 4.0  CL 97*   < > 94* 97* 97* 98 97*  CO2 26   < > 24 26 28 29 29  GLUCOSE 95   < > 226* 129* 128* 119* 120*  BUN 25*   < > 32* 37* 41* 38* 41*  CREATININE 1.47*   < > 1.55* 1.37* 1.52* 1.51* 1.54*  CALCIUM 8.1*   < > 8.4* 8.1* 8.2* 8.2* 8.2*  MG 1.8  --  1.9  --  2.2 2.3 2.3   < > = values in this interval not displayed.   Liver Function Tests: Recent Labs  Lab 05/24/20 0942 05/25/20 0706 05/28/20 0528 05/29/20 0449 05/30/20 0547  AST 25 24 22 20 21  ALT 19 19 25 24 25  ALKPHOS 58 62 62 63 70  BILITOT 0.5 0.7 0.5 0.7 0.5  PROT 5.9* 6.0* 5.5* 5.6* 5.6*  ALBUMIN 2.3* 2.1* 2.1* 2.2* 2.3*   No results for input(s): LIPASE, AMYLASE in the last 168 hours. No results for input(s): AMMONIA in the last 168 hours. Coagulation Profile: No results for input(s): INR, PROTIME in the last 168 hours. CBC: Recent Labs  Lab 05/25/20 0706 05/27/20 0602 05/28/20 0528 05/29/20 0449 05/30/20 0547  WBC 12.0* 19.9* 16.2* 14.5* 16.8*  NEUTROABS 9.4* 18.2* 14.7* 12.7* 14.1*  HGB 10.2* 9.6* 10.0* 10.4* 11.2*  HCT 30.6* 28.5* 29.9* 31.4* 34.4*  MCV 97.1 96.0 97.1 96.9 97.5  PLT   480* 572* 574* 651* 685*   Cardiac Enzymes: No results for input(s): CKTOTAL, CKMB, CKMBINDEX, TROPONINI in the last 168 hours. BNP: Invalid input(s): POCBNP CBG: No results for input(s): GLUCAP in the last 168 hours. HbA1C: No results for input(s): HGBA1C in the last 72 hours. Urine analysis:    Component Value Date/Time   COLORURINE YELLOW 05/22/2020 [place] 05/22/2020 1041   LABSPEC 1.015 05/22/2020 1041   PHURINE 6.0 05/22/2020 1041   GLUCOSEU NEGATIVE 05/22/2020 1041   GLUCOSEU neg 02/26/2009 0000   HGBUR NEGATIVE 05/22/2020 [place] 05/22/2020 [place] 05/22/2020 1041   PROTEINUR 30 (A)  05/22/2020 1041   NITRITE NEGATIVE 05/22/2020 1041   LEUKOCYTESUR NEGATIVE 05/22/2020 1041    Recent Results (from the past 240 hour(s))  Resp Panel by RT-PCR (Flu A&B, Covid) Nasopharyngeal Swab     Status: None   Collection Time: 05/22/20  9:57 AM   Specimen: Nasopharyngeal Swab; Nasopharyngeal(NP) swabs in vial transport medium  Result Value Ref Range Status   SARS Coronavirus 2 by RT PCR NEGATIVE NEGATIVE Final    Comment: (NOTE) SARS-CoV-2 target nucleic acids are NOT DETECTED.  The SARS-CoV-2 RNA is generally detectable in upper respiratory specimens during the acute phase of infection. The lowest concentration of SARS-CoV-2 viral copies this assay can detect is 138 copies/mL. A negative result does not preclude SARS-Cov-2 infection and should not be used as the sole basis for treatment or other patient management decisions. A negative result may occur with  improper specimen collection/handling, submission of specimen other than nasopharyngeal swab, presence of viral mutation(s) within the areas targeted by this assay, and inadequate number of viral copies(<138 copies/mL). A negative result must be combined with clinical observations, patient history, and epidemiological information. The expected result is Negative.  Fact Sheet for Patients:  EntrepreneurPulse.com.au  Fact Sheet for Healthcare Providers:  IncredibleEmployment.be  This test is no t yet approved or cleared by the [place] FDA and  has been authorized for detection and/or diagnosis of SARS-CoV-2 by FDA under an Emergency Use Authorization (EUA). This EUA will remain  in effect (meaning this test can be used) for the duration of the COVID-19 declaration under Section 564(b)(1) of the Act, 21 U.S.C.section 360bbb-3(b)(1), unless the authorization is terminated  or revoked sooner.       Influenza A by PCR NEGATIVE NEGATIVE Final   Influenza B by PCR NEGATIVE  NEGATIVE Final    Comment: (NOTE) The Xpert Xpress SARS-CoV-2/FLU/RSV plus assay is intended as an aid in the diagnosis of influenza from Nasopharyngeal swab specimens and should not be used as a sole basis for treatment. Nasal washings and aspirates are unacceptable for Xpert Xpress SARS-CoV-2/FLU/RSV testing.  Fact Sheet for Patients: EntrepreneurPulse.com.au  Fact Sheet for Healthcare Providers: IncredibleEmployment.be  This test is not yet approved or cleared by the [place] FDA and has been authorized for detection and/or diagnosis of SARS-CoV-2 by FDA under an Emergency Use Authorization (EUA). This EUA will remain in effect (meaning this test can be used) for the duration of the COVID-19 declaration under Section 564(b)(1) of the Act, 21 U.S.C. section 360bbb-3(b)(1), unless the authorization is terminated or revoked.  Performed at [facility], [address], [place], [place] [address]   Blood culture (routine x 2)     Status: None   Collection Time: 05/22/20 10:45 AM   Specimen: BLOOD LEFT HAND  Result Value Ref Range Status   Specimen Description  Final    BLOOD LEFT HAND BOTTLES DRAWN AEROBIC AND ANAEROBIC   Special Requests   Final    Blood Culture results may not be optimal due to an inadequate volume of blood received in culture bottles   Culture   Final    NO GROWTH 6 DAYS Performed at [facility], [address] [place], [place] [address]    Report Status 05/28/2020 FINAL  Final  Blood culture (routine x 2)     Status: None   Collection Time: 05/22/20 10:52 AM   Specimen: Right Antecubital; Blood  Result Value Ref Range Status   Specimen Description   Final    RIGHT ANTECUBITAL BOTTLES DRAWN AEROBIC AND ANAEROBIC   Special Requests Blood Culture adequate volume  Final   Culture   Final    NO GROWTH 6 DAYS Performed at [facility], [address] [place], [place] [address]    Report Status 05/28/2020 FINAL   Final  Resp Panel by RT-PCR (Flu A&B, Covid)     Status: None   Collection Time: 05/23/20  6:40 PM  Result Value Ref Range Status   SARS Coronavirus 2 by RT PCR NEGATIVE NEGATIVE Final    Comment: (NOTE) SARS-CoV-2 target nucleic acids are NOT DETECTED.  The SARS-CoV-2 RNA is generally detectable in upper respiratory specimens during the acute phase of infection. The lowest concentration of SARS-CoV-2 viral copies this assay can detect is 138 copies/mL. A negative result does not preclude SARS-Cov-2 infection and should not be used as the sole basis for treatment or other patient management decisions. A negative result may occur with  improper specimen collection/handling, submission of specimen other than nasopharyngeal swab, presence of viral mutation(s) within the areas targeted by this assay, and inadequate number of viral copies(<138 copies/mL). A negative result must be combined with clinical observations, patient history, and epidemiological information. The expected result is Negative.  Fact Sheet for Patients:  https://www.fda.gov/media/152166/download  Fact Sheet for Healthcare Providers:  https://www.fda.gov/media/152162/download  This test is no t yet approved or cleared by the [place] FDA and  has been authorized for detection and/or diagnosis of SARS-CoV-2 by FDA under an Emergency Use Authorization (EUA). This EUA will remain  in effect (meaning this test can be used) for the duration of the COVID-19 declaration under Section 564(b)(1) of the Act, 21 U.S.C.section 360bbb-3(b)(1), unless the authorization is terminated  or revoked sooner.       Influenza A by PCR NEGATIVE NEGATIVE Final   Influenza B by PCR NEGATIVE NEGATIVE Final    Comment: (NOTE) The Xpert Xpress SARS-CoV-2/FLU/RSV plus assay is intended as an aid in the diagnosis of influenza from Nasopharyngeal swab specimens and should not be used as a sole basis for treatment. Nasal washings  and aspirates are unacceptable for Xpert Xpress SARS-CoV-2/FLU/RSV testing.  Fact Sheet for Patients: https://www.fda.gov/media/152166/download  Fact Sheet for Healthcare Providers: https://www.fda.gov/media/152162/download  This test is not yet approved or cleared by the [place] FDA and has been authorized for detection and/or diagnosis of SARS-CoV-2 by FDA under an Emergency Use Authorization (EUA). This EUA will remain in effect (meaning this test can be used) for the duration of the COVID-19 declaration under Section 564(b)(1) of the Act, 21 U.S.C. section 360bbb-3(b)(1), unless the authorization is terminated or revoked.  Performed at [facility], [address] [place], [place] [address]   Respiratory Panel by PCR     Status: None   Collection Time: 05/24/20 10:04 AM   Specimen: Nasopharyngeal Swab; Respiratory    Result Value Ref Range Status   Adenovirus NOT DETECTED NOT DETECTED Final   Coronavirus 229E NOT DETECTED NOT DETECTED Final    Comment: (NOTE) The Coronavirus on the Respiratory Panel, DOES NOT test for the novel  Coronavirus (2019 nCoV)    Coronavirus HKU1 NOT DETECTED NOT DETECTED Final   Coronavirus NL63 NOT DETECTED NOT DETECTED Final   Coronavirus OC43 NOT DETECTED NOT DETECTED Final   Metapneumovirus NOT DETECTED NOT DETECTED Final   Rhinovirus / Enterovirus NOT DETECTED NOT DETECTED Final   Influenza A NOT DETECTED NOT DETECTED Final   Influenza B NOT DETECTED NOT DETECTED Final   Parainfluenza Virus 1 NOT DETECTED NOT DETECTED Final   Parainfluenza Virus 2 NOT DETECTED NOT DETECTED Final   Parainfluenza Virus 3 NOT DETECTED NOT DETECTED Final   Parainfluenza Virus 4 NOT DETECTED NOT DETECTED Final   Respiratory Syncytial Virus NOT DETECTED NOT DETECTED Final   Bordetella pertussis NOT DETECTED NOT DETECTED Final   Chlamydophila pneumoniae NOT DETECTED NOT DETECTED Final   Mycoplasma pneumoniae NOT DETECTED NOT DETECTED Final    Comment:  Performed at [facility], [address] [place], [place] [address]     Scheduled Meds: . apixaban  2.5 mg Oral BID  . carvedilol  6.25 mg Oral BID WC  . doxycycline  100 mg Oral Q12H  . ferrous sulfate  325 mg Oral Q breakfast  . furosemide  20 mg Oral Daily  . guaiFENesin  600 mg Oral BID  . ipratropium-albuterol  3 mL Nebulization BID  . methocarbamol  500 mg Oral TID  . methylPREDNISolone (SOLU-MEDROL) injection  40 mg Intravenous Q12H  . polyethylene glycol  17 g Oral Daily  . polyvinyl alcohol  1 drop Both Eyes TID  . pravastatin  20 mg Oral QPM  . senna  2 tablet Oral Daily  . sodium chloride flush  3 mL Intravenous Q12H  . sodium chloride flush  3 mL Intravenous Q12H  . tamsulosin  0.4 mg Oral Daily   Continuous Infusions: . sodium chloride      Procedures/Studies: CT ABDOMEN PELVIS WO CONTRAST  Result Date: 05/24/2020 CLINICAL DATA:  Generalized weakness and fever of unknown origin. EXAM: CT ABDOMEN AND PELVIS WITHOUT CONTRAST TECHNIQUE: Multidetector CT imaging of the abdomen and pelvis was performed following the standard protocol without IV contrast. COMPARISON:  Chest CT dated 05/22/2020 and renal ultrasound dated 06/02/2009. FINDINGS: Lower chest: Cardiomegaly is partially imaged. There are small bilateral pleural effusions which appear similar to prior exam. Diffuse bilateral ground-glass opacities appear unchanged from prior exam. Hepatobiliary: No focal liver abnormality is seen. No gallstones, gallbladder wall thickening, or biliary dilatation. Pancreas: Unremarkable. No pancreatic ductal dilatation or surrounding inflammatory changes. Spleen: Normal in size without focal abnormality. Adrenals/Urinary Tract: Adrenal glands are unremarkable. Bilateral renal cysts are noted, measuring up to 2.1 cm on the right and 1.8 cm on the left. Kidneys are normal, without renal calculi, focal lesion, or hydronephrosis. Multiple urinary bladder diverticula are noted and there  is associated urinary bladder wall thickening, likely reflecting bladder outlet obstruction given the patient's large prostate. Stomach/Bowel: Stomach is within normal limits. Colonic diverticula are noted, particularly in the sigmoid colon. There is a mild fat stranding surrounding the proximal sigmoid colon (series 5 images 42-49) which may reflect acute diverticulitis. No evidence of abscess or fistula formation. No pericecal inflammatory changes are noted to suggest acute appendicitis. No evidence of bowel obstruction. Vascular/Lymphatic: Aortic atherosclerosis. No enlarged abdominal or pelvic   lymph nodes. Reproductive: The prostate is enlarged, measuring 6.5 cm in transverse dimension, and indents the bladder. Other: No abdominal wall hernia or abnormality. No abdominopelvic ascites. Musculoskeletal: Chronic appearing wedge deformities are seen at T12 and L4 with less than 25% height loss. Insert degenerative IMPRESSION: 1. Mild fat stranding surrounding the proximal sigmoid colon may reflect acute diverticulitis. No evidence of complication. 2. Multiple urinary bladder diverticula with associated urinary bladder wall thickening, likely reflecting bladder outlet obstruction given the patient's large prostate. 3. Small bilateral pleural effusions with diffuse bilateral ground-glass opacities appear unchanged from prior exam. Aortic Atherosclerosis (ICD10-I70.0). Electronically Signed   By: Tyler  Litton M.D.   On: 05/24/2020 16:46   CT Angio Chest PE W and/or Wo Contrast  Result Date: 05/22/2020 CLINICAL DATA:  89-year-old male with chest pain. Weakness. Shortness of breath. Abnormal D-dimer. EXAM: CT ANGIOGRAPHY CHEST WITH CONTRAST TECHNIQUE: Multidetector CT imaging of the chest was performed using the standard protocol during bolus administration of intravenous contrast. Multiplanar CT image reconstructions and MIPs were obtained to evaluate the vascular anatomy. CONTRAST:  75mL OMNIPAQUE IOHEXOL 350  MG/ML SOLN COMPARISON:  Portable chest earlier today. Chest radiographs 07/09/2015. Neck CT, CTA 07/03/2017 and earlier. FINDINGS: Cardiovascular: Good contrast bolus timing in the pulmonary arterial tree. No focal filling defect identified in the pulmonary arteries to suggest acute pulmonary embolism. Mild to moderate enlargement of central pulmonary arteries relative to the aorta (series 3, image 147). Calcified aortic atherosclerosis. Calcified coronary artery atherosclerosis. Cardiomegaly. Left chest cardiac pacemaker type device. No pericardial effusion. Mediastinum/Nodes: Negative.  No lymphadenopathy. Lungs/Pleura: Major airways are patent. Widespread although fairly symmetric peripheral, peribronchial, and also in the lung apices this is new or progressed since 2019. No consolidation. Trace layering pleural fluid greater on the left. And dependent pulmonary ground-glass opacity in both lungs Upper Abdomen: Negative for age visible upper abdominal viscera Musculoskeletal: Chronic T12 superior endplate compression suspected to be stable since 2017 radiographs. No acute osseous abnormality identified. Review of the MIP images confirms the above findings. IMPRESSION: 1. Negative for pulmonary embolus. 2. Widespread abnormal although nonspecific pulmonary ground-glass opacity with trace layering pleural fluid. Acute viral/atypical respiratory infection not excluded. Chronic interstitial lung disease is also a consideration. 3. Cardiomegaly with mild to moderate enlargement of the central pulmonary arteries raising the possibility of pulmonary artery hypertension. Aortic Atherosclerosis (ICD10-I70.0). Electronically Signed   By: H  Hall M.D.   On: 05/22/2020 14:38   DG CHEST PORT 1 VIEW  Result Date: 05/29/2020 CLINICAL DATA:  Acute on chronic respiratory failure with hypoxia. EXAM: PORTABLE CHEST 1 VIEW COMPARISON:  05/25/2020 FINDINGS: The cardiac silhouette remains enlarged, and a 3 lead pacemaker remains  in place. Mixed interstitial and patchy airspace opacities bilaterally have not significantly changed. No sizable pleural effusion or pneumothorax is identified. IMPRESSION: Unchanged bilateral lung opacities which could reflect infection or edema, potentially superimposed on chronic interstitial lung disease. Electronically Signed   By: Allen  Grady M.D.   On: 05/29/2020 10:26   DG CHEST PORT 1 VIEW  Result Date: 05/25/2020 CLINICAL DATA:  Shortness of breath EXAM: PORTABLE CHEST 1 VIEW COMPARISON:  05/22/2020 FINDINGS: Likely chronic interstitial lung changes. Patchy superimposed increased density. No significant pleural effusion. No pneumothorax. Stable cardiomediastinal contours. Left chest wall pacemaker. IMPRESSION: Patchy increased density superimposed on likely chronic interstitial lung disease. This may reflect superimposed pneumonia or edema. Electronically Signed   By: Praneil  Patel M.D.   On: 05/25/2020 08:27   DG Chest Port 1 View    Result Date: 05/22/2020 CLINICAL DATA:  Weakness and shortness of breath. EXAM: PORTABLE CHEST 1 VIEW COMPARISON:  Chest x-ray 07/09/2015. FINDINGS: AICD in stable position. Cardiomegaly with mild bilateral interstitial prominence suggesting interstitial edema. Pneumonitis cannot be excluded. No pleural effusion or pneumothorax. IMPRESSION: AICD in stable position. Cardiomegaly with mild bilateral interstitial prominence suggesting interstitial edema. Pneumonitis cannot be excluded. Electronically Signed   By: Marcello Moores  Register   On: 05/22/2020 11:10   CUP PACEART REMOTE DEVICE CHECK  Result Date: 05/06/2020 Scheduled remote reviewed. Normal device function.  1 SVT logged 7 minutes 12 seconds w/ rates 160's bpm 1 ATR - same event as above 10 PMT successfully terminated by PMT algorithm Next remote 91 days.  Irwin Brakeman, MD  [facility]  If 7PM-7AM, please contact night-coverage [email or web address] Password [email or web address] 05/30/2020, 1:32 PM   LOS: 8 days

## 2020-05-31 ENCOUNTER — Inpatient Hospital Stay (HOSPITAL_COMMUNITY): Payer: Medicare Other

## 2020-05-31 DIAGNOSIS — J9601 Acute respiratory failure with hypoxia: Secondary | ICD-10-CM | POA: Diagnosis not present

## 2020-05-31 DIAGNOSIS — I4891 Unspecified atrial fibrillation: Secondary | ICD-10-CM | POA: Diagnosis not present

## 2020-05-31 DIAGNOSIS — I482 Chronic atrial fibrillation, unspecified: Secondary | ICD-10-CM | POA: Diagnosis not present

## 2020-05-31 DIAGNOSIS — I5022 Chronic systolic (congestive) heart failure: Secondary | ICD-10-CM | POA: Diagnosis not present

## 2020-05-31 LAB — COMPREHENSIVE METABOLIC PANEL
ALT: 22 U/L (ref 0–44)
AST: 19 U/L (ref 15–41)
Albumin: 2.3 g/dL — ABNORMAL LOW (ref 3.5–5.0)
Alkaline Phosphatase: 65 U/L (ref 38–126)
Anion gap: 8 (ref 5–15)
BUN: 41 mg/dL — ABNORMAL HIGH (ref 8–23)
CO2: 29 mmol/L (ref 22–32)
Calcium: 8 mg/dL — ABNORMAL LOW (ref 8.9–10.3)
Chloride: 97 mmol/L — ABNORMAL LOW (ref 98–111)
Creatinine, Ser: 1.35 mg/dL — ABNORMAL HIGH (ref 0.61–1.24)
GFR, Estimated: 50 mL/min — ABNORMAL LOW (ref >60)
Glucose, Bld: 104 mg/dL — ABNORMAL HIGH (ref 70–99)
Potassium: 3.9 mmol/L (ref 3.5–5.1)
Sodium: 134 mmol/L — ABNORMAL LOW (ref 135–145)
Total Bilirubin: 0.3 mg/dL (ref 0.3–1.2)
Total Protein: 5.3 g/dL — ABNORMAL LOW (ref 6.5–8.1)

## 2020-05-31 LAB — CBC WITH DIFFERENTIAL/PLATELET
Abs Immature Granulocytes: 0.97 10*3/uL — ABNORMAL HIGH (ref 0.00–0.07)
Basophils Absolute: 0.1 10*3/uL (ref 0.0–0.1)
Basophils Relative: 1 %
Eosinophils Absolute: 0 10*3/uL (ref 0.0–0.5)
Eosinophils Relative: 0 %
HCT: 34.5 % — ABNORMAL LOW (ref 39.0–52.0)
Hemoglobin: 11.4 g/dL — ABNORMAL LOW (ref 13.0–17.0)
Immature Granulocytes: 5 %
Lymphocytes Relative: 3 %
Lymphs Abs: 0.7 10*3/uL (ref 0.7–4.0)
MCH: 32.1 pg (ref 26.0–34.0)
MCHC: 33 g/dL (ref 30.0–36.0)
MCV: 97.2 fL (ref 80.0–100.0)
Monocytes Absolute: 1.3 10*3/uL — ABNORMAL HIGH (ref 0.1–1.0)
Monocytes Relative: 7 %
Neutro Abs: 16.6 10*3/uL — ABNORMAL HIGH (ref 1.7–7.7)
Neutrophils Relative %: 84 %
Platelets: 652 10*3/uL — ABNORMAL HIGH (ref 150–400)
RBC: 3.55 MIL/uL — ABNORMAL LOW (ref 4.22–5.81)
RDW: 12.3 % (ref 11.5–15.5)
WBC: 19.7 10*3/uL — ABNORMAL HIGH (ref 4.0–10.5)
nRBC: 0 % (ref 0.0–0.2)

## 2020-05-31 LAB — MAGNESIUM: Magnesium: 2.3 mg/dL (ref 1.7–2.4)

## 2020-05-31 NOTE — Progress Notes (Signed)
PROGRESS NOTE  Max Cole LOV:[id number] DOB: 30-Jun-1927 DOA: 05/22/2020 PCP: Asencion Noble, MD  Brief History: 86 y.o.malewith medical history ofsystolic CHF status postBiV PPM, CKD stage III, carotid stenosis, hyperlipidemia,PAF on apixabanand hypertension presentingapproximately 10-day history of generalized weakness, dyspnea on exertion, fevers and chills, and shortness of breath. Apparently, the patient stated that he saw his primary care provider approximately 2 to 3 weeks prior to this admission. He was placed on a prednisone taper. There is no improvement of his symptoms. He took some over-the-counter NyQuil with no improvement. He called his PCP again, the patient was placed on empiric dose of Tamiflu. He continued to have malaise and shortness of breath. As result, he presented for further evaluation. He denied any nausea, vomiting, diarrhea, chest pain, hemoptysis, abdominal pain, dysuria, hematuria, hematochezia, melena. The patient has not started any other new medications except for Robaxin and prednisone. He denies any worsening lower extremity edema, orthopnea, chest pain, headache, sore throat.He has one cat, no exotic pets. No travels. In the emergency department, the patient was febrile up to 100.42F. He was hemodynamically stable with oxygen saturation down to 80% on room air. He was placed on 4 L nasal cannula with saturation 93-94%. CTA chest was negative for PE but showed bilateral widespread groundglass opacities there was cardiomegaly with enlarged central pulmonary arteries. There is a chronic T12 endplate fracture which was stable compared to 2017.  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 -continue IS and flutter valve -wean oxygen for saturation 88-92%   Atypical pneumonia/lobar pneumonia -Urine Legionella antigen--neg -Urine Streptococcus pneumonia antigen--neg -Viral respiratory panel--neg -Continued azithromycin and ceftriaxone -switched to doxy 12/3 -Lactic acid 1.3  FUO -due to amiodarone toxicity--last dose 11/29 -now afebrile -UA neg for pyuria -CT chest as discussed above -blood cultures remain neg -CT abd/pelvis--sm bilateral pleural eff; unchanged GGO; mild fat stranding around sigmoid colon; ur bladder wall thickening  Paroxysmal atrial fibrillation -CHADSVASc = 5 -continue apixaban -currently in sinus -Discontinue amiodarone -continue coreg  CKD stage IIIb -Baseline creatinine 1.5-1.7 -A.m. BMP  Chronic systolic and diastolic CHF -Clinically euvolemic -05/21/2015 echo EF 20 to 25%, grade 1 DD -02/07/2020 echo EF 50-55%, no WMA, grade 1 DD, mild AR -Clinically compensated presently -Daily weights--standing weight on 11/29--179 lb  Left carotid stenosis -Continue apixaban  Hyperlipidemia -Continue statin  BPH -Continue tamsulosin  Goals of Care -given advanced age and multiple comorbidities--requested for goals of care discussions with palliative medicine.  Family wants to try for a couple more days to see if we can achieve any further clinical improvements, if not, they would be agreeable to comfort care measures.  The patient's priority goal is to go home.  If he has no meaningful improvement then would not want to continue full scope care.       Status is: Inpatient  Remains inpatient appropriate because:IV treatments appropriate  due to intensity of illness or inability to take PO   Dispo: The patient is from:Home Anticipated d/c is tOA:CZYSAnticipated d/c date is: 2 days Patient currently is not medically  stable to d/c.        Family Communication:Daughter updatedat bedside 12/5  Consultants:none  Code Status: FULL  DVT Prophylaxis:apixaban   Procedures: As Listed in Progress Note Above  Antibiotics: Ceftriaxone 11/27>>12/1 azithro11/26>>12/1 Doxy 12/3>>12/5    Total time spent 35 minutes.  Greater than 50% spent face to face counseling and coordinating care.      Subjective: Patient feels breathing overall is slowly improving, but remains sob with minimal exertion.  Denies f/c , cp, n/v/d, abd pain  Objective: Vitals:   05/31/20 0535 05/31/20 0817 05/31/20 1038 05/31/20 1400  BP: (!) 169/84  132/67 (!) 158/75  Pulse: 79  81 80  Resp: 16   20  Temp: (!) 97.5 F (36.4 C)   98.2 F (36.8 C)  TempSrc:      SpO2: 93% 94%  93%  Weight:      Height:        Intake/Output Summary (Last 24 hours) at 05/31/2020 1649 Last data filed at 05/31/2020 0700 Gross per 24 hour  Intake --  Output 1400 ml  Net -1400 ml   Weight change:  Exam:   General:  Pt is alert, follows commands appropriately, not in acute distress  HEENT: No icterus, No thrush, No neck mass, [place]/AT  Cardiovascular: RRR, S1/S2, no rubs, no gallops  Respiratory: bilateral rales. No wheeze  Abdomen: Soft/+BS, non tender, non distended, no guarding  Extremities: No edema, No lymphangitis, No petechiae, No rashes, no synovitis   Data Reviewed: I have personally reviewed following labs and imaging studies Basic Metabolic Panel: Recent Labs  Lab 05/26/20 1050 05/26/20 1050 05/27/20 0602 05/28/20 0528 05/29/20 0449 05/30/20 0547 05/31/20 0630  NA 131*   < > 132* 134* 135 135 134*  K 3.4*   < > 3.5 3.9 4.1 4.0 3.9  CL 94*   < > 97* 97* 98  97* 97*  CO2 24   < > _0 GLUCOSE 226*   < > 129* 128* 119* 120* 104*  BUN 32*   < > 37* 41* 38* 41* 41*  CREATININE 1.55*   < > 1.37* 1.52* 1.51* 1.54* 1.35*  CALCIUM 8.4*   < > 8.1* 8.2* 8.2* 8.2* 8.0*  MG 1.9  --   --  2.2 2.3 2.3 2.3   < > = values in this interval not displayed.   Liver Function Tests: Recent Labs  Lab 05/25/20 0706 05/28/20 0528 05/29/20 0449 05/30/20 0547 05/31/20 0630  AST _1 ALT _2 ALKPHOS 62 62 63 70 65  BILITOT 0.7 0.5 0.7 0.5 0.3  PROT 6.0* 5.5* 5.6* 5.6* 5.3*  ALBUMIN 2.1* 2.1* 2.2* 2.3* 2.3*   No results for input(s): LIPASE, AMYLASE in the last 168 hours. No results for input(s): AMMONIA in the last 168 hours. Coagulation Profile: No results for input(s): INR, PROTIME in the last 168 hours. CBC: Recent Labs  Lab 05/27/20 0602 05/28/20 0528 05/29/20 0449 05/30/20 0547 05/31/20 0630  WBC 19.9* 16.2* 14.5* 16.8* 19.7*  NEUTROABS 18.2* 14.7* 12.7* 14.1* 16.6*  HGB 9.6* 10.0* 10.4* 11.2* 11.4*  HCT 28.5* 29.9* 31.4* 34.4* 34.5*  MCV 96.0 97.1 96.9 97.5 97.2  PLT 572* 574* 651* 685* 652*   Cardiac Enzymes: No results for input(s): CKTOTAL, CKMB, CKMBINDEX, TROPONINI in the last 168 hours. BNP: Invalid input(s): POCBNP CBG: No results for input(s): GLUCAP in the last 168 hours. HbA1C: No results for input(s):  HGBA1C in the last 72 hours. Urine analysis:    Component Value Date/Time   COLORURINE YELLOW 05/22/2020 [place] 05/22/2020 1041   LABSPEC 1.015 05/22/2020 1041   PHURINE 6.0 05/22/2020 1041   GLUCOSEU NEGATIVE 05/22/2020 1041   GLUCOSEU neg 02/26/2009 0000   HGBUR NEGATIVE 05/22/2020 [place] 05/22/2020 [place] 05/22/2020 1041   PROTEINUR 30 (A) 05/22/2020 1041   NITRITE NEGATIVE 05/22/2020 1041   LEUKOCYTESUR NEGATIVE 05/22/2020 1041   Sepsis Labs: _0 (procalcitonin:4,lacticidven:4) ) Recent Results (from the past 240  hour(s))  Resp Panel by RT-PCR (Flu A&B, Covid) Nasopharyngeal Swab     Status: None   Collection Time: 05/22/20  9:57 AM   Specimen: Nasopharyngeal Swab; Nasopharyngeal(NP) swabs in vial transport medium  Result Value Ref Range Status   SARS Coronavirus 2 by RT PCR NEGATIVE NEGATIVE Final    Comment: (NOTE) SARS-CoV-2 target nucleic acids are NOT DETECTED.  The SARS-CoV-2 RNA is generally detectable in upper respiratory specimens during the acute phase of infection. The lowest concentration of SARS-CoV-2 viral copies this assay can detect is 138 copies/mL. A negative result does not preclude SARS-Cov-2 infection and should not be used as the sole basis for treatment or other patient management decisions. A negative result may occur with  improper specimen collection/handling, submission of specimen other than nasopharyngeal swab, presence of viral mutation(s) within the areas targeted by this assay, and inadequate number of viral copies(<138 copies/mL). A negative result must be combined with clinical observations, patient history, and epidemiological information. The expected result is Negative.  Fact Sheet for Patients:  EntrepreneurPulse.com.au  Fact Sheet for Healthcare Providers:  IncredibleEmployment.be  This test is no t yet approved or cleared by the [place] FDA and  has been authorized for detection and/or diagnosis of SARS-CoV-2 by FDA under an Emergency Use Authorization (EUA). This EUA will remain  in effect (meaning this test can be used) for the duration of the COVID-19 declaration under Section 564(b)(1) of the Act, 21 U.S.C.section 360bbb-3(b)(1), unless the authorization is terminated  or revoked sooner.       Influenza A by PCR NEGATIVE NEGATIVE Final   Influenza B by PCR NEGATIVE NEGATIVE Final    Comment: (NOTE) The Xpert Xpress SARS-CoV-2/FLU/RSV plus assay is intended as an aid in the diagnosis of influenza from  Nasopharyngeal swab specimens and should not be used as a sole basis for treatment. Nasal washings and aspirates are unacceptable for Xpert Xpress SARS-CoV-2/FLU/RSV testing.  Fact Sheet for Patients: EntrepreneurPulse.com.au  Fact Sheet for Healthcare Providers: IncredibleEmployment.be  This test is not yet approved or cleared by the [place] FDA and has been authorized for detection and/or diagnosis of SARS-CoV-2 by FDA under an Emergency Use Authorization (EUA). This EUA will remain in effect (meaning this test can be used) for the duration of the COVID-19 declaration under Section 564(b)(1) of the Act, 21 U.S.C. section 360bbb-3(b)(1), unless the authorization is terminated or revoked.  Performed at [facility], [address] [place], [place] [address]   Blood culture (routine x 2)     Status: None   Collection Time: 05/22/20 10:45 AM   Specimen: BLOOD LEFT HAND  Result Value Ref Range Status   Specimen Description   Final    BLOOD LEFT HAND BOTTLES DRAWN AEROBIC AND ANAEROBIC   Special Requests   Final    Blood Culture results may not be optimal due to an inadequate volume of blood received in  culture bottles   Culture   Final    NO GROWTH 6 DAYS Performed at [facility], [address] [place], [place] [address]    Report Status 05/28/2020 FINAL  Final  Blood culture (routine x 2)     Status: None   Collection Time: 05/22/20 10:52 AM   Specimen: Right Antecubital; Blood  Result Value Ref Range Status   Specimen Description   Final    RIGHT ANTECUBITAL BOTTLES DRAWN AEROBIC AND ANAEROBIC   Special Requests Blood Culture adequate volume  Final   Culture   Final    NO GROWTH 6 DAYS Performed at [facility], [address] [place], [place] [address]    Report Status 05/28/2020 FINAL  Final  Resp Panel by RT-PCR (Flu A&B, Covid)     Status: None   Collection Time: 05/23/20  6:40 PM  Result Value Ref Range Status   SARS  Coronavirus 2 by RT PCR NEGATIVE NEGATIVE Final    Comment: (NOTE) SARS-CoV-2 target nucleic acids are NOT DETECTED.  The SARS-CoV-2 RNA is generally detectable in upper respiratory specimens during the acute phase of infection. The lowest concentration of SARS-CoV-2 viral copies this assay can detect is 138 copies/mL. A negative result does not preclude SARS-Cov-2 infection and should not be used as the sole basis for treatment or other patient management decisions. A negative result may occur with  improper specimen collection/handling, submission of specimen other than nasopharyngeal swab, presence of viral mutation(s) within the areas targeted by this assay, and inadequate number of viral copies(<138 copies/mL). A negative result must be combined with clinical observations, patient history, and epidemiological information. The expected result is Negative.  Fact Sheet for Patients:  EntrepreneurPulse.com.au  Fact Sheet for Healthcare Providers:  IncredibleEmployment.be  This test is no t yet approved or cleared by the [place] FDA and  has been authorized for detection and/or diagnosis of SARS-CoV-2 by FDA under an Emergency Use Authorization (EUA). This EUA will remain  in effect (meaning this test can be used) for the duration of the COVID-19 declaration under Section 564(b)(1) of the Act, 21 U.S.C.section 360bbb-3(b)(1), unless the authorization is terminated  or revoked sooner.       Influenza A by PCR NEGATIVE NEGATIVE Final   Influenza B by PCR NEGATIVE NEGATIVE Final    Comment: (NOTE) The Xpert Xpress SARS-CoV-2/FLU/RSV plus assay is intended as an aid in the diagnosis of influenza from Nasopharyngeal swab specimens and should not be used as a sole basis for treatment. Nasal washings and aspirates are unacceptable for Xpert Xpress SARS-CoV-2/FLU/RSV testing.  Fact Sheet for  Patients: EntrepreneurPulse.com.au  Fact Sheet for Healthcare Providers: IncredibleEmployment.be  This test is not yet approved or cleared by the [place] FDA and has been authorized for detection and/or diagnosis of SARS-CoV-2 by FDA under an Emergency Use Authorization (EUA). This EUA will remain in effect (meaning this test can be used) for the duration of the COVID-19 declaration under Section 564(b)(1) of the Act, 21 U.S.C. section 360bbb-3(b)(1), unless the authorization is terminated or revoked.  Performed at [facility], [address], [place], [place] [address]   Respiratory Panel by PCR     Status: None   Collection Time: 05/24/20 10:04 AM   Specimen: Nasopharyngeal Swab; Respiratory  Result Value Ref Range Status   Adenovirus NOT DETECTED NOT DETECTED Final   Coronavirus 229E NOT DETECTED NOT DETECTED Final    Comment: (NOTE) The Coronavirus on the Respiratory Panel, DOES NOT test for  the novel  Coronavirus (2019 nCoV)    Coronavirus HKU1 NOT DETECTED NOT DETECTED Final   Coronavirus NL63 NOT DETECTED NOT DETECTED Final   Coronavirus OC43 NOT DETECTED NOT DETECTED Final   Metapneumovirus NOT DETECTED NOT DETECTED Final   Rhinovirus / Enterovirus NOT DETECTED NOT DETECTED Final   Influenza A NOT DETECTED NOT DETECTED Final   Influenza B NOT DETECTED NOT DETECTED Final   Parainfluenza Virus 1 NOT DETECTED NOT DETECTED Final   Parainfluenza Virus 2 NOT DETECTED NOT DETECTED Final   Parainfluenza Virus 3 NOT DETECTED NOT DETECTED Final   Parainfluenza Virus 4 NOT DETECTED NOT DETECTED Final   Respiratory Syncytial Virus NOT DETECTED NOT DETECTED Final   Bordetella pertussis NOT DETECTED NOT DETECTED Final   Chlamydophila pneumoniae NOT DETECTED NOT DETECTED Final   Mycoplasma pneumoniae NOT DETECTED NOT DETECTED Final    Comment: Performed at [facility], [place] [address] [place], [place] [address]     Scheduled  Meds: . apixaban  2.5 mg Oral BID  . carvedilol  6.25 mg Oral BID WC  . doxycycline  100 mg Oral Q12H  . ferrous sulfate  325 mg Oral Q breakfast  . furosemide  20 mg Oral Daily  . guaiFENesin  600 mg Oral BID  . ipratropium-albuterol  3 mL Nebulization BID  . methocarbamol  500 mg Oral TID  . methylPREDNISolone (SOLU-MEDROL) injection  40 mg Intravenous Q12H  . polyvinyl alcohol  1 drop Both Eyes TID  . pravastatin  20 mg Oral QPM  . sodium chloride flush  3 mL Intravenous Q12H  . sodium chloride flush  3 mL Intravenous Q12H  . tamsulosin  0.4 mg Oral Daily   Continuous Infusions: . sodium chloride      Procedures/Studies: CT ABDOMEN PELVIS WO CONTRAST  Result Date: 05/24/2020 CLINICAL DATA:  Generalized weakness and fever of unknown origin. EXAM: CT ABDOMEN AND PELVIS WITHOUT CONTRAST TECHNIQUE: Multidetector CT imaging of the abdomen and pelvis was performed following the standard protocol without IV contrast. COMPARISON:  Chest CT dated 05/22/2020 and renal ultrasound dated 06/02/2009. FINDINGS: Lower chest: Cardiomegaly is partially imaged. There are small bilateral pleural effusions which appear similar to prior exam. Diffuse bilateral ground-glass opacities appear unchanged from prior exam. Hepatobiliary: No focal liver abnormality is seen. No gallstones, gallbladder wall thickening, or biliary dilatation. Pancreas: Unremarkable. No pancreatic ductal dilatation or surrounding inflammatory changes. Spleen: Normal in size without focal abnormality. Adrenals/Urinary Tract: Adrenal glands are unremarkable. Bilateral renal cysts are noted, measuring up to 2.1 cm on the right and 1.8 cm on the left. Kidneys are normal, without renal calculi, focal lesion, or hydronephrosis. Multiple urinary bladder diverticula are noted and there is associated urinary bladder wall thickening, likely reflecting bladder outlet obstruction given the patient's large prostate. Stomach/Bowel: Stomach is within  normal limits. Colonic diverticula are noted, particularly in the sigmoid colon. There is a mild fat stranding surrounding the proximal sigmoid colon (series 5 images 42-49) which may reflect acute diverticulitis. No evidence of abscess or fistula formation. No pericecal inflammatory changes are noted to suggest acute appendicitis. No evidence of bowel obstruction. Vascular/Lymphatic: Aortic atherosclerosis. No enlarged abdominal or pelvic lymph nodes. Reproductive: The prostate is enlarged, measuring 6.5 cm in transverse dimension, and indents the bladder. Other: No abdominal wall hernia or abnormality. No abdominopelvic ascites. Musculoskeletal: Chronic appearing wedge deformities are seen at T12 and L4 with less than 25% height loss. Insert degenerative IMPRESSION: 1. Mild fat stranding surrounding the proximal  sigmoid colon may reflect acute diverticulitis. No evidence of complication. 2. Multiple urinary bladder diverticula with associated urinary bladder wall thickening, likely reflecting bladder outlet obstruction given the patient's large prostate. 3. Small bilateral pleural effusions with diffuse bilateral ground-glass opacities appear unchanged from prior exam. Aortic Atherosclerosis (ICD10-I70.0). Electronically Signed   By: Zerita Boers M.D.   On: 05/24/2020 16:46   CT Angio Chest PE W and/or Wo Contrast  Result Date: 05/22/2020 CLINICAL DATA:  86 year old male with chest pain. Weakness. Shortness of breath. Abnormal D-dimer. EXAM: CT ANGIOGRAPHY CHEST WITH CONTRAST TECHNIQUE: Multidetector CT imaging of the chest was performed using the standard protocol during bolus administration of intravenous contrast. Multiplanar CT image reconstructions and MIPs were obtained to evaluate the vascular anatomy. CONTRAST:  13m OMNIPAQUE IOHEXOL 350 MG/ML SOLN COMPARISON:  Portable chest earlier today. Chest radiographs 07/09/2015. Neck CT, CTA 07/03/2017 and earlier. FINDINGS: Cardiovascular: Good contrast  bolus timing in the pulmonary arterial tree. No focal filling defect identified in the pulmonary arteries to suggest acute pulmonary embolism. Mild to moderate enlargement of central pulmonary arteries relative to the aorta (series 3, image 147). Calcified aortic atherosclerosis. Calcified coronary artery atherosclerosis. Cardiomegaly. Left chest cardiac pacemaker type device. No pericardial effusion. Mediastinum/Nodes: Negative.  No lymphadenopathy. Lungs/Pleura: Major airways are patent. Widespread although fairly symmetric peripheral, peribronchial, and also in the lung apices this is new or progressed since 2019. No consolidation. Trace layering pleural fluid greater on the left. And dependent pulmonary ground-glass opacity in both lungs Upper Abdomen: Negative for age visible upper abdominal viscera Musculoskeletal: Chronic T12 superior endplate compression suspected to be stable since 2017 radiographs. No acute osseous abnormality identified. Review of the MIP images confirms the above findings. IMPRESSION: 1. Negative for pulmonary embolus. 2. Widespread abnormal although nonspecific pulmonary ground-glass opacity with trace layering pleural fluid. Acute viral/atypical respiratory infection not excluded. Chronic interstitial lung disease is also a consideration. 3. Cardiomegaly with mild to moderate enlargement of the central pulmonary arteries raising the possibility of pulmonary artery hypertension. Aortic Atherosclerosis (ICD10-I70.0). Electronically Signed   By: HGenevie AnnM.D.   On: 05/22/2020 14:38   DG CHEST PORT 1 VIEW  Result Date: 05/31/2020 CLINICAL DATA:  Malaise and shortness of breath. Ten day history of generalized weakness, dyspnea on exertion, fever and chills, shortness of breath. EXAM: PORTABLE CHEST 1 VIEW COMPARISON:  Chest x-rays dated 05/29/2020 and 05/25/2020 FINDINGS: Persistent bilateral ground-glass airspace opacities, not significantly changed compared to the recent studies of  05/29/2020 and 05/25/2020, perhaps slightly worsened on the RIGHT compared to the earlier chest x-ray of 05/22/2020, corresponding to the patchy ground-glass opacities demonstrated on chest CT of 05/22/2020. No new lung findings. No pleural effusion or pneumothorax is seen. Stable cardiomegaly. LEFT chest wall pacemaker/ICD apparatus is stable. Osseous structures about the chest are unremarkable. IMPRESSION: Persistent bilateral ground-glass airspace opacities, not significantly changed. Differential includes atypical pneumonias such as viral or fungal, interstitial pneumonias, edema related to volume overload/CHF, and chronic interstitial diseases. Favor pneumonia versus asymmetric pulmonary edema superimposed on chronic interstitial lung disease. Electronically Signed   By: SFranki CabotM.D.   On: 05/31/2020 05:54   DG CHEST PORT 1 VIEW  Result Date: 05/29/2020 CLINICAL DATA:  Acute on chronic respiratory failure with hypoxia. EXAM: PORTABLE CHEST 1 VIEW COMPARISON:  05/25/2020 FINDINGS: The cardiac silhouette remains enlarged, and a 3 lead pacemaker remains in place. Mixed interstitial and patchy airspace opacities bilaterally have not significantly changed. No sizable pleural effusion or pneumothorax is identified. IMPRESSION:  Unchanged bilateral lung opacities which could reflect infection or edema, potentially superimposed on chronic interstitial lung disease. Electronically Signed   By: Logan Bores M.D.   On: 05/29/2020 10:26   DG CHEST PORT 1 VIEW  Result Date: 05/25/2020 CLINICAL DATA:  Shortness of breath EXAM: PORTABLE CHEST 1 VIEW COMPARISON:  05/22/2020 FINDINGS: Likely chronic interstitial lung changes. Patchy superimposed increased density. No significant pleural effusion. No pneumothorax. Stable cardiomediastinal contours. Left chest wall pacemaker. IMPRESSION: Patchy increased density superimposed on likely chronic interstitial lung disease. This may reflect superimposed pneumonia or  edema. Electronically Signed   By: Macy Mis M.D.   On: 05/25/2020 08:27   DG Chest Port 1 View  Result Date: 05/22/2020 CLINICAL DATA:  Weakness and shortness of breath. EXAM: PORTABLE CHEST 1 VIEW COMPARISON:  Chest x-ray 07/09/2015. FINDINGS: AICD in stable position. Cardiomegaly with mild bilateral interstitial prominence suggesting interstitial edema. Pneumonitis cannot be excluded. No pleural effusion or pneumothorax. IMPRESSION: AICD in stable position. Cardiomegaly with mild bilateral interstitial prominence suggesting interstitial edema. Pneumonitis cannot be excluded. Electronically Signed   By: Marcello Moores  Register   On: 05/22/2020 11:10   CUP PACEART REMOTE DEVICE CHECK  Result Date: 05/06/2020 Scheduled remote reviewed. Normal device function.  1 SVT logged 7 minutes 12 seconds w/ rates 160's bpm 1 ATR - same event as above 10 PMT successfully terminated by PMT algorithm Next remote 91 days.   Orson Eva, DO  [facility]  If 7PM-7AM, please contact night-coverage [email or web address] Password [email or web address] 05/31/2020, 4:49 PM   LOS: 9 days

## 2020-06-01 DIAGNOSIS — T50905A Adverse effect of unspecified drugs, medicaments and biological substances, initial encounter: Secondary | ICD-10-CM | POA: Diagnosis not present

## 2020-06-01 DIAGNOSIS — T462X5A Adverse effect of other antidysrhythmic drugs, initial encounter: Secondary | ICD-10-CM

## 2020-06-01 DIAGNOSIS — J984 Other disorders of lung: Secondary | ICD-10-CM | POA: Diagnosis not present

## 2020-06-01 DIAGNOSIS — J9601 Acute respiratory failure with hypoxia: Secondary | ICD-10-CM | POA: Diagnosis not present

## 2020-06-01 DIAGNOSIS — J189 Pneumonia, unspecified organism: Secondary | ICD-10-CM | POA: Diagnosis not present

## 2020-06-01 LAB — CBC WITH DIFFERENTIAL/PLATELET
Abs Immature Granulocytes: 1.18 10*3/uL — ABNORMAL HIGH (ref 0.00–0.07)
Basophils Absolute: 0 10*3/uL (ref 0.0–0.1)
Basophils Relative: 0 %
Eosinophils Absolute: 0 10*3/uL (ref 0.0–0.5)
Eosinophils Relative: 0 %
HCT: 35.6 % — ABNORMAL LOW (ref 39.0–52.0)
Hemoglobin: 11.6 g/dL — ABNORMAL LOW (ref 13.0–17.0)
Lymphocytes Relative: 3 %
Lymphs Abs: 0.6 10*3/uL — ABNORMAL LOW (ref 0.7–4.0)
MCH: 32 pg (ref 26.0–34.0)
MCHC: 32.6 g/dL (ref 30.0–36.0)
MCV: 98.3 fL (ref 80.0–100.0)
Metamyelocytes Relative: 1 %
Monocytes Absolute: 1.4 10*3/uL — ABNORMAL HIGH (ref 0.1–1.0)
Monocytes Relative: 7 %
Myelocytes: 1 %
Neutro Abs: 17.7 10*3/uL — ABNORMAL HIGH (ref 1.7–7.7)
Neutrophils Relative %: 87 %
Platelets: 636 10*3/uL — ABNORMAL HIGH (ref 150–400)
Promyelocytes Relative: 1 %
RBC: 3.62 MIL/uL — ABNORMAL LOW (ref 4.22–5.81)
RDW: 12.7 % (ref 11.5–15.5)
WBC: 20.4 10*3/uL — ABNORMAL HIGH (ref 4.0–10.5)
nRBC: 0 % (ref 0.0–0.2)

## 2020-06-01 LAB — COMPREHENSIVE METABOLIC PANEL
ALT: 20 U/L (ref 0–44)
AST: 17 U/L (ref 15–41)
Albumin: 2.3 g/dL — ABNORMAL LOW (ref 3.5–5.0)
Alkaline Phosphatase: 64 U/L (ref 38–126)
Anion gap: 8 (ref 5–15)
BUN: 41 mg/dL — ABNORMAL HIGH (ref 8–23)
CO2: 29 mmol/L (ref 22–32)
Calcium: 8 mg/dL — ABNORMAL LOW (ref 8.9–10.3)
Chloride: 98 mmol/L (ref 98–111)
Creatinine, Ser: 1.53 mg/dL — ABNORMAL HIGH (ref 0.61–1.24)
GFR, Estimated: 43 mL/min — ABNORMAL LOW (ref >60)
Glucose, Bld: 110 mg/dL — ABNORMAL HIGH (ref 70–99)
Potassium: 4.3 mmol/L (ref 3.5–5.1)
Sodium: 135 mmol/L (ref 135–145)
Total Bilirubin: 0.5 mg/dL (ref 0.3–1.2)
Total Protein: 5.2 g/dL — ABNORMAL LOW (ref 6.5–8.1)

## 2020-06-01 LAB — MAGNESIUM: Magnesium: 2.4 mg/dL (ref 1.7–2.4)

## 2020-06-01 LAB — C-REACTIVE PROTEIN: CRP: 0.6 mg/dL (ref <1.0)

## 2020-06-01 LAB — SEDIMENTATION RATE: Sed Rate: 17 mm/hr — ABNORMAL HIGH (ref 0–16)

## 2020-06-01 NOTE — Progress Notes (Signed)
NAME:  Max Cole, MRN:  [id number], DOB:  1927/11/10, LOS: 10 ADMISSION DATE:  05/22/2020, CONSULTATION DATE:  05/25/20  REFERRING MD:  Tat/ Triad, CHIEF COMPLAINT:  Sob/ resp failure with GG changes   Brief History   89 yowm remote smoker fully vaccinated maint on amiodarone since aug 2021  with several weeks to maybe a month of increasing fatgiue/sob then slt feverish  1 week PTA with neg COVID testing but  with GG changes on Chest CT and deteriorating sats on high flow 02 so PCCM consulted am 11/29 with last dose of amiodarone 11/28.      Past Medical History  Systolic CHF status postBiV PPM, CKD stage III, carotid stenosis, hyperlipidemia,PAF on apixabanand hypertension  Significant Hospital Events   Last dose of amiodarone 11/28 Solumedrol started 11/29 am at 80 q12 h and reduced 12/2 to 40 q 12h   Consults:    Procedures:     Significant Diagnostic Tests:  ESR 11/29  = 18  (pre steroid rx)   Micro Data:  BC x 2 11/26  Neg Resp PCR panel 11/26  neg  Resp PCR panel 11/27 neg Urinary strep  11/27  Neg  Urine legionella 11/27  Neg  Resp PCR panel  11/128  neg   Antimicrobials:   Zmax 11/26 >>> 11/30 Rocephin 11/27 >>> 12/2 Doxy 12/3 >>> 12/05  Interim history/subjective:  Still feels fatigued.  Not having chest pain or much cough.  O2 drops with activity.  Objective   Blood pressure 125/68, pulse 66, temperature 98 F (36.7 C), resp. rate 19, height _0  (1.854 m), weight 81 kg, SpO2 91 %.        Intake/Output Summary (Last 24 hours) at 06/01/2020 1532 Last data filed at 06/01/2020 0500 Gross per 24 hour  Intake -  Output 1000 ml  Net -1000 ml   Filed Weights   05/29/20 0500 05/30/20 0333 06/01/20 0400  Weight: 83.6 kg 84.7 kg 81 kg    Examination:  General - alert Eyes - pupils reactive ENT - no sinus tenderness, no stridor Cardiac - irregular Chest - faint b/l crackles Abdomen - soft, non tender, + bowel sounds Extremities - no cyanosis,  clubbing, or edema Skin - no rashes Neuro - normal strength, moves extremities, follows commands Psych - normal mood and behavior  Resolved Hospital Problem list      Assessment & Plan:   Subacute onset of dyspnea with low grade fever and ground glass opacification on CT chest associated with hypoxia. 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 - DNR - family hoping to transition to care at home - pt and family have realistic expectations about his prognosis  Updated pt's daughter at bedside.  D/w Dr. Carles Collet.    Labs    CMP Latest Ref Rng & Units 06/01/2020 05/31/2020 05/30/2020  Glucose 70 - 99 mg/dL 110(H) 104(H) 120(H)  BUN 8 - 23 mg/dL 41(H) 41(H) 41(H)  Creatinine 0.61 - 1.24 mg/dL 1.53(H) 1.35(H) 1.54(H)  Sodium 135 - 145 mmol/L 135 134(L) 135  Potassium 3.5 - 5.1 mmol/L 4.3 3.9 4.0  Chloride 98 - 111 mmol/L 98 97(L) 97(L)  CO2 22 - 32 mmol/L _1 Calcium 8.9 - 10.3 mg/dL 8.0(L) 8.0(L) 8.2(L)  Total Protein 6.5 - 8.1 g/dL 5.2(L) 5.3(L) 5.6(L)  Total Bilirubin 0.3 -  1.2 mg/dL 0.5 0.3 0.5  Alkaline Phos 38 - 126 U/L 64 65 70  AST 15 - 41 U/L _0 ALT 0 - 44 U/L _1 CBC Latest Ref Rng & Units 06/01/2020 05/31/2020 05/30/2020  WBC 4.0 - 10.5 K/uL 20.4(H) 19.7(H) 16.8(H)  Hemoglobin 13.0 - 17.0 g/dL 11.6(L) 11.4(L) 11.2(L)  Hematocrit 39 - 52 % 35.6(L) 34.5(L) 34.4(L)  Platelets 150 - 400 K/uL 636(H) 652(H) 685(H)    ABG    Component Value Date/Time   PHART 7.436 05/25/2020 0902   PCO2ART 37.9 05/25/2020 0902   PO2ART 68.2 (L) 05/25/2020 0902   HCO3 25.6 05/25/2020 0902   O2SAT 93.3 05/25/2020 0902    Lab Results  Component Value Date   ANA Negative 05/24/2020    Lab Results  Component Value Date   ESRSEDRATE 17 (H) 06/01/2020   Signature:  Chesley Mires, MD [place] Pager -  [phone] 06/01/2020, 4:48 PM

## 2020-06-01 NOTE — Progress Notes (Signed)
PROGRESS NOTE  Max Cole DOB: 02/15/1928 DOA: 05/22/2020 PCP: Asencion Noble, MD  Brief History: 86 y.o.malewith medical history ofsystolic CHF status postBiV PPM, CKD stage III, carotid stenosis, hyperlipidemia,PAF on apixabanand hypertension presentingapproximately 10-day history of generalized weakness, dyspnea on exertion, fevers and chills, and shortness of breath. Apparently, the patient stated that he saw his primary care provider approximately 2 to 3 weeks prior to this admission. He was placed on a prednisone taper. There is no improvement of his symptoms. He took some over-the-counter NyQuil with no improvement. He called his PCP again, the patient was placed on empiric dose of Tamiflu. He continued to have malaise and shortness of breath. As result, he presented for further evaluation. He denied any nausea, vomiting, diarrhea, chest pain, hemoptysis, abdominal pain, dysuria, hematuria, hematochezia, melena. The patient has not started any other new medications except for Robaxin and prednisone. He denies any worsening lower extremity edema, orthopnea, chest pain, headache, sore throat.He has one cat, no exotic pets. No travels. In the emergency department, the patient was febrile up to 100.63F. He was hemodynamically stable with oxygen saturation down to 80% on room air. He was placed on 4 L nasal cannula with saturation 93-94%. CTA chest was negative for PE but showed bilateral widespread groundglass opacities there was cardiomegaly with enlarged central pulmonary arteries. There is a chronic T12 endplate fracture which was stable compared to 2017.  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 -continue IS and flutter valve -wean oxygen for saturation 88-92%   Atypical pneumonia/lobar pneumonia -Urine Legionella antigen--neg -Urine Streptococcus pneumonia antigen--neg -Viral respiratory panel--neg -Continued azithromycin and ceftriaxone -switched to doxy 12/3 -Lactic acid 1.3  FUO -due to amiodarone toxicity--last dose 11/29 -now afebrile -UA neg for pyuria -CT chest as discussed above -blood cultures remain neg -CT abd/pelvis--sm bilateral pleural eff; unchanged GGO; mild fat stranding around sigmoid colon; ur bladder wall thickening  Paroxysmal atrial fibrillation -CHADSVASc = 5 -continue apixaban -currently in sinus -Discontinue amiodarone -continue coreg  CKD stage IIIb -Baseline creatinine 1.5-1.7 -A.m. BMP  Chronic systolic and diastolic CHF -Clinically euvolemic -05/21/2015 echo EF 20 to 25%, grade 1 DD -02/07/2020 echo EF 50-55%, no WMA, grade 1 DD, mild AR -Clinically compensated presently -Daily weights--standing weight on 11/29--179 lb  Left carotid stenosis -Continue apixaban  Hyperlipidemia -Continue statin  BPH -Continue tamsulosin  Goals of Care -given advanced age and multiple comorbidities--requested for goals of care discussions with palliative medicine. Family wants to try for a couple more days to see if we can achieve any further clinical improvements, if not, they would be agreeable to comfort care measures. The patient's priority goal is to go home. If he has no meaningful improvement then would not want to continue full scope care.      Status is: Inpatient  Remains inpatient appropriate because:IV treatments appropriate due to intensity of illness or inability to take  PO   Dispo: The patient is from:Home Anticipated d/c is [place]:[facility] Anticipated d/c date is: 2 days Patient currently is not medically stable to d/c.        Family Communication:Daughter updatedat bedside  12/5  Consultants:none  Code Status: FULL  DVT Prophylaxis:apixaban   Procedures: As Listed in Progress Note Above  Antibiotics: Ceftriaxone 11/27>>12/1 azithro11/26>>12/1 Doxy 12/3>>12/5   Subjective: Patient complains of dyspnea on exertion.  Overall breathing is slowly improving.  Patient denies fevers, chills, headache, chest pain, dyspnea, nausea, vomiting, diarrhea, abdominal pain, dysuria, hematuria, hematochezia, and melena.   Objective: Vitals:   06/01/20 0436 06/01/20 0829 06/01/20 0854 06/01/20 1709  BP: 134/68 125/68  (!) 155/59  Pulse: 74 66  77  Resp: $Remo'20 19  17  'YyjFw$ Temp: 98 F (36.7 C)   97.9 F (36.6 C)  TempSrc:    Oral  SpO2: 90% (!) 89% 91% 95%  Weight:      Height:        Intake/Output Summary (Last 24 hours) at 06/01/2020 1806 Last data filed at 06/01/2020 0500 Gross per 24 hour  Intake --  Output 1000 ml  Net -1000 ml   Weight change:  Exam:   General:  Pt is alert, follows commands appropriately, not in acute distress  HEENT: No icterus, No thrush, No neck mass, /AT  Cardiovascular: RRR, S1/S2, no rubs, no gallops  Respiratory: bibasilar crackles. No wheeze  Abdomen: Soft/+BS, non tender, non distended, no guarding  Extremities: No edema, No lymphangitis, No petechiae, No rashes, no synovitis   Data Reviewed: I have personally reviewed following labs and imaging studies Basic Metabolic Panel: Recent Labs  Lab 05/28/20 0528 05/29/20 0449 05/30/20 0547 05/31/20 0630 06/01/20 0810  NA 134* 135 135 134* 135  K 3.9 4.1 4.0 3.9 4.3  CL 97* 98 97* 97* 98  CO2 $Re'28 29 29 29 29  'JpL$ GLUCOSE 128* 119* 120* 104* 110*  BUN 41* 38* 41* 41* 41*  CREATININE 1.52*  1.51* 1.54* 1.35* 1.53*  CALCIUM 8.2* 8.2* 8.2* 8.0* 8.0*  MG 2.2 2.3 2.3 2.3 2.4   Liver Function Tests: Recent Labs  Lab 05/28/20 0528 05/29/20 0449 05/30/20 0547 05/31/20 0630 06/01/20 0810  AST $Re'22 20 21 19 17  'cJP$ ALT $R'25 24 25 22 20  'tO$ ALKPHOS 62 63 70 65 64  BILITOT 0.5 0.7 0.5 0.3 0.5  PROT 5.5* 5.6* 5.6* 5.3* 5.2*  ALBUMIN 2.1* 2.2* 2.3* 2.3* 2.3*   No results for input(s): LIPASE, AMYLASE in the last 168 hours. No results for input(s): AMMONIA in the last 168 hours. Coagulation Profile: No results for input(s): INR, PROTIME in the last 168 hours. CBC: Recent Labs  Lab 05/28/20 0528 05/29/20 0449 05/30/20 0547 05/31/20 0630 06/01/20 0810  WBC 16.2* 14.5* 16.8* 19.7* 20.4*  NEUTROABS 14.7* 12.7* 14.1* 16.6* 17.7*  HGB 10.0* 10.4* 11.2* 11.4* 11.6*  HCT 29.9* 31.4* 34.4* 34.5* 35.6*  MCV 97.1 96.9 97.5 97.2 98.3  PLT 574* 651* 685* 652* 636*   Cardiac Enzymes: No results for input(s): CKTOTAL, CKMB, CKMBINDEX, TROPONINI in the last 168 hours. BNP: Invalid input(s): POCBNP CBG: No results for input(s): GLUCAP in the last 168 hours. HbA1C: No results for input(s): HGBA1C in the last 72 hours. Urine analysis:    Component Value Date/Time   COLORURINE YELLOW 05/22/2020 [place] 05/22/2020 1041   LABSPEC 1.015 05/22/2020 1041   PHURINE 6.0 05/22/2020 1041   GLUCOSEU NEGATIVE 05/22/2020 1041   GLUCOSEU neg 02/26/2009 0000   HGBUR NEGATIVE 05/22/2020 [place] 05/22/2020 [place] 05/22/2020 1041   PROTEINUR 30 (A) 05/22/2020 1041   NITRITE NEGATIVE 05/22/2020 [place] 05/22/2020 1041  Sepsis Labs: $RemoveBefo'@LABRCNTIP'PcqqAxHlqsk$ (procalcitonin:4,lacticidven:4) ) Recent Results (from the past 240 hour(s))  Resp Panel by RT-PCR (Flu A&B, Covid)     Status: None   Collection Time: 05/23/20  6:40 PM  Result Value Ref Range Status   SARS Coronavirus 2 by RT PCR NEGATIVE NEGATIVE Final    Comment:  (NOTE) SARS-CoV-2 target nucleic acids are NOT DETECTED.  The SARS-CoV-2 RNA is generally detectable in upper respiratory specimens during the acute phase of infection. The lowest concentration of SARS-CoV-2 viral copies this assay can detect is 138 copies/mL. A negative result does not preclude SARS-Cov-2 infection and should not be used as the sole basis for treatment or other patient management decisions. A negative result may occur with  improper specimen collection/handling, submission of specimen other than nasopharyngeal swab, presence of viral mutation(s) within the areas targeted by this assay, and inadequate number of viral copies(<138 copies/mL). A negative result must be combined with clinical observations, patient history, and epidemiological information. The expected result is Negative.  Fact Sheet for Patients:  EntrepreneurPulse.com.au  Fact Sheet for Healthcare Providers:  IncredibleEmployment.be  This test is no t yet approved or cleared by the [place] FDA and  has been authorized for detection and/or diagnosis of SARS-CoV-2 by FDA under an Emergency Use Authorization (EUA). This EUA will remain  in effect (meaning this test can be used) for the duration of the COVID-19 declaration under Section 564(b)(1) of the Act, 21 U.S.C.section 360bbb-3(b)(1), unless the authorization is terminated  or revoked sooner.       Influenza A by PCR NEGATIVE NEGATIVE Final   Influenza B by PCR NEGATIVE NEGATIVE Final    Comment: (NOTE) The Xpert Xpress SARS-CoV-2/FLU/RSV plus assay is intended as an aid in the diagnosis of influenza from Nasopharyngeal swab specimens and should not be used as a sole basis for treatment. Nasal washings and aspirates are unacceptable for Xpert Xpress SARS-CoV-2/FLU/RSV testing.  Fact Sheet for Patients: EntrepreneurPulse.com.au  Fact Sheet for Healthcare  Providers: IncredibleEmployment.be  This test is not yet approved or cleared by the [place] FDA and has been authorized for detection and/or diagnosis of SARS-CoV-2 by FDA under an Emergency Use Authorization (EUA). This EUA will remain in effect (meaning this test can be used) for the duration of the COVID-19 declaration under Section 564(b)(1) of the Act, 21 U.S.C. section 360bbb-3(b)(1), unless the authorization is terminated or revoked.  Performed at [facility], [address] [place], [place] [address]   Respiratory Panel by PCR     Status: None   Collection Time: 05/24/20 10:04 AM   Specimen: Nasopharyngeal Swab; Respiratory  Result Value Ref Range Status   Adenovirus NOT DETECTED NOT DETECTED Final   Coronavirus 229E NOT DETECTED NOT DETECTED Final    Comment: (NOTE) The Coronavirus on the Respiratory Panel, DOES NOT test for the novel  Coronavirus (2019 nCoV)    Coronavirus HKU1 NOT DETECTED NOT DETECTED Final   Coronavirus NL63 NOT DETECTED NOT DETECTED Final   Coronavirus OC43 NOT DETECTED NOT DETECTED Final   Metapneumovirus NOT DETECTED NOT DETECTED Final   Rhinovirus / Enterovirus NOT DETECTED NOT DETECTED Final   Influenza A NOT DETECTED NOT DETECTED Final   Influenza B NOT DETECTED NOT DETECTED Final   Parainfluenza Virus 1 NOT DETECTED NOT DETECTED Final   Parainfluenza Virus 2 NOT DETECTED NOT DETECTED Final   Parainfluenza Virus 3 NOT DETECTED NOT DETECTED Final   Parainfluenza Virus 4 NOT DETECTED NOT DETECTED Final   Respiratory Syncytial Virus  NOT DETECTED NOT DETECTED Final   Bordetella pertussis NOT DETECTED NOT DETECTED Final   Chlamydophila pneumoniae NOT DETECTED NOT DETECTED Final   Mycoplasma pneumoniae NOT DETECTED NOT DETECTED Final    Comment: Performed at [facility], [place] [address], [place], [place] [address]     Scheduled Meds: . apixaban  2.5 mg Oral BID  . carvedilol  6.25 mg Oral BID WC  . ferrous  sulfate  325 mg Oral Q breakfast  . furosemide  20 mg Oral Daily  . guaiFENesin  600 mg Oral BID  . ipratropium-albuterol  3 mL Nebulization BID  . methocarbamol  500 mg Oral TID  . methylPREDNISolone (SOLU-MEDROL) injection  40 mg Intravenous Q12H  . polyvinyl alcohol  1 drop Both Eyes TID  . pravastatin  20 mg Oral QPM  . sodium chloride flush  3 mL Intravenous Q12H  . sodium chloride flush  3 mL Intravenous Q12H  . tamsulosin  0.4 mg Oral Daily   Continuous Infusions: . sodium chloride      Procedures/Studies: CT ABDOMEN PELVIS WO CONTRAST  Result Date: 05/24/2020 CLINICAL DATA:  Generalized weakness and fever of unknown origin. EXAM: CT ABDOMEN AND PELVIS WITHOUT CONTRAST TECHNIQUE: Multidetector CT imaging of the abdomen and pelvis was performed following the standard protocol without IV contrast. COMPARISON:  Chest CT dated 05/22/2020 and renal ultrasound dated 06/02/2009. FINDINGS: Lower chest: Cardiomegaly is partially imaged. There are small bilateral pleural effusions which appear similar to prior exam. Diffuse bilateral ground-glass opacities appear unchanged from prior exam. Hepatobiliary: No focal liver abnormality is seen. No gallstones, gallbladder wall thickening, or biliary dilatation. Pancreas: Unremarkable. No pancreatic ductal dilatation or surrounding inflammatory changes. Spleen: Normal in size without focal abnormality. Adrenals/Urinary Tract: Adrenal glands are unremarkable. Bilateral renal cysts are noted, measuring up to 2.1 cm on the right and 1.8 cm on the left. Kidneys are normal, without renal calculi, focal lesion, or hydronephrosis. Multiple urinary bladder diverticula are noted and there is associated urinary bladder wall thickening, likely reflecting bladder outlet obstruction given the patient's large prostate. Stomach/Bowel: Stomach is within normal limits. Colonic diverticula are noted, particularly in the sigmoid colon. There is a mild fat stranding  surrounding the proximal sigmoid colon (series 5 images 42-49) which may reflect acute diverticulitis. No evidence of abscess or fistula formation. No pericecal inflammatory changes are noted to suggest acute appendicitis. No evidence of bowel obstruction. Vascular/Lymphatic: Aortic atherosclerosis. No enlarged abdominal or pelvic lymph nodes. Reproductive: The prostate is enlarged, measuring 6.5 cm in transverse dimension, and indents the bladder. Other: No abdominal wall hernia or abnormality. No abdominopelvic ascites. Musculoskeletal: Chronic appearing wedge deformities are seen at T12 and L4 with less than 25% height loss. Insert degenerative IMPRESSION: 1. Mild fat stranding surrounding the proximal sigmoid colon may reflect acute diverticulitis. No evidence of complication. 2. Multiple urinary bladder diverticula with associated urinary bladder wall thickening, likely reflecting bladder outlet obstruction given the patient's large prostate. 3. Small bilateral pleural effusions with diffuse bilateral ground-glass opacities appear unchanged from prior exam. Aortic Atherosclerosis (ICD10-I70.0). Electronically Signed   By: Zerita Boers M.D.   On: 05/24/2020 16:46   CT Angio Chest PE W and/or Wo Contrast  Result Date: 05/22/2020 CLINICAL DATA:  86 year old male with chest pain. Weakness. Shortness of breath. Abnormal D-dimer. EXAM: CT ANGIOGRAPHY CHEST WITH CONTRAST TECHNIQUE: Multidetector CT imaging of the chest was performed using the standard protocol during bolus administration of intravenous contrast. Multiplanar CT image reconstructions and MIPs were  obtained to evaluate the vascular anatomy. CONTRAST:  45mL OMNIPAQUE IOHEXOL 350 MG/ML SOLN COMPARISON:  Portable chest earlier today. Chest radiographs 07/09/2015. Neck CT, CTA 07/03/2017 and earlier. FINDINGS: Cardiovascular: Good contrast bolus timing in the pulmonary arterial tree. No focal filling defect identified in the pulmonary arteries to  suggest acute pulmonary embolism. Mild to moderate enlargement of central pulmonary arteries relative to the aorta (series 3, image 147). Calcified aortic atherosclerosis. Calcified coronary artery atherosclerosis. Cardiomegaly. Left chest cardiac pacemaker type device. No pericardial effusion. Mediastinum/Nodes: Negative.  No lymphadenopathy. Lungs/Pleura: Major airways are patent. Widespread although fairly symmetric peripheral, peribronchial, and also in the lung apices this is new or progressed since 2019. No consolidation. Trace layering pleural fluid greater on the left. And dependent pulmonary ground-glass opacity in both lungs Upper Abdomen: Negative for age visible upper abdominal viscera Musculoskeletal: Chronic T12 superior endplate compression suspected to be stable since 2017 radiographs. No acute osseous abnormality identified. Review of the MIP images confirms the above findings. IMPRESSION: 1. Negative for pulmonary embolus. 2. Widespread abnormal although nonspecific pulmonary ground-glass opacity with trace layering pleural fluid. Acute viral/atypical respiratory infection not excluded. Chronic interstitial lung disease is also a consideration. 3. Cardiomegaly with mild to moderate enlargement of the central pulmonary arteries raising the possibility of pulmonary artery hypertension. Aortic Atherosclerosis (ICD10-I70.0). Electronically Signed   By: Genevie Ann M.D.   On: 05/22/2020 14:38   DG CHEST PORT 1 VIEW  Result Date: 05/31/2020 CLINICAL DATA:  Malaise and shortness of breath. Ten day history of generalized weakness, dyspnea on exertion, fever and chills, shortness of breath. EXAM: PORTABLE CHEST 1 VIEW COMPARISON:  Chest x-rays dated 05/29/2020 and 05/25/2020 FINDINGS: Persistent bilateral ground-glass airspace opacities, not significantly changed compared to the recent studies of 05/29/2020 and 05/25/2020, perhaps slightly worsened on the RIGHT compared to the earlier chest x-ray of  05/22/2020, corresponding to the patchy ground-glass opacities demonstrated on chest CT of 05/22/2020. No new lung findings. No pleural effusion or pneumothorax is seen. Stable cardiomegaly. LEFT chest wall pacemaker/ICD apparatus is stable. Osseous structures about the chest are unremarkable. IMPRESSION: Persistent bilateral ground-glass airspace opacities, not significantly changed. Differential includes atypical pneumonias such as viral or fungal, interstitial pneumonias, edema related to volume overload/CHF, and chronic interstitial diseases. Favor pneumonia versus asymmetric pulmonary edema superimposed on chronic interstitial lung disease. Electronically Signed   By: Franki Cabot M.D.   On: 05/31/2020 05:54   DG CHEST PORT 1 VIEW  Result Date: 05/29/2020 CLINICAL DATA:  Acute on chronic respiratory failure with hypoxia. EXAM: PORTABLE CHEST 1 VIEW COMPARISON:  05/25/2020 FINDINGS: The cardiac silhouette remains enlarged, and a 3 lead pacemaker remains in place. Mixed interstitial and patchy airspace opacities bilaterally have not significantly changed. No sizable pleural effusion or pneumothorax is identified. IMPRESSION: Unchanged bilateral lung opacities which could reflect infection or edema, potentially superimposed on chronic interstitial lung disease. Electronically Signed   By: Logan Bores M.D.   On: 05/29/2020 10:26   DG CHEST PORT 1 VIEW  Result Date: 05/25/2020 CLINICAL DATA:  Shortness of breath EXAM: PORTABLE CHEST 1 VIEW COMPARISON:  05/22/2020 FINDINGS: Likely chronic interstitial lung changes. Patchy superimposed increased density. No significant pleural effusion. No pneumothorax. Stable cardiomediastinal contours. Left chest wall pacemaker. IMPRESSION: Patchy increased density superimposed on likely chronic interstitial lung disease. This may reflect superimposed pneumonia or edema. Electronically Signed   By: Macy Mis M.D.   On: 05/25/2020 08:27   DG Chest Port 1  View  Result Date: 05/22/2020  CLINICAL DATA:  Weakness and shortness of breath. EXAM: PORTABLE CHEST 1 VIEW COMPARISON:  Chest x-ray 07/09/2015. FINDINGS: AICD in stable position. Cardiomegaly with mild bilateral interstitial prominence suggesting interstitial edema. Pneumonitis cannot be excluded. No pleural effusion or pneumothorax. IMPRESSION: AICD in stable position. Cardiomegaly with mild bilateral interstitial prominence suggesting interstitial edema. Pneumonitis cannot be excluded. Electronically Signed   By: Marcello Moores  Register   On: 05/22/2020 11:10   CUP PACEART REMOTE DEVICE CHECK  Result Date: 05/06/2020 Scheduled remote reviewed. Normal device function.  1 SVT logged 7 minutes 12 seconds w/ rates 160's bpm 1 ATR - same event as above 10 PMT successfully terminated by PMT algorithm Next remote 91 days.   Orson Eva, DO  [facility]  If 7PM-7AM, please contact night-coverage [email or web address] Password [email or web address] 06/01/2020, 6:06 PM   LOS: 10 days

## 2020-06-01 NOTE — TOC Initial Note (Signed)
Transition of Care [facility]) - Initial/Assessment Note    Patient Details  Name: Max Cole MRN: [id number] Date of Birth: July 11, 1927  Transition of Care [facility]) CM/SW Contact:    Iona Beard, [place] Phone Number: 06/01/2020, 9:10 PM  Clinical Narrative:                 Pt admitted for respiratory failure with hypoxia. CSW spoke with Lovell Sheehan (Daughter) [phone] to complete pt assessment. Ms. Olena Heckle states that pt lives with his wife and has been independent with ADLs and driving before admission. Ms. Olena Heckle states that currently he is not independent. Ms. Olena Heckle states that if the pt returns home they will need DME such as a hospital bed, wheel chair, and home O2 and oxidizer. Currently pt is on 5L O2. Ms. Olena Heckle states that before admission pt was going to the gym and doing all the year work so things have drastically changed. Ms. Olena Heckle states that the family is unsure if pt should go to SNF or go home with [facility] and DME equipment. Ms. Olena Heckle would like for the family to sit down with Palliative again and a member of TOC. CSW informed Ms. Olena Heckle that she would leave a note for first shift TOC to reach out about setting up a time to meet. TOC to follow.     Barriers to Discharge: Continued Medical Work up   Patient Goals and CMS Choice Patient states their goals for this hospitalization and ongoing recovery are:: Pt to gain strength back   Choice offered to / list presented to : Adult Children  Expected Discharge Plan and Services     Discharge Planning Services: CM Consult Post Acute Care Choice:  (Family unsure) Living arrangements for the past 2 months: Single Family Home                                      Prior Living Arrangements/Services Living arrangements for the past 2 months: Single Family Home Lives with:: Spouse Patient language and need for interpreter reviewed:: Yes Do you feel safe going back to the place where you live?: Yes             Criminal Activity/Legal Involvement Pertinent to Current Situation/Hospitalization: No - Comment as needed  Activities of Daily Living Home Assistive Devices/Equipment: None ADL Screening (condition at time of admission) Patient's cognitive ability adequate to safely complete daily activities?: Yes Is the patient deaf or have difficulty hearing?: No Does the patient have difficulty seeing, even when wearing glasses/contacts?: No Does the patient have difficulty concentrating, remembering, or making decisions?: No Patient able to express need for assistance with ADLs?: Yes Does the patient have difficulty dressing or bathing?: No Independently performs ADLs?: Yes (appropriate for developmental age) Does the patient have difficulty walking or climbing stairs?: No Weakness of Legs: Both Weakness of Arms/Hands: None  Permission Sought/Granted                  Emotional Assessment         Alcohol / Substance Use: Not Applicable Psych Involvement: No (comment)  Admission diagnosis:  Respiratory failure with hypoxia ([place]) [J96.91] Patient Active Problem List   Diagnosis Date Noted  . Lobar pneumonia ([place]) 05/23/2020  . Respiratory failure with hypoxia ([place]) 05/22/2020  . Chronic a-fib ([place]) 05/22/2020  . Pneumonia in infectious disease---??? Atypical Pneumonia 05/22/2020  . Systolic and diastolic CHF, chronic (  [place]) 02/06/2020  . Chronic systolic heart failure ([place]) 07/03/2015  . Pacemaker 07/03/2015  . Syncope 05/20/2015  . Nonischemic cardiomyopathy ([place]) 05/20/2015  . Bradycardia   . Carotid stenosis, bilateral 11/19/2013  . Laboratory test 10/15/2011  . Hyperlipidemia 10/15/2011  . Cerebrovascular disease   . Left bundle branch block   . Cardiomyopathy, nonischemic ([place])   . CKD (chronic kidney disease) stage 3, GFR 30-59 ml/min (HCC)    PCP:  Asencion Noble, MD Pharmacy:   [facility], [place]  [address] Phone: [phone] Fax: [phone]     Social Determinants of Health (SDOH) Interventions    Readmission Risk Interventions No flowsheet data found.

## 2020-06-02 ENCOUNTER — Ambulatory Visit (HOSPITAL_COMMUNITY): Payer: Medicare Other | Admitting: Physical Therapy

## 2020-06-02 LAB — CBC WITH DIFFERENTIAL/PLATELET
Abs Immature Granulocytes: 1.01 10*3/uL — ABNORMAL HIGH (ref 0.00–0.07)
Basophils Absolute: 0.2 10*3/uL — ABNORMAL HIGH (ref 0.0–0.1)
Basophils Relative: 1 %
Eosinophils Absolute: 0 10*3/uL (ref 0.0–0.5)
Eosinophils Relative: 0 %
HCT: 38 % — ABNORMAL LOW (ref 39.0–52.0)
Hemoglobin: 12.4 g/dL — ABNORMAL LOW (ref 13.0–17.0)
Immature Granulocytes: 5 %
Lymphocytes Relative: 3 %
Lymphs Abs: 0.6 10*3/uL — ABNORMAL LOW (ref 0.7–4.0)
MCH: 32.2 pg (ref 26.0–34.0)
MCHC: 32.6 g/dL (ref 30.0–36.0)
MCV: 98.7 fL (ref 80.0–100.0)
Monocytes Absolute: 1 10*3/uL (ref 0.1–1.0)
Monocytes Relative: 5 %
Neutro Abs: 19.7 10*3/uL — ABNORMAL HIGH (ref 1.7–7.7)
Neutrophils Relative %: 86 %
Platelets: 629 10*3/uL — ABNORMAL HIGH (ref 150–400)
RBC: 3.85 MIL/uL — ABNORMAL LOW (ref 4.22–5.81)
RDW: 12.9 % (ref 11.5–15.5)
WBC: 22.5 10*3/uL — ABNORMAL HIGH (ref 4.0–10.5)
nRBC: 0 % (ref 0.0–0.2)

## 2020-06-02 LAB — BASIC METABOLIC PANEL
Anion gap: 9 (ref 5–15)
BUN: 43 mg/dL — ABNORMAL HIGH (ref 8–23)
CO2: 29 mmol/L (ref 22–32)
Calcium: 8.1 mg/dL — ABNORMAL LOW (ref 8.9–10.3)
Chloride: 97 mmol/L — ABNORMAL LOW (ref 98–111)
Creatinine, Ser: 1.53 mg/dL — ABNORMAL HIGH (ref 0.61–1.24)
GFR, Estimated: 43 mL/min — ABNORMAL LOW (ref >60)
Glucose, Bld: 191 mg/dL — ABNORMAL HIGH (ref 70–99)
Potassium: 4.1 mmol/L (ref 3.5–5.1)
Sodium: 135 mmol/L (ref 135–145)

## 2020-06-02 LAB — ALPHA-1-ANTITRYPSIN: A-1 Antitrypsin, Ser: 159 mg/dL (ref 101–187)

## 2020-06-02 NOTE — Plan of Care (Signed)

## 2020-06-02 NOTE — Progress Notes (Signed)
Palliative-   Noted per TOC note from last night that patient's family wishes to meet again with Palliative and TOC- left message for Lovell Sheehan- requesting return call.  Mariana Kaufman, AGNP-C Palliative Medicine  No charge note

## 2020-06-02 NOTE — Progress Notes (Signed)
PROGRESS NOTE  Max Cole FVC:[id number] DOB: 05-12-1928 DOA: 05/22/2020 PCP: Asencion Noble, MD  Brief History: 86 y.o.malewith medical history ofsystolic CHF status postBiV PPM, CKD stage III, carotid stenosis, hyperlipidemia,PAF on apixabanand hypertension presentingapproximately 10-day history of generalized weakness, dyspnea on exertion, fevers and chills, and shortness of breath. Apparently, the patient stated that he saw his primary care provider approximately 2 to 3 weeks prior to this admission. He was placed on a prednisone taper. There is no improvement of his symptoms. He took some over-the-counter NyQuil with no improvement. He called his PCP again, the patient was placed on empiric dose of Tamiflu. He continued to have malaise and shortness of breath. As result, he presented for further evaluation. He denied any nausea, vomiting, diarrhea, chest pain, hemoptysis, abdominal pain, dysuria, hematuria, hematochezia, melena. The patient has not started any other new medications except for Robaxin and prednisone. He denies any worsening lower extremity edema, orthopnea, chest pain, headache, sore throat.He has one cat, no exotic pets. No travels. In the emergency department, the patient was febrile up to 100.60F. He was hemodynamically stable with oxygen saturation down to 80% on room air. He was placed on 4 L nasal cannula with saturation 93-94%. CTA chest was negative for PE but showed bilateral widespread groundglass opacities there was cardiomegaly with enlarged central pulmonary arteries. There is a chronic T12 endplate fracture which was stable compared to 2017.  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 -continue IS and flutter valve -wean oxygen for saturation 88-92%   Atypical pneumonia/lobar pneumonia -Urine Legionella antigen--neg -Urine Streptococcus pneumonia antigen--neg -Viral respiratory panel--neg -Continuedazithromycin and ceftriaxone -switched to doxy 12/3 -Lactic acid 1.3  FUO -due to amiodarone toxicity--last dose 11/29 -now afebrile -UA neg for pyuria -CT chest as discussed above -blood cultures remain neg -CT abd/pelvis--sm bilateral pleural eff; unchanged GGO; mild fat stranding around sigmoid colon; ur bladder wall thickening  Paroxysmal atrial fibrillation -CHADSVASc = 5 -continue apixaban -currently in sinus -Discontinue amiodarone -continue coreg  CKD stage IIIb -Baseline creatinine 1.5-1.7 -A.m. BMP  Chronic systolic and diastolic CHF -Clinically euvolemic -05/21/2015 echo EF 20 to 25%, grade 1 DD -02/07/2020 echo EF 50-55%, no WMA, grade 1 DD, mild AR -Clinically compensated presently -Daily weights--standing weight on 11/29--179 lb -continue po lasix   Left carotid stenosis -Continue apixaban  Hyperlipidemia -Continue statin  BPH -Continue tamsulosin  Goals of Care -given advanced age and multiple comorbidities--requested for goals of care discussions with palliative medicine. Family wants to try for a couple more days to see if we can achieve any further clinical improvements, if not, they would be agreeable to comfort care measures. The patient's priority goal is to go home. If he has no meaningful improvement then would not want to continue full scope care.     Status is: Inpatient  Remains inpatient appropriate because:IV treatments appropriate due  to intensity of illness or inability to take PO   Dispo: The patient is from:Home Anticipated d/c is to:SNF Anticipated d/c date is: 12/8 Patient currently is not medically stable to d/c.        Family Communication:Daughter updatedat bedside12/7  Consultants:none  Code Status: FULL  DVT Prophylaxis:apixaban   Procedures: As Listed in Progress Note Above  Antibiotics: Ceftriaxone 11/27>>12/1 azithro11/26>>12/1 Doxy 12/3>>12/5   Subjective: Patient denies fevers, chills, headache, chest pain,  nausea, vomiting, diarrhea, abdominal pain, dysuria, hematuria, hematochezia, and melena.  He has dyspnea on exertion.   Objective: Vitals:   06/02/20 1252 06/02/20 1400 06/02/20 1600 06/02/20 1710  BP: (!) 141/74 (!) 141/74  (!) 153/89  Pulse: 74 74  79  Resp: 20 18    Temp: (!) 97.5 F (36.4 C) (!) 97.5 F (36.4 C)  97.8 F (36.6 C)  TempSrc: Oral Oral  Oral  SpO2: 96% 92% 91% 92%  Weight:      Height:        Intake/Output Summary (Last 24 hours) at 06/02/2020 1800 Last data filed at 06/02/2020 8546 Gross per 24 hour  Intake 250 ml  Output 700 ml  Net -450 ml   Weight change: -0.8 kg Exam:   General:  Pt is alert, follows commands appropriately, not in acute distress  HEENT: No icterus, No thrush, No neck mass, [place]/AT  Cardiovascular: RRR, S1/S2, no rubs, no gallops  Respiratory: bilateral crackles. No wheeze  Abdomen: Soft/+BS, non tender, non distended, no guarding  Extremities: No edema, No lymphangitis, No petechiae, No rashes, no synovitis   Data Reviewed: I have personally reviewed following labs and imaging studies Basic Metabolic Panel: Recent Labs  Lab 05/28/20 0528 05/28/20 0528 05/29/20 0449 05/30/20 0547 05/31/20 0630 06/01/20 0810 06/02/20 1100  NA 134*   < > 135 135 134* 135 135  K 3.9   < > 4.1 4.0 3.9 4.3 4.1  CL 97*   < > 98 97* 97* 98 97*  CO2 28   < > $R'29  29 29 29 29  'FL$ GLUCOSE 128*   < > 119* 120* 104* 110* 191*  BUN 41*   < > 38* 41* 41* 41* 43*  CREATININE 1.52*   < > 1.51* 1.54* 1.35* 1.53* 1.53*  CALCIUM 8.2*   < > 8.2* 8.2* 8.0* 8.0* 8.1*  MG 2.2  --  2.3 2.3 2.3 2.4  --    < > = values in this interval not displayed.   Liver Function Tests: Recent Labs  Lab 05/28/20 0528 05/29/20 0449 05/30/20 0547 05/31/20 0630 06/01/20 0810  AST $Re'22 20 21 19 17  'xZg$ ALT $R'25 24 25 22 20  'xM$ ALKPHOS 62 63 70 65 64  BILITOT 0.5 0.7 0.5 0.3 0.5  PROT 5.5* 5.6* 5.6* 5.3* 5.2*  ALBUMIN 2.1* 2.2* 2.3* 2.3* 2.3*   No results for input(s): LIPASE, AMYLASE in the last 168 hours. No results for input(s): AMMONIA in the last 168 hours. Coagulation Profile: No results for input(s): INR, PROTIME in the last 168 hours. CBC: Recent Labs  Lab 05/29/20 0449 05/30/20 0547 05/31/20 0630 06/01/20 0810 06/02/20 1100  WBC 14.5* 16.8* 19.7* 20.4* 22.5*  NEUTROABS 12.7* 14.1* 16.6* 17.7* 19.7*  HGB 10.4* 11.2* 11.4* 11.6* 12.4*  HCT 31.4* 34.4* 34.5* 35.6* 38.0*  MCV 96.9 97.5 97.2 98.3 98.7  PLT 651* 685* 652* 636* 629*   Cardiac Enzymes: No results for input(s): CKTOTAL, CKMB, CKMBINDEX, TROPONINI in the last 168 hours. BNP: Invalid input(s): POCBNP CBG: No results for input(s): GLUCAP in the last 168 hours. HbA1C: No results for input(s): HGBA1C in the last 72 hours. Urine analysis:    Component Value Date/Time   COLORURINE YELLOW 05/22/2020 [place] 05/22/2020 1041  LABSPEC 1.015 05/22/2020 1041   PHURINE 6.0 05/22/2020 [place] 05/22/2020 1041   GLUCOSEU neg 02/26/2009 0000   HGBUR NEGATIVE 05/22/2020 [place] 05/22/2020 [place] 05/22/2020 1041   PROTEINUR 30 (A) 05/22/2020 1041   NITRITE NEGATIVE 05/22/2020 1041   LEUKOCYTESUR NEGATIVE 05/22/2020 1041   Sepsis Labs: $RemoveBefo'@LABRCNTIP'JHxWSHjyouM$ (procalcitonin:4,lacticidven:4) ) Recent Results (from the past 240 hour(s))  Resp Panel by  RT-PCR (Flu A&B, Covid)     Status: None   Collection Time: 05/23/20  6:40 PM  Result Value Ref Range Status   SARS Coronavirus 2 by RT PCR NEGATIVE NEGATIVE Final    Comment: (NOTE) SARS-CoV-2 target nucleic acids are NOT DETECTED.  The SARS-CoV-2 RNA is generally detectable in upper respiratory specimens during the acute phase of infection. The lowest concentration of SARS-CoV-2 viral copies this assay can detect is 138 copies/mL. A negative result does not preclude SARS-Cov-2 infection and should not be used as the sole basis for treatment or other patient management decisions. A negative result may occur with  improper specimen collection/handling, submission of specimen other than nasopharyngeal swab, presence of viral mutation(s) within the areas targeted by this assay, and inadequate number of viral copies(<138 copies/mL). A negative result must be combined with clinical observations, patient history, and epidemiological information. The expected result is Negative.  Fact Sheet for Patients:  EntrepreneurPulse.com.au  Fact Sheet for Healthcare Providers:  IncredibleEmployment.be  This test is no t yet approved or cleared by the [place] FDA and  has been authorized for detection and/or diagnosis of SARS-CoV-2 by FDA under an Emergency Use Authorization (EUA). This EUA will remain  in effect (meaning this test can be used) for the duration of the COVID-19 declaration under Section 564(b)(1) of the Act, 21 U.S.C.section 360bbb-3(b)(1), unless the authorization is terminated  or revoked sooner.       Influenza A by PCR NEGATIVE NEGATIVE Final   Influenza B by PCR NEGATIVE NEGATIVE Final    Comment: (NOTE) The Xpert Xpress SARS-CoV-2/FLU/RSV plus assay is intended as an aid in the diagnosis of influenza from Nasopharyngeal swab specimens and should not be used as a sole basis for treatment. Nasal washings and aspirates are  unacceptable for Xpert Xpress SARS-CoV-2/FLU/RSV testing.  Fact Sheet for Patients: EntrepreneurPulse.com.au  Fact Sheet for Healthcare Providers: IncredibleEmployment.be  This test is not yet approved or cleared by the [place] FDA and has been authorized for detection and/or diagnosis of SARS-CoV-2 by FDA under an Emergency Use Authorization (EUA). This EUA will remain in effect (meaning this test can be used) for the duration of the COVID-19 declaration under Section 564(b)(1) of the Act, 21 U.S.C. section 360bbb-3(b)(1), unless the authorization is terminated or revoked.  Performed at [facility], [address], [place], [place] [address]   Respiratory Panel by PCR     Status: None   Collection Time: 05/24/20 10:04 AM   Specimen: Nasopharyngeal Swab; Respiratory  Result Value Ref Range Status   Adenovirus NOT DETECTED NOT DETECTED Final   Coronavirus 229E NOT DETECTED NOT DETECTED Final    Comment: (NOTE) The Coronavirus on the Respiratory Panel, DOES NOT test for the novel  Coronavirus (2019 nCoV)    Coronavirus HKU1 NOT DETECTED NOT DETECTED Final   Coronavirus NL63 NOT DETECTED NOT DETECTED Final   Coronavirus OC43 NOT DETECTED NOT DETECTED Final   Metapneumovirus NOT DETECTED NOT DETECTED Final   Rhinovirus / Enterovirus NOT DETECTED NOT DETECTED Final  Influenza A NOT DETECTED NOT DETECTED Final   Influenza B NOT DETECTED NOT DETECTED Final   Parainfluenza Virus 1 NOT DETECTED NOT DETECTED Final   Parainfluenza Virus 2 NOT DETECTED NOT DETECTED Final   Parainfluenza Virus 3 NOT DETECTED NOT DETECTED Final   Parainfluenza Virus 4 NOT DETECTED NOT DETECTED Final   Respiratory Syncytial Virus NOT DETECTED NOT DETECTED Final   Bordetella pertussis NOT DETECTED NOT DETECTED Final   Chlamydophila pneumoniae NOT DETECTED NOT DETECTED Final   Mycoplasma pneumoniae NOT DETECTED NOT DETECTED Final    Comment: Performed at [facility], [place] [address], [place], [place] [address]     Scheduled Meds: . apixaban  2.5 mg Oral BID  . carvedilol  6.25 mg Oral BID WC  . ferrous sulfate  325 mg Oral Q breakfast  . furosemide  20 mg Oral Daily  . guaiFENesin  600 mg Oral BID  . ipratropium-albuterol  3 mL Nebulization BID  . methocarbamol  500 mg Oral TID  . methylPREDNISolone (SOLU-MEDROL) injection  40 mg Intravenous Q12H  . polyvinyl alcohol  1 drop Both Eyes TID  . pravastatin  20 mg Oral QPM  . sodium chloride flush  3 mL Intravenous Q12H  . sodium chloride flush  3 mL Intravenous Q12H  . tamsulosin  0.4 mg Oral Daily   Continuous Infusions: . sodium chloride      Procedures/Studies: CT ABDOMEN PELVIS WO CONTRAST  Result Date: 05/24/2020 CLINICAL DATA:  Generalized weakness and fever of unknown origin. EXAM: CT ABDOMEN AND PELVIS WITHOUT CONTRAST TECHNIQUE: Multidetector CT imaging of the abdomen and pelvis was performed following the standard protocol without IV contrast. COMPARISON:  Chest CT dated 05/22/2020 and renal ultrasound dated 06/02/2009. FINDINGS: Lower chest: Cardiomegaly is partially imaged. There are small bilateral pleural effusions which appear similar to prior exam. Diffuse bilateral ground-glass opacities appear unchanged from prior exam. Hepatobiliary: No focal liver abnormality is seen. No gallstones, gallbladder wall thickening, or biliary dilatation. Pancreas: Unremarkable. No pancreatic ductal dilatation or surrounding inflammatory changes. Spleen: Normal in size without focal abnormality. Adrenals/Urinary Tract: Adrenal glands are unremarkable. Bilateral renal cysts are noted, measuring up to 2.1 cm on the right and 1.8 cm on the left. Kidneys are normal, without renal calculi, focal lesion, or hydronephrosis. Multiple urinary bladder diverticula are noted and there is associated urinary bladder wall thickening, likely reflecting bladder outlet obstruction given the patient's large prostate.  Stomach/Bowel: Stomach is within normal limits. Colonic diverticula are noted, particularly in the sigmoid colon. There is a mild fat stranding surrounding the proximal sigmoid colon (series 5 images 42-49) which may reflect acute diverticulitis. No evidence of abscess or fistula formation. No pericecal inflammatory changes are noted to suggest acute appendicitis. No evidence of bowel obstruction. Vascular/Lymphatic: Aortic atherosclerosis. No enlarged abdominal or pelvic lymph nodes. Reproductive: The prostate is enlarged, measuring 6.5 cm in transverse dimension, and indents the bladder. Other: No abdominal wall hernia or abnormality. No abdominopelvic ascites. Musculoskeletal: Chronic appearing wedge deformities are seen at T12 and L4 with less than 25% height loss. Insert degenerative IMPRESSION: 1. Mild fat stranding surrounding the proximal sigmoid colon may reflect acute diverticulitis. No evidence of complication. 2. Multiple urinary bladder diverticula with associated urinary bladder wall thickening, likely reflecting bladder outlet obstruction given the patient's large prostate. 3. Small bilateral pleural effusions with diffuse bilateral ground-glass opacities appear unchanged from prior exam. Aortic Atherosclerosis (ICD10-I70.0). Electronically Signed   By: Zerita Boers M.D.   On: 05/24/2020  16:46   CT Angio Chest PE W and/or Wo Contrast  Result Date: 05/22/2020 CLINICAL DATA:  86 year old male with chest pain. Weakness. Shortness of breath. Abnormal D-dimer. EXAM: CT ANGIOGRAPHY CHEST WITH CONTRAST TECHNIQUE: Multidetector CT imaging of the chest was performed using the standard protocol during bolus administration of intravenous contrast. Multiplanar CT image reconstructions and MIPs were obtained to evaluate the vascular anatomy. CONTRAST:  30mL OMNIPAQUE IOHEXOL 350 MG/ML SOLN COMPARISON:  Portable chest earlier today. Chest radiographs 07/09/2015. Neck CT, CTA 07/03/2017 and earlier. FINDINGS:  Cardiovascular: Good contrast bolus timing in the pulmonary arterial tree. No focal filling defect identified in the pulmonary arteries to suggest acute pulmonary embolism. Mild to moderate enlargement of central pulmonary arteries relative to the aorta (series 3, image 147). Calcified aortic atherosclerosis. Calcified coronary artery atherosclerosis. Cardiomegaly. Left chest cardiac pacemaker type device. No pericardial effusion. Mediastinum/Nodes: Negative.  No lymphadenopathy. Lungs/Pleura: Major airways are patent. Widespread although fairly symmetric peripheral, peribronchial, and also in the lung apices this is new or progressed since 2019. No consolidation. Trace layering pleural fluid greater on the left. And dependent pulmonary ground-glass opacity in both lungs Upper Abdomen: Negative for age visible upper abdominal viscera Musculoskeletal: Chronic T12 superior endplate compression suspected to be stable since 2017 radiographs. No acute osseous abnormality identified. Review of the MIP images confirms the above findings. IMPRESSION: 1. Negative for pulmonary embolus. 2. Widespread abnormal although nonspecific pulmonary ground-glass opacity with trace layering pleural fluid. Acute viral/atypical respiratory infection not excluded. Chronic interstitial lung disease is also a consideration. 3. Cardiomegaly with mild to moderate enlargement of the central pulmonary arteries raising the possibility of pulmonary artery hypertension. Aortic Atherosclerosis (ICD10-I70.0). Electronically Signed   By: Genevie Ann M.D.   On: 05/22/2020 14:38   DG CHEST PORT 1 VIEW  Result Date: 05/31/2020 CLINICAL DATA:  Malaise and shortness of breath. Ten day history of generalized weakness, dyspnea on exertion, fever and chills, shortness of breath. EXAM: PORTABLE CHEST 1 VIEW COMPARISON:  Chest x-rays dated 05/29/2020 and 05/25/2020 FINDINGS: Persistent bilateral ground-glass airspace opacities, not significantly changed  compared to the recent studies of 05/29/2020 and 05/25/2020, perhaps slightly worsened on the RIGHT compared to the earlier chest x-ray of 05/22/2020, corresponding to the patchy ground-glass opacities demonstrated on chest CT of 05/22/2020. No new lung findings. No pleural effusion or pneumothorax is seen. Stable cardiomegaly. LEFT chest wall pacemaker/ICD apparatus is stable. Osseous structures about the chest are unremarkable. IMPRESSION: Persistent bilateral ground-glass airspace opacities, not significantly changed. Differential includes atypical pneumonias such as viral or fungal, interstitial pneumonias, edema related to volume overload/CHF, and chronic interstitial diseases. Favor pneumonia versus asymmetric pulmonary edema superimposed on chronic interstitial lung disease. Electronically Signed   By: Franki Cabot M.D.   On: 05/31/2020 05:54   DG CHEST PORT 1 VIEW  Result Date: 05/29/2020 CLINICAL DATA:  Acute on chronic respiratory failure with hypoxia. EXAM: PORTABLE CHEST 1 VIEW COMPARISON:  05/25/2020 FINDINGS: The cardiac silhouette remains enlarged, and a 3 lead pacemaker remains in place. Mixed interstitial and patchy airspace opacities bilaterally have not significantly changed. No sizable pleural effusion or pneumothorax is identified. IMPRESSION: Unchanged bilateral lung opacities which could reflect infection or edema, potentially superimposed on chronic interstitial lung disease. Electronically Signed   By: Logan Bores M.D.   On: 05/29/2020 10:26   DG CHEST PORT 1 VIEW  Result Date: 05/25/2020 CLINICAL DATA:  Shortness of breath EXAM: PORTABLE CHEST 1 VIEW COMPARISON:  05/22/2020 FINDINGS: Likely chronic interstitial lung changes.  Patchy superimposed increased density. No significant pleural effusion. No pneumothorax. Stable cardiomediastinal contours. Left chest wall pacemaker. IMPRESSION: Patchy increased density superimposed on likely chronic interstitial lung disease. This may  reflect superimposed pneumonia or edema. Electronically Signed   By: Macy Mis M.D.   On: 05/25/2020 08:27   DG Chest Port 1 View  Result Date: 05/22/2020 CLINICAL DATA:  Weakness and shortness of breath. EXAM: PORTABLE CHEST 1 VIEW COMPARISON:  Chest x-ray 07/09/2015. FINDINGS: AICD in stable position. Cardiomegaly with mild bilateral interstitial prominence suggesting interstitial edema. Pneumonitis cannot be excluded. No pleural effusion or pneumothorax. IMPRESSION: AICD in stable position. Cardiomegaly with mild bilateral interstitial prominence suggesting interstitial edema. Pneumonitis cannot be excluded. Electronically Signed   By: Marcello Moores  Register   On: 05/22/2020 11:10   CUP PACEART REMOTE DEVICE CHECK  Result Date: 05/06/2020 Scheduled remote reviewed. Normal device function.  1 SVT logged 7 minutes 12 seconds w/ rates 160's bpm 1 ATR - same event as above 10 PMT successfully terminated by PMT algorithm Next remote 91 days.   Orson Eva, DO  [facility]  If 7PM-7AM, please contact night-coverage [email or web address] Password [email or web address] 06/02/2020, 6:00 PM   LOS: 11 days

## 2020-06-02 NOTE — NC FL2 (Signed)
[place] LEVEL OF CARE SCREENING TOOL     IDENTIFICATION  Patient Name: Max Cole Birthdate: 1927/08/24 Sex: male Admission Date (Current Location): 05/22/2020  [facility] and [place] Number:  [employer] and Address:  [place] [address] [place]      Provider Number: [id number]  Attending Physician Name and Address:  Orson Eva, MD  Relative Name and Phone Number:       Current Level of Care: Hospital Recommended Level of Care: [place] Prior Approval Number:    Date Approved/Denied:   PASRR Number: [id number]  Discharge Plan: SNF    Current Diagnoses: Patient Active Problem List   Diagnosis Date Noted  . Lobar pneumonia ([place]) 05/23/2020  . Respiratory failure with hypoxia ([place]) 05/22/2020  . Chronic a-fib ([place]) 05/22/2020  . Pneumonia in infectious disease---??? Atypical Pneumonia 05/22/2020  . Systolic and diastolic CHF, chronic ([place]) 02/06/2020  . Chronic systolic heart failure ([place]) 07/03/2015  . Pacemaker 07/03/2015  . Syncope 05/20/2015  . Nonischemic cardiomyopathy ([place]) 05/20/2015  . Bradycardia   . Carotid stenosis, bilateral 11/19/2013  . Laboratory test 10/15/2011  . Hyperlipidemia 10/15/2011  . Cerebrovascular disease   . Left bundle branch block   . Cardiomyopathy, nonischemic ([place])   . CKD (chronic kidney disease) stage 3, GFR 30-59 ml/min (HCC)     Orientation RESPIRATION BLADDER Height & Weight     Self, Time, Situation, Place  O2 (5L) Continent Weight: 176 lb 9.4 oz (80.1 kg) Height:  6\' 1"  (185.4 cm)  BEHAVIORAL SYMPTOMS/MOOD NEUROLOGICAL BOWEL NUTRITION STATUS      Continent Diet (heart healthy)  AMBULATORY STATUS COMMUNICATION OF NEEDS Skin   Limited Assist   Normal                       Personal Care Assistance Level of Assistance  Bathing, Feeding, Dressing Bathing Assistance: Limited assistance Feeding assistance: Independent Dressing  Assistance: Limited assistance     Functional Limitations Info  Sight, Hearing, Speech Sight Info: Adequate Hearing Info: Adequate Speech Info: Adequate    SPECIAL CARE FACTORS FREQUENCY  PT (By licensed PT)     PT Frequency: 5x/week              Contractures Contractures Info: Not present    Additional Factors Info  Code Status, Allergies Code Status Info: DNR Allergies Info: NKA           Current Medications (06/02/2020):  This is the current hospital active medication list Current Facility-Administered Medications  Medication Dose Route Frequency Provider Last Rate Last Admin  . 0.9 %  sodium chloride infusion  250 mL Intravenous PRN Emokpae, Courage, MD      . acetaminophen (TYLENOL) tablet 650 mg  650 mg Oral Q6H PRN Denton Brick, Courage, MD   650 mg at 05/23/20 2000   Or  . acetaminophen (TYLENOL) suppository 650 mg  650 mg Rectal Q6H PRN Emokpae, Courage, MD      . albuterol (PROVENTIL) (2.5 MG/3ML) 0.083% nebulizer solution 2.5 mg  2.5 mg Nebulization Q4H PRN Denton Brick, Courage, MD   2.5 mg at 05/25/20 0650  . apixaban (ELIQUIS) tablet 2.5 mg  2.5 mg Oral BID Roxan Hockey, MD   2.5 mg at 06/02/20 0915  . bisacodyl (DULCOLAX) suppository 10 mg  10 mg Rectal Daily PRN Roxan Hockey, MD   10 mg at 06/01/20 1538  . carvedilol (COREG) tablet 6.25 mg  6.25 mg  Oral BID WC Johnson, Clanford L, MD   6.25 mg at 06/02/20 0737  . ferrous sulfate tablet 325 mg  325 mg Oral Q breakfast Emokpae, Courage, MD   325 mg at 06/02/20 0737  . furosemide (LASIX) tablet 20 mg  20 mg Oral Daily Johnson, Clanford L, MD   20 mg at 06/02/20 0915  . guaiFENesin (MUCINEX) 12 hr tablet 600 mg  600 mg Oral BID Denton Brick, Courage, MD   600 mg at 06/02/20 0915  . ipratropium-albuterol (DUONEB) 0.5-2.5 (3) MG/3ML nebulizer solution 3 mL  3 mL Nebulization BID Tat, David, MD   3 mL at 06/02/20 0720  . methocarbamol (ROBAXIN) tablet 500 mg  500 mg Oral TID Roxan Hockey, MD   500 mg at 06/02/20  0915  . methylPREDNISolone sodium succinate (SOLU-MEDROL) 40 mg/mL injection 40 mg  40 mg Intravenous Q12H Johnson, Clanford L, MD   40 mg at 06/02/20 0915  . ondansetron (ZOFRAN) tablet 4 mg  4 mg Oral Q6H PRN Emokpae, Courage, MD       Or  . ondansetron (ZOFRAN) injection 4 mg  4 mg Intravenous Q6H PRN Emokpae, Courage, MD      . polyethylene glycol (MIRALAX / GLYCOLAX) packet 17 g  17 g Oral Daily PRN Emokpae, Courage, MD      . polyvinyl alcohol (LIQUIFILM TEARS) 1.4 % ophthalmic solution 1 drop  1 drop Both Eyes PRN Johnson, Clanford L, MD      . polyvinyl alcohol (LIQUIFILM TEARS) 1.4 % ophthalmic solution 1 drop  1 drop Both Eyes TID Earlie Counts, NP   1 drop at 06/02/20 0916  . pravastatin (PRAVACHOL) tablet 20 mg  20 mg Oral QPM Emokpae, Courage, MD   20 mg at 06/01/20 1711  . sodium chloride (OCEAN) 0.65 % nasal spray 1 spray  1 spray Each Nare PRN Orson Eva, MD   1 spray at 05/31/20 2334  . sodium chloride flush (NS) 0.9 % injection 3 mL  3 mL Intravenous Q12H Emokpae, Courage, MD   3 mL at 06/02/20 0916  . sodium chloride flush (NS) 0.9 % injection 3 mL  3 mL Intravenous Q12H Emokpae, Courage, MD   3 mL at 06/02/20 0915  . sodium chloride flush (NS) 0.9 % injection 3 mL  3 mL Intravenous PRN Emokpae, Courage, MD   3 mL at 05/23/20 1011  . tamsulosin (FLOMAX) capsule 0.4 mg  0.4 mg Oral Daily Emokpae, Courage, MD   0.4 mg at 06/02/20 0916  . traZODone (DESYREL) tablet 50 mg  50 mg Oral QHS PRN Roxan Hockey, MD   50 mg at 05/31/20 2115     Discharge Medications: Please see discharge summary for a list of discharge medications.  Relevant Imaging Results:  Relevant Lab Results:   Additional Information SSN [id number], Clydene Pugh, LCSW

## 2020-06-02 NOTE — Progress Notes (Signed)
Palliative-   Spoke with Santiago Glad- She has been notified that patient is stable for discharge today. She is requesting d/c for short term rehab and [facility]- but would not want him to go to any other SNF. [facility] is to take patient home with home health if no bed at [facility], however, she needs time to get his home ready for discharge, get equipment in place, etc. Assured her that patient would not be discharged until safe plan I place.  I have made referral for outpatient Palliative to see as well to continue [place] discussions and assist with symptoms.   Mariana Kaufman, AGNP-C Palliative Medicine  Please call Palliative Medicine team phone with any questions [phone]. For individual providers please see AMION.  No charge note

## 2020-06-02 NOTE — Progress Notes (Signed)
Physical Therapy Treatment Patient Details Name: Max Cole MRN: [id number] DOB: 1927/08/08 Today's Date: 06/02/2020    History of Present Illness 86 y.o. male with medical history of systolic CHF status post BiV PPM, CKD stage III, carotid stenosis, hyperlipidemia, PAF on apixaban and hypertension presenting approximately 10-day history of generalized weakness, dyspnea on exertion, fevers and chills, and shortness of breath.  Apparently, the patient stated that he saw his primary care provider approximately 2 to 3 weeks prior to this admission.  He was placed on a prednisone taper.  There is no improvement of his symptoms.  He took some over-the-counter NyQuil with no improvement.  He called his PCP again, the patient was placed on empiric dose of Tamiflu.  He continued to have malaise and shortness of breath.  As result, he presented for further evaluation.  He denied any nausea, vomiting, diarrhea, chest pain, hemoptysis, abdominal pain, dysuria, hematuria, hematochezia, melena.  The patient has not started any other new medications except for Robaxin and prednisone.  He denies any worsening lower extremity edema, orthopnea, chest pain, headache, sore throat.  He has one cat, no exotic pets.  No travels.In the emergency department, the patient was febrile up to 100.62F.  He was hemodynamically stable with oxygen saturation down to 80% on room air.  He was placed on 4 L nasal cannula with saturation 93-94%.  CTA chest was negative for PE but showed bilateral widespread groundglass opacities there was cardiomegaly with enlarged central pulmonary arteries.  There is a chronic T12 endplate fracture which was stable compared to 2017.    PT Comments    Pt supine in bed on 4.5L O2 with SpO2  92%, pt motivated to participate in therapy. Pt able to come to sitting EOB without physical assistance, only cued for pursed lip breathing with mobility. Pt completes 10 LAQ on RLE with cues for form, desats to 69%,  cued for rest and pursed lip breathing after ~5 minutes SpO2 80% so increased to 5.5L O2 with SpO2 90% after ~5 minutes. Pt able to complete 5 LAQ reps on LLE, cued for pursed lip breathing while performing, but unable to complete 10 reps due to fatigue. Pt stands EOB and takes 3 sidesteps to [facility] with therapist positioned in front of pt for HHA. Pt again desats to 70s with standing sidesteps, assisted back to supine and able to improve to 90% within ~5 minutes on 5.5L O2. Pt supine and titrated back down to 4.5L O2, SpO2 91% at EOS- RN notified. Pt would continue to benefit from acute PT services and recommendations below to continue progressing strength, balance, endurance, gait and decrease risk for falls with mobility.   Follow Up Recommendations  SNF;Supervision for mobility/OOB;Supervision/Assistance - 24 hour     Equipment Recommendations  None recommended by PT    Recommendations for Other Services       Precautions / Restrictions Precautions Precautions: Fall Restrictions Weight Bearing Restrictions: No    Mobility  Bed Mobility Overal bed mobility: Needs Assistance Bed Mobility: Supine to Sit;Sit to Supine  Supine to sit: Supervision Sit to supine: Supervision   General bed mobility comments: no physical assist, cues for PLB with mobility, increased time  Transfers Overall transfer level: Needs assistance Equipment used: 1 person hand held assist Transfers: Sit to/from Stand Sit to Stand: Min assist    General transfer comment: min A to power up from bed with therapist positioned in front of pt  Ambulation/Gait Ambulation/Gait assistance: Min assist  Assistive device: 1 person hand held assist Gait Pattern/deviations: Step-to pattern  General Gait Details: limited to 3 sidesteps up to [facility] with BLE braced against bed, therapist positioned in front of pt to steady   Stairs             Wheelchair Mobility    Modified Rankin (Stroke Patients Only)        Balance Overall balance assessment: Needs assistance Sitting-balance support: Feet supported;No upper extremity supported Sitting balance-Leahy Scale: Good Sitting balance - Comments: seated EOB   Standing balance support: During functional activity;Bilateral upper extremity supported Standing balance-Leahy Scale: Poor Standing balance comment: reliant on UE support       Cognition Arousal/Alertness: Awake/alert Behavior During Therapy: WFL for tasks assessed/performed Overall Cognitive Status: Within Functional Limits for tasks assessed     Exercises General Exercises - Lower Extremity Long Arc Quad: Seated;Strengthening;Both;10 reps (LLE 5 reps only due to fatigue)    General Comments General comments (skin integrity, edema, etc.): Pt on 4.5L O2 upon arrival with SPO2 92%, desats to 69% with activity, cued for seated rest and pursed lip breathing with SpO2 improving to 80% after ~5 minutes so increased to 5.5L O2 with SpO2 90% after additional ~5 minutes      Pertinent Vitals/Pain Pain Assessment: No/denies pain    Home Living                      Prior Function            PT Goals (current goals can now be found in the care plan section) Acute Rehab PT Goals Patient Stated Goal: Go to rehab prior to going home as 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 PT Goal Formulation: With patient/family Time For Goal Achievement: 06/08/20 Potential to Achieve Goals: Fair Progress towards PT goals: Progressing toward goals    Frequency    Min 3X/week      PT Plan Current plan remains appropriate    Co-evaluation              AM-PAC PT "6 Clicks" Mobility   Outcome Measure  Help needed turning from your back to your side while in a flat bed without using bedrails?: None Help needed moving from lying on your back to sitting on the side of a flat bed without using bedrails?: None Help needed moving to and from a bed to a chair (including a  wheelchair)?: A Lot Help needed standing up from a chair using your arms (e.g., wheelchair or bedside chair)?: A Little Help needed to walk in hospital room?: A Lot Help needed climbing 3-5 steps with a railing? : A Lot 6 Click Score: 17    End of Session Equipment Utilized During Treatment: Oxygen Activity Tolerance: Patient tolerated treatment well;Patient limited by fatigue Patient left: in bed;with call bell/phone within reach Nurse Communication: Mobility status;Other (comment) (SpO2) PT Visit Diagnosis: Unsteadiness on feet (R26.81);Other abnormalities of gait and mobility (R26.89);Difficulty in walking, not elsewhere classified (R26.2)     Time: 9741-6384 PT Time Calculation (min) (ACUTE ONLY): 23 min  Charges:  $Therapeutic Exercise: 8-22 mins $Therapeutic Activity: 8-22 mins                      Tori Josilyn Shippee PT, DPT 06/02/20, 3:27 PM [phone]

## 2020-06-02 NOTE — Progress Notes (Signed)
SATURATION QUALIFICATIONS: (This note is used to comply with regulatory documentation for home oxygen)  Patient Saturations on Room Air at Rest = 80%  Patient Saturations on Room Air while Ambulating = 81% pt unable to ambulate, pt sat on side of bed  Patient Saturations on 5 Liters of oxygen while Ambulating = 91% at rest, pt unable to ambulate   Please briefly explain why patient needs home oxygen:

## 2020-06-02 NOTE — TOC Progression Note (Signed)
Transition of Care [facility]) - Progression Note    Patient Details  Name: ARLYN BUERKLE MRN: [id number] Date of Birth: January 21, 1928  [facility] of [facility]) CM/SW Contact  Ihor Gully, LCSW Phone Number: 06/02/2020, 3:45 PM  Clinical Narrative:    Daughter, Santiago Glad, spoke with patient and they are agreeable to SNF for short term rehab. Choices discussed. Referral made.      Barriers to Discharge: Continued Medical Work up  Expected Discharge Plan and Services     Discharge Planning Services: CM Consult Post Acute Care Choice:  (Family unsure) Living arrangements for the past 2 months: Single Family Home                                       Social Determinants of Health (SDOH) Interventions    Readmission Risk Interventions No flowsheet data found.

## 2020-06-03 ENCOUNTER — Telehealth: Payer: Self-pay

## 2020-06-03 ENCOUNTER — Inpatient Hospital Stay
Admission: RE | Admit: 2020-06-03 | Discharge: 2020-06-11 | Disposition: A | Discharge status: Home / Self Care | Payer: Medicare Other | Source: Ambulatory Visit | Attending: Internal Medicine | Admitting: Internal Medicine

## 2020-06-03 DIAGNOSIS — Z950 Presence of cardiac pacemaker: Secondary | ICD-10-CM | POA: Diagnosis not present

## 2020-06-03 DIAGNOSIS — I700 Atherosclerosis of aorta: Secondary | ICD-10-CM | POA: Diagnosis not present

## 2020-06-03 DIAGNOSIS — R262 Difficulty in walking, not elsewhere classified: Secondary | ICD-10-CM | POA: Diagnosis not present

## 2020-06-03 DIAGNOSIS — D72829 Elevated white blood cell count, unspecified: Secondary | ICD-10-CM | POA: Diagnosis not present

## 2020-06-03 DIAGNOSIS — N1832 Chronic kidney disease, stage 3b: Secondary | ICD-10-CM | POA: Diagnosis not present

## 2020-06-03 DIAGNOSIS — J9611 Chronic respiratory failure with hypoxia: Secondary | ICD-10-CM | POA: Diagnosis not present

## 2020-06-03 DIAGNOSIS — J9621 Acute and chronic respiratory failure with hypoxia: Secondary | ICD-10-CM | POA: Diagnosis not present

## 2020-06-03 DIAGNOSIS — I130 Hypertensive heart and chronic kidney disease with heart failure and stage 1 through stage 4 chronic kidney disease, or unspecified chronic kidney disease: Secondary | ICD-10-CM | POA: Diagnosis not present

## 2020-06-03 DIAGNOSIS — M5137 Other intervertebral disc degeneration, lumbosacral region: Secondary | ICD-10-CM | POA: Diagnosis not present

## 2020-06-03 DIAGNOSIS — J189 Pneumonia, unspecified organism: Secondary | ICD-10-CM | POA: Diagnosis not present

## 2020-06-03 DIAGNOSIS — Z741 Need for assistance with personal care: Secondary | ICD-10-CM | POA: Diagnosis not present

## 2020-06-03 DIAGNOSIS — J9601 Acute respiratory failure with hypoxia: Secondary | ICD-10-CM | POA: Diagnosis not present

## 2020-06-03 DIAGNOSIS — E785 Hyperlipidemia, unspecified: Secondary | ICD-10-CM | POA: Diagnosis not present

## 2020-06-03 DIAGNOSIS — I482 Chronic atrial fibrillation, unspecified: Secondary | ICD-10-CM | POA: Diagnosis not present

## 2020-06-03 DIAGNOSIS — I5042 Chronic combined systolic (congestive) and diastolic (congestive) heart failure: Secondary | ICD-10-CM | POA: Diagnosis not present

## 2020-06-03 DIAGNOSIS — I5022 Chronic systolic (congestive) heart failure: Secondary | ICD-10-CM | POA: Diagnosis not present

## 2020-06-03 DIAGNOSIS — M13851 Other specified arthritis, right hip: Secondary | ICD-10-CM | POA: Diagnosis not present

## 2020-06-03 DIAGNOSIS — I428 Other cardiomyopathies: Secondary | ICD-10-CM | POA: Diagnosis not present

## 2020-06-03 DIAGNOSIS — I6529 Occlusion and stenosis of unspecified carotid artery: Secondary | ICD-10-CM | POA: Diagnosis not present

## 2020-06-03 DIAGNOSIS — Z9181 History of falling: Secondary | ICD-10-CM | POA: Diagnosis not present

## 2020-06-03 DIAGNOSIS — I480 Paroxysmal atrial fibrillation: Secondary | ICD-10-CM | POA: Diagnosis not present

## 2020-06-03 DIAGNOSIS — M6281 Muscle weakness (generalized): Secondary | ICD-10-CM | POA: Diagnosis not present

## 2020-06-03 LAB — SARS CORONAVIRUS 2 BY RT PCR (HOSPITAL ORDER, PERFORMED IN ~~LOC~~ HOSPITAL LAB): SARS Coronavirus 2: NEGATIVE

## 2020-06-03 MED ORDER — DM-GUAIFENESIN ER 30-600 MG PO TB12: 1.0000 | ORAL_TABLET | Freq: Two times a day (BID) | ORAL | Status: AC

## 2020-06-03 MED ORDER — TRAZODONE HCL 50 MG PO TABS
50.0000 mg | ORAL_TABLET | Freq: Every evening | ORAL | Status: DC | PRN
Start: 2020-06-03 — End: 2020-06-10

## 2020-06-03 MED ORDER — COMBIVENT RESPIMAT 20-100 MCG/ACT IN AERS: 1.0000 | INHALATION_SPRAY | Freq: Four times a day (QID) | RESPIRATORY_TRACT | Status: DC | PRN

## 2020-06-03 MED ORDER — DOCUSATE SODIUM 100 MG PO CAPS: 100.0000 mg | ORAL_CAPSULE | Freq: Two times a day (BID) | ORAL | Status: DC

## 2020-06-03 MED ORDER — FUROSEMIDE 20 MG PO TABS
20.0000 mg | ORAL_TABLET | Freq: Every day | ORAL | Status: DC
Start: 2020-06-04 — End: 2020-06-10

## 2020-06-03 MED ORDER — POLYETHYLENE GLYCOL 3350 17 G PO PACK
17.0000 g | PACK | Freq: Every day | ORAL | 0 refills | Status: DC
Start: 2020-06-03 — End: 2021-03-17

## 2020-06-03 MED ORDER — CARVEDILOL 6.25 MG PO TABS
6.2500 mg | ORAL_TABLET | Freq: Two times a day (BID) | ORAL | Status: DC
Start: 2020-06-03 — End: 2020-06-10

## 2020-06-03 MED ORDER — PREDNISONE 20 MG PO TABS: ORAL_TABLET | ORAL | 0 refills | Status: DC

## 2020-06-03 NOTE — Telephone Encounter (Signed)
Scheduled patient for hospital follow up office visit for Friday January 7th, 2022 at 9:30am with Dr Halford Chessman at the [place] office

## 2020-06-03 NOTE — TOC Transition Note (Signed)
Transition of Care [facility]) - CM/SW Discharge Note   Patient Details  Name: Max Cole MRN: [id number] Date of Birth: 28-Jan-1928  [facility] of Care [facility]) CM/SW Contact:  Ihor Gully, LCSW Phone Number: 06/03/2020, 11:19 AM   Clinical Narrative:    Discharge clinicals sent to facility. Palliative providers discussed with Santiago Glad. Referral made to [facility] for Palliative services.   Final next level of care: Skilled Nursing Facility Barriers to Discharge: No Barriers Identified   Patient Goals and CMS Choice Patient states their goals for this hospitalization and ongoing recovery are:: rehab then home.   Choice offered to / list presented to : Adult Children  Discharge Placement              Patient chooses bed at: [facility] Patient to be transferred to facility by: staff Name of family member notified: dtr., Santiago Glad Patient and family notified of of transfer: 06/03/20  Discharge Plan and Services   Discharge Planning Services: CM Consult Post Acute Care Choice:  (Family unsure)                               Social Determinants of Health (SDOH) Interventions     Readmission Risk Interventions No flowsheet data found.

## 2020-06-03 NOTE — Progress Notes (Signed)
Discharge instructions given to nurse Marliss Coots at [facility]. Answered all questions at this time. All belongings sent with patient. Family at bedside.

## 2020-06-03 NOTE — Plan of Care (Signed)
  Problem: Education: Goal: Knowledge of General Education information will improve Description: Including pain rating scale, medication(s)/side effects and non-pharmacologic comfort measures 06/03/2020 1418 by Cameron Ali, RN Outcome: Completed/Met 06/03/2020 0844 by Cameron Ali, RN Outcome: Progressing   Problem: Health Behavior/Discharge Planning: Goal: Ability to manage health-related needs will improve 06/03/2020 1418 by Cameron Ali, RN Outcome: Completed/Met 06/03/2020 0844 by Cameron Ali, RN Outcome: Progressing   Problem: Clinical Measurements: Goal: Ability to maintain clinical measurements within normal limits will improve 06/03/2020 1418 by Cameron Ali, RN Outcome: Completed/Met 06/03/2020 0844 by Cameron Ali, RN Outcome: Progressing Goal: Will remain free from infection 06/03/2020 1418 by Cameron Ali, RN Outcome: Completed/Met 06/03/2020 0844 by Cameron Ali, RN Outcome: Progressing Goal: Diagnostic test results will improve 06/03/2020 1418 by Cameron Ali, RN Outcome: Completed/Met 06/03/2020 0844 by Cameron Ali, RN Outcome: Progressing Goal: Respiratory complications will improve 06/03/2020 1418 by Cameron Ali, RN Outcome: Completed/Met 06/03/2020 0844 by Cameron Ali, RN Outcome: Progressing Goal: Cardiovascular complication will be avoided 06/03/2020 1418 by Cameron Ali, RN Outcome: Completed/Met 06/03/2020 0844 by Cameron Ali, RN Outcome: Progressing   Problem: Activity: Goal: Risk for activity intolerance will decrease 06/03/2020 1418 by Cameron Ali, RN Outcome: Completed/Met 06/03/2020 0844 by Cameron Ali, RN Outcome: Progressing   Problem: Nutrition: Goal: Adequate nutrition will be maintained 06/03/2020 1418 by Cameron Ali, RN Outcome: Completed/Met 06/03/2020 0844 by Cameron Ali, RN Outcome: Progressing   Problem: Coping: Goal: Level of anxiety will  decrease 06/03/2020 1418 by Cameron Ali, RN Outcome: Completed/Met 06/03/2020 0844 by Cameron Ali, RN Outcome: Progressing   Problem: Elimination: Goal: Will not experience complications related to bowel motility 06/03/2020 1418 by Cameron Ali, RN Outcome: Completed/Met 06/03/2020 0844 by Cameron Ali, RN Outcome: Progressing Goal: Will not experience complications related to urinary retention 06/03/2020 1418 by Cameron Ali, RN Outcome: Completed/Met 06/03/2020 0844 by Cameron Ali, RN Outcome: Progressing   Problem: Pain Managment: Goal: General experience of comfort will improve 06/03/2020 1418 by Cameron Ali, RN Outcome: Completed/Met 06/03/2020 0844 by Cameron Ali, RN Outcome: Progressing   Problem: Safety: Goal: Ability to remain free from injury will improve 06/03/2020 1418 by Cameron Ali, RN Outcome: Completed/Met 06/03/2020 0844 by Cameron Ali, RN Outcome: Progressing   Problem: Skin Integrity: Goal: Risk for impaired skin integrity will decrease 06/03/2020 1418 by Cameron Ali, RN Outcome: Completed/Met 06/03/2020 0844 by Cameron Ali, RN Outcome: Progressing

## 2020-06-03 NOTE — Telephone Encounter (Signed)
-----   Message from Chesley Mires, MD sent at 06/03/2020  2:08 PM EST ----- Max Cole will be getting discharged from [facility] and going to [facility].  He needs HFU with me or Dr. Melvyn Novas in 4 weeks, please.  V

## 2020-06-03 NOTE — Discharge Summary (Signed)
Physician Discharge Summary  Max Cole MCP:[id number] DOB: [birth year] DOA: 05/22/2020  PCP: Carylon Perches, MD  Admit date: 05/22/2020 Discharge date: 06/03/2020  Time spent: 35 minutes  Recommendations for Outpatient Follow-up:  -Prednisone to be taken 40 mg by mouth on daily basis x1 week; then decrease by 10 mg also down to 10 mg daily. -Continue to wean oxygen supplementation as tolerated -Outpatient follow-up with pulmonology service (office will contact patient with appointment details, in approximately 4 weeks). -Repeat basic metabolic panel in 1 week to follow electrolytes and renal function. -Follow-up with PCP in 2 weeks after discharge from the skilled nursing facility.   Discharge Diagnoses:  Principal Problem:   Respiratory failure with hypoxia (HCC) Active Problems:   CKD (chronic kidney disease) stage 3, GFR 30-59 ml/min (HCC)   Hyperlipidemia   Chronic systolic heart failure (HCC)   Pacemaker   Systolic and diastolic CHF, chronic (HCC)   Chronic a-fib (HCC)   Pneumonia in infectious disease---??? Atypical Pneumonia   Lobar pneumonia (HCC)   Discharge Condition: Stable and improved.  Discharged to [facility] for further care and rehabilitation.  Outpatient follow-up with pulmonology in 4 weeks will be arranged.  CODE STATUS: DNR/DNI.  Diet recommendation: Heart healthy/low-sodium diet  Filed Weights   06/02/20 0511 06/02/20 0900 06/03/20 0348  Weight: 80.2 kg 80.1 kg 80.2 kg    History of present illness:  86 y.o.malewith medical history ofsystolic CHF status postBiV PPM, CKD stage III, carotid stenosis, hyperlipidemia,PAF on apixabanand hypertension presentingapproximately 10-day history of generalized weakness, dyspnea on exertion, fevers and chills, and shortness of breath. Apparently, the patient stated that he saw his primary care provider approximately 2 to 3 weeks prior to this admission. He was placed on a prednisone taper. There is no  improvement of his symptoms. He took some over-the-counter NyQuil with no improvement. He called his PCP again, the patient was placed on empiric dose of Tamiflu. He continued to have malaise and shortness of breath. As result, he presented for further evaluation. He denied any nausea, vomiting, diarrhea, chest pain, hemoptysis, abdominal pain, dysuria, hematuria, hematochezia, melena. The patient has not started any other new medications except for Robaxin and prednisone. He denies any worsening lower extremity edema, orthopnea, chest pain, headache, sore throat.He has one cat, no exotic pets. No travels. In the emergency department, the patient was febrile up to 100.42F. He was hemodynamically stable with oxygen saturation down to 80% on room air. He was placed on 4 L nasal cannula with saturation 93-94%. CTA chest was negative for PE but showed bilateral widespread groundglass opacities there was cardiomegaly with enlarged central pulmonary arteries. There is a chronic T12 endplate fracture which was stable compared to 2017.  Hospital Course:  Acute respiratory failure with hypoxia -Secondary toamiodarone pulm toxicity -Had beenstable on 4 LNC -CTA chest as discussed above--NEG PE -12/5CXR--personally reviewed--bilateral patchy infiltrates -obtain ABG7.436/37/68/25 (0.5) -COVID-19 RT-PCR was negativex2 -Influenza PCR was negativex2 -ESR 18>>>17 -CRP 16.6>>0.6 -ANA--neg -alpha-1-antitrypsin--normal -viral respiratory panel--neg -Appreciate pulmonary service consultation; continue treatment with the steroids and as needed bronchodilator. -Outpatient follow-up in 4 weeks anticipated. -Oxygen saturation goal 88 to 90%. -Continue to wean off as tolerated.   Atypical pneumonia/lobar pneumonia -Urine Legionella antigen--neg -Urine Streptococcus pneumonia antigen--neg -Viral respiratory panel--neg -Patient has completed treatment with antibiotics using azithromycin and  doxycycline.  FUO -In the setting of amiodarone toxicity--last dose 11/29 -now afebrile and hemodynamically stable. -UA neg for pyuria -CT chest as discussed above -blood cultures has remained neg -CT  abd/pelvis--sm bilateral pleural eff; unchanged GGO; mild fat stranding around sigmoid colon; bladder wall thickening appreciated.  Paroxysmal atrial fibrillation -CHADSVASc = 5 -continue apixaban for secondary prevention. -currently in sinus rhythm. -continue adjusted dose of coreg -Amiodarone has been discontinued.  CKD stage IIIb -Baseline creatinine 1.5-1.7 -Continue to follow renal function trend. -Maintain adequate hydration and minimize the use of nephrotoxic agents.  Chronic systolic and diastolic CHF -06/29/7251 echo EF 20 to 25%, grade 1 DD -02/07/2020 echo EF 50-55%, no WMA, grade 1 DD, mild AR -Clinically compensated and stable presently -Continue Daily weights and low-sodium diet. -continue daily Lasix.  Left carotid stenosis -Continue apixaban and statins  Hyperlipidemia -Continue statin -Continue heart healthy diet.  BPH -No complaints of urinary retention -Continue Flomax.  Goals of Care -given advanced age and multiple comorbidities--requested for goals of care discussions with palliative medicine.  -The patient's priority goal is to go home. If he has no meaningful improvement then would not want to continue full scope care. -DNR/DNI  Procedures: See below for x-ray reports.  Consultations:  Pulmonology service.  Discharge Exam: Vitals:   06/03/20 1134 06/03/20 1400  BP:    Pulse: 69   Resp:    Temp:    SpO2: (!) 88% 93%    General: Speaking in full sentences; no chest pain, no nausea, no vomiting.  Patient reports improvement in his pain.  Still requiring 4 L nasal cannula supplementation at time of discharge. Cardiovascular: Rate controlled, no rubs, no gallops, no JVD on examination. Respiratory: Improved air movement  bilaterally; no wheezing, positive rhonchi.  No using accessory muscles. Abdomen: Soft, nontender, distended, positive bowel sounds Extremities: No edema, no cyanosis or clubbing.  Discharge Instructions   Discharge Instructions    Diet - low sodium heart healthy   Complete by: As directed    Discharge instructions   Complete by: As directed    -Prednisone to be taken 40 mg by mouth on daily basis x1 week; then decrease by 10 mg also down to 10 mg daily. -Maintain adequate hydration -Continue to wean oxygen supplementation as tolerated -Outpatient follow-up with pulmonology service. -Follow heart healthy/low-sodium diet.     Allergies as of 06/03/2020   No Known Allergies     Medication List    STOP taking these medications   amiodarone 200 MG tablet Commonly known as: Pacerone   NYQUIL PO   oseltamivir 75 MG capsule Commonly known as: TAMIFLU   predniSONE 10 MG (48) Tbpk tablet Commonly known as: STERAPRED UNI-PAK 48 TAB Replaced by: predniSONE 20 MG tablet     TAKE these medications   acetaminophen 500 MG tablet Commonly known as: TYLENOL Take 500 mg by mouth every 6 (six) hours as needed.   apixaban 2.5 MG Tabs tablet Commonly known as: ELIQUIS Take 1 tablet (2.5 mg total) by mouth 2 (two) times daily.   carvedilol 6.25 MG tablet Commonly known as: COREG Take 1 tablet (6.25 mg total) by mouth 2 (two) times daily with a meal. What changed:   medication strength  See the new instructions.   Combivent Respimat 20-100 MCG/ACT Aers respimat Generic drug: Ipratropium-Albuterol Inhale 1 puff into the lungs every 6 (six) hours as needed for wheezing or shortness of breath.   dextromethorphan-guaiFENesin 30-600 MG 12hr tablet Commonly known as: MUCINEX DM Take 1 tablet by mouth 2 (two) times daily for 20 days.   docusate sodium 100 MG capsule Commonly known as: Colace Take 1 capsule (100 mg total) by mouth 2 (two)  times daily.   ferrous sulfate 325 (65  FE) MG tablet Take 325 mg by mouth daily with breakfast.   furosemide 20 MG tablet Commonly known as: LASIX Take 1 tablet (20 mg total) by mouth daily. Start taking on: June 04, 2020   methocarbamol 500 MG tablet Commonly known as: ROBAXIN Take 1 tablet (500 mg total) by mouth 3 (three) times daily. What changed: when to take this   polyethylene glycol 17 g packet Commonly known as: MIRALAX / GLYCOLAX Take 17 g by mouth daily.   pravastatin 20 MG tablet Commonly known as: PRAVACHOL Take 1 tablet (20 mg total) by mouth every evening.   predniSONE 20 MG tablet Commonly known as: Deltasone Take 40 mg by mouth daily for 1 week; then decrease by 10 mg weekly until down to 10 mg daily; keep taking 10 mg by mouth daily on to follow-up with pulmonologist. Replaces: predniSONE 10 MG (48) Tbpk tablet   tamsulosin 0.4 MG Caps capsule Commonly known as: FLOMAX Take 0.4 mg by mouth See admin instructions. Every other day   traZODone 50 MG tablet Commonly known as: DESYREL Take 1 tablet (50 mg total) by mouth at bedtime as needed for sleep.      No Known Allergies  Contact information for follow-up providers    Chesley Mires, MD Follow up today.   Specialty: Pulmonary Disease Why: Follow-up appointment in 4 weeks at [facility] office will be arranged by office and patient will be contacted. Contact information: [address] [place] [address] [phone]            Contact information for after-discharge care    [facility] .   Service: Skilled Nursing Contact information: [address] [place] [phone]                  The results of significant diagnostics from this hospitalization (including imaging, microbiology, ancillary and laboratory) are listed below for reference.    Significant Diagnostic Studies: CT ABDOMEN PELVIS WO CONTRAST  Result Date:  05/24/2020 CLINICAL DATA:  Generalized weakness and fever of unknown origin. EXAM: CT ABDOMEN AND PELVIS WITHOUT CONTRAST TECHNIQUE: Multidetector CT imaging of the abdomen and pelvis was performed following the standard protocol without IV contrast. COMPARISON:  Chest CT dated 05/22/2020 and renal ultrasound dated 06/02/2009. FINDINGS: Lower chest: Cardiomegaly is partially imaged. There are small bilateral pleural effusions which appear similar to prior exam. Diffuse bilateral ground-glass opacities appear unchanged from prior exam. Hepatobiliary: No focal liver abnormality is seen. No gallstones, gallbladder wall thickening, or biliary dilatation. Pancreas: Unremarkable. No pancreatic ductal dilatation or surrounding inflammatory changes. Spleen: Normal in size without focal abnormality. Adrenals/Urinary Tract: Adrenal glands are unremarkable. Bilateral renal cysts are noted, measuring up to 2.1 cm on the right and 1.8 cm on the left. Kidneys are normal, without renal calculi, focal lesion, or hydronephrosis. Multiple urinary bladder diverticula are noted and there is associated urinary bladder wall thickening, likely reflecting bladder outlet obstruction given the patient's large prostate. Stomach/Bowel: Stomach is within normal limits. Colonic diverticula are noted, particularly in the sigmoid colon. There is a mild fat stranding surrounding the proximal sigmoid colon (series 5 images 42-49) which may reflect acute diverticulitis. No evidence of abscess or fistula formation. No pericecal inflammatory changes are noted to suggest acute appendicitis. No evidence of bowel obstruction. Vascular/Lymphatic: Aortic atherosclerosis. No enlarged abdominal or pelvic lymph nodes. Reproductive: The prostate is  enlarged, measuring 6.5 cm in transverse dimension, and indents the bladder. Other: No abdominal wall hernia or abnormality. No abdominopelvic ascites. Musculoskeletal: Chronic appearing wedge deformities are seen  at T12 and L4 with less than 25% height loss. Insert degenerative IMPRESSION: 1. Mild fat stranding surrounding the proximal sigmoid colon may reflect acute diverticulitis. No evidence of complication. 2. Multiple urinary bladder diverticula with associated urinary bladder wall thickening, likely reflecting bladder outlet obstruction given the patient's large prostate. 3. Small bilateral pleural effusions with diffuse bilateral ground-glass opacities appear unchanged from prior exam. Aortic Atherosclerosis (ICD10-I70.0). Electronically Signed   By: Zerita Boers M.D.   On: 05/24/2020 16:46   CT Angio Chest PE W and/or Wo Contrast  Result Date: 05/22/2020 CLINICAL DATA:  86 year old male with chest pain. Weakness. Shortness of breath. Abnormal D-dimer. EXAM: CT ANGIOGRAPHY CHEST WITH CONTRAST TECHNIQUE: Multidetector CT imaging of the chest was performed using the standard protocol during bolus administration of intravenous contrast. Multiplanar CT image reconstructions and MIPs were obtained to evaluate the vascular anatomy. CONTRAST:  21mL OMNIPAQUE IOHEXOL 350 MG/ML SOLN COMPARISON:  Portable chest earlier today. Chest radiographs 07/09/2015. Neck CT, CTA 07/03/2017 and earlier. FINDINGS: Cardiovascular: Good contrast bolus timing in the pulmonary arterial tree. No focal filling defect identified in the pulmonary arteries to suggest acute pulmonary embolism. Mild to moderate enlargement of central pulmonary arteries relative to the aorta (series 3, image 147). Calcified aortic atherosclerosis. Calcified coronary artery atherosclerosis. Cardiomegaly. Left chest cardiac pacemaker type device. No pericardial effusion. Mediastinum/Nodes: Negative.  No lymphadenopathy. Lungs/Pleura: Major airways are patent. Widespread although fairly symmetric peripheral, peribronchial, and also in the lung apices this is new or progressed since 2019. No consolidation. Trace layering pleural fluid greater on the left. And  dependent pulmonary ground-glass opacity in both lungs Upper Abdomen: Negative for age visible upper abdominal viscera Musculoskeletal: Chronic T12 superior endplate compression suspected to be stable since 2017 radiographs. No acute osseous abnormality identified. Review of the MIP images confirms the above findings. IMPRESSION: 1. Negative for pulmonary embolus. 2. Widespread abnormal although nonspecific pulmonary ground-glass opacity with trace layering pleural fluid. Acute viral/atypical respiratory infection not excluded. Chronic interstitial lung disease is also a consideration. 3. Cardiomegaly with mild to moderate enlargement of the central pulmonary arteries raising the possibility of pulmonary artery hypertension. Aortic Atherosclerosis (ICD10-I70.0). Electronically Signed   By: Genevie Ann M.D.   On: 05/22/2020 14:38   DG CHEST PORT 1 VIEW  Result Date: 05/31/2020 CLINICAL DATA:  Malaise and shortness of breath. Ten day history of generalized weakness, dyspnea on exertion, fever and chills, shortness of breath. EXAM: PORTABLE CHEST 1 VIEW COMPARISON:  Chest x-rays dated 05/29/2020 and 05/25/2020 FINDINGS: Persistent bilateral ground-glass airspace opacities, not significantly changed compared to the recent studies of 05/29/2020 and 05/25/2020, perhaps slightly worsened on the RIGHT compared to the earlier chest x-ray of 05/22/2020, corresponding to the patchy ground-glass opacities demonstrated on chest CT of 05/22/2020. No new lung findings. No pleural effusion or pneumothorax is seen. Stable cardiomegaly. LEFT chest wall pacemaker/ICD apparatus is stable. Osseous structures about the chest are unremarkable. IMPRESSION: Persistent bilateral ground-glass airspace opacities, not significantly changed. Differential includes atypical pneumonias such as viral or fungal, interstitial pneumonias, edema related to volume overload/CHF, and chronic interstitial diseases. Favor pneumonia versus asymmetric  pulmonary edema superimposed on chronic interstitial lung disease. Electronically Signed   By: Franki Cabot M.D.   On: 05/31/2020 05:54   DG CHEST PORT 1 VIEW  Result Date: 05/29/2020 CLINICAL DATA:  Acute on chronic respiratory failure with hypoxia. EXAM: PORTABLE CHEST 1 VIEW COMPARISON:  05/25/2020 FINDINGS: The cardiac silhouette remains enlarged, and a 3 lead pacemaker remains in place. Mixed interstitial and patchy airspace opacities bilaterally have not significantly changed. No sizable pleural effusion or pneumothorax is identified. IMPRESSION: Unchanged bilateral lung opacities which could reflect infection or edema, potentially superimposed on chronic interstitial lung disease. Electronically Signed   By: Sebastian Ache M.D.   On: 05/29/2020 10:26   DG CHEST PORT 1 VIEW  Result Date: 05/25/2020 CLINICAL DATA:  Shortness of breath EXAM: PORTABLE CHEST 1 VIEW COMPARISON:  05/22/2020 FINDINGS: Likely chronic interstitial lung changes. Patchy superimposed increased density. No significant pleural effusion. No pneumothorax. Stable cardiomediastinal contours. Left chest wall pacemaker. IMPRESSION: Patchy increased density superimposed on likely chronic interstitial lung disease. This may reflect superimposed pneumonia or edema. Electronically Signed   By: Guadlupe Spanish M.D.   On: 05/25/2020 08:27   DG Chest Port 1 View  Result Date: 05/22/2020 CLINICAL DATA:  Weakness and shortness of breath. EXAM: PORTABLE CHEST 1 VIEW COMPARISON:  Chest x-ray 07/09/2015. FINDINGS: AICD in stable position. Cardiomegaly with mild bilateral interstitial prominence suggesting interstitial edema. Pneumonitis cannot be excluded. No pleural effusion or pneumothorax. IMPRESSION: AICD in stable position. Cardiomegaly with mild bilateral interstitial prominence suggesting interstitial edema. Pneumonitis cannot be excluded. Electronically Signed   By: Maisie Fus  Register   On: 05/22/2020 11:10   CUP PACEART REMOTE DEVICE  CHECK  Result Date: 05/06/2020 Scheduled remote reviewed. Normal device function.  1 SVT logged 7 minutes 12 seconds w/ rates 160's bpm 1 ATR - same event as above 10 PMT successfully terminated by PMT algorithm Next remote 91 days.   Microbiology: Recent Results (from the past 240 hour(s))  SARS Coronavirus 2 by RT PCR (hospital order, performed in [facility] hospital lab) Nasopharyngeal Nasopharyngeal Swab     Status: None   Collection Time: 06/03/20 11:48 AM   Specimen: Nasopharyngeal Swab  Result Value Ref Range Status   SARS Coronavirus 2 NEGATIVE NEGATIVE Final    Comment: (NOTE) SARS-CoV-2 target nucleic acids are NOT DETECTED.  The SARS-CoV-2 RNA is generally detectable in upper and lower respiratory specimens during the acute phase of infection. The lowest concentration of SARS-CoV-2 viral copies this assay can detect is 250 copies / mL. A negative result does not preclude SARS-CoV-2 infection and should not be used as the sole basis for treatment or other patient management decisions.  A negative result may occur with improper specimen collection / handling, submission of specimen other than nasopharyngeal swab, presence of viral mutation(s) within the areas targeted by this assay, and inadequate number of viral copies (<250 copies / mL). A negative result must be combined with clinical observations, patient history, and epidemiological information.  Fact Sheet for Patients:   BoilerBrush.com.cy  Fact Sheet for Healthcare Providers: https://pope.com/  This test is not yet approved or  cleared by the [place] FDA and has been authorized for detection and/or diagnosis of SARS-CoV-2 by FDA under an Emergency Use Authorization (EUA).  This EUA will remain in effect (meaning this test can be used) for the duration of the COVID-19 declaration under Section 564(b)(1) of the Act, 21 U.S.C. section 360bbb-3(b)(1), unless the  authorization is terminated or revoked sooner.  Performed at [facility], [address] [place], [place] [address]      Labs: Basic Metabolic Panel: Recent Labs  Lab 05/28/20 [phone] 05/28/20 5686 05/29/20 0449 05/30/20 0547 05/31/20 0630 06/01/20 1683  06/02/20 1100  NA 134*   < > 135 135 134* 135 135  K 3.9   < > 4.1 4.0 3.9 4.3 4.1  CL 97*   < > 98 97* 97* 98 97*  CO2 28   < > $R'29 29 29 29 29  'Fm$ GLUCOSE 128*   < > 119* 120* 104* 110* 191*  BUN 41*   < > 38* 41* 41* 41* 43*  CREATININE 1.52*   < > 1.51* 1.54* 1.35* 1.53* 1.53*  CALCIUM 8.2*   < > 8.2* 8.2* 8.0* 8.0* 8.1*  MG 2.2  --  2.3 2.3 2.3 2.4  --    < > = values in this interval not displayed.   Liver Function Tests: Recent Labs  Lab 05/28/20 0528 05/29/20 0449 05/30/20 0547 05/31/20 0630 06/01/20 0810  AST $Re'22 20 21 19 17  'YlY$ ALT $R'25 24 25 22 20  'VE$ ALKPHOS 62 63 70 65 64  BILITOT 0.5 0.7 0.5 0.3 0.5  PROT 5.5* 5.6* 5.6* 5.3* 5.2*  ALBUMIN 2.1* 2.2* 2.3* 2.3* 2.3*   CBC: Recent Labs  Lab 05/29/20 0449 05/30/20 0547 05/31/20 0630 06/01/20 0810 06/02/20 1100  WBC 14.5* 16.8* 19.7* 20.4* 22.5*  NEUTROABS 12.7* 14.1* 16.6* 17.7* 19.7*  HGB 10.4* 11.2* 11.4* 11.6* 12.4*  HCT 31.4* 34.4* 34.5* 35.6* 38.0*  MCV 96.9 97.5 97.2 98.3 98.7  PLT 651* 685* 652* 636* 629*    BNP (last 3 results) Recent Labs    05/23/20 0729 05/24/20 0653  BNP 303.0* 292.0*    Signed:  Barton Dubois MD.  [facility] 06/03/2020, 2:16 PM

## 2020-06-03 NOTE — Care Management Important Message (Signed)
Important Message  Patient Details  Name: Max Cole MRN: [id number] Date of Birth: 16-May-1928   Medicare Important Message Given:  Yes     Tommy Medal 06/03/2020, 2:18 PM

## 2020-06-03 NOTE — Progress Notes (Signed)
Physical Therapy Treatment Patient Details Name: Max Cole MRN: [id number] DOB: [birth year] Today's Date: 06/03/2020    History of Present Illness 86 y.o. male with medical history of systolic CHF status post BiV PPM, CKD stage III, carotid stenosis, hyperlipidemia, PAF on apixaban and hypertension presenting approximately 10-day history of generalized weakness, dyspnea on exertion, fevers and chills, and shortness of breath.  Apparently, the patient stated that he saw his primary care provider approximately 2 to 3 weeks prior to this admission.  He was placed on a prednisone taper.  There is no improvement of his symptoms.  He took some over-the-counter NyQuil with no improvement.  He called his PCP again, the patient was placed on empiric dose of Tamiflu.  He continued to have malaise and shortness of breath.  As result, he presented for further evaluation.  He denied any nausea, vomiting, diarrhea, chest pain, hemoptysis, abdominal pain, dysuria, hematuria, hematochezia, melena.  The patient has not started any other new medications except for Robaxin and prednisone.  He denies any worsening lower extremity edema, orthopnea, chest pain, headache, sore throat.  He has one cat, no exotic pets.  No travels.In the emergency department, the patient was febrile up to 100.33F.  He was hemodynamically stable with oxygen saturation down to 80% on room air.  He was placed on 4 L nasal cannula with saturation 93-94%.  CTA chest was negative for PE but showed bilateral widespread groundglass opacities there was cardiomegaly with enlarged central pulmonary arteries.  There is a chronic T12 endplate fracture which was stable compared to 2017.    PT Comments    Pt supine in bed with daughter present during session, friendly and willing to participate with therapy today.  Monitored O2 saturation through session with decreased saturation with any exertion.  Decreased to 69% following transfer supine to sit.   Increased to 5L O2 A via nasal canal and educated pt importance of posture to allow lunges to expand as well as pursed lip breathing.  Able to increase to 88% <22min.  Decreased saturation noted during therex, pt able to recover to 88% or higher following cueing.  2 sets of sidestepping complete in front of bed with therapist HHA for balance, pt was unstable initially standing but improved with time.  Decreased saturation to 75% so increased to 5.5L which was able to increase to 86% following 3 minutes of PLB.  EOS pt returned to bed with daughter in room.  Call bell within reach, RN aware of status.   Follow Up Recommendations  SNF;Supervision for mobility/OOB;Supervision/Assistance - 24 hour     Equipment Recommendations  None recommended by PT    Recommendations for Other Services       Precautions / Restrictions Precautions Precautions: Fall    Mobility  Bed Mobility Overal bed mobility: Modified Independent Bed Mobility: Supine to Sit     Supine to sit: Supervision     General bed mobility comments: no physical assist, cues for PLB with mobility, increased time  Transfers Overall transfer level: Needs assistance Equipment used: 1 person hand held assist Transfers: Sit to/from Stand Sit to Stand: Min assist         General transfer comment: Cueing for appropriate hand placement to assist, cueing for pursed lip breathing for O2 sat%.  LOB forward upon standing required A  Ambulation/Gait Ambulation/Gait assistance: Min assist Gait Distance (Feet): 4 Feet Assistive device: 1 person hand held assist Gait Pattern/deviations: Step-to pattern Gait velocity: varying   General Gait  Details: limited to 3 sidesteps up to [facility] with BLE braced against bed, therapist positioned in front of pt to steady   Stairs             Wheelchair Mobility    Modified Rankin (Stroke Patients Only)       Balance                                             Cognition Arousal/Alertness: Awake/alert Behavior During Therapy: WFL for tasks assessed/performed Overall Cognitive Status: Within Functional Limits for tasks assessed                                        Exercises General Exercises - Lower Extremity Ankle Circles/Pumps: Both;10 reps;Seated Long Arc Quad: Seated;Strengthening;Both;10 reps    General Comments        Pertinent Vitals/Pain Pain Assessment: No/denies pain    Home Living                      Prior Function            PT Goals (current goals can now be found in the care plan section)      Frequency    Min 3X/week      PT Plan Current plan remains appropriate    Co-evaluation              AM-PAC PT "6 Clicks" Mobility   Outcome Measure  Help needed turning from your back to your side while in a flat bed without using bedrails?: None Help needed moving from lying on your back to sitting on the side of a flat bed without using bedrails?: None Help needed moving to and from a bed to a chair (including a wheelchair)?: A Lot Help needed standing up from a chair using your arms (e.g., wheelchair or bedside chair)?: A Little Help needed to walk in hospital room?: A Lot Help needed climbing 3-5 steps with a railing? : A Lot 6 Click Score: 17    End of Session Equipment Utilized During Treatment: Oxygen;Gait belt Activity Tolerance: Patient tolerated treatment well;Patient limited by fatigue Patient left: in bed;with call bell/phone within reach;with family/visitor present Nurse Communication: Mobility status;Other (comment) (SpO2%) PT Visit Diagnosis: Unsteadiness on feet (R26.81);Other abnormalities of gait and mobility (R26.89);Difficulty in walking, not elsewhere classified (R26.2)     Time: 7096-4383 PT Time Calculation (min) (ACUTE ONLY): 27 min  Charges:  $Therapeutic Exercise: 8-22 mins $Therapeutic Activity: 8-22 mins                     Ihor Austin,  LPTA/CLT; CBIS [phone]  Aldona Lento 06/03/2020, 11:38 AM

## 2020-06-03 NOTE — Progress Notes (Signed)
Pt ready to transfer to [facility].  Can change to prednisone 40 mg daily for 7 days, then 30 mg daily for 7 days, then 20 mg daily for 7 days, and then 10 mg daily.  Will arrange for pulmonary office follow up in 4 weeks to discuss further tapering of prednisone dose.  Chesley Mires, MD [place] Pager - [phone] 06/03/2020, 2:07 PM

## 2020-06-03 NOTE — Plan of Care (Signed)

## 2020-06-04 ENCOUNTER — Encounter: Payer: Self-pay | Admitting: Adult Health

## 2020-06-04 ENCOUNTER — Non-Acute Institutional Stay (SKILLED_NURSING_FACILITY): Payer: Medicare Other | Admitting: Adult Health

## 2020-06-04 DIAGNOSIS — N400 Enlarged prostate without lower urinary tract symptoms: Secondary | ICD-10-CM

## 2020-06-04 DIAGNOSIS — K5909 Other constipation: Secondary | ICD-10-CM

## 2020-06-04 DIAGNOSIS — E43 Unspecified severe protein-calorie malnutrition: Secondary | ICD-10-CM | POA: Insufficient documentation

## 2020-06-04 DIAGNOSIS — E785 Hyperlipidemia, unspecified: Secondary | ICD-10-CM

## 2020-06-04 DIAGNOSIS — J9601 Acute respiratory failure with hypoxia: Secondary | ICD-10-CM | POA: Diagnosis not present

## 2020-06-04 DIAGNOSIS — I482 Chronic atrial fibrillation, unspecified: Secondary | ICD-10-CM | POA: Diagnosis not present

## 2020-06-04 DIAGNOSIS — I5042 Chronic combined systolic (congestive) and diastolic (congestive) heart failure: Secondary | ICD-10-CM | POA: Diagnosis not present

## 2020-06-04 DIAGNOSIS — J189 Pneumonia, unspecified organism: Secondary | ICD-10-CM | POA: Diagnosis not present

## 2020-06-04 DIAGNOSIS — N183 Chronic kidney disease, stage 3 unspecified: Secondary | ICD-10-CM | POA: Insufficient documentation

## 2020-06-04 DIAGNOSIS — I700 Atherosclerosis of aorta: Secondary | ICD-10-CM

## 2020-06-04 DIAGNOSIS — D631 Anemia in chronic kidney disease: Secondary | ICD-10-CM

## 2020-06-04 DIAGNOSIS — N1832 Chronic kidney disease, stage 3b: Secondary | ICD-10-CM

## 2020-06-04 DIAGNOSIS — I428 Other cardiomyopathies: Secondary | ICD-10-CM | POA: Diagnosis not present

## 2020-06-04 DIAGNOSIS — Z8546 Personal history of malignant neoplasm of prostate: Secondary | ICD-10-CM

## 2020-06-04 NOTE — Progress Notes (Signed)
Location:    [place] Room Number: 129/P Place of Service:  SNF (31)   CODE STATUS: DNR  No Known Allergies  Chief Complaint  Patient presents with  . Hospitalization Follow-up    Hospitalization Follow Up    HPI:  He is a 86 year old man who has been hospitalized from 05-22-20 through 06-03-20. He had a 10 day history of sob; doe; weakness fever and chills. Out patient he was treated with a prednisone taper without improvement ; was given tamiflu. He presented to the ED with weakness and shortness of breath. He was tested X2 for covid and flu both times negative. 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 He has completed his abt and is a long prednisone taper ending with long term 10 mg daily. He is here for short term rehab with his goal to return back home. He is using his personal 02 sat machine; I have asked his family to take this home as he is obsessing over the readings. His goal is 88-90%. He denies any cough; he gets short of breath easily. He as a poor tolerance to activity. He denies any pain. He will continue to be followed for his chronic illnesses including:  Systolic and diastolic congestive heart failure/cardiomyopathy nonischemic:   Chronic Afib:   Aortic atherosclerosis  Past Medical History:  Diagnosis Date  . Atrial fibrillation ([place])   . Bradycardia   . Cardiomyopathy 2005   a. Possibly alcoholic; b. cath in 1601-09% ostial D1, PCW of 12, EF of 35-40%;  c. EF of 0.25 in 11/2003;  d. 40-45% in 05/2004;  e. 25% in 10/2008 by echo;  f. 04/2015 Echo: EF 40-45%, diff HK, sev inflat/inf HK, Gr 1 DD, mild AI, triv TR.  . Carotid artery occlusion   . Cerebrovascular disease    Carotid ultrasound in 12/2010-60-79% left internal carotid artery, less than 40% or right RICA, no change  . Chronic kidney disease    Creatinine of 1.6 in 2009  . Hyperlipidemia 10/15/2011    Lipid profile in 03/2009:231, 136, 56, 148  . Left bundle branch block   . Prostate carcinoma ([place])   . Syncope    [place] CRT-P 07/03/15 Dr. Lovena Le    Past Surgical History:  Procedure Laterality Date  . CATARACT EXTRACTION     Right  . COLONOSCOPY N/A 08/16/2012   Procedure: COLONOSCOPY;  Surgeon: Rogene Houston, MD;  [facility];  Service: Endoscopy;  Laterality: N/A;  930  . COLONOSCOPY N/A 10/21/2015   Procedure: COLONOSCOPY;  Surgeon: Rogene Houston, MD;  Location: [facility];  Service: Endoscopy;  Laterality: N/A;  1200  . COLONOSCOPY W/ POLYPECTOMY  2010   Diverticulosis  . EP IMPLANTABLE DEVICE N/A 07/03/2015   Procedure: BiV Pacemaker Insertion CRT-P;  Surgeon: Evans Lance, MD;  Location: [place] CV LAB;  Service: Cardiovascular;  Laterality: N/A;  . EYE SURGERY Right    Cataract  . HERNIA REPAIR      Social History   Socioeconomic History  . Marital status: Married    Spouse name: Not on file  . Number of children: Not on file  . Years of education: Not on file  . Highest education level: Not on file  Occupational History  . Not on file  Tobacco Use  . Smoking status: Former Smoker    Packs/day: 1.00    Years: 30.00  Pack years: 30.00    Quit date: 09/28/1973    Years since quitting: 46.7  . Smokeless tobacco: Never Used  Vaping Use  . Vaping Use: Never used  Substance and Sexual Activity  . Alcohol use: Yes    Alcohol/week: 5.0 standard drinks    Types: 5 Glasses of wine per week    Comment: History of excessive alcohol use; Alternates between glass of wine vs. Liquor drink 5 days per week (04/2015)  . Drug use: No  . Sexual activity: Not on file  Other Topics Concern  . Not on file  Social History Narrative  . Not on file   Social Determinants of Health   Financial Resource Strain: Not on file  Food Insecurity: Not on file  Transportation Needs: Not on file  Physical Activity: Not on file  Stress: Not on file  Social  Connections: Not on file  Intimate Partner Violence: Not on file   Family History  Problem Relation Age of Onset  . Hypertension Mother       VITAL SIGNS BP 125/62   Pulse 68   Temp (!) 97.4 F (36.3 C)   Resp 20   Ht 6\' 1"  (1.854 m)   Wt 173 lb 3.2 oz (78.6 kg)   SpO2 90%   BMI 22.85 kg/m   Outpatient Encounter Medications as of 06/04/2020  Medication Sig  . acetaminophen (TYLENOL) 500 MG tablet Take 500 mg by mouth every 6 (six) hours as needed.  Marland Kitchen apixaban (ELIQUIS) 2.5 MG TABS tablet Take 1 tablet (2.5 mg total) by mouth 2 (two) times daily.  . carvedilol (COREG) 6.25 MG tablet Take 1 tablet (6.25 mg total) by mouth 2 (two) times daily with a meal.  . dextromethorphan-guaiFENesin (MUCINEX DM) 30-600 MG 12hr tablet Take 1 tablet by mouth 2 (two) times daily for 20 days.  Marland Kitchen docusate sodium (COLACE) 100 MG capsule Take 1 capsule (100 mg total) by mouth 2 (two) times daily.  . ferrous sulfate 325 (65 FE) MG tablet Take 325 mg by mouth daily with breakfast.  . furosemide (LASIX) 20 MG tablet Take 1 tablet (20 mg total) by mouth daily.  . Ipratropium-Albuterol (COMBIVENT RESPIMAT) 20-100 MCG/ACT AERS respimat Inhale 1 puff into the lungs every 6 (six) hours as needed for wheezing or shortness of breath.  . methocarbamol (ROBAXIN) 500 MG tablet Take 1 tablet (500 mg total) by mouth 3 (three) times daily.  . NON FORMULARY Diet: ____ Regular, ___x___ NAS, _______Consistent Carbohydrate, _______NPO _____Other  . OXYGEN Inhale 4 L into the lungs continuous.  . polyethylene glycol (MIRALAX / GLYCOLAX) 17 g packet Take 17 g by mouth daily.  . pravastatin (PRAVACHOL) 20 MG tablet Take 1 tablet (20 mg total) by mouth every evening.  . predniSONE (DELTASONE) 20 MG tablet Take 40 mg by mouth daily for 1 week; then decrease by 10 mg weekly until down to 10 mg daily; keep taking 10 mg by mouth daily on to follow-up with pulmonologist.  . tamsulosin (FLOMAX) 0.4 MG CAPS capsule Take 0.4 mg  by mouth See admin instructions. Every other day  . traZODone (DESYREL) 50 MG tablet Take 1 tablet (50 mg total) by mouth at bedtime as needed for sleep.   No facility-administered encounter medications on file as of 06/04/2020.     SIGNIFICANT DIAGNOSTIC EXAMS  TODAY  05-22-20: chest x-ray:  AICD in stable position. Cardiomegaly with mild bilateral interstitial prominence suggesting interstitial edema. Pneumonitis cannot be excluded.  05-22-20: ct angio  of chest:  1. Negative for pulmonary embolus. 2. Widespread abnormal although nonspecific pulmonary ground-glassopacity with trace layering pleural fluid. Acute viral/atypical respiratory infection not excluded. Chronic interstitial lung disease is also a consideration. 3. Cardiomegaly with mild to moderate enlargement of the central pulmonary arteries raising the possibility of pulmonary artery hypertension.  4. Aortic Atherosclerosis   05-24-20: ct of abdomen and pelvis:  1. Mild fat stranding surrounding the proximal sigmoid colon may reflect acute diverticulitis. No evidence of complication. 2. Multiple urinary bladder diverticula with associated urinary bladder wall thickening, likely reflecting bladder outlet obstruction given the patient's large prostate. 3. Small bilateral pleural effusions with diffuse bilateral ground-glass opacities appear unchanged from prior exam. 4.Aortic Atherosclerosis  05-31-20: chest x-ray:  Persistent bilateral ground-glass airspace opacities, not significantly changed. Differential includes atypical pneumonias such as viral or fungal, interstitial pneumonias, edema related to volume overload/CHF, and chronic interstitial diseases. Favor pneumonia versus asymmetric pulmonary edema superimposed on chronic interstitial lung disease.  LABS REVIEWED TODAY  05-22-20: wbc 9.9 hgb 11.3; hct 34.4; mcv 98.6 plt 343; glucose 101; bun 21; creat 1.72; k+ 3.9; na++ 134; ca 8.5 liver normal albumin 2.7 blood  culture: no growth 05-25-20: wbc 12.0; hgb 10.2; hct 30.6; mcv 97.1 plt 480; glucose 103; bun 19; creat 1.36; k+ 3.9; na++ 130; ca 8.4 liver norm albumin 2.1 05-29-20: glucose 119; bun 38; creat 1.51; k+ 4.1; na++ 135; ca 8.0 ;liver normal albumin 2.2 mag 2.3 06-01-20: glucose 110; bun 41; creat 1.53; k+ 4.3; na++ 135; ca 8.0 liver normal albumin 2.3  06-02-20: wbc 22.5; hgb 12.4; hct 38.0; mcv 98.7 plt 629; glucose 191; bun 43; creat 1.53; k+ 4.1; na++ 135; ca 8.1   Review of Systems  Constitutional: Positive for malaise/fatigue.  Respiratory: Positive for shortness of breath. Negative for cough.   Cardiovascular: Negative for chest pain, palpitations and leg swelling.  Gastrointestinal: Negative for abdominal pain, constipation and heartburn.  Musculoskeletal: Negative for back pain, joint pain and myalgias.  Skin: Negative.   Neurological: Negative for dizziness.  Psychiatric/Behavioral: The patient is not nervous/anxious.    Physical Exam Constitutional:      General: He is not in acute distress.    Appearance: He is well-developed and well-nourished. He is not diaphoretic.  Neck:     Thyroid: No thyromegaly.  Cardiovascular:     Rate and Rhythm: Normal rate. Rhythm irregular.     Pulses: Normal pulses and intact distal pulses.     Heart sounds: Normal heart sounds.  Pulmonary:     Effort: Pulmonary effort is normal. No respiratory distress.     Breath sounds: Normal breath sounds.  Abdominal:     General: Bowel sounds are normal. There is no distension.     Palpations: Abdomen is soft.     Tenderness: There is no abdominal tenderness.  Musculoskeletal:        General: No edema. Normal range of motion.     Cervical back: Neck supple.     Right lower leg: No edema.     Left lower leg: No edema.  Lymphadenopathy:     Cervical: No cervical adenopathy.  Skin:    General: Skin is warm and dry.  Neurological:     Mental Status: He is alert and oriented to person, place, and  time.     Comments: 02 4 liter   Psychiatric:        Mood and Affect: Mood and affect and mood normal.  ASSESSMENT/ PLAN:  TODAY  1. Acute respiratory failure with hypoxia: amiodarone pulmonary toxicity: is stable is 02 dependent 4 liter. Goal is 8-90%. Will continue combivent respimat 20/100 mcg 1 puff every 6 hours as needed is on prednisone taper to a long term dose of 10 mg daily   2. Pneumonia of both lungs due to infectious organism unspecified part of lung. Is stable has completed abt; will continue mucinex dm twice daily for 20 day.   3. Systolic and diastolic congestive heart failure/cardiomyopathy nonischemic: is stable EF 50-55% (02-07-20) is stable will continue lasix 20 mg daily coreg 6.25 mg twice daily  4. Chronic Afib: heart rate is stable will continue coreg 6.25 mg twice daily for rate control  and eliquis 2.5 mg twice daily  5.  Aortic atherosclerosis: will monitor  6. Stage 3b chronic kidney disease is stable bun 43 creat 1.53  7. Hyperlipidemia unspecified hyperlipidemia type: is stable will continue pravachol 40 mg daily   8. BPH without urinary obstruction/history of prostate cancer: is stable will continue flomax 0.4 mg every other day  9. Anemia due to stage 3b chronic kidney disease: is stable hgb 12.4 will continue iron daily   10. Chronic constipation: is stable will continue miralax daily and colace twice daily   11. Protein calorie malnutrition: severe albumin 2.3 will begin prostat 3 times daily   Will check cbc; bmp    MD is aware of resident's narcotic use and is in agreement with current plan of care. We will attempt to wean resident as appropriate.  Ok Edwards NP [facility]  Contact [phone] Monday through Friday 8am- 5pm  After hours call [phone]

## 2020-06-05 ENCOUNTER — Encounter: Payer: Self-pay | Admitting: Internal Medicine

## 2020-06-05 ENCOUNTER — Non-Acute Institutional Stay (SKILLED_NURSING_FACILITY): Payer: Medicare Other | Admitting: Internal Medicine

## 2020-06-05 DIAGNOSIS — J9601 Acute respiratory failure with hypoxia: Secondary | ICD-10-CM

## 2020-06-05 DIAGNOSIS — N1832 Chronic kidney disease, stage 3b: Secondary | ICD-10-CM

## 2020-06-05 DIAGNOSIS — I482 Chronic atrial fibrillation, unspecified: Secondary | ICD-10-CM | POA: Diagnosis not present

## 2020-06-05 NOTE — Patient Instructions (Signed)
See assessment and plan under each diagnosis in the problem list and acutely for this visit 

## 2020-06-05 NOTE — Assessment & Plan Note (Addendum)
Rhythm is regular; he has PAF rather than chronic A. Fib. 05/22/2020 EKG confirms this

## 2020-06-05 NOTE — Assessment & Plan Note (Signed)
06/02/2020 creatinine 1.53 and GFR 43. Avoid nephrotoxic drugs.

## 2020-06-05 NOTE — Assessment & Plan Note (Addendum)
Amiodarone discontinued.  Discharged on intensive pulmonary toilet with high-dose oral prednisone with subsequent wean.   Incentive spirometry and flutter valve added to pulmonary toilet. Pulmonology follow-up 07/03/2020.

## 2020-06-05 NOTE — Progress Notes (Signed)
NURSING HOME LOCATION:  [place] NUMBER: 129/P   CODE STATUS: DNR   PCP: Asencion Noble, MD   This is a comprehensive admission note to [facility] performed on this date less than 30 days from date of admission. Included are preadmission medical/surgical history; reconciled medication list; family history; social history and comprehensive review of systems.  Corrections and additions to the records were documented. Comprehensive physical exam was also performed. Additionally a clinical summary was entered for each active diagnosis pertinent to this admission in the Problem List to enhance continuity of care.  HPI: He was hospitalized 11/26-12/01/2020 with respiratory failure with hypoxia.  He presented with a 10-day history of generalized weakness,  fever and chills and dyspnea at rest exacerbated by exertion.  His PCP had placed him on a prednisone taper approximately 2--3 weeks PTA without improvement in the symptoms. He, his wife, and daughter validate that his present status is a dramatic change from his usual health. He worked out @ the gym 5 days a week spending 20 minutes on a treadmill in addition to other exercises.  He also did all his own yard work.  His daughter states that he had low-grade fevers prior to admission over a week plus.  There was a dramatic deterioration in his respiratory status the 3 days prior to admission.  From being able to walk several blocks he required help getting in and out of the car. In the ED temp was 100.7; O2 sats were 80% on room air.  On 4 L O2 sats were 93-94%.  CTA of the chest revealed no PTE but showed bilateral widespread groundglass opacities associated with cardiomegaly and enlargement of the central pulmonary arteries.  Incidental finding was a chronic T12 endplate fracture, stable since 2017. 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  Covid and viral panel were  negative. Initially CRP was 16.6 but subsequently decreased to 0.6.  Sed rate, ANA, alpha-1 antitrypsin, urine Legionella, and urine Streptococcus pneumonia antigen were all negative or normal. Apixaban was continued for hx of PAF as secondary prevention.  Carvedilol dosage was adjusted.  Amiodarone was discontinued. He was discharged on a prednisone wean starting with 40 mg daily for 1 week then decreasing by 10 mg daily each week until he reached a maintenance dose of 10 mg daily.  He has a Pulmonology follow-up 07/03/2020.  Past medical and surgical history: Includes CKD stage III, left carotid stenosis, dyslipidemia, combined systolic/diastolic CHF, paroxysmal A. Fib, and history of prostate cancer. Surgeries and procedures include BiV permanent pacemaker placement, and colonic polypectomy.  Social history: Former 30-pack-year smoker. Social drinker.  Family history: Noncontributory due to age.  His daughter is a Music therapist.   Review of systems: Staff reports that he does have good upper body strength but is extremely unstable upon standing.  O2 sats have been in the 70s despite 5 L of nasal oxygen.  He supports that his shortness of breath waxes and wanes.  It tends to be worse in the morning.  He states that while hospitalized he did have significant sputum production but not at this time.   He admits to some anxiety due to his condition.  He has some constipation.  Constitutional: No fever, significant weight change Eyes: No redness, discharge, pain, vision change ENT/mouth: No nasal congestion, purulent discharge, earache, change in hearing, sore throat  Cardiovascular: No chest pain, palpitations, paroxysmal nocturnal dyspnea, edema  Respiratory: No hemoptysis, significant  snoring, apnea Gastrointestinal: No heartburn, dysphagia, abdominal pain, nausea /vomiting, rectal bleeding, melena Genitourinary: No dysuria, hematuria, pyuria, incontinence, nocturia Musculoskeletal: No joint  stiffness, joint swelling, weakness, pain Dermatologic: No rash, pruritus, change in appearance of skin Neurologic: No headache, syncope, seizures, numbness, tingling Psychiatric: No significant depression, insomnia, anorexia Endocrine: No change in hair/skin/nails, excessive thirst, excessive hunger, excessive urination  Hematologic/lymphatic: No significant bruising, lymphadenopathy, abnormal bleeding Allergy/immunology: No itchy/watery eyes, significant sneezing, urticaria, angioedema  Physical exam:  Pertinent or positive findings: Despite his condition he appears younger than his stated age.  Pattern alopecia is present.  He has a beard and mustache.  Ptosis is present bilaterally.  He is wearing nasal oxygen.  He is somewhat hoarse.  Breath sounds are decreased anteriorly.  Posteriorly he has dry rales in the lower lobes.  This first heart sound is increased.  Pacer is present over the left chest.  The dorsalis pedis pulses are stronger than the posterior tibial pulses.  He has trace edema at the ankles.  He has a non professional tattoo over the right forearm.  There is a dressing over the dorsum of the right hand.  General appearance: Adequately nourished; no acute distress, increased work of breathing @ present.   Lymphatic: No lymphadenopathy about the head, neck, axilla. Eyes: No conjunctival inflammation or lid edema is present. There is no scleral icterus. Ears:  External ear exam shows no significant lesions or deformities.   Nose:  External nasal examination shows no deformity or inflammation. Nasal mucosa are pink and moist without lesions, exudates Oral exam: Lips and gums are healthy appearing. Neck:  No thyromegaly, masses, tenderness noted.    Heart:  Normal rate and regular rhythm.  S2 normal without gallop, murmur, click, rub.  Lungs:  without wheezes, rhonchi, rubs. Abdomen: Bowel sounds are normal.  Abdomen is soft and nontender with no organomegaly, hernias, masses. GU:  Deferred  Extremities:  No cyanosis, clubbing. Neurologic exam:  Strength equal  in upper & lower extremities. Balance, Rhomberg, finger to nose testing could not be completed due to clinical state Skin: Warm & dry w/o tenting. No significant rash.  See clinical summary under each active problem in the Problem List with associated updated therapeutic plan

## 2020-06-08 ENCOUNTER — Telehealth: Payer: Self-pay | Admitting: Cardiology

## 2020-06-08 ENCOUNTER — Non-Acute Institutional Stay (SKILLED_NURSING_FACILITY): Payer: Medicare Other | Admitting: Adult Health

## 2020-06-08 ENCOUNTER — Encounter: Payer: Self-pay | Admitting: Adult Health

## 2020-06-08 ENCOUNTER — Encounter (HOSPITAL_COMMUNITY)
Admission: RE | Admit: 2020-06-08 | Discharge: 2020-06-08 | Disposition: A | Discharge status: Home / Self Care | Payer: Medicare Other | Source: Skilled Nursing Facility | Attending: Adult Health | Admitting: Adult Health

## 2020-06-08 DIAGNOSIS — I130 Hypertensive heart and chronic kidney disease with heart failure and stage 1 through stage 4 chronic kidney disease, or unspecified chronic kidney disease: Secondary | ICD-10-CM | POA: Insufficient documentation

## 2020-06-08 DIAGNOSIS — D72829 Elevated white blood cell count, unspecified: Secondary | ICD-10-CM | POA: Diagnosis not present

## 2020-06-08 DIAGNOSIS — J9601 Acute respiratory failure with hypoxia: Secondary | ICD-10-CM | POA: Diagnosis not present

## 2020-06-08 LAB — CBC WITH DIFFERENTIAL/PLATELET
Abs Immature Granulocytes: 0.73 10*3/uL — ABNORMAL HIGH (ref 0.00–0.07)
Basophils Absolute: 0.1 10*3/uL (ref 0.0–0.1)
Basophils Relative: 0 %
Eosinophils Absolute: 0.1 10*3/uL (ref 0.0–0.5)
Eosinophils Relative: 0 %
HCT: 32.3 % — ABNORMAL LOW (ref 39.0–52.0)
Hemoglobin: 10.5 g/dL — ABNORMAL LOW (ref 13.0–17.0)
Immature Granulocytes: 3 %
Lymphocytes Relative: 5 %
Lymphs Abs: 1.1 10*3/uL (ref 0.7–4.0)
MCH: 32.1 pg (ref 26.0–34.0)
MCHC: 32.5 g/dL (ref 30.0–36.0)
MCV: 98.8 fL (ref 80.0–100.0)
Monocytes Absolute: 2.2 10*3/uL — ABNORMAL HIGH (ref 0.1–1.0)
Monocytes Relative: 10 %
Neutro Abs: 18.8 10*3/uL — ABNORMAL HIGH (ref 1.7–7.7)
Neutrophils Relative %: 82 %
Platelets: 321 10*3/uL (ref 150–400)
RBC: 3.27 MIL/uL — ABNORMAL LOW (ref 4.22–5.81)
RDW: 13.2 % (ref 11.5–15.5)
WBC: 23 10*3/uL — ABNORMAL HIGH (ref 4.0–10.5)
nRBC: 0 % (ref 0.0–0.2)

## 2020-06-08 LAB — URINALYSIS, ROUTINE W REFLEX MICROSCOPIC
Bilirubin Urine: NEGATIVE
Glucose, UA: NEGATIVE mg/dL
Hgb urine dipstick: NEGATIVE
Ketones, ur: NEGATIVE mg/dL
Leukocytes,Ua: NEGATIVE
Nitrite: NEGATIVE
Protein, ur: NEGATIVE mg/dL
Specific Gravity, Urine: 1.014 (ref 1.005–1.030)
pH: 7 (ref 5.0–8.0)

## 2020-06-08 LAB — BASIC METABOLIC PANEL
Anion gap: 5 (ref 5–15)
BUN: 35 mg/dL — ABNORMAL HIGH (ref 8–23)
CO2: 31 mmol/L (ref 22–32)
Calcium: 7.7 mg/dL — ABNORMAL LOW (ref 8.9–10.3)
Chloride: 99 mmol/L (ref 98–111)
Creatinine, Ser: 1.31 mg/dL — ABNORMAL HIGH (ref 0.61–1.24)
GFR, Estimated: 52 mL/min — ABNORMAL LOW (ref >60)
Glucose, Bld: 85 mg/dL (ref 70–99)
Potassium: 4.2 mmol/L (ref 3.5–5.1)
Sodium: 135 mmol/L (ref 135–145)

## 2020-06-08 NOTE — Progress Notes (Signed)
Location: [place] Room Number: 129/P Place of Service:  SNF (31)   CODE STATUS: DNR  No Known Allergies  Chief Complaint  Patient presents with  . Acute Visit    Labs    HPI:  His routine labs were performed with a wbc of 23,000; which have been steadily increasing.  He does have a cough; denies shortness of breath; denies any sputum production. He denies any urinary symptoms. There are no reports of fevers present.   Past Medical History:  Diagnosis Date  . Atrial fibrillation ([place])   . Bradycardia   . Cardiomyopathy 2005   a. Possibly alcoholic; b. cath in 9233-00% ostial D1, PCW of 12, EF of 35-40%;  c. EF of 0.25 in 11/2003;  d. 40-45% in 05/2004;  e. 25% in 10/2008 by echo;  f. 04/2015 Echo: EF 40-45%, diff HK, sev inflat/inf HK, Gr 1 DD, mild AI, triv TR.  . Carotid artery occlusion   . Cerebrovascular disease    Carotid ultrasound in 12/2010-60-79% left internal carotid artery, less than 40% or right RICA, no change  . Chronic kidney disease    Creatinine of 1.6 in 2009  . Hyperlipidemia 10/15/2011   Lipid profile in 03/2009:231, 136, 56, 148  . Left bundle branch block   . Prostate carcinoma ([place])   . Syncope    [place] CRT-P 07/03/15 Dr. Lovena Le    Past Surgical History:  Procedure Laterality Date  . CATARACT EXTRACTION     Right  . COLONOSCOPY N/A 08/16/2012   Procedure: COLONOSCOPY;  Surgeon: Rogene Houston, MD;  Location: [facility];  Service: Endoscopy;  Laterality: N/A;  930  . COLONOSCOPY N/A 10/21/2015   Procedure: COLONOSCOPY;  Surgeon: Rogene Houston, MD;  Location: [facility];  Service: Endoscopy;  Laterality: N/A;  1200  . COLONOSCOPY W/ POLYPECTOMY  2010   Diverticulosis  . EP IMPLANTABLE DEVICE N/A 07/03/2015   Procedure: BiV Pacemaker Insertion CRT-P;  Surgeon: Evans Lance, MD;  Location: [place] CV LAB;  Service: Cardiovascular;  Laterality: N/A;  . EYE SURGERY Right    Cataract  . HERNIA REPAIR      Social  History   Socioeconomic History  . Marital status: Married    Spouse name: Not on file  . Number of children: Not on file  . Years of education: Not on file  . Highest education level: Not on file  Occupational History  . Not on file  Tobacco Use  . Smoking status: Former Smoker    Packs/day: 1.00    Years: 30.00    Pack years: 30.00    Quit date: 09/28/1973    Years since quitting: 46.7  . Smokeless tobacco: Never Used  Vaping Use  . Vaping Use: Never used  Substance and Sexual Activity  . Alcohol use: Yes    Alcohol/week: 5.0 standard drinks    Types: 5 Glasses of wine per week    Comment: History of excessive alcohol use; Alternates between glass of wine vs. Liquor drink 5 days per week (04/2015)  . Drug use: No  . Sexual activity: Not on file  Other Topics Concern  . Not on file  Social History Narrative  . Not on file   Social Determinants of Health   Financial Resource Strain: Not on file  Food Insecurity: Not on file  Transportation Needs: Not on file  Physical Activity: Not on file  Stress: Not on file  Social Connections: Not  on file  Intimate Partner Violence: Not on file   Family History  Problem Relation Age of Onset  . Hypertension Mother       VITAL SIGNS BP 118/70   Pulse 75   Temp 98 F (36.7 C)   Resp 20   Ht 6\' 1"  (1.854 m)   Wt 169 lb 12.8 oz (77 kg)   SpO2 (!) 88%   BMI 22.40 kg/m   Outpatient Encounter Medications as of 06/08/2020  Medication Sig  . acetaminophen (TYLENOL) 500 MG tablet Take 500 mg by mouth every 6 (six) hours as needed.  . Amino Acids-Protein Hydrolys (FEEDING SUPPLEMENT, PRO-STAT 64,) LIQD Take 30 mLs by mouth in the morning and at bedtime.  Marland Kitchen apixaban (ELIQUIS) 2.5 MG TABS tablet Take 1 tablet (2.5 mg total) by mouth 2 (two) times daily.  . carvedilol (COREG) 6.25 MG tablet Take 1 tablet (6.25 mg total) by mouth 2 (two) times daily with a meal.  . dextromethorphan-guaiFENesin (MUCINEX DM) 30-600 MG 12hr tablet  Take 1 tablet by mouth 2 (two) times daily for 20 days.  Marland Kitchen docusate sodium (COLACE) 100 MG capsule Take 1 capsule (100 mg total) by mouth 2 (two) times daily.  . ferrous sulfate 325 (65 FE) MG tablet Take 325 mg by mouth daily with breakfast.  . furosemide (LASIX) 20 MG tablet Take 1 tablet (20 mg total) by mouth daily.  . Ipratropium-Albuterol (COMBIVENT RESPIMAT) 20-100 MCG/ACT AERS respimat Inhale 1 puff into the lungs every 6 (six) hours as needed for wheezing or shortness of breath.  . methocarbamol (ROBAXIN) 500 MG tablet Take 1 tablet (500 mg total) by mouth 3 (three) times daily.  . NON FORMULARY Diet: ____ Regular, ___x___ NAS, _______Consistent Carbohydrate, _______NPO _____Other  . OXYGEN Inhale 4 L into the lungs continuous.  . polyethylene glycol (MIRALAX / GLYCOLAX) 17 g packet Take 17 g by mouth daily.  . pravastatin (PRAVACHOL) 20 MG tablet Take 1 tablet (20 mg total) by mouth every evening.  . predniSONE (DELTASONE) 20 MG tablet Take 40 mg by mouth daily for 1 week; then decrease by 10 mg weekly until down to 10 mg daily; keep taking 10 mg by mouth daily on to follow-up with pulmonologist.  . tamsulosin (FLOMAX) 0.4 MG CAPS capsule Take 0.4 mg by mouth See admin instructions. Every other day  . traZODone (DESYREL) 50 MG tablet Take 1 tablet (50 mg total) by mouth at bedtime as needed for sleep.   No facility-administered encounter medications on file as of 06/08/2020.     SIGNIFICANT DIAGNOSTIC EXAMS  PREVIOUS   05-22-20: chest x-ray:  AICD in stable position. Cardiomegaly with mild bilateral interstitial prominence suggesting interstitial edema. Pneumonitis cannot be excluded.  05-22-20: ct angio of chest:  1. Negative for pulmonary embolus. 2. Widespread abnormal although nonspecific pulmonary ground-glassopacity with trace layering pleural fluid. Acute viral/atypical respiratory infection not excluded. Chronic interstitial lung disease is also a consideration. 3.  Cardiomegaly with mild to moderate enlargement of the central pulmonary arteries raising the possibility of pulmonary artery hypertension.  4. Aortic Atherosclerosis   05-24-20: ct of abdomen and pelvis:  1. Mild fat stranding surrounding the proximal sigmoid colon may reflect acute diverticulitis. No evidence of complication. 2. Multiple urinary bladder diverticula with associated urinary bladder wall thickening, likely reflecting bladder outlet obstruction given the patient's large prostate. 3. Small bilateral pleural effusions with diffuse bilateral ground-glass opacities appear unchanged from prior exam. 4.Aortic Atherosclerosis  05-31-20: chest x-ray:  Persistent bilateral ground-glass  airspace opacities, not significantly changed. Differential includes atypical pneumonias such as viral or fungal, interstitial pneumonias, edema related to volume overload/CHF, and chronic interstitial diseases. Favor pneumonia versus asymmetric pulmonary edema superimposed on chronic interstitial lung disease.  NO NEW EXAMS.   LABS REVIEWED PREVIOUS   05-22-20: wbc 9.9 hgb 11.3; hct 34.4; mcv 98.6 plt 343; glucose 101; bun 21; creat 1.72; k+ 3.9; na++ 134; ca 8.5 liver normal albumin 2.7 blood culture: no growth 05-25-20: wbc 12.0; hgb 10.2; hct 30.6; mcv 97.1 plt 480; glucose 103; bun 19; creat 1.36; k+ 3.9; na++ 130; ca 8.4 liver norm albumin 2.1 05-29-20: glucose 119; bun 38; creat 1.51; k+ 4.1; na++ 135; ca 8.0 ;liver normal albumin 2.2 mag 2.3 06-01-20: glucose 110; bun 41; creat 1.53; k+ 4.3; na++ 135; ca 8.0 liver normal albumin 2.3  06-02-20: wbc 22.5; hgb 12.4; hct 38.0; mcv 98.7 plt 629; glucose 191; bun 43; creat 1.53; k+ 4.1; na++ 135; ca 8.1   TODAY   06-08-20: wbc 23.0; hgb 10.5; hct 32.3; mcv 98.8 plt 629; glucose 85; bun 35; creat 1.31; k+ 4.2; na++ 135; ca 7.7   Review of Systems  Constitutional: Positive for malaise/fatigue.  Respiratory: Positive for cough. Negative for shortness of  breath.   Cardiovascular: Negative for chest pain, palpitations and leg swelling.  Gastrointestinal: Negative for abdominal pain, constipation and heartburn.  Musculoskeletal: Negative for back pain, joint pain and myalgias.  Skin: Negative.   Neurological: Negative for dizziness.  Psychiatric/Behavioral: The patient is not nervous/anxious.    Physical Exam Constitutional:      General: He is not in acute distress.    Appearance: He is well-developed and well-nourished. He is not diaphoretic.  Neck:     Thyroid: No thyromegaly.  Cardiovascular:     Rate and Rhythm: Normal rate. Rhythm irregular.     Pulses: Normal pulses and intact distal pulses.     Heart sounds: Normal heart sounds.     Comments: Pace maker  Pulmonary:     Effort: Pulmonary effort is normal. No respiratory distress.     Breath sounds: Rhonchi present.     Comments: 02 4-6 liters  Abdominal:     General: Bowel sounds are normal. There is no distension.     Palpations: Abdomen is soft.     Tenderness: There is no abdominal tenderness.  Musculoskeletal:        General: No edema. Normal range of motion.     Cervical back: Neck supple.     Right lower leg: No edema.     Left lower leg: No edema.  Lymphadenopathy:     Cervical: No cervical adenopathy.  Skin:    General: Skin is warm and dry.  Neurological:     Mental Status: He is alert and oriented to person, place, and time.  Psychiatric:        Mood and Affect: Mood and affect and mood normal.      ASSESSMENT/ PLAN:  TODAY  1. Leukocytosis  2. Acute respiratory failure   Will get chest x-ray; blood culture X 2 sites urine culture Will treat as indicated.    MD is aware of resident's narcotic use and is in agreement with current plan of care. We will attempt to wean resident as appropriate.  Ok Edwards NP [facility]  Contact [phone] Monday through Friday 8am- 5pm  After hours call [phone]

## 2020-06-08 NOTE — Telephone Encounter (Signed)
I spoke with daughter and explained that we had not called her from cardiology. She will touch base with his primary.

## 2020-06-08 NOTE — Telephone Encounter (Signed)
New message    Patient daughter calling back, she is concerned at what might have been told to her mother (advancing dementia), her mother told her that Dr Harl Bowie called to discuss her father condition?

## 2020-06-09 ENCOUNTER — Non-Acute Institutional Stay: Payer: Self-pay | Admitting: Adult Health

## 2020-06-09 ENCOUNTER — Encounter: Payer: Self-pay | Admitting: Adult Health

## 2020-06-09 DIAGNOSIS — J181 Lobar pneumonia, unspecified organism: Secondary | ICD-10-CM

## 2020-06-09 LAB — URINE CULTURE: Culture: NO GROWTH

## 2020-06-09 NOTE — Progress Notes (Addendum)
Location:  [facility] Room Number: 129- P Place of Service:  SNF (31)   CODE STATUS: DNR  No Known Allergies  Chief Complaint  Patient presents with  . Acute Visit    Follow-up on chest xray    HPI:  His chest x-ray does demonstrate pneumonia; which is reflective of his elevated wbc. He denies coughing today; does have shortness of breath; no fevers.    Past Medical History:  Diagnosis Date  . Atrial fibrillation ([place])   . Bradycardia   . Cardiomyopathy 2005   a. Possibly alcoholic; b. cath in 7619-50% ostial D1, PCW of 12, EF of 35-40%;  c. EF of 0.25 in 11/2003;  d. 40-45% in 05/2004;  e. 25% in 10/2008 by echo;  f. 04/2015 Echo: EF 40-45%, diff HK, sev inflat/inf HK, Gr 1 DD, mild AI, triv TR.  . Carotid artery occlusion   . Cerebrovascular disease    Carotid ultrasound in 12/2010-60-79% left internal carotid artery, less than 40% or right RICA, no change  . Chronic kidney disease    Creatinine of 1.6 in 2009  . Hyperlipidemia 10/15/2011   Lipid profile in 03/2009:231, 136, 56, 148  . Left bundle branch block   . Prostate carcinoma ([place])   . Syncope    [place] CRT-P 07/03/15 Dr. Lovena Le    Past Surgical History:  Procedure Laterality Date  . CATARACT EXTRACTION     Right  . COLONOSCOPY N/A 08/16/2012   Procedure: COLONOSCOPY;  Surgeon: Rogene Houston, MD;  [facility];  Service: Endoscopy;  Laterality: N/A;  930  . COLONOSCOPY N/A 10/21/2015   Procedure: COLONOSCOPY;  Surgeon: Rogene Houston, MD;  [facility];  Service: Endoscopy;  Laterality: N/A;  1200  . COLONOSCOPY W/ POLYPECTOMY  2010   Diverticulosis  . EP IMPLANTABLE DEVICE N/A 07/03/2015   Procedure: BiV Pacemaker Insertion CRT-P;  Surgeon: Evans Lance, MD;  Location: [facility];  Service: Cardiovascular;  Laterality: N/A;  . EYE SURGERY Right    Cataract  . HERNIA REPAIR      Social History   Socioeconomic History  . Marital status: Married     Spouse name: Not on file  . Number of children: Not on file  . Years of education: Not on file  . Highest education level: Not on file  Occupational History  . Not on file  Tobacco Use  . Smoking status: Former Smoker    Packs/day: 1.00    Years: 30.00    Pack years: 30.00    Quit date: 09/28/1973    Years since quitting: 46.7  . Smokeless tobacco: Never Used  Vaping Use  . Vaping Use: Never used  Substance and Sexual Activity  . Alcohol use: Yes    Alcohol/week: 5.0 standard drinks    Types: 5 Glasses of wine per week    Comment: History of excessive alcohol use; Alternates between glass of wine vs. Liquor drink 5 days per week (04/2015)  . Drug use: No  . Sexual activity: Not on file  Other Topics Concern  . Not on file  Social History Narrative  . Not on file   Social Determinants of Health   Financial Resource Strain: Not on file  Food Insecurity: Not on file  Transportation Needs: Not on file  Physical Activity: Not on file  Stress: Not on file  Social Connections: Not on file  Intimate Partner Violence: Not on file  Family History  Problem Relation Age of Onset  . Hypertension Mother       VITAL SIGNS BP 118/74   Pulse 71   Temp 98.4 F (36.9 C)   Resp 20   Ht 6\' 1"  (1.854 m)   Wt 169 lb 12.8 oz (77 kg)   SpO2 (!) 88%   BMI 22.40 kg/m   Outpatient Encounter Medications as of 06/09/2020  Medication Sig  . acetaminophen (TYLENOL) 500 MG tablet Take 500 mg by mouth every 6 (six) hours as needed.  . Amino Acids-Protein Hydrolys (FEEDING SUPPLEMENT, PRO-STAT 64,) LIQD Take 30 mLs by mouth in the morning and at bedtime.  Marland Kitchen apixaban (ELIQUIS) 2.5 MG TABS tablet Take 1 tablet (2.5 mg total) by mouth 2 (two) times daily.  . carvedilol (COREG) 6.25 MG tablet Take 1 tablet (6.25 mg total) by mouth 2 (two) times daily with a meal.  . dextromethorphan-guaiFENesin (MUCINEX DM) 30-600 MG 12hr tablet Take 1 tablet by mouth 2 (two) times daily for 20 days.  Marland Kitchen  docusate sodium (COLACE) 100 MG capsule Take 1 capsule (100 mg total) by mouth 2 (two) times daily.  . ferrous sulfate 325 (65 FE) MG tablet Take 325 mg by mouth daily with breakfast.  . furosemide (LASIX) 20 MG tablet Take 1 tablet (20 mg total) by mouth daily.  . Ipratropium-Albuterol (COMBIVENT RESPIMAT) 20-100 MCG/ACT AERS respimat Inhale 1 puff into the lungs every 6 (six) hours as needed for wheezing or shortness of breath.  . levofloxacin (LEVAQUIN) 750 MG tablet Take 750 mg by mouth daily.  . methocarbamol (ROBAXIN) 500 MG tablet Take 1 tablet (500 mg total) by mouth 3 (three) times daily.  . NON FORMULARY Diet: ____ Regular, ___x___ NAS, _______Consistent Carbohydrate, _______NPO _____Other  . OXYGEN Inhale 4 L into the lungs continuous.  . polyethylene glycol (MIRALAX / GLYCOLAX) 17 g packet Take 17 g by mouth daily.  . pravastatin (PRAVACHOL) 20 MG tablet Take 1 tablet (20 mg total) by mouth every evening.  . predniSONE (DELTASONE) 20 MG tablet Take 40 mg by mouth daily for 1 week; then decrease by 10 mg weekly until down to 10 mg daily; keep taking 10 mg by mouth daily on to follow-up with pulmonologist.  . tamsulosin (FLOMAX) 0.4 MG CAPS capsule Take 0.4 mg by mouth See admin instructions. Every other day  . traZODone (DESYREL) 50 MG tablet Take 1 tablet (50 mg total) by mouth at bedtime as needed for sleep.   No facility-administered encounter medications on file as of 06/09/2020.     SIGNIFICANT DIAGNOSTIC EXAMS   PREVIOUS   05-22-20: chest x-ray:  AICD in stable position. Cardiomegaly with mild bilateral interstitial prominence suggesting interstitial edema. Pneumonitis cannot be excluded.  05-22-20: ct angio of chest:  1. Negative for pulmonary embolus. 2. Widespread abnormal although nonspecific pulmonary ground-glassopacity with trace layering pleural fluid. Acute viral/atypical respiratory infection not excluded. Chronic interstitial lung disease is also a  consideration. 3. Cardiomegaly with mild to moderate enlargement of the central pulmonary arteries raising the possibility of pulmonary artery hypertension.  4. Aortic Atherosclerosis   05-24-20: ct of abdomen and pelvis:  1. Mild fat stranding surrounding the proximal sigmoid colon may reflect acute diverticulitis. No evidence of complication. 2. Multiple urinary bladder diverticula with associated urinary bladder wall thickening, likely reflecting bladder outlet obstruction given the patient's large prostate. 3. Small bilateral pleural effusions with diffuse bilateral ground-glass opacities appear unchanged from prior exam. 4.Aortic Atherosclerosis  05-31-20: chest x-ray:  Persistent bilateral ground-glass airspace opacities, not significantly changed. Differential includes atypical pneumonias such as viral or fungal, interstitial pneumonias, edema related to volume overload/CHF, and chronic interstitial diseases. Favor pneumonia versus asymmetric pulmonary edema superimposed on chronic interstitial lung disease.  TODAY  06-08-20: chest x-ray: There is a left lower lobe infiltrate noted.  LABS REVIEWED PREVIOUS   05-22-20: wbc 9.9 hgb 11.3; hct 34.4; mcv 98.6 plt 343; glucose 101; bun 21; creat 1.72; k+ 3.9; na++ 134; ca 8.5 liver normal albumin 2.7 blood culture: no growth 05-25-20: wbc 12.0; hgb 10.2; hct 30.6; mcv 97.1 plt 480; glucose 103; bun 19; creat 1.36; k+ 3.9; na++ 130; ca 8.4 liver norm albumin 2.1 05-29-20: glucose 119; bun 38; creat 1.51; k+ 4.1; na++ 135; ca 8.0 ;liver normal albumin 2.2 mag 2.3 06-01-20: glucose 110; bun 41; creat 1.53; k+ 4.3; na++ 135; ca 8.0 liver normal albumin 2.3  06-02-20: wbc 22.5; hgb 12.4; hct 38.0; mcv 98.7 plt 629; glucose 191; bun 43; creat 1.53; k+ 4.1; na++ 135; ca 8.1  06-08-20: wbc 23.0; hgb 10.5; hct 32.3; mcv 98.8 plt 629; glucose 85; bun 35; creat 1.31; k+ 4.2; na++ 135; ca 7.7  NO NEW LABS.     Review of Systems  Constitutional:  Negative for malaise/fatigue.  Respiratory: Negative for cough and shortness of breath.   Cardiovascular: Negative for chest pain, palpitations and leg swelling.  Gastrointestinal: Negative for abdominal pain, constipation and heartburn.  Musculoskeletal: Negative for back pain, joint pain and myalgias.  Skin: Negative.   Neurological: Negative for dizziness.  Psychiatric/Behavioral: The patient is not nervous/anxious.     Physical Exam Constitutional:      General: He is not in acute distress.    Appearance: He is well-developed and well-nourished. He is not diaphoretic.  Neck:     Thyroid: No thyromegaly.  Cardiovascular:     Rate and Rhythm: Normal rate. Rhythm irregular.     Pulses: Intact distal pulses.     Heart sounds: Normal heart sounds.     Comments: Pace maker Pulmonary:     Effort: Pulmonary effort is normal. No respiratory distress.     Breath sounds: Rhonchi present.     Comments: 02 4-6 liters Abdominal:     General: Bowel sounds are normal. There is no distension.     Palpations: Abdomen is soft.     Tenderness: There is no abdominal tenderness.  Musculoskeletal:        General: No edema. Normal range of motion.     Cervical back: Neck supple.     Right lower leg: No edema.     Left lower leg: No edema.  Lymphadenopathy:     Cervical: No cervical adenopathy.  Skin:    General: Skin is warm and dry.  Neurological:     Mental Status: He is alert and oriented to person, place, and time.  Psychiatric:        Mood and Affect: Mood and affect and mood normal.      ASSESSMENT/ PLAN:  TODAY  1. Lobular pneumonia  Is worse Will begin levaquin 750 mg daily thought 06-25-20 Will continue to monitor his status.     MD is aware of resident's narcotic use and is in agreement with current plan of care. We will attempt to wean resident as appropriate.  Ok Edwards NP [facility]  Contact [phone] Monday through Friday 8am- 5pm  After  hours call [phone]

## 2020-06-10 ENCOUNTER — Encounter: Payer: Self-pay | Admitting: Adult Health

## 2020-06-10 ENCOUNTER — Non-Acute Institutional Stay (SKILLED_NURSING_FACILITY): Payer: Medicare Other | Admitting: Adult Health

## 2020-06-10 ENCOUNTER — Other Ambulatory Visit: Payer: Self-pay | Admitting: Adult Health

## 2020-06-10 DIAGNOSIS — J9621 Acute and chronic respiratory failure with hypoxia: Secondary | ICD-10-CM

## 2020-06-10 DIAGNOSIS — M5137 Other intervertebral disc degeneration, lumbosacral region: Secondary | ICD-10-CM

## 2020-06-10 DIAGNOSIS — I5042 Chronic combined systolic (congestive) and diastolic (congestive) heart failure: Secondary | ICD-10-CM

## 2020-06-10 DIAGNOSIS — N1832 Chronic kidney disease, stage 3b: Secondary | ICD-10-CM

## 2020-06-10 DIAGNOSIS — I700 Atherosclerosis of aorta: Secondary | ICD-10-CM | POA: Diagnosis not present

## 2020-06-10 DIAGNOSIS — J9611 Chronic respiratory failure with hypoxia: Secondary | ICD-10-CM | POA: Diagnosis not present

## 2020-06-10 DIAGNOSIS — M1611 Unilateral primary osteoarthritis, right hip: Secondary | ICD-10-CM

## 2020-06-10 MED ORDER — CARVEDILOL 6.25 MG PO TABS: 6.2500 mg | ORAL_TABLET | Freq: Two times a day (BID) | ORAL | 0 refills | Status: DC

## 2020-06-10 MED ORDER — TAMSULOSIN HCL 0.4 MG PO CAPS: 0.4000 mg | ORAL_CAPSULE | ORAL | 0 refills | Status: DC

## 2020-06-10 MED ORDER — LEVOFLOXACIN 750 MG PO TABS: 750.0000 mg | ORAL_TABLET | Freq: Every day | ORAL | 0 refills | Status: AC

## 2020-06-10 MED ORDER — COMBIVENT RESPIMAT 20-100 MCG/ACT IN AERS: 1.0000 | INHALATION_SPRAY | Freq: Four times a day (QID) | RESPIRATORY_TRACT | 0 refills | Status: DC | PRN

## 2020-06-10 MED ORDER — PRAVASTATIN SODIUM 20 MG PO TABS: 20.0000 mg | ORAL_TABLET | Freq: Every evening | ORAL | 0 refills | Status: AC

## 2020-06-10 MED ORDER — APIXABAN 2.5 MG PO TABS: 2.5000 mg | ORAL_TABLET | Freq: Two times a day (BID) | ORAL | 0 refills | Status: AC

## 2020-06-10 MED ORDER — FUROSEMIDE 20 MG PO TABS: 20.0000 mg | ORAL_TABLET | Freq: Every day | ORAL | 0 refills | Status: DC

## 2020-06-10 MED ORDER — METHOCARBAMOL 500 MG PO TABS
500.0000 mg | ORAL_TABLET | Freq: Three times a day (TID) | ORAL | 0 refills | Status: DC
Start: 2020-06-10 — End: 2021-04-20

## 2020-06-10 MED ORDER — TRAZODONE HCL 50 MG PO TABS: 50.0000 mg | ORAL_TABLET | Freq: Every evening | ORAL | 0 refills | Status: DC | PRN

## 2020-06-10 MED ORDER — FERROUS SULFATE 325 (65 FE) MG PO TABS: 325.0000 mg | ORAL_TABLET | Freq: Every day | ORAL | 0 refills | Status: AC

## 2020-06-10 MED ORDER — PREDNISONE 10 MG PO TABS: 10.0000 mg | ORAL_TABLET | Freq: Every day | ORAL | 0 refills | Status: DC

## 2020-06-10 NOTE — Progress Notes (Addendum)
Location:    [facility] Room Number: 129/P Place of Service:  SNF (31)    CODE STATUS: DNR  No Known Allergies  Chief Complaint  Patient presents with  . Discharge Note    Discharge Visit     HPI:  He is being discharged to home with home health for pt/ot/rn. He will need a hospital bed; 02 at 6 liters; and a wheelchair. He will need to follow up with his medical provider and will need his prescriptions sent to the pharmacy.  He had been hospitalized acute respiratory failure with pneumonia. He is now 02 dependent. He was admitted to this facility for short term rehab. He has participated in pt and ot to improve upon his level of independence with his adls. He is now ready to complete his therapy on a home health basis.   Past Medical History:  Diagnosis Date  . Atrial fibrillation ([place])   . Bradycardia   . Cardiomyopathy 2005   a. Possibly alcoholic; b. cath in 7591-63% ostial D1, PCW of 12, EF of 35-40%;  c. EF of 0.25 in 11/2003;  d. 40-45% in 05/2004;  e. 25% in 10/2008 by echo;  f. 04/2015 Echo: EF 40-45%, diff HK, sev inflat/inf HK, Gr 1 DD, mild AI, triv TR.  . Carotid artery occlusion   . Cerebrovascular disease    Carotid ultrasound in 12/2010-60-79% left internal carotid artery, less than 40% or right RICA, no change  . Chronic kidney disease    Creatinine of 1.6 in 2009  . Hyperlipidemia 10/15/2011   Lipid profile in 03/2009:231, 136, 56, 148  . Left bundle branch block   . Prostate carcinoma ([place])   . Syncope    [place] CRT-P 07/03/15 Dr. Lovena Le    Past Surgical History:  Procedure Laterality Date  . CATARACT EXTRACTION     Right  . COLONOSCOPY N/A 08/16/2012   Procedure: COLONOSCOPY;  Surgeon: Rogene [facility], MD;  [facility];  Service: Endoscopy;  Laterality: N/A;  930  . COLONOSCOPY N/A 10/21/2015   Procedure: COLONOSCOPY;  Surgeon: Rogene [facility], MD;  [facility];  Service: Endoscopy;  Laterality: N/A;  1200  .  COLONOSCOPY W/ POLYPECTOMY  2010   Diverticulosis  . EP IMPLANTABLE DEVICE N/A 07/03/2015   Procedure: BiV Pacemaker Insertion CRT-P;  Surgeon: Evans Lance, MD;  Location: [facility];  Service: Cardiovascular;  Laterality: N/A;  . EYE SURGERY Right    Cataract  . HERNIA REPAIR      Social History   Socioeconomic History  . Marital status: Married    Spouse name: Not on file  . Number of children: Not on file  . Years of education: Not on file  . Highest education level: Not on file  Occupational History  . Not on file  Tobacco Use  . Smoking status: Former Smoker    Packs/day: 1.00    Years: 30.00    Pack years: 30.00    Quit date: 09/28/1973    Years since quitting: 46.7  . Smokeless tobacco: Never Used  Vaping Use  . Vaping Use: Never used  Substance and Sexual Activity  . Alcohol use: Yes    Alcohol/week: 5.0 standard drinks    Types: 5 Glasses of wine per week    Comment: History of excessive alcohol use; Alternates between glass of wine vs. Liquor drink 5 days per week (04/2015)  . Drug use: No  . Sexual  activity: Not on file  Other Topics Concern  . Not on file  Social History Narrative  . Not on file   Social Determinants of Health   Financial Resource Strain: Not on file  Food Insecurity: Not on file  Transportation Needs: Not on file  Physical Activity: Not on file  Stress: Not on file  Social Connections: Not on file  Intimate Partner Violence: Not on file   Family History  Problem Relation Age of Onset  . Hypertension Mother     VITAL SIGNS BP (!) 106/58   Pulse 60   Temp (!) 97.5 F (36.4 C)   Resp 20   Ht 6\' 1"  (1.854 m)   Wt 169 lb 12.8 oz (77 kg)   SpO2 91%   BMI 22.40 kg/m   Patient's Medications  New Prescriptions   No medications on file  Previous Medications   ACETAMINOPHEN (TYLENOL) 500 MG TABLET    Take 500 mg by mouth every 6 (six) hours as needed.   AMINO ACIDS-PROTEIN HYDROLYS (FEEDING SUPPLEMENT, PRO-STAT 64,)  LIQD    Take 30 mLs by mouth in the morning and at bedtime.   APIXABAN (ELIQUIS) 2.5 MG TABS TABLET    Take 1 tablet (2.5 mg total) by mouth 2 (two) times daily.   CARVEDILOL (COREG) 6.25 MG TABLET    Take 1 tablet (6.25 mg total) by mouth 2 (two) times daily with a meal.   DEXTROMETHORPHAN-GUAIFENESIN (MUCINEX DM) 30-600 MG 12HR TABLET    Take 1 tablet by mouth 2 (two) times daily for 20 days.   DOCUSATE SODIUM (COLACE) 100 MG CAPSULE    Take 1 capsule (100 mg total) by mouth 2 (two) times daily.   FEEDING SUPPLEMENT (ENSURE ENLIVE / ENSURE PLUS) LIQD    Take 237 mLs by mouth 2 (two) times daily between meals.   FERROUS SULFATE 325 (65 FE) MG TABLET    Take 325 mg by mouth daily with breakfast.   FUROSEMIDE (LASIX) 20 MG TABLET    Take 1 tablet (20 mg total) by mouth daily.   IPRATROPIUM-ALBUTEROL (COMBIVENT RESPIMAT) 20-100 MCG/ACT AERS RESPIMAT    Inhale 1 puff into the lungs every 6 (six) hours as needed for wheezing or shortness of breath.   LEVOFLOXACIN (LEVAQUIN) 750 MG TABLET    Take 750 mg by mouth daily.   METHOCARBAMOL (ROBAXIN) 500 MG TABLET    Take 1 tablet (500 mg total) by mouth 3 (three) times daily.   NON FORMULARY    Diet: ____ Regular, ___x___ NAS, _______Consistent Carbohydrate, _______NPO _____Other   OXYGEN    Inhale 4 L into the lungs continuous.   POLYETHYLENE GLYCOL (MIRALAX / GLYCOLAX) 17 G PACKET    Take 17 g by mouth daily.   PRAVASTATIN (PRAVACHOL) 20 MG TABLET    Take 1 tablet (20 mg total) by mouth every evening.   PREDNISONE (DELTASONE) 20 MG TABLET    Take 40 mg by mouth daily for 1 week; then decrease by 10 mg weekly until down to 10 mg daily; keep taking 10 mg by mouth daily on to follow-up with pulmonologist.   TAMSULOSIN (FLOMAX) 0.4 MG CAPS CAPSULE    Take 0.4 mg by mouth See admin instructions. Every other day   TRAZODONE (DESYREL) 50 MG TABLET    Take 1 tablet (50 mg total) by mouth at bedtime as needed for sleep.  Modified Medications   No medications  on file  Discontinued Medications   No medications on file  SIGNIFICANT DIAGNOSTIC EXAMS  PREVIOUS   05-22-20: chest x-ray:  AICD in stable position. Cardiomegaly with mild bilateral interstitial prominence suggesting interstitial edema. Pneumonitis cannot be excluded.  05-22-20: ct angio of chest:  1. Negative for pulmonary embolus. 2. Widespread abnormal although nonspecific pulmonary ground-glassopacity with trace layering pleural fluid. Acute viral/atypical respiratory infection not excluded. Chronic interstitial lung disease is also a consideration. 3. Cardiomegaly with mild to moderate enlargement of the central pulmonary arteries raising the possibility of pulmonary artery hypertension.  4. Aortic Atherosclerosis   05-24-20: ct of abdomen and pelvis:  1. Mild fat stranding surrounding the proximal sigmoid colon may reflect acute diverticulitis. No evidence of complication. 2. Multiple urinary bladder diverticula with associated urinary bladder wall thickening, likely reflecting bladder outlet obstruction given the patient's large prostate. 3. Small bilateral pleural effusions with diffuse bilateral ground-glass opacities appear unchanged from prior exam. 4.Aortic Atherosclerosis  05-31-20: chest x-ray:  Persistent bilateral ground-glass airspace opacities, not significantly changed. Differential includes atypical pneumonias such as viral or fungal, interstitial pneumonias, edema related to volume overload/CHF, and chronic interstitial diseases. Favor pneumonia versus asymmetric pulmonary edema superimposed on chronic interstitial lung disease.  TODAY  06-08-20: chest x-ray: There is a left lower lobe infiltrate noted.  LABS REVIEWED PREVIOUS   05-22-20: wbc 9.9 hgb 11.3; hct 34.4; mcv 98.6 plt 343; glucose 101; bun 21; creat 1.72; k+ 3.9; na++ 134; ca 8.5 liver normal albumin 2.7 blood culture: no growth 05-25-20: wbc 12.0; hgb 10.2; hct 30.6; mcv 97.1 plt 480; glucose  103; bun 19; creat 1.36; k+ 3.9; na++ 130; ca 8.4 liver norm albumin 2.1 05-29-20: glucose 119; bun 38; creat 1.51; k+ 4.1; na++ 135; ca 8.0 ;liver normal albumin 2.2 mag 2.3 06-01-20: glucose 110; bun 41; creat 1.53; k+ 4.3; na++ 135; ca 8.0 liver normal albumin 2.3  06-02-20: wbc 22.5; hgb 12.4; hct 38.0; mcv 98.7 plt 629; glucose 191; bun 43; creat 1.53; k+ 4.1; na++ 135; ca 8.1  06-08-20: wbc 23.0; hgb 10.5; hct 32.3; mcv 98.8 plt 629; glucose 85; bun 35; creat 1.31; k+ 4.2; na++ 135; ca 7.7  NO NEW LABS.    Review of Systems  Constitutional: Negative for malaise/fatigue.  Respiratory: Positive for shortness of breath. Negative for cough.   Cardiovascular: Negative for chest pain, palpitations and leg swelling.  Gastrointestinal: Negative for abdominal pain, constipation and heartburn.  Musculoskeletal: Negative for back pain, joint pain and myalgias.  Skin: Negative.   Neurological: Negative for dizziness.  Psychiatric/Behavioral: The patient is not nervous/anxious.     Physical Exam Constitutional:      General: He is not in acute distress.    Appearance: He is well-developed and well-nourished. He is not diaphoretic.  Neck:     Thyroid: No thyromegaly.  Cardiovascular:     Rate and Rhythm: Normal rate. Rhythm irregular.     Pulses: Normal pulses and intact distal pulses.     Heart sounds: Normal heart sounds.     Comments: Pace maker  Pulmonary:     Effort: Pulmonary effort is normal. No respiratory distress.     Breath sounds: Normal breath sounds.     Comments: 02 6 liters  Abdominal:     General: Bowel sounds are normal. There is no distension.     Palpations: Abdomen is soft.     Tenderness: There is no abdominal tenderness.  Musculoskeletal:        General: No edema. Normal range of motion.     Cervical back: Neck  supple.     Right lower leg: No edema.     Left lower leg: No edema.  Lymphadenopathy:     Cervical: No cervical adenopathy.  Skin:    General: Skin  is warm and dry.  Neurological:     Mental Status: He is alert and oriented to person, place, and time.  Psychiatric:        Mood and Affect: Mood and affect and mood normal.      ASSESSMENT/ PLAN:   Patient is being discharged with the following home health services:  Pt/ot/rn: to evaluate and treat as indicated for gait balance strength adl training medication management.   Patient is being discharged with the following durable medical equipment:  Hospital bed to allow him to elevate the head of his bed >30 degrees which will allow him to maintain his 02 sat which cannot be achieved in a regular bed. He requires standard wheelchair with elevated leg rests; cushion; antitipper; brake extensions to allow him to maintain his current level of independence with his adls which cannot be achieved with a walker; cane; crutches can self propel wheelchair. 02 at 6L/[place] continuous 02 sat on room air 77% at rest with 02 at 6L/: 91 %   Patient has been advised to f/u with their PCP in 1-2 weeks to bring them up to date on their rehab stay.  Social services at facility was responsible for arranging this appointment.  Pt was provided with a 30 day supply of prescriptions for medications and refills must be obtained from their PCP.  For controlled substances, a more limited supply may be provided adequate until PCP appointment only.  A 30 day supply of his prescription medications have been sent to: [facility]   Time spent with patient: 45 minutes: dme; medications; home health.  Ok Edwards NP [facility]  Contact [phone] Monday through Friday 8am- 5pm  After hours call [phone]

## 2020-06-11 DIAGNOSIS — N1832 Chronic kidney disease, stage 3b: Secondary | ICD-10-CM | POA: Diagnosis not present

## 2020-06-11 DIAGNOSIS — I5042 Chronic combined systolic (congestive) and diastolic (congestive) heart failure: Secondary | ICD-10-CM | POA: Diagnosis not present

## 2020-06-11 DIAGNOSIS — J9621 Acute and chronic respiratory failure with hypoxia: Secondary | ICD-10-CM | POA: Diagnosis not present

## 2020-06-11 DIAGNOSIS — J9611 Chronic respiratory failure with hypoxia: Secondary | ICD-10-CM | POA: Diagnosis not present

## 2020-06-12 DIAGNOSIS — Z993 Dependence on wheelchair: Secondary | ICD-10-CM | POA: Diagnosis not present

## 2020-06-12 DIAGNOSIS — J9621 Acute and chronic respiratory failure with hypoxia: Secondary | ICD-10-CM | POA: Diagnosis not present

## 2020-06-12 DIAGNOSIS — N1832 Chronic kidney disease, stage 3b: Secondary | ICD-10-CM | POA: Diagnosis not present

## 2020-06-12 DIAGNOSIS — E785 Hyperlipidemia, unspecified: Secondary | ICD-10-CM | POA: Diagnosis not present

## 2020-06-12 DIAGNOSIS — I480 Paroxysmal atrial fibrillation: Secondary | ICD-10-CM | POA: Diagnosis not present

## 2020-06-12 DIAGNOSIS — E7800 Pure hypercholesterolemia, unspecified: Secondary | ICD-10-CM | POA: Diagnosis not present

## 2020-06-12 DIAGNOSIS — K5900 Constipation, unspecified: Secondary | ICD-10-CM | POA: Diagnosis not present

## 2020-06-12 DIAGNOSIS — I429 Cardiomyopathy, unspecified: Secondary | ICD-10-CM | POA: Diagnosis not present

## 2020-06-12 DIAGNOSIS — D631 Anemia in chronic kidney disease: Secondary | ICD-10-CM | POA: Diagnosis not present

## 2020-06-12 DIAGNOSIS — I130 Hypertensive heart and chronic kidney disease with heart failure and stage 1 through stage 4 chronic kidney disease, or unspecified chronic kidney disease: Secondary | ICD-10-CM | POA: Diagnosis not present

## 2020-06-12 DIAGNOSIS — I6522 Occlusion and stenosis of left carotid artery: Secondary | ICD-10-CM | POA: Diagnosis not present

## 2020-06-12 DIAGNOSIS — I5040 Unspecified combined systolic (congestive) and diastolic (congestive) heart failure: Secondary | ICD-10-CM | POA: Diagnosis not present

## 2020-06-12 DIAGNOSIS — D72829 Elevated white blood cell count, unspecified: Secondary | ICD-10-CM | POA: Insufficient documentation

## 2020-06-12 DIAGNOSIS — T462X1D Poisoning by other antidysrhythmic drugs, accidental (unintentional), subsequent encounter: Secondary | ICD-10-CM | POA: Diagnosis not present

## 2020-06-13 LAB — CULTURE, BLOOD (ROUTINE X 2)
Culture: NO GROWTH
Culture: NO GROWTH
Special Requests: ADEQUATE

## 2020-06-15 DIAGNOSIS — E7800 Pure hypercholesterolemia, unspecified: Secondary | ICD-10-CM | POA: Diagnosis not present

## 2020-06-15 DIAGNOSIS — T462X1D Poisoning by other antidysrhythmic drugs, accidental (unintentional), subsequent encounter: Secondary | ICD-10-CM | POA: Diagnosis not present

## 2020-06-15 DIAGNOSIS — Z993 Dependence on wheelchair: Secondary | ICD-10-CM | POA: Diagnosis not present

## 2020-06-15 DIAGNOSIS — K5900 Constipation, unspecified: Secondary | ICD-10-CM | POA: Diagnosis not present

## 2020-06-15 DIAGNOSIS — I5040 Unspecified combined systolic (congestive) and diastolic (congestive) heart failure: Secondary | ICD-10-CM | POA: Diagnosis not present

## 2020-06-15 DIAGNOSIS — I6522 Occlusion and stenosis of left carotid artery: Secondary | ICD-10-CM | POA: Diagnosis not present

## 2020-06-15 DIAGNOSIS — N1832 Chronic kidney disease, stage 3b: Secondary | ICD-10-CM | POA: Diagnosis not present

## 2020-06-15 DIAGNOSIS — I480 Paroxysmal atrial fibrillation: Secondary | ICD-10-CM | POA: Diagnosis not present

## 2020-06-15 DIAGNOSIS — E785 Hyperlipidemia, unspecified: Secondary | ICD-10-CM | POA: Diagnosis not present

## 2020-06-15 DIAGNOSIS — J9621 Acute and chronic respiratory failure with hypoxia: Secondary | ICD-10-CM | POA: Diagnosis not present

## 2020-06-15 DIAGNOSIS — I429 Cardiomyopathy, unspecified: Secondary | ICD-10-CM | POA: Diagnosis not present

## 2020-06-15 DIAGNOSIS — D631 Anemia in chronic kidney disease: Secondary | ICD-10-CM | POA: Diagnosis not present

## 2020-06-15 DIAGNOSIS — I130 Hypertensive heart and chronic kidney disease with heart failure and stage 1 through stage 4 chronic kidney disease, or unspecified chronic kidney disease: Secondary | ICD-10-CM | POA: Diagnosis not present

## 2020-06-16 DIAGNOSIS — I429 Cardiomyopathy, unspecified: Secondary | ICD-10-CM | POA: Diagnosis not present

## 2020-06-16 DIAGNOSIS — E785 Hyperlipidemia, unspecified: Secondary | ICD-10-CM | POA: Diagnosis not present

## 2020-06-16 DIAGNOSIS — J9621 Acute and chronic respiratory failure with hypoxia: Secondary | ICD-10-CM | POA: Diagnosis not present

## 2020-06-16 DIAGNOSIS — Z515 Encounter for palliative care: Secondary | ICD-10-CM | POA: Diagnosis not present

## 2020-06-16 DIAGNOSIS — I6522 Occlusion and stenosis of left carotid artery: Secondary | ICD-10-CM | POA: Diagnosis not present

## 2020-06-16 DIAGNOSIS — I480 Paroxysmal atrial fibrillation: Secondary | ICD-10-CM | POA: Diagnosis not present

## 2020-06-16 DIAGNOSIS — I130 Hypertensive heart and chronic kidney disease with heart failure and stage 1 through stage 4 chronic kidney disease, or unspecified chronic kidney disease: Secondary | ICD-10-CM | POA: Diagnosis not present

## 2020-06-16 DIAGNOSIS — E7800 Pure hypercholesterolemia, unspecified: Secondary | ICD-10-CM | POA: Diagnosis not present

## 2020-06-16 DIAGNOSIS — D631 Anemia in chronic kidney disease: Secondary | ICD-10-CM | POA: Diagnosis not present

## 2020-06-16 DIAGNOSIS — Z993 Dependence on wheelchair: Secondary | ICD-10-CM | POA: Diagnosis not present

## 2020-06-16 DIAGNOSIS — K5900 Constipation, unspecified: Secondary | ICD-10-CM | POA: Diagnosis not present

## 2020-06-16 DIAGNOSIS — R5381 Other malaise: Secondary | ICD-10-CM | POA: Diagnosis not present

## 2020-06-16 DIAGNOSIS — I5040 Unspecified combined systolic (congestive) and diastolic (congestive) heart failure: Secondary | ICD-10-CM | POA: Diagnosis not present

## 2020-06-16 DIAGNOSIS — T462X1D Poisoning by other antidysrhythmic drugs, accidental (unintentional), subsequent encounter: Secondary | ICD-10-CM | POA: Diagnosis not present

## 2020-06-16 DIAGNOSIS — N1832 Chronic kidney disease, stage 3b: Secondary | ICD-10-CM | POA: Diagnosis not present

## 2020-06-16 DIAGNOSIS — I5042 Chronic combined systolic (congestive) and diastolic (congestive) heart failure: Secondary | ICD-10-CM | POA: Diagnosis not present

## 2020-06-17 DIAGNOSIS — K5900 Constipation, unspecified: Secondary | ICD-10-CM | POA: Diagnosis not present

## 2020-06-17 DIAGNOSIS — J9621 Acute and chronic respiratory failure with hypoxia: Secondary | ICD-10-CM | POA: Diagnosis not present

## 2020-06-17 DIAGNOSIS — I6522 Occlusion and stenosis of left carotid artery: Secondary | ICD-10-CM | POA: Diagnosis not present

## 2020-06-17 DIAGNOSIS — E7800 Pure hypercholesterolemia, unspecified: Secondary | ICD-10-CM | POA: Diagnosis not present

## 2020-06-17 DIAGNOSIS — I5040 Unspecified combined systolic (congestive) and diastolic (congestive) heart failure: Secondary | ICD-10-CM | POA: Diagnosis not present

## 2020-06-17 DIAGNOSIS — I130 Hypertensive heart and chronic kidney disease with heart failure and stage 1 through stage 4 chronic kidney disease, or unspecified chronic kidney disease: Secondary | ICD-10-CM | POA: Diagnosis not present

## 2020-06-17 DIAGNOSIS — D631 Anemia in chronic kidney disease: Secondary | ICD-10-CM | POA: Diagnosis not present

## 2020-06-17 DIAGNOSIS — Z993 Dependence on wheelchair: Secondary | ICD-10-CM | POA: Diagnosis not present

## 2020-06-17 DIAGNOSIS — E785 Hyperlipidemia, unspecified: Secondary | ICD-10-CM | POA: Diagnosis not present

## 2020-06-17 DIAGNOSIS — I429 Cardiomyopathy, unspecified: Secondary | ICD-10-CM | POA: Diagnosis not present

## 2020-06-17 DIAGNOSIS — T462X1D Poisoning by other antidysrhythmic drugs, accidental (unintentional), subsequent encounter: Secondary | ICD-10-CM | POA: Diagnosis not present

## 2020-06-17 DIAGNOSIS — N1832 Chronic kidney disease, stage 3b: Secondary | ICD-10-CM | POA: Diagnosis not present

## 2020-06-17 DIAGNOSIS — I480 Paroxysmal atrial fibrillation: Secondary | ICD-10-CM | POA: Diagnosis not present

## 2020-06-18 DIAGNOSIS — T462X1D Poisoning by other antidysrhythmic drugs, accidental (unintentional), subsequent encounter: Secondary | ICD-10-CM | POA: Diagnosis not present

## 2020-06-18 DIAGNOSIS — D631 Anemia in chronic kidney disease: Secondary | ICD-10-CM | POA: Diagnosis not present

## 2020-06-18 DIAGNOSIS — Z993 Dependence on wheelchair: Secondary | ICD-10-CM | POA: Diagnosis not present

## 2020-06-18 DIAGNOSIS — N1832 Chronic kidney disease, stage 3b: Secondary | ICD-10-CM | POA: Diagnosis not present

## 2020-06-18 DIAGNOSIS — J9621 Acute and chronic respiratory failure with hypoxia: Secondary | ICD-10-CM | POA: Diagnosis not present

## 2020-06-18 DIAGNOSIS — I5040 Unspecified combined systolic (congestive) and diastolic (congestive) heart failure: Secondary | ICD-10-CM | POA: Diagnosis not present

## 2020-06-18 DIAGNOSIS — I429 Cardiomyopathy, unspecified: Secondary | ICD-10-CM | POA: Diagnosis not present

## 2020-06-18 DIAGNOSIS — I480 Paroxysmal atrial fibrillation: Secondary | ICD-10-CM | POA: Diagnosis not present

## 2020-06-18 DIAGNOSIS — I130 Hypertensive heart and chronic kidney disease with heart failure and stage 1 through stage 4 chronic kidney disease, or unspecified chronic kidney disease: Secondary | ICD-10-CM | POA: Diagnosis not present

## 2020-06-18 DIAGNOSIS — E7800 Pure hypercholesterolemia, unspecified: Secondary | ICD-10-CM | POA: Diagnosis not present

## 2020-06-18 DIAGNOSIS — I6522 Occlusion and stenosis of left carotid artery: Secondary | ICD-10-CM | POA: Diagnosis not present

## 2020-06-18 DIAGNOSIS — E785 Hyperlipidemia, unspecified: Secondary | ICD-10-CM | POA: Diagnosis not present

## 2020-06-18 DIAGNOSIS — K5900 Constipation, unspecified: Secondary | ICD-10-CM | POA: Diagnosis not present

## 2020-06-22 DIAGNOSIS — K5900 Constipation, unspecified: Secondary | ICD-10-CM | POA: Diagnosis not present

## 2020-06-22 DIAGNOSIS — T462X1D Poisoning by other antidysrhythmic drugs, accidental (unintentional), subsequent encounter: Secondary | ICD-10-CM | POA: Diagnosis not present

## 2020-06-22 DIAGNOSIS — I6522 Occlusion and stenosis of left carotid artery: Secondary | ICD-10-CM | POA: Diagnosis not present

## 2020-06-22 DIAGNOSIS — N1832 Chronic kidney disease, stage 3b: Secondary | ICD-10-CM | POA: Diagnosis not present

## 2020-06-22 DIAGNOSIS — Z993 Dependence on wheelchair: Secondary | ICD-10-CM | POA: Diagnosis not present

## 2020-06-22 DIAGNOSIS — I130 Hypertensive heart and chronic kidney disease with heart failure and stage 1 through stage 4 chronic kidney disease, or unspecified chronic kidney disease: Secondary | ICD-10-CM | POA: Diagnosis not present

## 2020-06-22 DIAGNOSIS — E785 Hyperlipidemia, unspecified: Secondary | ICD-10-CM | POA: Diagnosis not present

## 2020-06-22 DIAGNOSIS — I5040 Unspecified combined systolic (congestive) and diastolic (congestive) heart failure: Secondary | ICD-10-CM | POA: Diagnosis not present

## 2020-06-22 DIAGNOSIS — I480 Paroxysmal atrial fibrillation: Secondary | ICD-10-CM | POA: Diagnosis not present

## 2020-06-22 DIAGNOSIS — J9621 Acute and chronic respiratory failure with hypoxia: Secondary | ICD-10-CM | POA: Diagnosis not present

## 2020-06-22 DIAGNOSIS — D631 Anemia in chronic kidney disease: Secondary | ICD-10-CM | POA: Diagnosis not present

## 2020-06-22 DIAGNOSIS — E7800 Pure hypercholesterolemia, unspecified: Secondary | ICD-10-CM | POA: Diagnosis not present

## 2020-06-22 DIAGNOSIS — I429 Cardiomyopathy, unspecified: Secondary | ICD-10-CM | POA: Diagnosis not present

## 2020-06-23 DIAGNOSIS — E785 Hyperlipidemia, unspecified: Secondary | ICD-10-CM | POA: Diagnosis not present

## 2020-06-23 DIAGNOSIS — N1832 Chronic kidney disease, stage 3b: Secondary | ICD-10-CM | POA: Diagnosis not present

## 2020-06-23 DIAGNOSIS — J9621 Acute and chronic respiratory failure with hypoxia: Secondary | ICD-10-CM | POA: Diagnosis not present

## 2020-06-23 DIAGNOSIS — I6522 Occlusion and stenosis of left carotid artery: Secondary | ICD-10-CM | POA: Diagnosis not present

## 2020-06-23 DIAGNOSIS — Z993 Dependence on wheelchair: Secondary | ICD-10-CM | POA: Diagnosis not present

## 2020-06-23 DIAGNOSIS — E7800 Pure hypercholesterolemia, unspecified: Secondary | ICD-10-CM | POA: Diagnosis not present

## 2020-06-23 DIAGNOSIS — I480 Paroxysmal atrial fibrillation: Secondary | ICD-10-CM | POA: Diagnosis not present

## 2020-06-23 DIAGNOSIS — K5900 Constipation, unspecified: Secondary | ICD-10-CM | POA: Diagnosis not present

## 2020-06-23 DIAGNOSIS — T462X1D Poisoning by other antidysrhythmic drugs, accidental (unintentional), subsequent encounter: Secondary | ICD-10-CM | POA: Diagnosis not present

## 2020-06-23 DIAGNOSIS — I429 Cardiomyopathy, unspecified: Secondary | ICD-10-CM | POA: Diagnosis not present

## 2020-06-23 DIAGNOSIS — I130 Hypertensive heart and chronic kidney disease with heart failure and stage 1 through stage 4 chronic kidney disease, or unspecified chronic kidney disease: Secondary | ICD-10-CM | POA: Diagnosis not present

## 2020-06-23 DIAGNOSIS — D631 Anemia in chronic kidney disease: Secondary | ICD-10-CM | POA: Diagnosis not present

## 2020-06-23 DIAGNOSIS — I5040 Unspecified combined systolic (congestive) and diastolic (congestive) heart failure: Secondary | ICD-10-CM | POA: Diagnosis not present

## 2020-06-24 DIAGNOSIS — I130 Hypertensive heart and chronic kidney disease with heart failure and stage 1 through stage 4 chronic kidney disease, or unspecified chronic kidney disease: Secondary | ICD-10-CM | POA: Diagnosis not present

## 2020-06-24 DIAGNOSIS — T462X1D Poisoning by other antidysrhythmic drugs, accidental (unintentional), subsequent encounter: Secondary | ICD-10-CM | POA: Diagnosis not present

## 2020-06-24 DIAGNOSIS — I6522 Occlusion and stenosis of left carotid artery: Secondary | ICD-10-CM | POA: Diagnosis not present

## 2020-06-24 DIAGNOSIS — D631 Anemia in chronic kidney disease: Secondary | ICD-10-CM | POA: Diagnosis not present

## 2020-06-24 DIAGNOSIS — I429 Cardiomyopathy, unspecified: Secondary | ICD-10-CM | POA: Diagnosis not present

## 2020-06-24 DIAGNOSIS — E785 Hyperlipidemia, unspecified: Secondary | ICD-10-CM | POA: Diagnosis not present

## 2020-06-24 DIAGNOSIS — K5900 Constipation, unspecified: Secondary | ICD-10-CM | POA: Diagnosis not present

## 2020-06-24 DIAGNOSIS — I480 Paroxysmal atrial fibrillation: Secondary | ICD-10-CM | POA: Diagnosis not present

## 2020-06-24 DIAGNOSIS — J9621 Acute and chronic respiratory failure with hypoxia: Secondary | ICD-10-CM | POA: Diagnosis not present

## 2020-06-24 DIAGNOSIS — I5040 Unspecified combined systolic (congestive) and diastolic (congestive) heart failure: Secondary | ICD-10-CM | POA: Diagnosis not present

## 2020-06-24 DIAGNOSIS — E7800 Pure hypercholesterolemia, unspecified: Secondary | ICD-10-CM | POA: Diagnosis not present

## 2020-06-24 DIAGNOSIS — Z993 Dependence on wheelchair: Secondary | ICD-10-CM | POA: Diagnosis not present

## 2020-06-24 DIAGNOSIS — N1832 Chronic kidney disease, stage 3b: Secondary | ICD-10-CM | POA: Diagnosis not present

## 2020-06-25 DIAGNOSIS — I429 Cardiomyopathy, unspecified: Secondary | ICD-10-CM | POA: Diagnosis not present

## 2020-06-25 DIAGNOSIS — E7800 Pure hypercholesterolemia, unspecified: Secondary | ICD-10-CM | POA: Diagnosis not present

## 2020-06-25 DIAGNOSIS — Z993 Dependence on wheelchair: Secondary | ICD-10-CM | POA: Diagnosis not present

## 2020-06-25 DIAGNOSIS — T462X1D Poisoning by other antidysrhythmic drugs, accidental (unintentional), subsequent encounter: Secondary | ICD-10-CM | POA: Diagnosis not present

## 2020-06-25 DIAGNOSIS — I6522 Occlusion and stenosis of left carotid artery: Secondary | ICD-10-CM | POA: Diagnosis not present

## 2020-06-25 DIAGNOSIS — I130 Hypertensive heart and chronic kidney disease with heart failure and stage 1 through stage 4 chronic kidney disease, or unspecified chronic kidney disease: Secondary | ICD-10-CM | POA: Diagnosis not present

## 2020-06-25 DIAGNOSIS — K5900 Constipation, unspecified: Secondary | ICD-10-CM | POA: Diagnosis not present

## 2020-06-25 DIAGNOSIS — I480 Paroxysmal atrial fibrillation: Secondary | ICD-10-CM | POA: Diagnosis not present

## 2020-06-25 DIAGNOSIS — E785 Hyperlipidemia, unspecified: Secondary | ICD-10-CM | POA: Diagnosis not present

## 2020-06-25 DIAGNOSIS — J9621 Acute and chronic respiratory failure with hypoxia: Secondary | ICD-10-CM | POA: Diagnosis not present

## 2020-06-25 DIAGNOSIS — N1832 Chronic kidney disease, stage 3b: Secondary | ICD-10-CM | POA: Diagnosis not present

## 2020-06-25 DIAGNOSIS — I5040 Unspecified combined systolic (congestive) and diastolic (congestive) heart failure: Secondary | ICD-10-CM | POA: Diagnosis not present

## 2020-06-25 DIAGNOSIS — D631 Anemia in chronic kidney disease: Secondary | ICD-10-CM | POA: Diagnosis not present

## 2020-07-01 DIAGNOSIS — T462X1D Poisoning by other antidysrhythmic drugs, accidental (unintentional), subsequent encounter: Secondary | ICD-10-CM | POA: Diagnosis not present

## 2020-07-01 DIAGNOSIS — I5022 Chronic systolic (congestive) heart failure: Secondary | ICD-10-CM | POA: Diagnosis not present

## 2020-07-01 DIAGNOSIS — I480 Paroxysmal atrial fibrillation: Secondary | ICD-10-CM | POA: Diagnosis not present

## 2020-07-01 DIAGNOSIS — J9621 Acute and chronic respiratory failure with hypoxia: Secondary | ICD-10-CM | POA: Diagnosis not present

## 2020-07-01 DIAGNOSIS — I130 Hypertensive heart and chronic kidney disease with heart failure and stage 1 through stage 4 chronic kidney disease, or unspecified chronic kidney disease: Secondary | ICD-10-CM | POA: Diagnosis not present

## 2020-07-01 DIAGNOSIS — I5040 Unspecified combined systolic (congestive) and diastolic (congestive) heart failure: Secondary | ICD-10-CM | POA: Diagnosis not present

## 2020-07-01 DIAGNOSIS — N1832 Chronic kidney disease, stage 3b: Secondary | ICD-10-CM | POA: Diagnosis not present

## 2020-07-01 DIAGNOSIS — D631 Anemia in chronic kidney disease: Secondary | ICD-10-CM | POA: Diagnosis not present

## 2020-07-01 DIAGNOSIS — J9692 Respiratory failure, unspecified with hypercapnia: Secondary | ICD-10-CM | POA: Diagnosis not present

## 2020-07-01 DIAGNOSIS — I6522 Occlusion and stenosis of left carotid artery: Secondary | ICD-10-CM | POA: Diagnosis not present

## 2020-07-01 DIAGNOSIS — E785 Hyperlipidemia, unspecified: Secondary | ICD-10-CM | POA: Diagnosis not present

## 2020-07-02 DIAGNOSIS — I130 Hypertensive heart and chronic kidney disease with heart failure and stage 1 through stage 4 chronic kidney disease, or unspecified chronic kidney disease: Secondary | ICD-10-CM | POA: Diagnosis not present

## 2020-07-02 DIAGNOSIS — N1832 Chronic kidney disease, stage 3b: Secondary | ICD-10-CM | POA: Diagnosis not present

## 2020-07-02 DIAGNOSIS — I6522 Occlusion and stenosis of left carotid artery: Secondary | ICD-10-CM | POA: Diagnosis not present

## 2020-07-02 DIAGNOSIS — D631 Anemia in chronic kidney disease: Secondary | ICD-10-CM | POA: Diagnosis not present

## 2020-07-02 DIAGNOSIS — E785 Hyperlipidemia, unspecified: Secondary | ICD-10-CM | POA: Diagnosis not present

## 2020-07-02 DIAGNOSIS — J9621 Acute and chronic respiratory failure with hypoxia: Secondary | ICD-10-CM | POA: Diagnosis not present

## 2020-07-02 DIAGNOSIS — I480 Paroxysmal atrial fibrillation: Secondary | ICD-10-CM | POA: Diagnosis not present

## 2020-07-02 DIAGNOSIS — T462X1D Poisoning by other antidysrhythmic drugs, accidental (unintentional), subsequent encounter: Secondary | ICD-10-CM | POA: Diagnosis not present

## 2020-07-02 DIAGNOSIS — I5040 Unspecified combined systolic (congestive) and diastolic (congestive) heart failure: Secondary | ICD-10-CM | POA: Diagnosis not present

## 2020-07-03 ENCOUNTER — Encounter: Payer: Self-pay | Admitting: Pulmonary Disease

## 2020-07-03 ENCOUNTER — Ambulatory Visit: Payer: Medicare Other | Admitting: Pulmonary Disease

## 2020-07-03 ENCOUNTER — Ambulatory Visit (HOSPITAL_COMMUNITY)
Admission: RE | Admit: 2020-07-03 | Discharge: 2020-07-03 | Disposition: A | Discharge status: Home / Self Care | Payer: Medicare HMO | Source: Ambulatory Visit | Attending: Pulmonary Disease | Admitting: Pulmonary Disease

## 2020-07-03 ENCOUNTER — Other Ambulatory Visit: Payer: Self-pay

## 2020-07-03 ENCOUNTER — Telehealth: Payer: Self-pay | Admitting: Pulmonary Disease

## 2020-07-03 VITALS — BP 130/62 | HR 89 | Temp 97.0°F | Ht 72.0 in | Wt 168.4 lb

## 2020-07-03 DIAGNOSIS — J984 Other disorders of lung: Secondary | ICD-10-CM

## 2020-07-03 DIAGNOSIS — Z7189 Other specified counseling: Secondary | ICD-10-CM

## 2020-07-03 DIAGNOSIS — J9611 Chronic respiratory failure with hypoxia: Secondary | ICD-10-CM | POA: Diagnosis not present

## 2020-07-03 DIAGNOSIS — R918 Other nonspecific abnormal finding of lung field: Secondary | ICD-10-CM | POA: Diagnosis not present

## 2020-07-03 DIAGNOSIS — T462X5A Adverse effect of other antidysrhythmic drugs, initial encounter: Secondary | ICD-10-CM | POA: Diagnosis not present

## 2020-07-03 MED ORDER — PREDNISONE 5 MG PO TABS: ORAL_TABLET | ORAL | 0 refills | Status: DC

## 2020-07-03 NOTE — Telephone Encounter (Signed)
Called and went over xray results per Dr Halford Chessman with patient daughter, Santiago Glad) per [facility]. All questions answered and Santiago Glad expressed full understanding of results and recommendation for prednisone order. Santiago Glad stated patient will need prednisone script since the are running out of it. Order/script for prednisone sent to [facility] per Dr Halford Chessman.   Santiago Glad had a couple of questions for Dr Halford Chessman. Patient is currently on Mucinex BID and she was wondering if patient should continue taking BID or change to take just daily? Dr Halford Chessman please advise.  Also Santiago Glad was wondering what is Dr Halford Chessman recommendation as to what should the patient "goal saturations" be while resting and with activity while on the O2. (example between 92-94%, titrate O2 to keep level)? Dr Halford Chessman please advise.

## 2020-07-03 NOTE — Patient Instructions (Signed)
Will arrange for chest xray and call with results; will discuss plan for prednisone after review of your chest xray.  Okay to get COVID vaccine booster.  Follow up in 8 weeks.

## 2020-07-03 NOTE — Telephone Encounter (Signed)
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 Prior CT 05/22/2020 07/09/2015 FINDINGS: Cardiomediastinal silhouette unchanged. Low lung volumes persist. Similar appearance of reticular pattern of opacity of the bilateral lungs, predominantly at the periphery and the lung bases. Pattern is new from the plain film 07/09/2015 and unchanged from 05/22/2020 No pneumothorax. No large pleural effusion. Unchanged cardiac pacing device IMPRESSION: Similar appearance of low lung volumes and reticular opacity, relatively unchanged dating to 05/22/2020, and new from 07/09/2015. The differential diagnosis includes infectious etiology (including atypical infection), as well as chronic changes of interstitial fibrosis (including drug toxicities). Electronically Signed   By: Corrie Mckusick D.O.   On: 07/03/2020 13:13     Please let him know his chest xray looks about the same compared to when he was in the hospital - no progression since he has reduced dose of steroids.  He should change prednisone to 5 mg daily for 2 weeks, then 5 mg every other day for 2 weeks, and then stop prednisone.

## 2020-07-03 NOTE — Progress Notes (Signed)
[facility]  Chief Complaint  Patient presents with  . Follow-up    Shortness of breath with exertion    Constitutional:  BP 130/62 (BP Location: Left Arm, Cuff Size: Normal)   Pulse 89   Temp (!) 97 F (36.1 C) (Other (Comment)) Comment (Src): wrist  Ht 6' (1.829 m)   Wt 168 lb 6.4 oz (76.4 kg)   SpO2 91% Comment: 3L O2  BMI 22.84 kg/m   Past Medical History:  Systolic CHF status postBiV PPM, CKD 3a, Carotid stenosis, HLD,PAF, HTN  Past Surgical History:  He  has a past surgical history that includes Cataract extraction; Colonoscopy w/ polypectomy (2010); Colonoscopy (N/A, 08/16/2012); Eye surgery (Right); Cardiac catheterization (N/A, 07/03/2015); Hernia repair; and Colonoscopy (N/A, 10/21/2015).  Brief Summary:  Max Cole is a 87 y.o. 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      Subjective:   He was at the [facility] for 1 week, and back home now.  Slowly regaining his strength.  On 2 liters at rest, but up to 5 liters with activity.  SpO2 sometimes drops into low 80's but comes up quickly when he rests.  Not having cough, wheeze, sputum.  His daughter noticed some crepitus in his chest on Rt side.  He was treated for a pneumonia with levaquin while at the [facility].  Physical Exam:   Appearance - wearing oxygen  ENMT - no sinus tenderness, no oral exudate, no LAN, Mallampati 2 airway, no stridor  Respiratory - coarse breath sounds b/l, no wheeze  CV - s1s2 regular rate and rhythm, no murmurs  Ext - ankle edema  Skin - multiple areas of ecchymosis  Psych - normal mood and affect   Pulmonary testing:   ANA 05/24/20 >> negative  A1AT 06/01/20 >> 159  Chest Imaging:    Cardiac Tests:   Echo 02/07/20 >> EF 50 to 55%, grade 1 DD, mild elevation in PASP, severe LA dilation, mod RA dilation  Social History:  He  reports that he quit smoking about 46 years ago.  He has a 30.00 pack-year smoking history. He has never used smokeless tobacco. He reports current alcohol use of about 5.0 standard drinks of alcohol per week. He reports that he does not use drugs.  Family History:  His family history includes Hypertension in his mother.     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 - repeat chest xray today and then determine plan for further prednisone taper - goal SpO2 > 90%  Atrial fibrillation. - followed by Dr. Cristopher Peru with [place] [facility] advice. - advised him to get COVID 19 vaccine booster - his daughter was exposed to [place] before Christmas in [place]; she tested positive; she has been fully vaccinated and boosted and not having symptoms; since it has been more than 2 weeks since her diagnosis it should be okay for her to visit her parents  Goals of care. - DNR/DNI  Time Spent Involved in Patient Care on Day of Examination:  31 minutes  Follow up:  Patient Instructions  Will arrange for chest xray and call with results; will discuss plan for prednisone after review of your chest xray.  Okay to get COVID vaccine booster.  Follow up in 8 weeks.   Medication List:   Allergies as of 07/03/2020   No Known Allergies     Medication List  Accurate as of July 03, 2020 10:04 AM. If you have any questions, ask your nurse or doctor.        acetaminophen 500 MG tablet Commonly known as: TYLENOL Take 500 mg by mouth every 6 (six) hours as needed.   apixaban 2.5 MG Tabs tablet Commonly known as: ELIQUIS Take 1 tablet (2.5 mg total) by mouth 2 (two) times daily.   carvedilol 6.25 MG tablet Commonly known as: COREG Take 1 tablet (6.25 mg total) by mouth 2 (two) times daily with a meal.   Combivent Respimat 20-100 MCG/ACT Aers respimat Generic drug: Ipratropium-Albuterol Inhale 1 puff into the lungs every 6 (six) hours as needed for wheezing or shortness of  breath.   docusate sodium 100 MG capsule Commonly known as: Colace Take 1 capsule (100 mg total) by mouth 2 (two) times daily.   feeding supplement (PRO-STAT 64) Liqd Take 30 mLs by mouth in the morning and at bedtime.   feeding supplement Liqd Take 237 mLs by mouth 2 (two) times daily between meals.   ferrous sulfate 325 (65 FE) MG tablet Take 1 tablet (325 mg total) by mouth daily with breakfast.   furosemide 20 MG tablet Commonly known as: LASIX Take 1 tablet (20 mg total) by mouth daily.   methocarbamol 500 MG tablet Commonly known as: ROBAXIN Take 1 tablet (500 mg total) by mouth 3 (three) times daily.   NON FORMULARY Diet: ____ Regular, ___x___ NAS, _______Consistent Carbohydrate, _______NPO _____Other   OXYGEN Inhale 4 L into the lungs continuous.   polyethylene glycol 17 g packet Commonly known as: MIRALAX / GLYCOLAX Take 17 g by mouth daily.   pravastatin 20 MG tablet Commonly known as: PRAVACHOL Take 1 tablet (20 mg total) by mouth every evening.   predniSONE 10 MG tablet Commonly known as: DELTASONE Take 1 tablet (10 mg total) by mouth daily with breakfast.   tamsulosin 0.4 MG Caps capsule Commonly known as: FLOMAX Take 1 capsule (0.4 mg total) by mouth See admin instructions. Every other day   traZODone 50 MG tablet Commonly known as: DESYREL Take 1 tablet (50 mg total) by mouth at bedtime as needed for sleep.       Signature:  Chesley Mires, MD [place] Pager - [phone] 07/03/2020, 10:04 AM

## 2020-07-03 NOTE — Telephone Encounter (Signed)
Called Santiago Glad (per [facility]) and went over Dr Juanetta Gosling recommendations per Dr Halford Chessman. All questions answered and Santiago Glad expressed full understanding. Nothing further needed at this time.

## 2020-07-03 NOTE — Telephone Encounter (Signed)
Can use mucinex bid prn for cough, or chest congestion.  Goal SpO2 is > 90%.

## 2020-07-06 DIAGNOSIS — J9621 Acute and chronic respiratory failure with hypoxia: Secondary | ICD-10-CM | POA: Diagnosis not present

## 2020-07-06 DIAGNOSIS — I6522 Occlusion and stenosis of left carotid artery: Secondary | ICD-10-CM | POA: Diagnosis not present

## 2020-07-06 DIAGNOSIS — D631 Anemia in chronic kidney disease: Secondary | ICD-10-CM | POA: Diagnosis not present

## 2020-07-06 DIAGNOSIS — T462X1D Poisoning by other antidysrhythmic drugs, accidental (unintentional), subsequent encounter: Secondary | ICD-10-CM | POA: Diagnosis not present

## 2020-07-06 DIAGNOSIS — I130 Hypertensive heart and chronic kidney disease with heart failure and stage 1 through stage 4 chronic kidney disease, or unspecified chronic kidney disease: Secondary | ICD-10-CM | POA: Diagnosis not present

## 2020-07-06 DIAGNOSIS — N1832 Chronic kidney disease, stage 3b: Secondary | ICD-10-CM | POA: Diagnosis not present

## 2020-07-06 DIAGNOSIS — I480 Paroxysmal atrial fibrillation: Secondary | ICD-10-CM | POA: Diagnosis not present

## 2020-07-06 DIAGNOSIS — E785 Hyperlipidemia, unspecified: Secondary | ICD-10-CM | POA: Diagnosis not present

## 2020-07-06 DIAGNOSIS — I5040 Unspecified combined systolic (congestive) and diastolic (congestive) heart failure: Secondary | ICD-10-CM | POA: Diagnosis not present

## 2020-07-08 DIAGNOSIS — T462X1D Poisoning by other antidysrhythmic drugs, accidental (unintentional), subsequent encounter: Secondary | ICD-10-CM | POA: Diagnosis not present

## 2020-07-08 DIAGNOSIS — E785 Hyperlipidemia, unspecified: Secondary | ICD-10-CM | POA: Diagnosis not present

## 2020-07-08 DIAGNOSIS — I5040 Unspecified combined systolic (congestive) and diastolic (congestive) heart failure: Secondary | ICD-10-CM | POA: Diagnosis not present

## 2020-07-08 DIAGNOSIS — I130 Hypertensive heart and chronic kidney disease with heart failure and stage 1 through stage 4 chronic kidney disease, or unspecified chronic kidney disease: Secondary | ICD-10-CM | POA: Diagnosis not present

## 2020-07-08 DIAGNOSIS — I6522 Occlusion and stenosis of left carotid artery: Secondary | ICD-10-CM | POA: Diagnosis not present

## 2020-07-08 DIAGNOSIS — J9621 Acute and chronic respiratory failure with hypoxia: Secondary | ICD-10-CM | POA: Diagnosis not present

## 2020-07-08 DIAGNOSIS — I480 Paroxysmal atrial fibrillation: Secondary | ICD-10-CM | POA: Diagnosis not present

## 2020-07-08 DIAGNOSIS — N1832 Chronic kidney disease, stage 3b: Secondary | ICD-10-CM | POA: Diagnosis not present

## 2020-07-08 DIAGNOSIS — D631 Anemia in chronic kidney disease: Secondary | ICD-10-CM | POA: Diagnosis not present

## 2020-07-09 ENCOUNTER — Ambulatory Visit: Payer: Medicare Other | Attending: Internal Medicine

## 2020-07-09 DIAGNOSIS — Z23 Encounter for immunization: Secondary | ICD-10-CM

## 2020-07-09 NOTE — Progress Notes (Signed)
   Covid-19 Vaccination Clinic  Name:  Max Cole    MRN: [id number] DOB: 1927/11/28  07/09/2020  Max Cole was observed post Covid-19 immunization for 15 minutes without incident. He was provided with Vaccine Information Sheet and instruction to access the V-Safe system.   Max Cole was instructed to call 911 with any severe reactions post vaccine: Marland Kitchen Difficulty breathing  . Swelling of face and throat  . A fast heartbeat  . A bad rash all over body  . Dizziness and weakness   Immunizations Administered    Name Date Dose VIS Date Route   Moderna Covid-19 Booster Vaccine 07/09/2020  1:32 PM 0.25 mL 04/15/2020 Intramuscular   Manufacturer: Moderna   Lot: [id number]   [place]: [id number]

## 2020-07-12 DIAGNOSIS — I5042 Chronic combined systolic (congestive) and diastolic (congestive) heart failure: Secondary | ICD-10-CM | POA: Diagnosis not present

## 2020-07-12 DIAGNOSIS — J9611 Chronic respiratory failure with hypoxia: Secondary | ICD-10-CM | POA: Diagnosis not present

## 2020-07-12 DIAGNOSIS — J9621 Acute and chronic respiratory failure with hypoxia: Secondary | ICD-10-CM | POA: Diagnosis not present

## 2020-07-12 DIAGNOSIS — N1832 Chronic kidney disease, stage 3b: Secondary | ICD-10-CM | POA: Diagnosis not present

## 2020-07-15 ENCOUNTER — Other Ambulatory Visit: Payer: Self-pay

## 2020-07-15 ENCOUNTER — Encounter: Payer: Self-pay | Admitting: Internal Medicine

## 2020-07-15 ENCOUNTER — Ambulatory Visit (INDEPENDENT_AMBULATORY_CARE_PROVIDER_SITE_OTHER): Payer: Medicare HMO | Admitting: Internal Medicine

## 2020-07-15 VITALS — BP 122/64 | HR 100 | Ht 73.0 in | Wt 168.0 lb

## 2020-07-15 DIAGNOSIS — N1832 Chronic kidney disease, stage 3b: Secondary | ICD-10-CM | POA: Diagnosis not present

## 2020-07-15 DIAGNOSIS — J9621 Acute and chronic respiratory failure with hypoxia: Secondary | ICD-10-CM | POA: Diagnosis not present

## 2020-07-15 DIAGNOSIS — E785 Hyperlipidemia, unspecified: Secondary | ICD-10-CM | POA: Diagnosis not present

## 2020-07-15 DIAGNOSIS — I428 Other cardiomyopathies: Secondary | ICD-10-CM | POA: Diagnosis not present

## 2020-07-15 DIAGNOSIS — J9611 Chronic respiratory failure with hypoxia: Secondary | ICD-10-CM | POA: Diagnosis not present

## 2020-07-15 DIAGNOSIS — I130 Hypertensive heart and chronic kidney disease with heart failure and stage 1 through stage 4 chronic kidney disease, or unspecified chronic kidney disease: Secondary | ICD-10-CM | POA: Diagnosis not present

## 2020-07-15 DIAGNOSIS — I6522 Occlusion and stenosis of left carotid artery: Secondary | ICD-10-CM | POA: Diagnosis not present

## 2020-07-15 DIAGNOSIS — T462X1D Poisoning by other antidysrhythmic drugs, accidental (unintentional), subsequent encounter: Secondary | ICD-10-CM | POA: Diagnosis not present

## 2020-07-15 DIAGNOSIS — I5022 Chronic systolic (congestive) heart failure: Secondary | ICD-10-CM | POA: Diagnosis not present

## 2020-07-15 DIAGNOSIS — I480 Paroxysmal atrial fibrillation: Secondary | ICD-10-CM | POA: Diagnosis not present

## 2020-07-15 DIAGNOSIS — Z79899 Other long term (current) drug therapy: Secondary | ICD-10-CM | POA: Diagnosis not present

## 2020-07-15 DIAGNOSIS — I5040 Unspecified combined systolic (congestive) and diastolic (congestive) heart failure: Secondary | ICD-10-CM | POA: Diagnosis not present

## 2020-07-15 DIAGNOSIS — D631 Anemia in chronic kidney disease: Secondary | ICD-10-CM | POA: Diagnosis not present

## 2020-07-15 LAB — CUP PACEART INCLINIC DEVICE CHECK
Brady Statistic RA Percent Paced: 22 %
Brady Statistic RV Percent Paced: 95 %
Date Time Interrogation Session: 20220119110352
Implantable Lead Implant Date: 20170106
Implantable Lead Implant Date: 20170106
Implantable Lead Implant Date: 20170106
Implantable Lead Location: 753858
Implantable Lead Location: 753859
Implantable Lead Location: 753860
Implantable Lead Model: 4677
Implantable Lead Model: 7741
Implantable Lead Model: 7742
Implantable Lead Serial Number: 507382
Implantable Lead Serial Number: 695539
Implantable Lead Serial Number: 724660
Implantable Pulse Generator Implant Date: 20170106
Lead Channel Pacing Threshold Amplitude: 0.8 V
Lead Channel Pacing Threshold Amplitude: 1 V
Lead Channel Pacing Threshold Amplitude: 1.2 V
Lead Channel Pacing Threshold Pulse Width: 0.4 ms
Lead Channel Pacing Threshold Pulse Width: 0.4 ms
Lead Channel Pacing Threshold Pulse Width: 0.4 ms
Lead Channel Sensing Intrinsic Amplitude: 16.3 mV
Lead Channel Sensing Intrinsic Amplitude: 2.8 mV
Pulse Gen Serial Number: 714587

## 2020-07-15 NOTE — Patient Instructions (Signed)
Medication Instructions:  Your physician recommends that you continue on your current medications as directed. Please refer to the Current Medication list given to you today.  *If you need a refill on your cardiac medications before your next appointment, please call your pharmacy*   Lab Work: NONE   If you have labs (blood work) drawn today and your tests are completely normal, you will receive your results only by: MyChart Message (if you have MyChart) OR A paper copy in the mail If you have any lab test that is abnormal or we need to change your treatment, we will call you to review the results.   Testing/Procedures: NONE    Follow-Up: At [facility], you and your health needs are our priority.  As part of our continuing mission to provide you with exceptional heart care, we have created designated Provider Care Teams.  These Care Teams include your primary Cardiologist (physician) and Advanced Practice Providers (APPs -  Physician Assistants and Nurse Practitioners) who all work together to provide you with the care you need, when you need it.  We recommend signing up for the patient portal called "MyChart".  Sign up information is provided on this After Visit Summary.  MyChart is used to connect with patients for Virtual Visits (Telemedicine).  Patients are able to view lab/test results, encounter notes, upcoming appointments, etc.  Non-urgent messages can be sent to your provider as well.   To learn more about what you can do with MyChart, go to [email or web address].    Your next appointment:   3-4 month(s)  The format for your next appointment:   In Person  Provider:   Gregg Taylor, MD   Other Instructions Thank you for choosing [facility]!    

## 2020-07-15 NOTE — Progress Notes (Signed)
HPI [name] returns for ongoing followup. He is a pleasant 87 year old man with a history of chronic systolic heart failure, paroxysmal atrial fibrillation who is been fairly well controlled on amiodarone therapy.  Unfortunately he developed lung toxicity within 3 months of starting the [facility] and has been treated with prednisone under the direction of Dr. Halford Chessman. He denies palpitations.    No Known Allergies   Current Outpatient Medications  Medication Sig Dispense Refill  . acetaminophen (TYLENOL) 500 MG tablet Take 500 mg by mouth every 6 (six) hours as needed.    . Amino Acids-Protein Hydrolys (FEEDING SUPPLEMENT, PRO-STAT 64,) LIQD Take 30 mLs by mouth in the morning and at bedtime.    Marland Kitchen apixaban (ELIQUIS) 2.5 MG TABS tablet Take 1 tablet (2.5 mg total) by mouth 2 (two) times daily. 60 tablet 0  . carvedilol (COREG) 6.25 MG tablet Take 1 tablet (6.25 mg total) by mouth 2 (two) times daily with a meal. 60 tablet 0  . feeding supplement (ENSURE ENLIVE / ENSURE PLUS) LIQD Take 237 mLs by mouth 2 (two) times daily between meals.    . ferrous sulfate 325 (65 FE) MG tablet Take 1 tablet (325 mg total) by mouth daily with breakfast. 30 tablet 0  . furosemide (LASIX) 20 MG tablet Take 1 tablet (20 mg total) by mouth daily. 30 tablet 0  . Ipratropium-Albuterol (COMBIVENT RESPIMAT) 20-100 MCG/ACT AERS respimat Inhale 1 puff into the lungs every 6 (six) hours as needed for wheezing or shortness of breath. 1 each 0  . methocarbamol (ROBAXIN) 500 MG tablet Take 1 tablet (500 mg total) by mouth 3 (three) times daily. 90 tablet 0  . NON FORMULARY Diet: ____ Regular, ___x___ NAS, _______Consistent Carbohydrate, _______NPO _____Other    . OXYGEN Inhale 4 L into the lungs continuous.    . polyethylene glycol (MIRALAX / GLYCOLAX) 17 g packet Take 17 g by mouth daily.  0  . pravastatin (PRAVACHOL) 20 MG tablet Take 1 tablet (20 mg total) by mouth every evening. 30 tablet 0  . predniSONE  (DELTASONE) 10 MG tablet Take 1 tablet (10 mg total) by mouth daily with breakfast. 30 tablet 0  . predniSONE (DELTASONE) 5 MG tablet Take 1 tablet by mouth daily for two weeks, then take 1 tablet every other day for two weeks, and then STOP 21 tablet 0   No current facility-administered medications for this visit.     Past Medical History:  Diagnosis Date  . Atrial fibrillation ([place])   . Bradycardia   . Cardiomyopathy 2005   a. Possibly alcoholic; b. cath in 6222-97% ostial D1, PCW of 12, EF of 35-40%;  c. EF of 0.25 in 11/2003;  d. 40-45% in 05/2004;  e. 25% in 10/2008 by echo;  f. 04/2015 Echo: EF 40-45%, diff HK, sev inflat/inf HK, Gr 1 DD, mild AI, triv TR.  . Carotid artery occlusion   . Cerebrovascular disease    Carotid ultrasound in 12/2010-60-79% left internal carotid artery, less than 40% or right RICA, no change  . Chronic kidney disease    Creatinine of 1.6 in 2009  . Hyperlipidemia 10/15/2011   Lipid profile in 03/2009:231, 136, 56, 148  . Left bundle branch block   . Prostate carcinoma ([place])   . Syncope    [place] CRT-P 07/03/15 Dr. Lovena Le    ROS:   All systems reviewed and negative except as noted in the HPI.   Past Surgical History:  Procedure Laterality Date  .  CATARACT EXTRACTION     Right  . COLONOSCOPY N/A 08/16/2012   Procedure: COLONOSCOPY;  Surgeon: Rogene Houston, MD;  Location: [facility];  Service: Endoscopy;  Laterality: N/A;  930  . COLONOSCOPY N/A 10/21/2015   Procedure: COLONOSCOPY;  Surgeon: Rogene Houston, MD;  Location: [facility];  Service: Endoscopy;  Laterality: N/A;  1200  . COLONOSCOPY W/ POLYPECTOMY  2010   Diverticulosis  . EP IMPLANTABLE DEVICE N/A 07/03/2015   Procedure: BiV Pacemaker Insertion CRT-P;  Surgeon: Evans Lance, MD;  Location: [place] LAB;  Service: Cardiovascular;  Laterality: N/A;  . EYE SURGERY Right    Cataract  . HERNIA REPAIR       Family History  Problem Relation Age of Onset  . Hypertension Mother       Social History   Socioeconomic History  . Marital status: Married    Spouse name: Not on file  . Number of children: Not on file  . Years of education: Not on file  . Highest education level: Not on file  Occupational History  . Not on file  Tobacco Use  . Smoking status: Former Smoker    Packs/day: 1.00    Years: 30.00    Pack years: 30.00    Quit date: 09/28/1973    Years since quitting: 46.8  . Smokeless tobacco: Never Used  Vaping Use  . Vaping Use: Never used  Substance and Sexual Activity  . Alcohol use: Yes    Alcohol/week: 5.0 standard drinks    Types: 5 Glasses of wine per week    Comment: History of excessive alcohol use; Alternates between glass of wine vs. Liquor drink 5 days per week (04/2015)  . Drug use: No  . Sexual activity: Not on file  Other Topics Concern  . Not on file  Social History Narrative  . Not on file   Social Determinants of Health   Financial Resource Strain: Not on file  Food Insecurity: Not on file  Transportation Needs: Not on file  Physical Activity: Not on file  Stress: Not on file  Social Connections: Not on file  Intimate Partner Violence: Not on file     BP 122/64   Pulse 100   Ht 6\' 1"  (1.854 m)   Wt 168 lb (76.2 kg)   SpO2 (!) 85%   BMI 22.16 kg/m   Physical Exam:  Well appearing NAD HEENT: Unremarkable Neck:  No JVD, no thyromegally Lymphatics:  No adenopathy Back:  No CVA tenderness Lungs:  Scattered rales throughout HEART:  Regular rate rhythm, no murmurs, no rubs, no clicks Abd:  soft, positive bowel sounds, no organomegally, no rebound, no guarding Ext:  2 plus pulses, no edema, no cyanosis, no clubbing Skin:  No rashes no nodules Neuro:  CN II through XII intact, motor grossly intact   DEVICE  Normal device function.  See PaceArt for details.   Assess/Plan: 1. PAF - he is still maintaining NSR off of amio for 2 months. When he goes back to atrial fib, we will consider dofetilide vs av node  ablation. 2. Chronic systolic heart failure -he has done well since his biv was placed. He will maintain a low sodium diet. 3. Amio lung tox - he will continue prednisone under the direction of Dr. Halford Chessman. He is off of his amio. 4. PPM -his [employer] PPM is working normally. We will recheck in several months.  Carleene Overlie Kieli Golladay,MD

## 2020-07-16 DIAGNOSIS — I480 Paroxysmal atrial fibrillation: Secondary | ICD-10-CM | POA: Diagnosis not present

## 2020-07-16 DIAGNOSIS — T462X1D Poisoning by other antidysrhythmic drugs, accidental (unintentional), subsequent encounter: Secondary | ICD-10-CM | POA: Diagnosis not present

## 2020-07-16 DIAGNOSIS — I5040 Unspecified combined systolic (congestive) and diastolic (congestive) heart failure: Secondary | ICD-10-CM | POA: Diagnosis not present

## 2020-07-16 DIAGNOSIS — N1832 Chronic kidney disease, stage 3b: Secondary | ICD-10-CM | POA: Diagnosis not present

## 2020-07-16 DIAGNOSIS — I6522 Occlusion and stenosis of left carotid artery: Secondary | ICD-10-CM | POA: Diagnosis not present

## 2020-07-16 DIAGNOSIS — I130 Hypertensive heart and chronic kidney disease with heart failure and stage 1 through stage 4 chronic kidney disease, or unspecified chronic kidney disease: Secondary | ICD-10-CM | POA: Diagnosis not present

## 2020-07-16 DIAGNOSIS — D631 Anemia in chronic kidney disease: Secondary | ICD-10-CM | POA: Diagnosis not present

## 2020-07-16 DIAGNOSIS — J9621 Acute and chronic respiratory failure with hypoxia: Secondary | ICD-10-CM | POA: Diagnosis not present

## 2020-07-16 DIAGNOSIS — E785 Hyperlipidemia, unspecified: Secondary | ICD-10-CM | POA: Diagnosis not present

## 2020-07-17 DIAGNOSIS — N1832 Chronic kidney disease, stage 3b: Secondary | ICD-10-CM | POA: Diagnosis not present

## 2020-07-17 DIAGNOSIS — D631 Anemia in chronic kidney disease: Secondary | ICD-10-CM | POA: Diagnosis not present

## 2020-07-17 DIAGNOSIS — E785 Hyperlipidemia, unspecified: Secondary | ICD-10-CM | POA: Diagnosis not present

## 2020-07-17 DIAGNOSIS — I130 Hypertensive heart and chronic kidney disease with heart failure and stage 1 through stage 4 chronic kidney disease, or unspecified chronic kidney disease: Secondary | ICD-10-CM | POA: Diagnosis not present

## 2020-07-17 DIAGNOSIS — I5040 Unspecified combined systolic (congestive) and diastolic (congestive) heart failure: Secondary | ICD-10-CM | POA: Diagnosis not present

## 2020-07-17 DIAGNOSIS — J9621 Acute and chronic respiratory failure with hypoxia: Secondary | ICD-10-CM | POA: Diagnosis not present

## 2020-07-17 DIAGNOSIS — I480 Paroxysmal atrial fibrillation: Secondary | ICD-10-CM | POA: Diagnosis not present

## 2020-07-17 DIAGNOSIS — T462X1D Poisoning by other antidysrhythmic drugs, accidental (unintentional), subsequent encounter: Secondary | ICD-10-CM | POA: Diagnosis not present

## 2020-07-17 DIAGNOSIS — I6522 Occlusion and stenosis of left carotid artery: Secondary | ICD-10-CM | POA: Diagnosis not present

## 2020-07-21 DIAGNOSIS — I5040 Unspecified combined systolic (congestive) and diastolic (congestive) heart failure: Secondary | ICD-10-CM | POA: Diagnosis not present

## 2020-07-21 DIAGNOSIS — I130 Hypertensive heart and chronic kidney disease with heart failure and stage 1 through stage 4 chronic kidney disease, or unspecified chronic kidney disease: Secondary | ICD-10-CM | POA: Diagnosis not present

## 2020-07-21 DIAGNOSIS — J9621 Acute and chronic respiratory failure with hypoxia: Secondary | ICD-10-CM | POA: Diagnosis not present

## 2020-07-21 DIAGNOSIS — I480 Paroxysmal atrial fibrillation: Secondary | ICD-10-CM | POA: Diagnosis not present

## 2020-07-21 DIAGNOSIS — D631 Anemia in chronic kidney disease: Secondary | ICD-10-CM | POA: Diagnosis not present

## 2020-07-21 DIAGNOSIS — N1832 Chronic kidney disease, stage 3b: Secondary | ICD-10-CM | POA: Diagnosis not present

## 2020-07-21 DIAGNOSIS — T462X1D Poisoning by other antidysrhythmic drugs, accidental (unintentional), subsequent encounter: Secondary | ICD-10-CM | POA: Diagnosis not present

## 2020-07-21 DIAGNOSIS — E785 Hyperlipidemia, unspecified: Secondary | ICD-10-CM | POA: Diagnosis not present

## 2020-07-21 DIAGNOSIS — I6522 Occlusion and stenosis of left carotid artery: Secondary | ICD-10-CM | POA: Diagnosis not present

## 2020-07-22 DIAGNOSIS — D72829 Elevated white blood cell count, unspecified: Secondary | ICD-10-CM | POA: Diagnosis not present

## 2020-07-22 DIAGNOSIS — J9611 Chronic respiratory failure with hypoxia: Secondary | ICD-10-CM | POA: Diagnosis not present

## 2020-07-22 DIAGNOSIS — N1831 Chronic kidney disease, stage 3a: Secondary | ICD-10-CM | POA: Diagnosis not present

## 2020-07-23 DIAGNOSIS — I6522 Occlusion and stenosis of left carotid artery: Secondary | ICD-10-CM | POA: Diagnosis not present

## 2020-07-23 DIAGNOSIS — N1832 Chronic kidney disease, stage 3b: Secondary | ICD-10-CM | POA: Diagnosis not present

## 2020-07-23 DIAGNOSIS — I130 Hypertensive heart and chronic kidney disease with heart failure and stage 1 through stage 4 chronic kidney disease, or unspecified chronic kidney disease: Secondary | ICD-10-CM | POA: Diagnosis not present

## 2020-07-23 DIAGNOSIS — I480 Paroxysmal atrial fibrillation: Secondary | ICD-10-CM | POA: Diagnosis not present

## 2020-07-23 DIAGNOSIS — J9621 Acute and chronic respiratory failure with hypoxia: Secondary | ICD-10-CM | POA: Diagnosis not present

## 2020-07-23 DIAGNOSIS — D631 Anemia in chronic kidney disease: Secondary | ICD-10-CM | POA: Diagnosis not present

## 2020-07-23 DIAGNOSIS — T462X1D Poisoning by other antidysrhythmic drugs, accidental (unintentional), subsequent encounter: Secondary | ICD-10-CM | POA: Diagnosis not present

## 2020-07-23 DIAGNOSIS — I5040 Unspecified combined systolic (congestive) and diastolic (congestive) heart failure: Secondary | ICD-10-CM | POA: Diagnosis not present

## 2020-07-23 DIAGNOSIS — E785 Hyperlipidemia, unspecified: Secondary | ICD-10-CM | POA: Diagnosis not present

## 2020-07-27 DIAGNOSIS — I480 Paroxysmal atrial fibrillation: Secondary | ICD-10-CM | POA: Diagnosis not present

## 2020-07-27 DIAGNOSIS — J9621 Acute and chronic respiratory failure with hypoxia: Secondary | ICD-10-CM | POA: Diagnosis not present

## 2020-07-27 DIAGNOSIS — E785 Hyperlipidemia, unspecified: Secondary | ICD-10-CM | POA: Diagnosis not present

## 2020-07-27 DIAGNOSIS — I130 Hypertensive heart and chronic kidney disease with heart failure and stage 1 through stage 4 chronic kidney disease, or unspecified chronic kidney disease: Secondary | ICD-10-CM | POA: Diagnosis not present

## 2020-07-27 DIAGNOSIS — I6522 Occlusion and stenosis of left carotid artery: Secondary | ICD-10-CM | POA: Diagnosis not present

## 2020-07-27 DIAGNOSIS — T462X1D Poisoning by other antidysrhythmic drugs, accidental (unintentional), subsequent encounter: Secondary | ICD-10-CM | POA: Diagnosis not present

## 2020-07-27 DIAGNOSIS — N1832 Chronic kidney disease, stage 3b: Secondary | ICD-10-CM | POA: Diagnosis not present

## 2020-07-27 DIAGNOSIS — D631 Anemia in chronic kidney disease: Secondary | ICD-10-CM | POA: Diagnosis not present

## 2020-07-27 DIAGNOSIS — I5040 Unspecified combined systolic (congestive) and diastolic (congestive) heart failure: Secondary | ICD-10-CM | POA: Diagnosis not present

## 2020-07-28 ENCOUNTER — Other Ambulatory Visit: Payer: Self-pay | Admitting: Adult Health

## 2020-07-31 DIAGNOSIS — J9621 Acute and chronic respiratory failure with hypoxia: Secondary | ICD-10-CM | POA: Diagnosis not present

## 2020-07-31 DIAGNOSIS — N1832 Chronic kidney disease, stage 3b: Secondary | ICD-10-CM | POA: Diagnosis not present

## 2020-07-31 DIAGNOSIS — I6522 Occlusion and stenosis of left carotid artery: Secondary | ICD-10-CM | POA: Diagnosis not present

## 2020-07-31 DIAGNOSIS — E785 Hyperlipidemia, unspecified: Secondary | ICD-10-CM | POA: Diagnosis not present

## 2020-07-31 DIAGNOSIS — I5040 Unspecified combined systolic (congestive) and diastolic (congestive) heart failure: Secondary | ICD-10-CM | POA: Diagnosis not present

## 2020-07-31 DIAGNOSIS — I130 Hypertensive heart and chronic kidney disease with heart failure and stage 1 through stage 4 chronic kidney disease, or unspecified chronic kidney disease: Secondary | ICD-10-CM | POA: Diagnosis not present

## 2020-07-31 DIAGNOSIS — I480 Paroxysmal atrial fibrillation: Secondary | ICD-10-CM | POA: Diagnosis not present

## 2020-07-31 DIAGNOSIS — D631 Anemia in chronic kidney disease: Secondary | ICD-10-CM | POA: Diagnosis not present

## 2020-07-31 DIAGNOSIS — T462X1D Poisoning by other antidysrhythmic drugs, accidental (unintentional), subsequent encounter: Secondary | ICD-10-CM | POA: Diagnosis not present

## 2020-08-03 ENCOUNTER — Ambulatory Visit (INDEPENDENT_AMBULATORY_CARE_PROVIDER_SITE_OTHER): Payer: Medicare HMO

## 2020-08-03 DIAGNOSIS — I428 Other cardiomyopathies: Secondary | ICD-10-CM

## 2020-08-05 LAB — CUP PACEART REMOTE DEVICE CHECK
Battery Remaining Longevity: 66 mo
Battery Remaining Percentage: 76 %
Brady Statistic RA Percent Paced: 0 %
Brady Statistic RV Percent Paced: 98 %
Date Time Interrogation Session: 20220208112400
Implantable Lead Implant Date: 20170106
Implantable Lead Implant Date: 20170106
Implantable Lead Implant Date: 20170106
Implantable Lead Location: 753858
Implantable Lead Location: 753859
Implantable Lead Location: 753860
Implantable Lead Model: 4677
Implantable Lead Model: 7741
Implantable Lead Model: 7742
Implantable Lead Serial Number: 507382
Implantable Lead Serial Number: 695539
Implantable Lead Serial Number: 724660
Implantable Pulse Generator Implant Date: 20170106
Lead Channel Impedance Value: 636 Ohm
Lead Channel Impedance Value: 652 Ohm
Lead Channel Impedance Value: 667 Ohm
Lead Channel Pacing Threshold Amplitude: 0.8 V
Lead Channel Pacing Threshold Amplitude: 1.2 V
Lead Channel Pacing Threshold Pulse Width: 0.4 ms
Lead Channel Pacing Threshold Pulse Width: 0.8 ms
Lead Channel Setting Pacing Amplitude: 2 V
Lead Channel Setting Pacing Amplitude: 2 V
Lead Channel Setting Pacing Amplitude: 2.2 V
Lead Channel Setting Pacing Pulse Width: 0.4 ms
Lead Channel Setting Pacing Pulse Width: 0.8 ms
Lead Channel Setting Sensing Sensitivity: 2.5 mV
Lead Channel Setting Sensing Sensitivity: 2.5 mV
Pulse Gen Serial Number: 714587

## 2020-08-07 NOTE — Progress Notes (Signed)
Remote pacemaker transmission.   

## 2020-08-12 DIAGNOSIS — J9611 Chronic respiratory failure with hypoxia: Secondary | ICD-10-CM | POA: Diagnosis not present

## 2020-08-12 DIAGNOSIS — J9621 Acute and chronic respiratory failure with hypoxia: Secondary | ICD-10-CM | POA: Diagnosis not present

## 2020-08-12 DIAGNOSIS — N1832 Chronic kidney disease, stage 3b: Secondary | ICD-10-CM | POA: Diagnosis not present

## 2020-08-12 DIAGNOSIS — I5042 Chronic combined systolic (congestive) and diastolic (congestive) heart failure: Secondary | ICD-10-CM | POA: Diagnosis not present

## 2020-08-17 ENCOUNTER — Other Ambulatory Visit: Payer: Self-pay | Admitting: Adult Health

## 2020-08-28 ENCOUNTER — Other Ambulatory Visit: Payer: Self-pay

## 2020-08-28 ENCOUNTER — Ambulatory Visit: Payer: Medicare Other | Admitting: Pulmonary Disease

## 2020-08-28 ENCOUNTER — Encounter: Payer: Self-pay | Admitting: Pulmonary Disease

## 2020-08-28 VITALS — BP 118/66 | HR 74 | Temp 97.9°F | Ht 72.0 in | Wt 173.0 lb

## 2020-08-28 DIAGNOSIS — J9611 Chronic respiratory failure with hypoxia: Secondary | ICD-10-CM | POA: Diagnosis not present

## 2020-08-28 DIAGNOSIS — T462X5A Adverse effect of other antidysrhythmic drugs, initial encounter: Secondary | ICD-10-CM

## 2020-08-28 DIAGNOSIS — J984 Other disorders of lung: Secondary | ICD-10-CM | POA: Diagnosis not present

## 2020-08-28 NOTE — Progress Notes (Signed)
[facility]  Chief Complaint  Patient presents with  . Follow-up    Patient states he's doing better, still has shortness of breath    Constitutional:  BP 118/66 (BP Location: Left Arm, Cuff Size: Normal)   Pulse 74   Temp 97.9 F (36.6 C) (Temporal)   Ht 6' (1.829 m)   Wt 173 lb (78.5 kg)   SpO2 91% Comment: 3L Pulse O2  BMI 23.46 kg/m   Past Medical History:  Systolic CHF status postBiV PPM, CKD 3a, Carotid stenosis, HLD,PAF, HTN  Past Surgical History:  He  has a past surgical history that includes Cataract extraction; Colonoscopy w/ polypectomy (2010); Colonoscopy (N/A, 08/16/2012); Eye surgery (Right); Cardiac catheterization (N/A, 07/03/2015); Hernia repair; and Colonoscopy (N/A, 10/21/2015).  Brief Summary:  Max Cole is a 87 y.o. 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      Subjective:   He is here with his daughter.  He has been frustrated by the rate of his improvement, but he has been making improvement.  He is not able to walk at a slow pace for about 20 minutes at a stretch.  He has to use 5 liters continuous flow oxygen when exercising.  Otherwise uses 3 liters pulsed.  He gets intermittent back pain, but not having chest pain.  Not having cough, wheeze, sputum, or leg swelling.  Has been off prednisone for about 6 weeks.  Physical Exam:   Appearance - well kempt, wearing oxygen  ENMT - no sinus tenderness, no oral exudate, no LAN, Mallampati 2 airway, no stridor  Respiratory - coarse breath sounds bilaterally, no wheezing or rales  CV - s1s2 regular rate and rhythm, no murmurs  Ext - no clubbing, no edema  Skin - no rashes  Psych - normal mood and affect   Pulmonary testing:   ANA 05/24/20 >> negative  A1AT 06/01/20 >> 159  Chest Imaging:   CT angio chest 05/22/20 >> widespread, symmetric, peripheral GGO  Cardiac Tests:   Echo 02/07/20 >> EF 50  to 55%, grade 1 DD, mild elevation in PASP, severe LA dilation, mod RA dilation  Social History:  He  reports that he quit smoking about 46 years ago. He has a 30.00 pack-year smoking history. He has never used smokeless tobacco. He reports current alcohol use of about 5.0 standard drinks of alcohol per week. He reports that he does not use drugs.  Family History:  His family history includes Hypertension in his mother.     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 - has been off prednisone since January 2022 - clinically improving, and O2 needs improved compared to when he was in hospital in December 2021 - uses 3 liters pulsed O2 at rest and light activity, but 5 liters continuous flow when doing more prolonged activities - goal SpO2 88 to 95% - discussed breathing exercises he can try to improve respiratory muscle strength - defer additional imaging studies for now  Atrial fibrillation, chronic systolic CHF. - followed by Dr. Cristopher Peru with [facility]  Goals of care. - DNR/DNI  Time Spent Involved in Patient Care on Day of Examination:  35 minutes  Follow up:  Patient Instructions  Follow up in 3 months   Medication List:   Allergies as of 08/28/2020   No Known Allergies     Medication List       Accurate as  of August 28, 2020 12:47 PM. If you have any questions, ask your nurse or doctor.        STOP taking these medications   feeding supplement (PRO-STAT 64) Liqd Stopped by: Chesley Mires, MD   predniSONE 10 MG tablet Commonly known as: DELTASONE Stopped by: Chesley Mires, MD   predniSONE 5 MG tablet Commonly known as: DELTASONE Stopped by: Chesley Mires, MD     TAKE these medications   acetaminophen 500 MG tablet Commonly known as: TYLENOL Take 500 mg by mouth every 6 (six) hours as needed.   apixaban 2.5 MG Tabs tablet Commonly known as: ELIQUIS Take 1 tablet (2.5 mg total) by mouth 2 (two) times  daily.   carvedilol 6.25 MG tablet Commonly known as: COREG Take 1 tablet (6.25 mg total) by mouth 2 (two) times daily with a meal.   Combivent Respimat 20-100 MCG/ACT Aers respimat Generic drug: Ipratropium-Albuterol Inhale 1 puff into the lungs every 6 (six) hours as needed for wheezing or shortness of breath.   feeding supplement Liqd Take 237 mLs by mouth 2 (two) times daily between meals.   ferrous sulfate 325 (65 FE) MG tablet Take 1 tablet (325 mg total) by mouth daily with breakfast.   furosemide 20 MG tablet Commonly known as: LASIX Take 1 tablet (20 mg total) by mouth daily.   methocarbamol 500 MG tablet Commonly known as: ROBAXIN Take 1 tablet (500 mg total) by mouth 3 (three) times daily.   NON FORMULARY Diet: ____ Regular, ___x___ NAS, _______Consistent Carbohydrate, _______NPO _____Other   OXYGEN Inhale 4 L into the lungs continuous.   polyethylene glycol 17 g packet Commonly known as: MIRALAX / GLYCOLAX Take 17 g by mouth daily.   pravastatin 20 MG tablet Commonly known as: PRAVACHOL Take 1 tablet (20 mg total) by mouth every evening.       Signature:  Chesley Mires, MD [place] Pager - [phone] 08/28/2020, 12:47 PM

## 2020-08-28 NOTE — Patient Instructions (Signed)
Follow up in 3 months

## 2020-09-09 DIAGNOSIS — J9611 Chronic respiratory failure with hypoxia: Secondary | ICD-10-CM | POA: Diagnosis not present

## 2020-09-09 DIAGNOSIS — J9621 Acute and chronic respiratory failure with hypoxia: Secondary | ICD-10-CM | POA: Diagnosis not present

## 2020-09-09 DIAGNOSIS — N1832 Chronic kidney disease, stage 3b: Secondary | ICD-10-CM | POA: Diagnosis not present

## 2020-09-09 DIAGNOSIS — I5042 Chronic combined systolic (congestive) and diastolic (congestive) heart failure: Secondary | ICD-10-CM | POA: Diagnosis not present

## 2020-09-21 ENCOUNTER — Encounter (HOSPITAL_COMMUNITY): Payer: Self-pay | Admitting: Physical Therapy

## 2020-09-21 NOTE — Therapy (Signed)
[place], [place], [address] Phone: [phone]   Fax:  [phone]  Patient Details  Name: Max Cole MRN: [id number] Date of Birth: 09-18-1927 Referring Provider:  No ref. provider found  Encounter Date: 09/21/2020  PHYSICAL THERAPY DISCHARGE SUMMARY  Visits from Start of Care: 2  Current functional level related to goals / functional outcomes: Unknown as patient has not returned.   Remaining deficits: Unknown as patient has not returned.   Education / Equipment: HEP  Plan: Patient agrees to discharge.  Patient goals were not met. Patient is being discharged due to a change in medical status.  ?????     11:17 AM, 09/21/20 Mearl Latin PT, DPT Physical Therapist at [place], [place], [address] Phone: [phone]   Fax:  [phone]

## 2020-10-01 ENCOUNTER — Other Ambulatory Visit (HOSPITAL_COMMUNITY)
Admission: RE | Admit: 2020-10-01 | Discharge: 2020-10-01 | Disposition: A | Discharge status: Home / Self Care | Payer: Medicare HMO | Source: Ambulatory Visit | Attending: Internal Medicine | Admitting: Internal Medicine

## 2020-10-01 DIAGNOSIS — I779 Disorder of arteries and arterioles, unspecified: Secondary | ICD-10-CM | POA: Insufficient documentation

## 2020-10-01 DIAGNOSIS — I426 Alcoholic cardiomyopathy: Secondary | ICD-10-CM | POA: Diagnosis not present

## 2020-10-01 DIAGNOSIS — E785 Hyperlipidemia, unspecified: Secondary | ICD-10-CM | POA: Insufficient documentation

## 2020-10-01 DIAGNOSIS — R42 Dizziness and giddiness: Secondary | ICD-10-CM | POA: Diagnosis not present

## 2020-10-01 DIAGNOSIS — N189 Chronic kidney disease, unspecified: Secondary | ICD-10-CM | POA: Insufficient documentation

## 2020-10-01 DIAGNOSIS — D6489 Other specified anemias: Secondary | ICD-10-CM | POA: Diagnosis not present

## 2020-10-01 DIAGNOSIS — R001 Bradycardia, unspecified: Secondary | ICD-10-CM | POA: Insufficient documentation

## 2020-10-01 DIAGNOSIS — J449 Chronic obstructive pulmonary disease, unspecified: Secondary | ICD-10-CM | POA: Diagnosis not present

## 2020-10-01 DIAGNOSIS — I447 Left bundle-branch block, unspecified: Secondary | ICD-10-CM | POA: Diagnosis not present

## 2020-10-01 LAB — CBC
HCT: 35.5 % — ABNORMAL LOW (ref 39.0–52.0)
Hemoglobin: 11.2 g/dL — ABNORMAL LOW (ref 13.0–17.0)
MCH: 30.5 pg (ref 26.0–34.0)
MCHC: 31.5 g/dL (ref 30.0–36.0)
MCV: 96.7 fL (ref 80.0–100.0)
Platelets: 308 10*3/uL (ref 150–400)
RBC: 3.67 MIL/uL — ABNORMAL LOW (ref 4.22–5.81)
RDW: 12.9 % (ref 11.5–15.5)
WBC: 9.1 10*3/uL (ref 4.0–10.5)
nRBC: 0 % (ref 0.0–0.2)

## 2020-10-01 LAB — COMPREHENSIVE METABOLIC PANEL
ALT: 15 U/L (ref 0–44)
AST: 20 U/L (ref 15–41)
Albumin: 3.5 g/dL (ref 3.5–5.0)
Alkaline Phosphatase: 60 U/L (ref 38–126)
Anion gap: 10 (ref 5–15)
BUN: 31 mg/dL — ABNORMAL HIGH (ref 8–23)
CO2: 28 mmol/L (ref 22–32)
Calcium: 9.3 mg/dL (ref 8.9–10.3)
Chloride: 100 mmol/L (ref 98–111)
Creatinine, Ser: 1.63 mg/dL — ABNORMAL HIGH (ref 0.61–1.24)
GFR, Estimated: 40 mL/min — ABNORMAL LOW (ref >60)
Glucose, Bld: 100 mg/dL — ABNORMAL HIGH (ref 70–99)
Potassium: 4.3 mmol/L (ref 3.5–5.1)
Sodium: 138 mmol/L (ref 135–145)
Total Bilirubin: 0.5 mg/dL (ref 0.3–1.2)
Total Protein: 6.8 g/dL (ref 6.5–8.1)

## 2020-10-08 DIAGNOSIS — I5022 Chronic systolic (congestive) heart failure: Secondary | ICD-10-CM | POA: Diagnosis not present

## 2020-10-08 DIAGNOSIS — J9611 Chronic respiratory failure with hypoxia: Secondary | ICD-10-CM | POA: Diagnosis not present

## 2020-10-08 DIAGNOSIS — N1831 Chronic kidney disease, stage 3a: Secondary | ICD-10-CM | POA: Diagnosis not present

## 2020-10-10 DIAGNOSIS — J9621 Acute and chronic respiratory failure with hypoxia: Secondary | ICD-10-CM | POA: Diagnosis not present

## 2020-10-10 DIAGNOSIS — I5042 Chronic combined systolic (congestive) and diastolic (congestive) heart failure: Secondary | ICD-10-CM | POA: Diagnosis not present

## 2020-10-10 DIAGNOSIS — J9611 Chronic respiratory failure with hypoxia: Secondary | ICD-10-CM | POA: Diagnosis not present

## 2020-10-10 DIAGNOSIS — N1832 Chronic kidney disease, stage 3b: Secondary | ICD-10-CM | POA: Diagnosis not present

## 2020-10-13 ENCOUNTER — Encounter: Payer: Self-pay | Admitting: Internal Medicine

## 2020-10-13 ENCOUNTER — Ambulatory Visit: Payer: Medicare HMO | Admitting: Internal Medicine

## 2020-10-13 ENCOUNTER — Other Ambulatory Visit: Payer: Self-pay

## 2020-10-13 VITALS — BP 132/70 | HR 85 | Ht 72.0 in | Wt 175.0 lb

## 2020-10-13 DIAGNOSIS — I5022 Chronic systolic (congestive) heart failure: Secondary | ICD-10-CM

## 2020-10-13 NOTE — Progress Notes (Signed)
HPI Max Cole returns for ongoing followup. He is a pleasant87 year old man with a history of chronic systolic heart failure, paroxysmal atrial fibrillation who is been fairly well controlled on amiodarone therapy.  Unfortunately he developed lung toxicity within 3 months of starting the [facility] and has been treated with prednisone under the direction of Dr. [name]. He denies palpitations. He has not had recurrent palps and he has started back exercising. He checks his pulse ox and he will drop into the mid 80's on the sat.   No Known Allergies   Current Outpatient Medications  Medication Sig Dispense Refill  . acetaminophen (TYLENOL) 500 MG tablet Take 500 mg by mouth every 6 (six) hours as needed.    Marland Kitchen apixaban (ELIQUIS) 2.5 MG TABS tablet Take 1 tablet (2.5 mg total) by mouth 2 (two) times daily. 60 tablet 0  . carvedilol (COREG) 6.25 MG tablet Take 1 tablet (6.25 mg total) by mouth 2 (two) times daily with a meal. 60 tablet 0  . feeding supplement (ENSURE ENLIVE / ENSURE PLUS) LIQD Take 237 mLs by mouth 2 (two) times daily between meals.    . ferrous sulfate 325 (65 FE) MG tablet Take 1 tablet (325 mg total) by mouth daily with breakfast. 30 tablet 0  . furosemide (LASIX) 20 MG tablet Take 1 tablet (20 mg total) by mouth daily. 30 tablet 0  . Ipratropium-Albuterol (COMBIVENT RESPIMAT) 20-100 MCG/ACT AERS respimat Inhale 1 puff into the lungs every 6 (six) hours as needed for wheezing or shortness of breath. 1 each 0  . methocarbamol (ROBAXIN) 500 MG tablet Take 1 tablet (500 mg total) by mouth 3 (three) times daily. 90 tablet 0  . NON FORMULARY Diet: ____ Regular, ___x___ NAS, _______Consistent Carbohydrate, _______NPO _____Other    . OXYGEN Inhale 4 L into the lungs continuous.    . polyethylene glycol (MIRALAX / GLYCOLAX) 17 g packet Take 17 g by mouth daily.  0  . pravastatin (PRAVACHOL) 20 MG tablet Take 1 tablet (20 mg total) by mouth every evening. 30 tablet 0   No  current facility-administered medications for this visit.     Past Medical History:  Diagnosis Date  . Atrial fibrillation ([place])   . Bradycardia   . Cardiomyopathy 2005   a. Possibly alcoholic; b. cath in 5093-26% ostial D1, PCW of 12, EF of 35-40%;  c. EF of 0.25 in 11/2003;  d. 40-45% in 05/2004;  e. 25% in 10/2008 by echo;  f. 04/2015 Echo: EF 40-45%, diff HK, sev inflat/inf HK, Gr 1 DD, mild AI, triv TR.  . Carotid artery occlusion   . Cerebrovascular disease    Carotid ultrasound in 12/2010-60-79% left internal carotid artery, less than 40% or right RICA, no change  . Chronic kidney disease    Creatinine of 1.6 in 2009  . Hyperlipidemia 10/15/2011   Lipid profile in 03/2009:231, 136, 56, 148  . Left bundle branch block   . Prostate carcinoma ([place])   . Syncope    Max CRT-P 07/03/15 Dr. Lovena Cole    ROS:   All systems reviewed and negative except as noted in the HPI.   Past Surgical History:  Procedure Laterality Date  . CATARACT EXTRACTION     Right  . COLONOSCOPY N/A 08/16/2012   Procedure: COLONOSCOPY;  Surgeon: Max Houston, MD;  [facility];  Service: Endoscopy;  Laterality: N/A;  930  . COLONOSCOPY N/A 10/21/2015   Procedure: COLONOSCOPY;  Surgeon: Max Cole  Max Golden, MD;  Location: [facility];  Service: Endoscopy;  Laterality: N/A;  1200  . COLONOSCOPY W/ POLYPECTOMY  2010   Diverticulosis  . EP IMPLANTABLE DEVICE N/A 07/03/2015   Procedure: BiV Pacemaker Insertion CRT-P;  Surgeon: Max Lance, MD;  Location: [facility];  Service: Cardiovascular;  Laterality: N/A;  . EYE SURGERY Right    Cataract  . HERNIA REPAIR       Family History  Problem Relation Age of Onset  . Hypertension Mother      Social History   Socioeconomic History  . Marital status: Married    Spouse name: Not on file  . Number of children: Not on file  . Years of education: Not on file  . Highest education level: Not on file  Occupational History  . Not on file   Tobacco Use  . Smoking status: Former Smoker    Packs/day: 1.00    Years: 30.00    Pack years: 30.00    Quit date: 09/28/1973    Years since quitting: 47.0  . Smokeless tobacco: Never Used  Vaping Use  . Vaping Use: Never used  Substance and Sexual Activity  . Alcohol use: Yes    Alcohol/week: 5.0 standard drinks    Types: 5 Glasses of wine per week    Comment: History of excessive alcohol use; Alternates between glass of wine vs. Liquor drink 5 days per week (04/2015)  . Drug use: No  . Sexual activity: Not on file  Other Topics Concern  . Not on file  Social History Narrative  . Not on file   Social Determinants of Health   Financial Resource Strain: Not on file  Food Insecurity: Not on file  Transportation Needs: Not on file  Physical Activity: Not on file  Stress: Not on file  Social Connections: Not on file  Intimate Partner Violence: Not on file     BP 132/70   Pulse 85   Ht 6' (1.829 m)   Wt 175 lb (79.4 kg)   SpO2 99%   BMI 23.73 kg/m   Physical Exam:  Well appearing 87 yo man, NAD HEENT: Unremarkable Neck:  6 cm JVD, no thyromegally Lymphatics:  No adenopathy Back:  No CVA tenderness Lungs:  Clear with no wheezes HEART:  Regular rate rhythm, no murmurs, no rubs, no clicks Abd:  soft, positive bowel sounds, no organomegally, no rebound, no guarding Ext:  2 plus pulses, no edema, no cyanosis, no clubbing Skin:  No rashes no nodules Neuro:  CN II through XII intact, motor grossly intact   DEVICE  Normal device function.  See PaceArt for details.   Assess/Plan: 1. Atrial fib -he is maintaining NSR. He will undergo watchful waiting and when he goes back to atrial fib we will make a plan.  2. Chronic systolic heart failure - he has class 2 symptoms.  3. amio lung tox - he is doing well. No chest pain. Dyspnea is improved. 4. PPM - his Max Cole biv ppm is working normally.  Max Overlie Jeron Grahn,MD

## 2020-10-13 NOTE — Patient Instructions (Addendum)
Medication Instructions:   Your physician recommends that you continue on your current medications as directed. Please refer to the Current Medication list given to you today.  *If you need a refill on your cardiac medications before your next appointment, please call your pharmacy*   Lab Work:  None today  If you have labs (blood work) drawn today and your tests are completely normal, you will receive your results only by: Marland Kitchen MyChart Message (if you have MyChart) OR . A paper copy in the mail If you have any lab test that is abnormal or we need to change your treatment, we will call you to review the results.   Testing/Procedures:  None today   Follow-Up: At [facility], you and your health needs are our priority.  As part of our continuing mission to provide you with exceptional heart care, we have created designated Provider Care Teams.  These Care Teams include your primary Cardiologist (physician) and Advanced Practice Providers (APPs -  Physician Assistants and Nurse Practitioners) who all work together to provide you with the care you need, when you need it.  We recommend signing up for the patient portal called "MyChart".  Sign up information is provided on this After Visit Summary.  MyChart is used to connect with patients for Virtual Visits (Telemedicine).  Patients are able to view lab/test results, encounter notes, upcoming appointments, etc.  Non-urgent messages can be sent to your provider as well.   To learn more about what you can do with MyChart, go to [email or web address].    Your next appointment:   6 month(s)  The format for your next appointment:   In Person  Provider:   Cristopher Peru, MD   Other Instructions None

## 2020-11-02 ENCOUNTER — Ambulatory Visit (INDEPENDENT_AMBULATORY_CARE_PROVIDER_SITE_OTHER): Payer: Medicare HMO

## 2020-11-02 DIAGNOSIS — I428 Other cardiomyopathies: Secondary | ICD-10-CM | POA: Diagnosis not present

## 2020-11-05 LAB — CUP PACEART REMOTE DEVICE CHECK
Battery Remaining Longevity: 72 mo
Battery Remaining Percentage: 77 %
Brady Statistic RA Percent Paced: 1 %
Brady Statistic RV Percent Paced: 98 %
Date Time Interrogation Session: 20220511163000
Implantable Lead Implant Date: 20170106
Implantable Lead Implant Date: 20170106
Implantable Lead Implant Date: 20170106
Implantable Lead Location: 753858
Implantable Lead Location: 753859
Implantable Lead Location: 753860
Implantable Lead Model: 4677
Implantable Lead Model: 7741
Implantable Lead Model: 7742
Implantable Lead Serial Number: 507382
Implantable Lead Serial Number: 695539
Implantable Lead Serial Number: 724660
Implantable Pulse Generator Implant Date: 20170106
Lead Channel Impedance Value: 623 Ohm
Lead Channel Impedance Value: 625 Ohm
Lead Channel Impedance Value: 671 Ohm
Lead Channel Pacing Threshold Amplitude: 0.8 V
Lead Channel Pacing Threshold Amplitude: 1.2 V
Lead Channel Pacing Threshold Pulse Width: 0.4 ms
Lead Channel Pacing Threshold Pulse Width: 0.8 ms
Lead Channel Setting Pacing Amplitude: 2 V
Lead Channel Setting Pacing Amplitude: 2 V
Lead Channel Setting Pacing Amplitude: 2.2 V
Lead Channel Setting Pacing Pulse Width: 0.4 ms
Lead Channel Setting Pacing Pulse Width: 0.8 ms
Lead Channel Setting Sensing Sensitivity: 2.5 mV
Lead Channel Setting Sensing Sensitivity: 2.5 mV
Pulse Gen Serial Number: 714587

## 2020-11-09 DIAGNOSIS — J9611 Chronic respiratory failure with hypoxia: Secondary | ICD-10-CM | POA: Diagnosis not present

## 2020-11-09 DIAGNOSIS — I5042 Chronic combined systolic (congestive) and diastolic (congestive) heart failure: Secondary | ICD-10-CM | POA: Diagnosis not present

## 2020-11-09 DIAGNOSIS — N1832 Chronic kidney disease, stage 3b: Secondary | ICD-10-CM | POA: Diagnosis not present

## 2020-11-09 DIAGNOSIS — J9621 Acute and chronic respiratory failure with hypoxia: Secondary | ICD-10-CM | POA: Diagnosis not present

## 2020-11-11 ENCOUNTER — Telehealth: Payer: Self-pay | Admitting: Internal Medicine

## 2020-11-11 NOTE — Telephone Encounter (Signed)
Pt reports the button on his remote monitor is blinking.  Advised him to push the button to send a transmission, if any error messages received he can call tech support for assistance.

## 2020-11-11 NOTE — Telephone Encounter (Signed)
Pt has a question concerning his home remote device   Please call [phone]

## 2020-11-12 ENCOUNTER — Telehealth: Payer: Self-pay | Admitting: Internal Medicine

## 2020-11-12 ENCOUNTER — Telehealth: Payer: Self-pay

## 2020-11-12 NOTE — Telephone Encounter (Signed)
Opening error 

## 2020-11-12 NOTE — Telephone Encounter (Signed)
I spoke with daughter, Santiago Glad. She reports after speaking with her mother that patient perhaps dropped to his knee after using inhaler. No syncope reported.Daughter asked father to not use inhaler again. He was given some pain medication by pcp and has an appointment with a chiropractor tomorrow as his back is his greatest complaint. They will continue to monitor.

## 2020-11-12 NOTE — Telephone Encounter (Signed)
Pt left a message without his name but had his phone number. I called his daughter and gave her the number to [employer] tech support.

## 2020-11-12 NOTE — Telephone Encounter (Signed)
New message   Patient almost fainted after using inhaler , he used it first thing this morning and it made him light headed , he fell down to his knee and almost blacked out. He hurt his knee in the process .

## 2020-11-13 DIAGNOSIS — M62830 Muscle spasm of back: Secondary | ICD-10-CM | POA: Diagnosis not present

## 2020-11-13 DIAGNOSIS — M9902 Segmental and somatic dysfunction of thoracic region: Secondary | ICD-10-CM | POA: Diagnosis not present

## 2020-11-13 DIAGNOSIS — M9905 Segmental and somatic dysfunction of pelvic region: Secondary | ICD-10-CM | POA: Diagnosis not present

## 2020-11-13 DIAGNOSIS — M9903 Segmental and somatic dysfunction of lumbar region: Secondary | ICD-10-CM | POA: Diagnosis not present

## 2020-11-20 NOTE — Progress Notes (Signed)
Remote pacemaker transmission.   

## 2020-11-25 DIAGNOSIS — M9905 Segmental and somatic dysfunction of pelvic region: Secondary | ICD-10-CM | POA: Diagnosis not present

## 2020-11-25 DIAGNOSIS — M9902 Segmental and somatic dysfunction of thoracic region: Secondary | ICD-10-CM | POA: Diagnosis not present

## 2020-11-25 DIAGNOSIS — M62830 Muscle spasm of back: Secondary | ICD-10-CM | POA: Diagnosis not present

## 2020-11-25 DIAGNOSIS — M9903 Segmental and somatic dysfunction of lumbar region: Secondary | ICD-10-CM | POA: Diagnosis not present

## 2020-11-26 ENCOUNTER — Other Ambulatory Visit: Payer: Self-pay

## 2020-11-26 ENCOUNTER — Ambulatory Visit (HOSPITAL_COMMUNITY)
Admission: RE | Admit: 2020-11-26 | Discharge: 2020-11-26 | Disposition: A | Discharge status: Home / Self Care | Payer: Medicare HMO | Source: Ambulatory Visit | Attending: Internal Medicine | Admitting: Internal Medicine

## 2020-11-26 ENCOUNTER — Other Ambulatory Visit (HOSPITAL_COMMUNITY): Payer: Self-pay | Admitting: Internal Medicine

## 2020-11-26 DIAGNOSIS — M545 Low back pain, unspecified: Secondary | ICD-10-CM

## 2020-11-27 ENCOUNTER — Other Ambulatory Visit (HOSPITAL_COMMUNITY): Payer: Self-pay | Admitting: Internal Medicine

## 2020-11-27 DIAGNOSIS — S32019A Unspecified fracture of first lumbar vertebra, initial encounter for closed fracture: Secondary | ICD-10-CM

## 2020-12-03 ENCOUNTER — Ambulatory Visit (INDEPENDENT_AMBULATORY_CARE_PROVIDER_SITE_OTHER): Payer: Medicare HMO | Admitting: Orthopedic Surgery

## 2020-12-03 ENCOUNTER — Encounter: Payer: Self-pay | Admitting: Pulmonary Disease

## 2020-12-03 ENCOUNTER — Ambulatory Visit: Payer: Medicare HMO | Admitting: Pulmonary Disease

## 2020-12-03 ENCOUNTER — Encounter: Payer: Self-pay | Admitting: Orthopedic Surgery

## 2020-12-03 ENCOUNTER — Other Ambulatory Visit: Payer: Self-pay

## 2020-12-03 VITALS — BP 148/84 | HR 78 | Temp 97.7°F | Ht 72.0 in | Wt 171.0 lb

## 2020-12-03 VITALS — BP 129/106 | HR 78 | Ht 72.0 in | Wt 175.0 lb

## 2020-12-03 DIAGNOSIS — J984 Other disorders of lung: Secondary | ICD-10-CM | POA: Diagnosis not present

## 2020-12-03 DIAGNOSIS — M545 Low back pain, unspecified: Secondary | ICD-10-CM

## 2020-12-03 DIAGNOSIS — J9611 Chronic respiratory failure with hypoxia: Secondary | ICD-10-CM | POA: Diagnosis not present

## 2020-12-03 DIAGNOSIS — M5137 Other intervertebral disc degeneration, lumbosacral region: Secondary | ICD-10-CM | POA: Diagnosis not present

## 2020-12-03 DIAGNOSIS — T462X5A Adverse effect of other antidysrhythmic drugs, initial encounter: Secondary | ICD-10-CM | POA: Diagnosis not present

## 2020-12-03 DIAGNOSIS — D649 Anemia, unspecified: Secondary | ICD-10-CM | POA: Insufficient documentation

## 2020-12-03 DIAGNOSIS — Z91199 Patient's noncompliance with other medical treatment and regimen due to unspecified reason: Secondary | ICD-10-CM | POA: Insufficient documentation

## 2020-12-03 NOTE — Patient Instructions (Signed)
Follow up in 6 months 

## 2020-12-03 NOTE — Patient Instructions (Signed)
Dr Lovena Le   THE COMPRESSION FRACTURE IS STABLE RELATIVELY ASYMPTOMATIC AND SHOULD NOT INTERFERE WITH TREATMENT FOR DDD L SPINE

## 2020-12-03 NOTE — Progress Notes (Signed)
NEW PROBLEM   Chief Complaint  Patient presents with   Back Pain    Since May 19th 2022    Max Cole has a long history of back pain he is seeing chiropractor and ED as well as Max Cole and has had a Dosepak or 2 for lower back pain  However he had an incident where he had a flexion mechanism and started having increased back pain and had an x-ray which showed a compression fracture at L1 which was new from the x-rays I took here back in November 2021  However we have discovered that his pain is more in his lower back is not having any discomfort at the fracture site so we presume that this is an old fracture   Past Medical History:  Diagnosis Date   Atrial fibrillation ([place])    Bradycardia    Cardiomyopathy 2005   a. Possibly alcoholic; b. cath in 8101-75% ostial D1, PCW of 12, EF of 35-40%;  c. EF of 0.25 in 11/2003;  d. 40-45% in 05/2004;  e. 25% in 10/2008 by echo;  f. 04/2015 Echo: EF 40-45%, diff HK, sev inflat/inf HK, Gr 1 DD, mild AI, triv TR.   Carotid artery occlusion    Cerebrovascular disease    Carotid ultrasound in 12/2010-60-79% left internal carotid artery, less than 40% or right RICA, no change   Chronic kidney disease    Creatinine of 1.6 in 2009   Hyperlipidemia 10/15/2011   Lipid profile in 03/2009:231, 136, 56, 148   Left bundle branch block    Prostate carcinoma ([place])    Syncope    [place] CRT-P 07/03/15 Dr. Lovena Le   Is not having any red flags so to speak  Focused exam he is on oxygen he is awake and alert he is oriented his mood is pleasant his affect is normal he is ambulatory without assistive devices  His lower back is tender his L1-L2 and T11 and 12 areas are nontender  His lower extremities show normal strength range of motion with no long track signs  The x-ray which was done at the hospital shows an L1 compression fracture is new compared to the x-ray done on April 27, 2020 I have looked at both x-rays and my reading is that the new film shows a  new L1 compression fracture which is stable there is degenerative disc disease below that  Plan.DX  Encounter Diagnoses  Name Primary?   DDD (degenerative disc disease), lumbosacral Yes   Midline low back pain without sciatica, unspecified chronicity      Recommend he continue with chiropractic treatment as needed no need to treat this L1 abnormality seen on x-ray as it does not appear to be new.

## 2020-12-03 NOTE — Progress Notes (Signed)
[facility]  Chief Complaint  Patient presents with   Follow-up    No respiratory complaints currently    Constitutional:  BP (!) 148/84 (BP Location: Left Arm, Cuff Size: Normal)   Pulse 78   Temp 97.7 F (36.5 C) (Temporal)   Ht 6' (1.829 m)   Wt 171 lb (77.6 kg)   SpO2 98% Comment: 4 Liters Pulse O2  BMI 23.19 kg/m   Past Medical History:  Systolic CHF status post BiV PPM, CKD 3a, Carotid stenosis, HLD, PAF, HTN  Past Surgical History:  He  has a past surgical history that includes Cataract extraction; Colonoscopy w/ polypectomy (2010); Colonoscopy (N/A, 08/16/2012); Eye surgery (Right); Cardiac catheterization (N/A, 07/03/2015); Hernia repair; and Colonoscopy (N/A, 10/21/2015).  Brief Summary:  Max Cole is a 87 y.o. 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      Subjective:   He was doing better with activity until he hurt his back.  Working with sports medicine.  Was walking 1.5 miles at 2.2 mph on treadmill before.  Uses 2 liters O2 on concentrator at home.  Has to use more oxygen when on pulsed POC.  Not having cough, wheeze, sputum, or chest congestion.  Physical Exam:   Appearance - well kempt, wearing oxygen  ENMT - no sinus tenderness, no oral exudate, no LAN, Mallampati 2 airway, no stridor  Respiratory - equal breath sounds bilaterally, no wheezing or rales  CV - s1s2 regular rate and rhythm, no murmurs  Ext - no clubbing, no edema  Skin - no rashes  Psych - normal mood and affect    Pulmonary testing:  ANA 05/24/20 >> negative A1AT 06/01/20 >> 159  Chest Imaging:  CT angio chest 05/22/20 >> widespread, symmetric, peripheral GGO  Cardiac Tests:  Echo 02/07/20 >> EF 50 to 55%, grade 1 DD, mild elevation in PASP, severe LA dilation, mod RA dilation  Social History:  He  reports that he quit smoking about 47 years ago. He has a 30.00 pack-year smoking  history. He has never used smokeless tobacco. He reports current alcohol use of about 5.0 standard drinks of alcohol per week. He reports that he does not use drugs.  Family History:  His family history includes Hypertension in his mother.     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 - has been off prednisone since January 2022 - continues to improve clinically - 2 liters oxygen; uses high flow rate when on pulsed POC - goal SpO2 > 90%  Atrial fibrillation, chronic systolic CHF. - followed by Dr. Cristopher Peru with [facility]  Goals of care. - DNR/DNI  Time Spent Involved in Patient Care on Day of Examination:  16 minutes  Follow up:   Patient Instructions  Follow up in 6 months  Medication List:   Allergies as of 12/03/2020   No Known Allergies      Medication List        Accurate as of December 03, 2020 12:43 PM. If you have any questions, ask your nurse or doctor.          acetaminophen 500 MG tablet Commonly known as: TYLENOL Take 500 mg by mouth every 6 (six) hours as needed.   apixaban 2.5 MG Tabs tablet Commonly known as: ELIQUIS Take 1 tablet (2.5 mg total) by mouth 2 (two) times daily.   carvedilol 6.25 MG tablet Commonly known as: COREG  Take 1 tablet (6.25 mg total) by mouth 2 (two) times daily with a meal.   Combivent Respimat 20-100 MCG/ACT Aers respimat Generic drug: Ipratropium-Albuterol Inhale 1 puff into the lungs every 6 (six) hours as needed for wheezing or shortness of breath.   feeding supplement Liqd Take 237 mLs by mouth 2 (two) times daily between meals.   ferrous sulfate 325 (65 FE) MG tablet Take 1 tablet (325 mg total) by mouth daily with breakfast.   furosemide 20 MG tablet Commonly known as: LASIX Take 1 tablet (20 mg total) by mouth daily.   HYDROcodone-acetaminophen 5-325 MG tablet Commonly known as: NORCO/VICODIN Take 1 tablet by mouth in the morning and at bedtime.    methocarbamol 500 MG tablet Commonly known as: ROBAXIN Take 1 tablet (500 mg total) by mouth 3 (three) times daily.   NON FORMULARY Diet: ____ Regular, ___x___ NAS, _______Consistent Carbohydrate, _______NPO _____Other   OXYGEN Inhale 4 L into the lungs continuous.   polyethylene glycol 17 g packet Commonly known as: MIRALAX / GLYCOLAX Take 17 g by mouth daily.   pravastatin 20 MG tablet Commonly known as: PRAVACHOL Take 1 tablet (20 mg total) by mouth every evening.   traZODone 50 MG tablet Commonly known as: DESYREL Take 50 mg by mouth at bedtime.        Signature:  Chesley Mires, MD [place] Pager - [phone] 12/03/2020, 12:43 PM

## 2020-12-04 DIAGNOSIS — M9903 Segmental and somatic dysfunction of lumbar region: Secondary | ICD-10-CM | POA: Diagnosis not present

## 2020-12-04 DIAGNOSIS — M9902 Segmental and somatic dysfunction of thoracic region: Secondary | ICD-10-CM | POA: Diagnosis not present

## 2020-12-04 DIAGNOSIS — M9905 Segmental and somatic dysfunction of pelvic region: Secondary | ICD-10-CM | POA: Diagnosis not present

## 2020-12-04 DIAGNOSIS — M62830 Muscle spasm of back: Secondary | ICD-10-CM | POA: Diagnosis not present

## 2020-12-10 DIAGNOSIS — J9621 Acute and chronic respiratory failure with hypoxia: Secondary | ICD-10-CM | POA: Diagnosis not present

## 2020-12-10 DIAGNOSIS — J9611 Chronic respiratory failure with hypoxia: Secondary | ICD-10-CM | POA: Diagnosis not present

## 2020-12-10 DIAGNOSIS — N1832 Chronic kidney disease, stage 3b: Secondary | ICD-10-CM | POA: Diagnosis not present

## 2020-12-10 DIAGNOSIS — I5042 Chronic combined systolic (congestive) and diastolic (congestive) heart failure: Secondary | ICD-10-CM | POA: Diagnosis not present

## 2020-12-11 DIAGNOSIS — M9903 Segmental and somatic dysfunction of lumbar region: Secondary | ICD-10-CM | POA: Diagnosis not present

## 2020-12-11 DIAGNOSIS — M9905 Segmental and somatic dysfunction of pelvic region: Secondary | ICD-10-CM | POA: Diagnosis not present

## 2020-12-11 DIAGNOSIS — M9902 Segmental and somatic dysfunction of thoracic region: Secondary | ICD-10-CM | POA: Diagnosis not present

## 2020-12-11 DIAGNOSIS — M62830 Muscle spasm of back: Secondary | ICD-10-CM | POA: Diagnosis not present

## 2020-12-14 DIAGNOSIS — M9905 Segmental and somatic dysfunction of pelvic region: Secondary | ICD-10-CM | POA: Diagnosis not present

## 2020-12-14 DIAGNOSIS — M9902 Segmental and somatic dysfunction of thoracic region: Secondary | ICD-10-CM | POA: Diagnosis not present

## 2020-12-14 DIAGNOSIS — M9903 Segmental and somatic dysfunction of lumbar region: Secondary | ICD-10-CM | POA: Diagnosis not present

## 2020-12-14 DIAGNOSIS — M62830 Muscle spasm of back: Secondary | ICD-10-CM | POA: Diagnosis not present

## 2020-12-18 DIAGNOSIS — M62830 Muscle spasm of back: Secondary | ICD-10-CM | POA: Diagnosis not present

## 2020-12-18 DIAGNOSIS — M9903 Segmental and somatic dysfunction of lumbar region: Secondary | ICD-10-CM | POA: Diagnosis not present

## 2020-12-18 DIAGNOSIS — M9905 Segmental and somatic dysfunction of pelvic region: Secondary | ICD-10-CM | POA: Diagnosis not present

## 2020-12-18 DIAGNOSIS — M9902 Segmental and somatic dysfunction of thoracic region: Secondary | ICD-10-CM | POA: Diagnosis not present

## 2020-12-21 DIAGNOSIS — M9903 Segmental and somatic dysfunction of lumbar region: Secondary | ICD-10-CM | POA: Diagnosis not present

## 2020-12-21 DIAGNOSIS — M9905 Segmental and somatic dysfunction of pelvic region: Secondary | ICD-10-CM | POA: Diagnosis not present

## 2020-12-21 DIAGNOSIS — M9902 Segmental and somatic dysfunction of thoracic region: Secondary | ICD-10-CM | POA: Diagnosis not present

## 2020-12-21 DIAGNOSIS — M62830 Muscle spasm of back: Secondary | ICD-10-CM | POA: Diagnosis not present

## 2020-12-25 DIAGNOSIS — M9902 Segmental and somatic dysfunction of thoracic region: Secondary | ICD-10-CM | POA: Diagnosis not present

## 2020-12-25 DIAGNOSIS — M62830 Muscle spasm of back: Secondary | ICD-10-CM | POA: Diagnosis not present

## 2020-12-25 DIAGNOSIS — M9903 Segmental and somatic dysfunction of lumbar region: Secondary | ICD-10-CM | POA: Diagnosis not present

## 2020-12-25 DIAGNOSIS — M9905 Segmental and somatic dysfunction of pelvic region: Secondary | ICD-10-CM | POA: Diagnosis not present

## 2020-12-29 DIAGNOSIS — M9902 Segmental and somatic dysfunction of thoracic region: Secondary | ICD-10-CM | POA: Diagnosis not present

## 2020-12-29 DIAGNOSIS — M9903 Segmental and somatic dysfunction of lumbar region: Secondary | ICD-10-CM | POA: Diagnosis not present

## 2020-12-29 DIAGNOSIS — M62830 Muscle spasm of back: Secondary | ICD-10-CM | POA: Diagnosis not present

## 2020-12-29 DIAGNOSIS — M9905 Segmental and somatic dysfunction of pelvic region: Secondary | ICD-10-CM | POA: Diagnosis not present

## 2020-12-30 IMAGING — DX DG CHEST 1V PORT
1 series · 1 of 1 positions shown · non-contrast
Comparison: 05/22/2020

CLINICAL DATA: Shortness of breath

EXAM:
PORTABLE CHEST 1 VIEW

[chest ap]
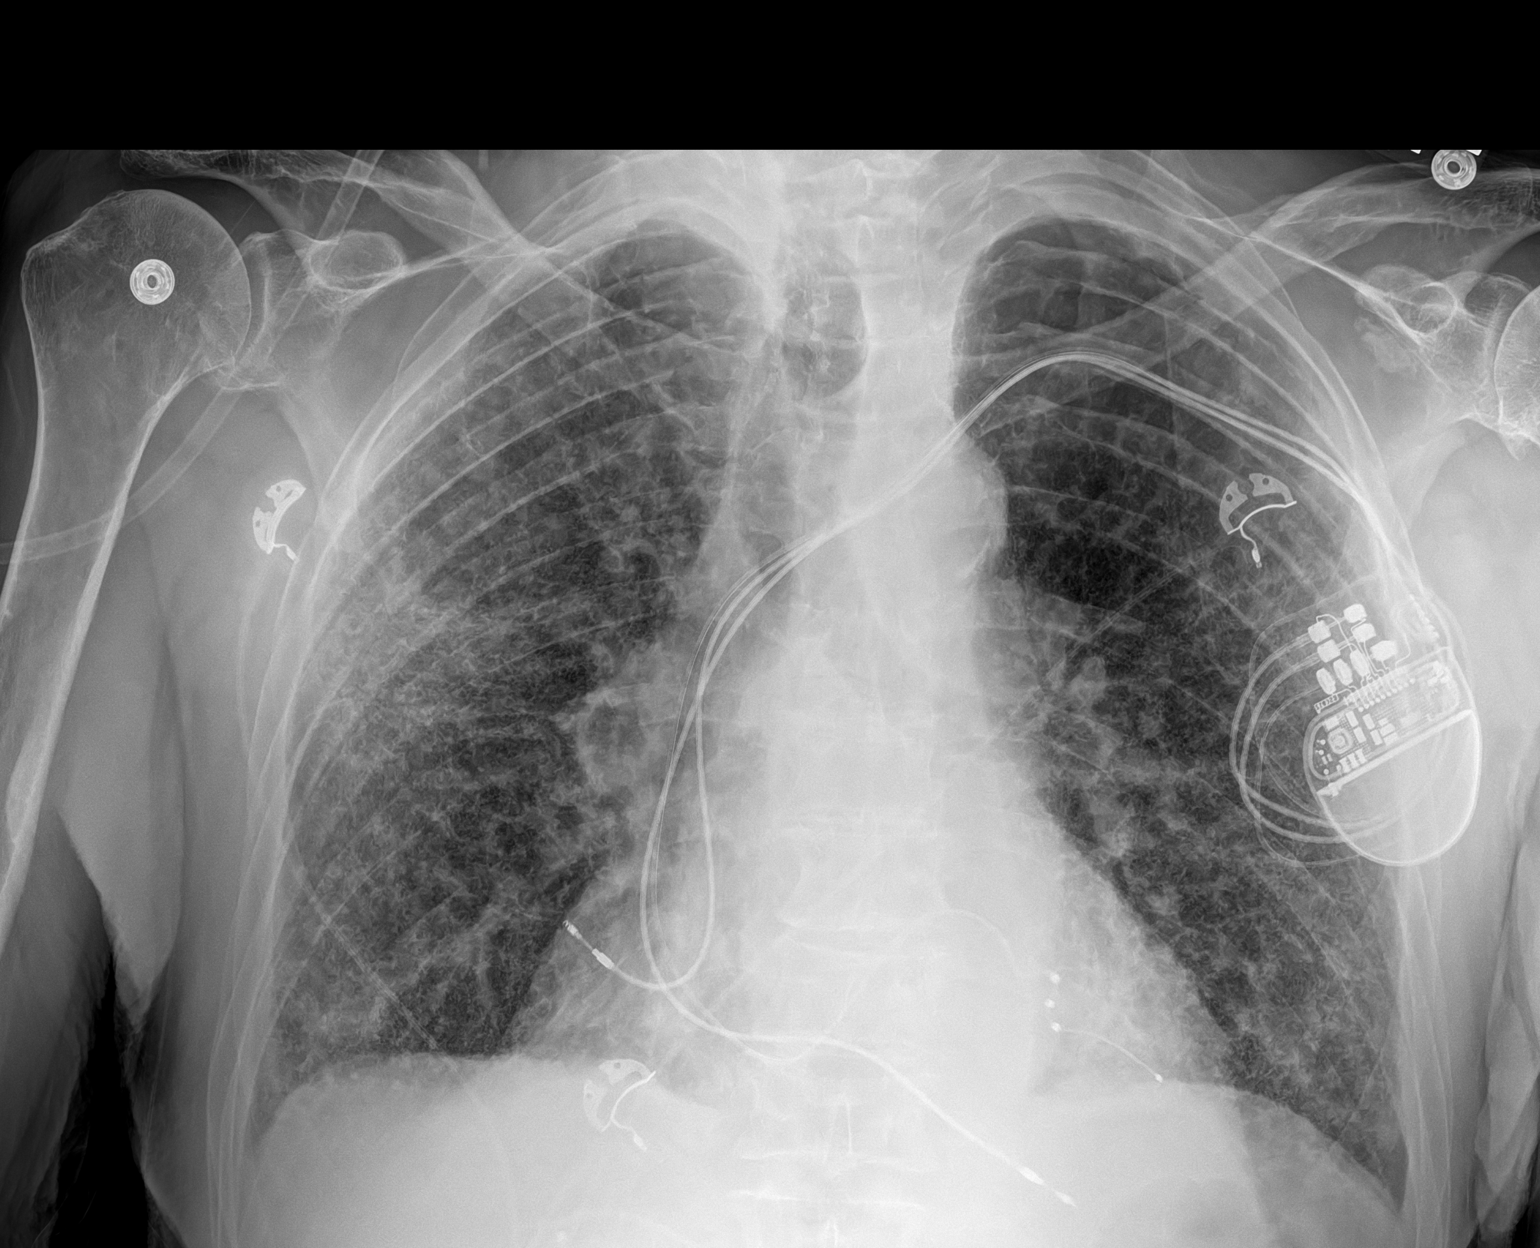

[1 of 1 positions shown; findings below may reference images not displayed]

FINDINGS: Likely chronic interstitial lung changes. Patchy superimposed
increased density. No significant pleural effusion. No pneumothorax.
Stable cardiomediastinal contours. Left chest wall pacemaker.
IMPRESSION: Patchy increased density superimposed on likely chronic interstitial
lung disease. This may reflect superimposed pneumonia or edema.

## 2021-01-01 DIAGNOSIS — M9902 Segmental and somatic dysfunction of thoracic region: Secondary | ICD-10-CM | POA: Diagnosis not present

## 2021-01-01 DIAGNOSIS — M62830 Muscle spasm of back: Secondary | ICD-10-CM | POA: Diagnosis not present

## 2021-01-01 DIAGNOSIS — M9905 Segmental and somatic dysfunction of pelvic region: Secondary | ICD-10-CM | POA: Diagnosis not present

## 2021-01-01 DIAGNOSIS — M9903 Segmental and somatic dysfunction of lumbar region: Secondary | ICD-10-CM | POA: Diagnosis not present

## 2021-01-06 ENCOUNTER — Telehealth: Payer: Self-pay | Admitting: Radiology

## 2021-01-06 DIAGNOSIS — M545 Low back pain, unspecified: Secondary | ICD-10-CM

## 2021-01-06 DIAGNOSIS — M5137 Other intervertebral disc degeneration, lumbosacral region: Secondary | ICD-10-CM

## 2021-01-06 NOTE — Telephone Encounter (Signed)
-----   Message from Carole Civil, MD sent at 01/06/2021  3:31 PM EDT ----- CAN YOU ORDER MRI FOR HIM LUMBAR   Dx   FRACTURE VERTEBRAL BODY

## 2021-01-06 NOTE — Telephone Encounter (Signed)
Dr Lemmie Evens wants scan   Need to make sure he's not claustrophobic and does not have any metal implants Left message for him to call back   I think he has pacemaker if so need to know if he has card in wallet that says MR Conditional

## 2021-01-06 NOTE — Telephone Encounter (Signed)
After reviewing further, it looks like he has had the pacemaker for a while, so it is likely not MR conditional. I sent message to Dr Lemmie Evens to see if we can change to CT scan.

## 2021-01-07 ENCOUNTER — Telehealth: Payer: Self-pay | Admitting: Radiology

## 2021-01-07 DIAGNOSIS — I5022 Chronic systolic (congestive) heart failure: Secondary | ICD-10-CM | POA: Diagnosis not present

## 2021-01-07 DIAGNOSIS — J9611 Chronic respiratory failure with hypoxia: Secondary | ICD-10-CM | POA: Diagnosis not present

## 2021-01-07 DIAGNOSIS — I480 Paroxysmal atrial fibrillation: Secondary | ICD-10-CM | POA: Diagnosis not present

## 2021-01-07 DIAGNOSIS — D72829 Elevated white blood cell count, unspecified: Secondary | ICD-10-CM | POA: Diagnosis not present

## 2021-01-07 DIAGNOSIS — Z79899 Other long term (current) drug therapy: Secondary | ICD-10-CM | POA: Diagnosis not present

## 2021-01-07 NOTE — Telephone Encounter (Signed)
-----   Message from Uvaldo Bristle sent at 01/07/2021  9:47 AM EDT ----- Regarding: note already entered Patient Max Cole, Max Cole [[id number]] called back to speak with you regarding MRI vs CT-which I see you already have a phone note routed to Dr Aline Brochure. Patient and daughter Collie Siad, also mentions daughter Santiago Glad from [facility], aware note is in progress.

## 2021-01-07 NOTE — Telephone Encounter (Signed)
Max Cole, Max Cole, RT Spoke with Max Cole, Max Cole [phone] - he will, or will have his daughter call back, or bring the card  Max Cole daughter must have it)  I found it in the chart [place]  ok for MRI   Will send card to April scan will have to be done at [facility]

## 2021-01-07 NOTE — Telephone Encounter (Signed)
Done

## 2021-01-07 NOTE — Telephone Encounter (Signed)
Dr Aline Brochure said daughter is checking to see if he can have MRI scan, If she will get me the card, I can tell her yes or no  I have left message for patient to call back

## 2021-01-07 NOTE — Telephone Encounter (Signed)
Daughter called  back a staff message was sent.  She has asked about MR vs CT Im not sure, discussed with Dr Lemmie Evens he said daughter is supposed to call number on card to see, but I told him it would be on the card if he could have MR or not.   Michele Rockers I left message with him, I need the card.

## 2021-01-08 NOTE — Telephone Encounter (Signed)
Not able to get him on phone, order sent to [facility] for scheduling, they will call him

## 2021-01-09 DIAGNOSIS — N1832 Chronic kidney disease, stage 3b: Secondary | ICD-10-CM | POA: Diagnosis not present

## 2021-01-09 DIAGNOSIS — I5042 Chronic combined systolic (congestive) and diastolic (congestive) heart failure: Secondary | ICD-10-CM | POA: Diagnosis not present

## 2021-01-09 DIAGNOSIS — J9611 Chronic respiratory failure with hypoxia: Secondary | ICD-10-CM | POA: Diagnosis not present

## 2021-01-09 DIAGNOSIS — J9621 Acute and chronic respiratory failure with hypoxia: Secondary | ICD-10-CM | POA: Diagnosis not present

## 2021-01-11 NOTE — Telephone Encounter (Signed)
MRI now scheduled.

## 2021-01-15 DIAGNOSIS — I480 Paroxysmal atrial fibrillation: Secondary | ICD-10-CM | POA: Diagnosis not present

## 2021-01-15 DIAGNOSIS — I5022 Chronic systolic (congestive) heart failure: Secondary | ICD-10-CM | POA: Diagnosis not present

## 2021-01-15 DIAGNOSIS — S32019A Unspecified fracture of first lumbar vertebra, initial encounter for closed fracture: Secondary | ICD-10-CM | POA: Diagnosis not present

## 2021-01-15 DIAGNOSIS — N1832 Chronic kidney disease, stage 3b: Secondary | ICD-10-CM | POA: Diagnosis not present

## 2021-01-28 ENCOUNTER — Other Ambulatory Visit: Payer: Self-pay

## 2021-01-28 ENCOUNTER — Ambulatory Visit (HOSPITAL_COMMUNITY)
Admission: RE | Admit: 2021-01-28 | Discharge: 2021-01-28 | Disposition: A | Discharge status: Home / Self Care | Payer: Medicare HMO | Source: Ambulatory Visit | Attending: Orthopedic Surgery | Admitting: Orthopedic Surgery

## 2021-01-28 DIAGNOSIS — M5137 Other intervertebral disc degeneration, lumbosacral region: Secondary | ICD-10-CM | POA: Insufficient documentation

## 2021-01-28 DIAGNOSIS — M545 Low back pain, unspecified: Secondary | ICD-10-CM | POA: Diagnosis not present

## 2021-02-01 ENCOUNTER — Ambulatory Visit (INDEPENDENT_AMBULATORY_CARE_PROVIDER_SITE_OTHER): Payer: Medicare HMO

## 2021-02-01 DIAGNOSIS — I428 Other cardiomyopathies: Secondary | ICD-10-CM | POA: Diagnosis not present

## 2021-02-07 IMAGING — DX DG CHEST 2V
2 series · 2 of 2 positions shown · non-contrast
Comparison: Multiple prior plain film most recent 05/31/2020. Prior
CT 05/22/2020 07/09/2015

CLINICAL DATA: [AGE] male with possible amiodarone pulmonary
toxicity

EXAM:
CHEST - 2 VIEW

[chest lat]
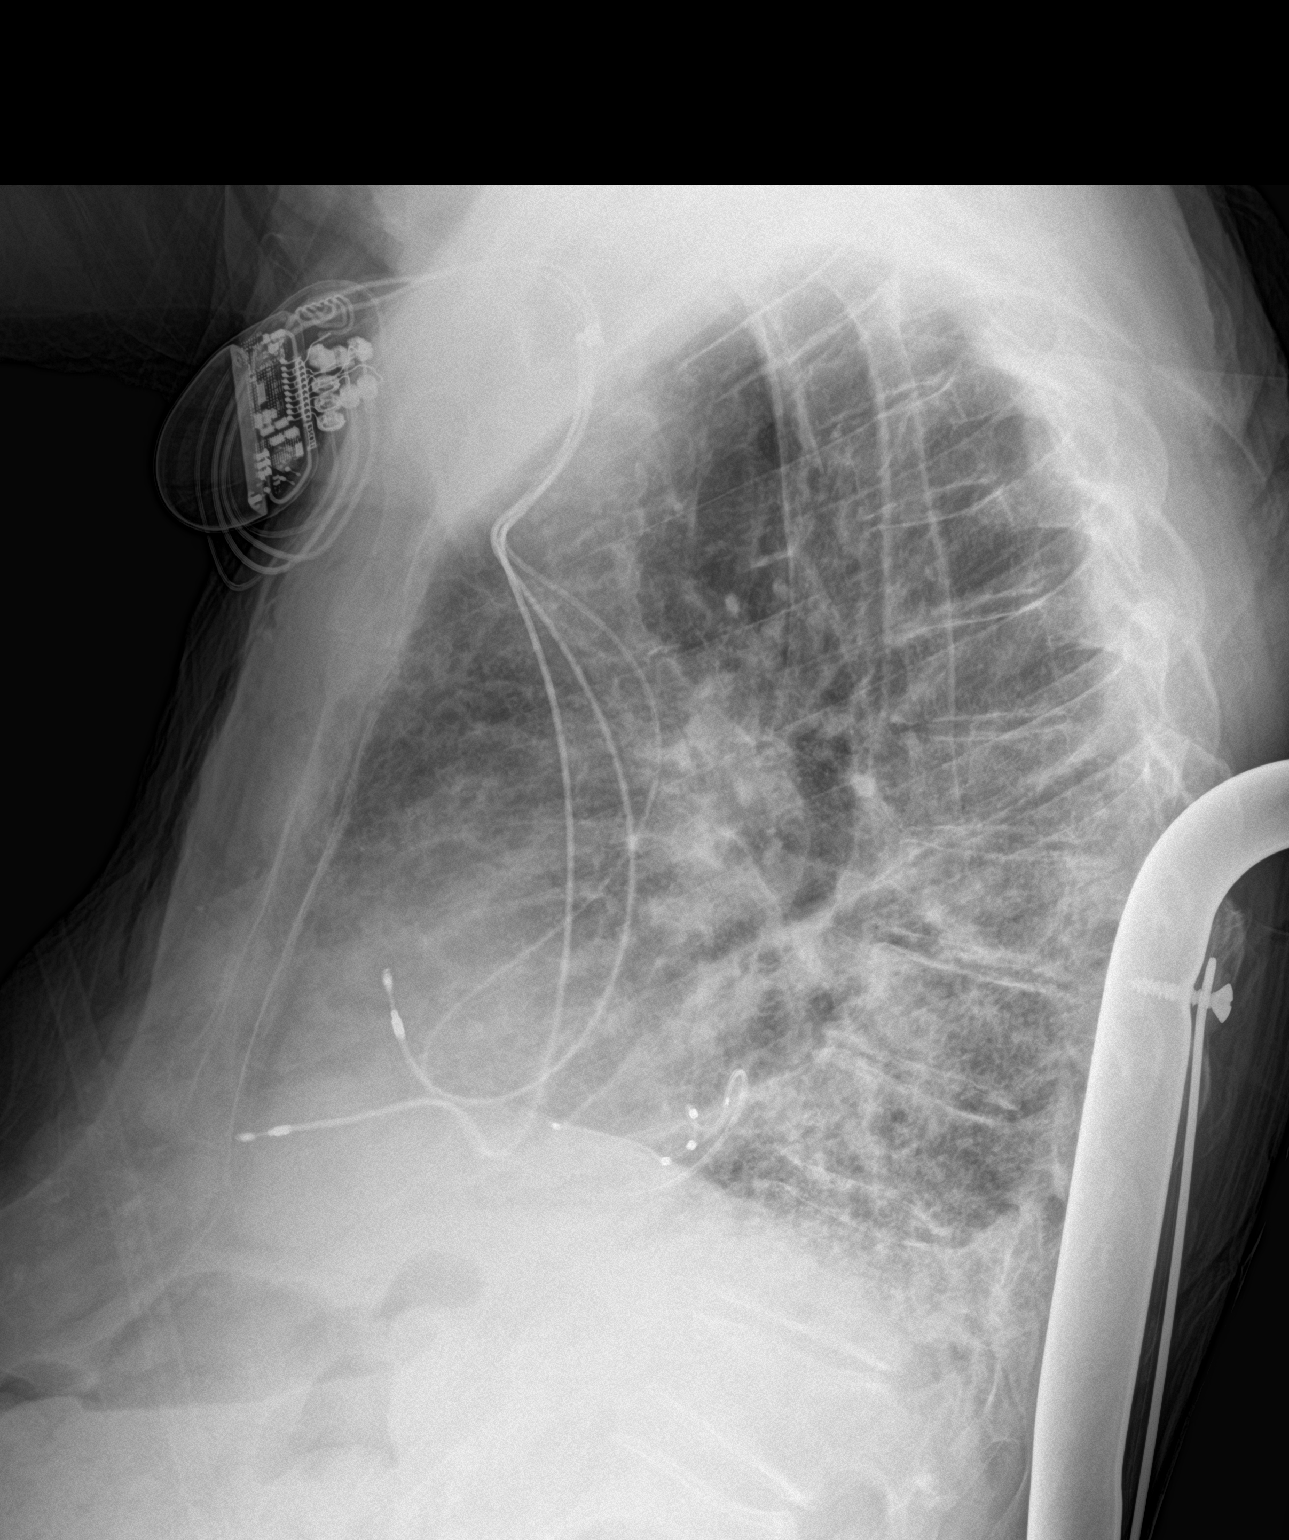

[chest ap]
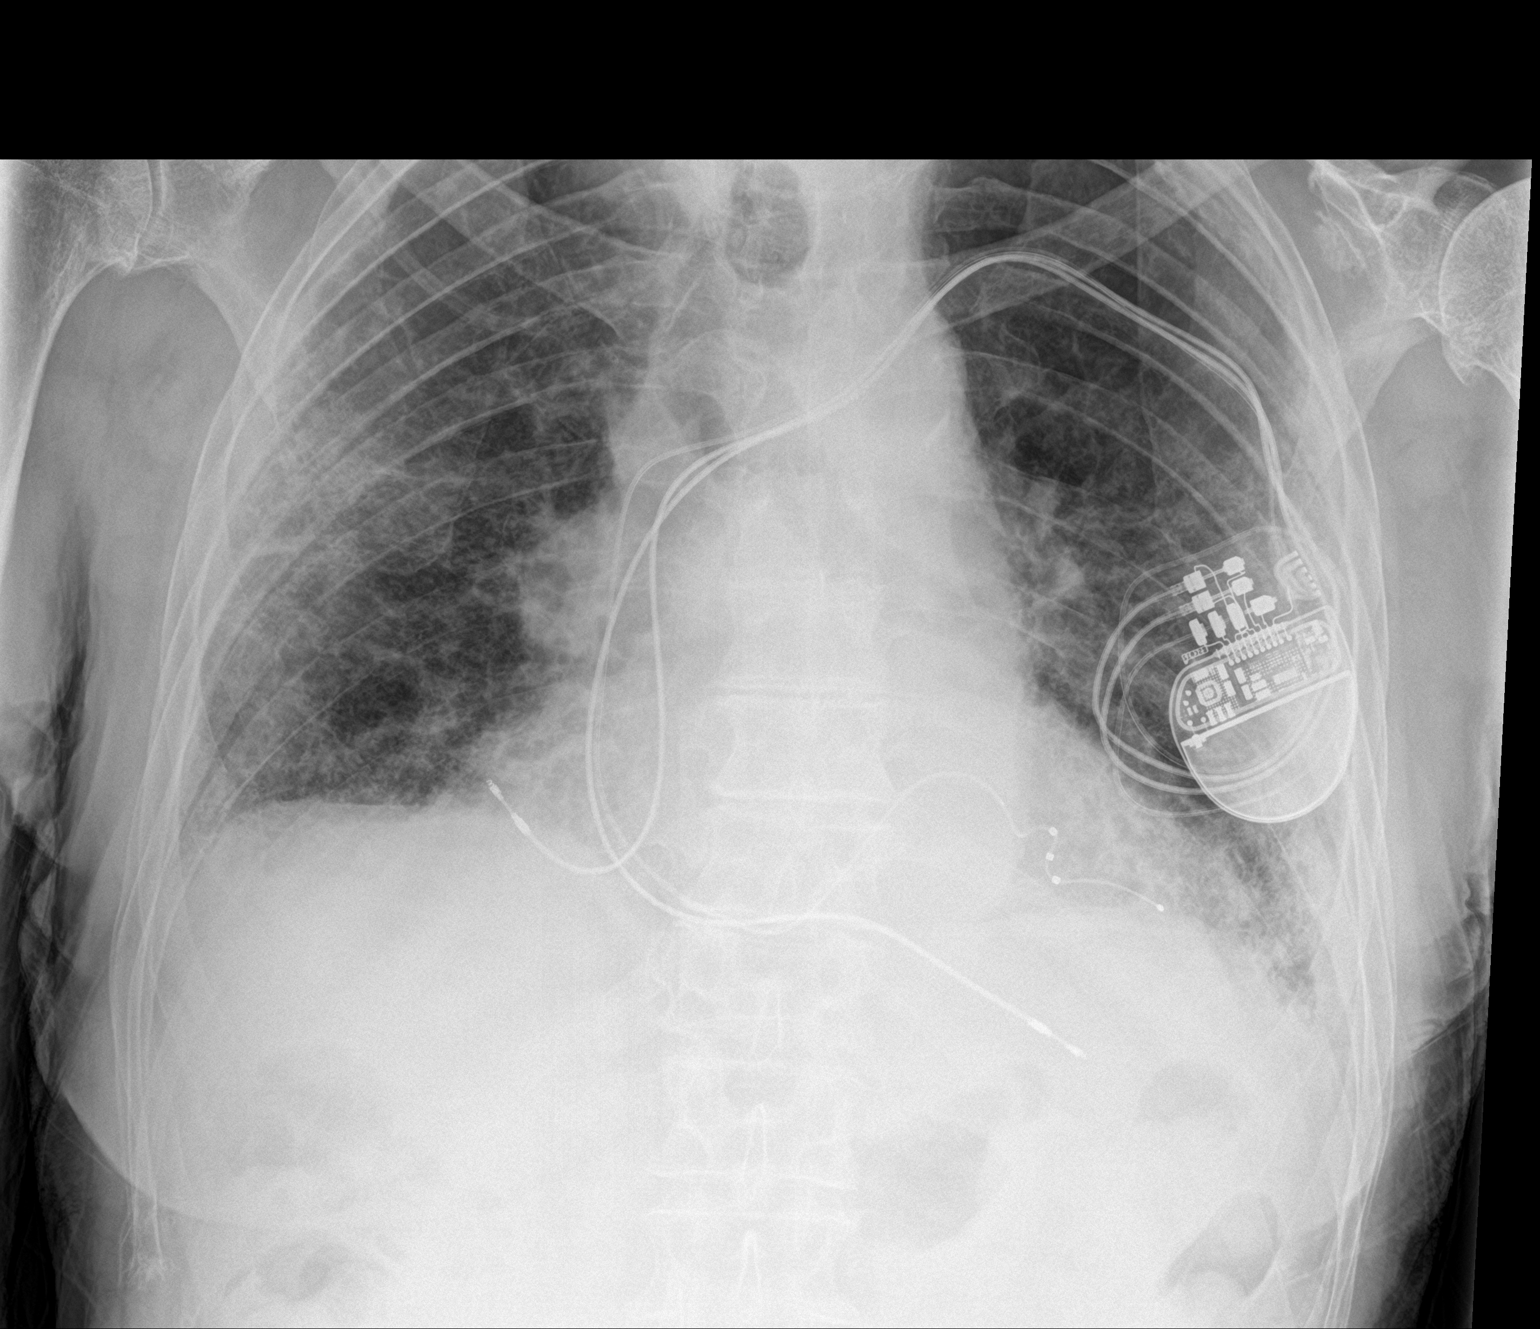

[2 of 2 positions shown; findings below may reference images not displayed]

FINDINGS: Cardiomediastinal silhouette unchanged.

Low lung volumes persist. Similar appearance of reticular pattern of
opacity of the bilateral lungs, predominantly at the periphery and
the lung bases. Pattern is new from the plain film 07/09/2015 and
unchanged from 05/22/2020

No pneumothorax.

No large pleural effusion.

Unchanged cardiac pacing device
IMPRESSION: Similar appearance of low lung volumes and reticular opacity,
relatively unchanged dating to 05/22/2020, and new from 07/09/2015.
The differential diagnosis includes infectious etiology (including
atypical infection), as well as chronic changes of interstitial
fibrosis (including drug toxicities).

## 2021-02-08 LAB — CUP PACEART REMOTE DEVICE CHECK
Battery Remaining Longevity: 66 mo
Battery Remaining Percentage: 71 %
Brady Statistic RA Percent Paced: 4 %
Brady Statistic RV Percent Paced: 98 %
Date Time Interrogation Session: 20220811202400
Implantable Lead Implant Date: 20170106
Implantable Lead Implant Date: 20170106
Implantable Lead Implant Date: 20170106
Implantable Lead Location: 753858
Implantable Lead Location: 753859
Implantable Lead Location: 753860
Implantable Lead Model: 4677
Implantable Lead Model: 7741
Implantable Lead Model: 7742
Implantable Lead Serial Number: 507382
Implantable Lead Serial Number: 695539
Implantable Lead Serial Number: 724660
Implantable Pulse Generator Implant Date: 20170106
Lead Channel Impedance Value: 623 Ohm
Lead Channel Impedance Value: 623 Ohm
Lead Channel Impedance Value: 671 Ohm
Lead Channel Pacing Threshold Amplitude: 0.8 V
Lead Channel Pacing Threshold Amplitude: 1.2 V
Lead Channel Pacing Threshold Pulse Width: 0.4 ms
Lead Channel Pacing Threshold Pulse Width: 0.8 ms
Lead Channel Setting Pacing Amplitude: 2 V
Lead Channel Setting Pacing Amplitude: 2 V
Lead Channel Setting Pacing Amplitude: 2.2 V
Lead Channel Setting Pacing Pulse Width: 0.4 ms
Lead Channel Setting Pacing Pulse Width: 0.8 ms
Lead Channel Setting Sensing Sensitivity: 2.5 mV
Lead Channel Setting Sensing Sensitivity: 2.5 mV
Pulse Gen Serial Number: 714587

## 2021-02-09 DIAGNOSIS — I5042 Chronic combined systolic (congestive) and diastolic (congestive) heart failure: Secondary | ICD-10-CM | POA: Diagnosis not present

## 2021-02-09 DIAGNOSIS — N1832 Chronic kidney disease, stage 3b: Secondary | ICD-10-CM | POA: Diagnosis not present

## 2021-02-09 DIAGNOSIS — J9621 Acute and chronic respiratory failure with hypoxia: Secondary | ICD-10-CM | POA: Diagnosis not present

## 2021-02-09 DIAGNOSIS — J9611 Chronic respiratory failure with hypoxia: Secondary | ICD-10-CM | POA: Diagnosis not present

## 2021-02-11 ENCOUNTER — Telehealth: Payer: Self-pay | Admitting: Orthopedic Surgery

## 2021-02-11 DIAGNOSIS — M545 Low back pain, unspecified: Secondary | ICD-10-CM

## 2021-02-11 NOTE — Telephone Encounter (Signed)
Patient's daughter Tressie Stalker, designated contact, called back to follow up on message. Aware Dr Aline Brochure is out of clinic this afternoon and returning to clinic Monday.

## 2021-02-11 NOTE — Telephone Encounter (Signed)
Patient's daughter Santiago Glad called and said that [facility] had his MRI and they have seen the results on mychart.  They want to know if he needs an appointment here or what is the next step for him  Please advise  Thanks

## 2021-02-14 ENCOUNTER — Ambulatory Visit
Admission: EM | Admit: 2021-02-14 | Discharge: 2021-02-14 | Disposition: A | Discharge status: Home / Self Care | Payer: Medicare HMO | Attending: Physician Assistant | Admitting: Physician Assistant

## 2021-02-14 ENCOUNTER — Other Ambulatory Visit: Payer: Self-pay

## 2021-02-14 ENCOUNTER — Encounter: Payer: Self-pay | Admitting: Emergency Medicine

## 2021-02-14 DIAGNOSIS — U071 COVID-19: Secondary | ICD-10-CM

## 2021-02-14 MED ORDER — NIRMATRELVIR/RITONAVIR (PAXLOVID) TABLET (RENAL DOSING): 2.0000 | ORAL_TABLET | Freq: Two times a day (BID) | ORAL | 0 refills | Status: DC

## 2021-02-14 MED ORDER — NIRMATRELVIR/RITONAVIR (PAXLOVID) TABLET (RENAL DOSING): 2.0000 | ORAL_TABLET | Freq: Two times a day (BID) | ORAL | 0 refills | Status: AC

## 2021-02-14 NOTE — Discharge Instructions (Addendum)
Take medication as prescribed pending lab results Treat symptoms supportively at home Push fluids, may take Flonase daily and Mucinex as needed for postnasal drip.

## 2021-02-14 NOTE — ED Provider Notes (Signed)
[facility]    CSN: [id number] Arrival date & time: 02/14/21  [id number]      History   Chief Complaint No chief complaint on file.   HPI Max Cole is a 87 y.o. male.   Pt tested positive for COVID by home test last night.  Complains of fever and chills.  He uses supplemental O2 continuously. Denies cough, shortness of breath.    Past Medical History:  Diagnosis Date   Atrial fibrillation ([place])    Bradycardia    Cardiomyopathy 2005   a. Possibly alcoholic; b. cath in 0000000 ostial D1, PCW of 12, EF of 35-40%;  c. EF of 0.25 in 11/2003;  d. 40-45% in 05/2004;  e. 25% in 10/2008 by echo;  f. 04/2015 Echo: EF 40-45%, diff HK, sev inflat/inf HK, Gr 1 DD, mild AI, triv TR.   Carotid artery occlusion    Cerebrovascular disease    Carotid ultrasound in 12/2010-60-79% left internal carotid artery, less than 40% or right RICA, no change   Chronic kidney disease    Creatinine of 1.6 in 2009   Hyperlipidemia 10/15/2011   Lipid profile in 03/2009:231, 136, 56, 148   Left bundle branch block    Prostate carcinoma ([place])    Syncope    [place] CRT-P 07/03/15 Dr. Lovena Le    Patient Active Problem List   Diagnosis Date Noted   Anemia 12/03/2020   Personal history of noncompliance with medical treatment, presenting hazards to health 12/03/2020   Leukocytosis 06/12/2020   Aortic atherosclerosis ([place]) 06/04/2020   BPH without urinary obstruction 06/04/2020   Anemia due to stage 3 chronic kidney disease ([place]) 06/04/2020   History of prostate cancer 06/04/2020   Chronic constipation 06/04/2020   Protein-calorie malnutrition, severe ([place]) 06/04/2020   Lobar pneumonia ([place]) 05/23/2020   Respiratory failure with hypoxia ([place]) 05/22/2020   Chronic a-fib ([place]) 05/22/2020   Pneumonia in infectious disease---??? Atypical Pneumonia [id number]   Systolic and diastolic CHF, chronic ([place]) 02/06/2020   Pacemaker 07/03/2015   Syncope 05/20/2015   Nonischemic cardiomyopathy ([place]) 05/20/2015    Carotid stenosis, bilateral 11/19/2013   Hyperlipidemia 10/15/2011   Cerebrovascular disease    Left bundle branch block    Cardiomyopathy, nonischemic (HCC)    CKD (chronic kidney disease) stage 3, GFR 30-59 ml/min (HCC)     Past Surgical History:  Procedure Laterality Date   CATARACT EXTRACTION     Right   COLONOSCOPY N/A 08/16/2012   Procedure: COLONOSCOPY;  Surgeon: Rogene Houston, MD;  [facility];  Service: Endoscopy;  Laterality: N/A;  930   COLONOSCOPY N/A 10/21/2015   Procedure: COLONOSCOPY;  Surgeon: Rogene Houston, MD;  [facility];  Service: Endoscopy;  Laterality: N/A;  1200   COLONOSCOPY W/ POLYPECTOMY  2010   Diverticulosis   EP IMPLANTABLE DEVICE N/A 07/03/2015   Procedure: BiV Pacemaker Insertion CRT-P;  Surgeon: Evans Lance, MD;  Location: [facility];  Service: Cardiovascular;  Laterality: N/A;   EYE SURGERY Right    Cataract   HERNIA REPAIR         Home Medications    Prior to Admission medications   Medication Sig Start Date End Date Taking? Authorizing Provider  acetaminophen (TYLENOL) 500 MG tablet Take 500 mg by mouth every 6 (six) hours as needed.    [provider]  apixaban (ELIQUIS) 2.5 MG TABS tablet Take 1 tablet (2.5 mg total) by mouth 2 (two) times daily. 06/10/20  Gerlene Fee, NP  carvedilol (COREG) 6.25 MG tablet Take 1 tablet (6.25 mg total) by mouth 2 (two) times daily with a meal. 06/10/20   Nyoka Cowden, Phylis Bougie, NP  feeding supplement (ENSURE ENLIVE / ENSURE PLUS) LIQD Take 237 mLs by mouth 2 (two) times daily between meals. 06/09/20   [provider]  ferrous sulfate 325 (65 FE) MG tablet Take 1 tablet (325 mg total) by mouth daily with breakfast. 06/10/20   Gerlene Fee, NP  furosemide (LASIX) 20 MG tablet Take 1 tablet (20 mg total) by mouth daily. 06/10/20   Gerlene Fee, NP  HYDROcodone-acetaminophen (NORCO/VICODIN) 5-325 MG tablet Take 1 tablet by mouth in the morning and  at bedtime.    [provider]  Ipratropium-Albuterol (COMBIVENT RESPIMAT) 20-100 MCG/ACT AERS respimat Inhale 1 puff into the lungs every 6 (six) hours as needed for wheezing or shortness of breath. Patient not taking: Reported on 12/03/2020 06/10/20   Gerlene Fee, NP  methocarbamol (ROBAXIN) 500 MG tablet Take 1 tablet (500 mg total) by mouth 3 (three) times daily. 06/10/20   Gerlene Fee, NP  NON FORMULARY Diet: ____ Regular, ___x___ NAS, _______Consistent Carbohydrate, _______NPO _____Other 06/03/20   [provider]  OXYGEN Inhale 4 L into the lungs continuous. 06/04/20   [provider]  polyethylene glycol (MIRALAX / GLYCOLAX) 17 g packet Take 17 g by mouth daily. 06/03/20   Barton Dubois, MD  pravastatin (PRAVACHOL) 20 MG tablet Take 1 tablet (20 mg total) by mouth every evening. 06/10/20   Gerlene Fee, NP  traZODone (DESYREL) 50 MG tablet Take 50 mg by mouth at bedtime. Patient not taking: Reported on 12/03/2020    [provider]    Family History Family History  Problem Relation Age of Onset   Hypertension Mother     Social History Social History   Tobacco Use   Smoking status: Former    Packs/day: 1.00    Years: 30.00    Pack years: 30.00    Types: Cigarettes    Quit date: 09/28/1973    Years since quitting: 47.4   Smokeless tobacco: Never  Vaping Use   Vaping Use: Never used  Substance Use Topics   Alcohol use: Yes    Alcohol/week: 5.0 standard drinks    Types: 5 Glasses of wine per week    Comment: History of excessive alcohol use; Alternates between glass of wine vs. Liquor drink 5 days per week (04/2015)   Drug use: No     Allergies   Patient has no known allergies.   Review of Systems Review of Systems  Constitutional:  Positive for chills and fever.  HENT:  Positive for postnasal drip. Negative for ear pain and sore throat.   Eyes:  Negative for pain and visual disturbance.  Respiratory:  Negative for  cough and shortness of breath.   Cardiovascular:  Negative for chest pain and palpitations.  Gastrointestinal:  Negative for abdominal pain and vomiting.  Genitourinary:  Negative for dysuria and hematuria.  Musculoskeletal:  Negative for arthralgias and back pain.  Skin:  Negative for color change and rash.  Neurological:  Negative for seizures and syncope.  All other systems reviewed and are negative.   Physical Exam Triage Vital Signs ED Triage Vitals  Enc Vitals Group     BP 02/14/21 0946 (!) 104/59     Pulse Rate 02/14/21 0946 77     Resp 02/14/21 0946 18     Temp  02/14/21 0946 98.6 F (37 C)     Temp Source 02/14/21 0946 Temporal     SpO2 02/14/21 0946 92 %     Weight --      Height --      Head Circumference --      Peak Flow --      Pain Score 02/14/21 0949 0     Pain Loc --      Pain Edu? --      Excl. in [place]? --    No data found.  Updated Vital Signs BP (!) 104/59 (BP Location: Right Arm)   Pulse 77   Temp 98.6 F (37 C) (Temporal)   Resp 18   SpO2 92%   Visual Acuity Right Eye Distance:   Left Eye Distance:   Bilateral Distance:    Right Eye Near:   Left Eye Near:    Bilateral Near:     Physical Exam Vitals and nursing note reviewed.  Constitutional:      Appearance: He is well-developed.  HENT:     Head: Normocephalic and atraumatic.  Eyes:     Conjunctiva/sclera: Conjunctivae normal.  Cardiovascular:     Rate and Rhythm: Normal rate and regular rhythm.     Heart sounds: No murmur heard. Pulmonary:     Effort: Pulmonary effort is normal. No respiratory distress.     Breath sounds: No decreased breath sounds.     Comments: coarse breath sounds bilaterally Abdominal:     Palpations: Abdomen is soft.     Tenderness: There is no abdominal tenderness.  Musculoskeletal:     Cervical back: Neck supple.  Skin:    General: Skin is warm and dry.  Neurological:     Mental Status: He is alert.     UC Treatments / Results  Labs (all labs  ordered are listed, but only abnormal results are displayed) Labs Reviewed - No data to display  EKG   Radiology No results found.  Procedures Procedures (including critical care time)  Medications Ordered in UC Medications - No data to display  Initial Impression / Assessment and Plan / UC Course  I have reviewed the triage vital signs and the nursing notes.  Pertinent labs & imaging results that were available during my care of the patient were reviewed by me and considered in my medical decision making (see chart for details).     Chronic lung disease, follows with pulmonologist, uses supplemental O2 continuously.  GFR 4/22 40.  Will repeat labs today to check GFR. COVID positive. Paxlovid sent to pharmacy, will start pending lab results.  Supportive treatment recommended.  Pt well appearing, in no acute distress, vitals stable.  Stable for discharge.  Return precautions discussed.  Final Clinical Impressions(s) / UC Diagnoses   Final diagnoses:  COVID   Discharge Instructions   None    ED Prescriptions   None    PDMP not reviewed this encounter.   Ward, Lenise Arena, PA-C 02/14/21 1031

## 2021-02-14 NOTE — ED Triage Notes (Signed)
Tested positive for covid last night.  Chills and fever yesterday.  Would like to be started on antivirals.

## 2021-02-15 LAB — BASIC METABOLIC PANEL
BUN/Creatinine Ratio: 16 (ref 10–24)
BUN: 24 mg/dL (ref 10–36)
CO2: 23 mmol/L (ref 20–29)
Calcium: 9.1 mg/dL (ref 8.6–10.2)
Chloride: 97 mmol/L (ref 96–106)
Creatinine, Ser: 1.47 mg/dL — ABNORMAL HIGH (ref 0.76–1.27)
Glucose: 105 mg/dL — ABNORMAL HIGH (ref 65–99)
Potassium: 4.5 mmol/L (ref 3.5–5.2)
Sodium: 134 mmol/L (ref 134–144)
eGFR: 45 mL/min/{1.73_m2} — ABNORMAL LOW (ref >59)

## 2021-02-18 NOTE — Telephone Encounter (Signed)
Patient's daughter called again.   Can you call her regarding her questions?  Thanks so much

## 2021-02-18 NOTE — Telephone Encounter (Signed)
I didn't get this message when it was sent first time.  I will send to Dr Aline Brochure to see if he can call patient with MRI report he has Covid

## 2021-02-24 NOTE — Progress Notes (Signed)
Remote pacemaker transmission.   

## 2021-02-25 DIAGNOSIS — S32001A Stable burst fracture of unspecified lumbar vertebra, initial encounter for closed fracture: Secondary | ICD-10-CM | POA: Diagnosis not present

## 2021-02-25 DIAGNOSIS — I10 Essential (primary) hypertension: Secondary | ICD-10-CM | POA: Diagnosis not present

## 2021-03-02 ENCOUNTER — Other Ambulatory Visit: Payer: Self-pay | Admitting: Neurological Surgery

## 2021-03-02 DIAGNOSIS — S32001A Stable burst fracture of unspecified lumbar vertebra, initial encounter for closed fracture: Secondary | ICD-10-CM

## 2021-03-05 ENCOUNTER — Ambulatory Visit
Admission: RE | Admit: 2021-03-05 | Discharge: 2021-03-05 | Disposition: A | Discharge status: Home / Self Care | Payer: Medicare HMO | Source: Ambulatory Visit | Attending: Neurological Surgery | Admitting: Neurological Surgery

## 2021-03-05 ENCOUNTER — Other Ambulatory Visit (HOSPITAL_COMMUNITY): Payer: Self-pay | Admitting: Interventional Radiology

## 2021-03-05 ENCOUNTER — Other Ambulatory Visit: Payer: Self-pay

## 2021-03-05 DIAGNOSIS — S32001A Stable burst fracture of unspecified lumbar vertebra, initial encounter for closed fracture: Secondary | ICD-10-CM

## 2021-03-05 DIAGNOSIS — S32010A Wedge compression fracture of first lumbar vertebra, initial encounter for closed fracture: Secondary | ICD-10-CM | POA: Diagnosis not present

## 2021-03-05 NOTE — Consult Note (Signed)
Chief Complaint: Patient was seen in consultation today for painful lumbar compression fracture at the request of Ostergard,Thomas A  Referring Physician(s): Ostergard,Thomas A  History of Present Illness: Max Cole is a 87 y.o. male with a complex medical history who suffered a fall on Nov 11, 2020 after some lightheadedness, after which time he had severe low back pain.  He had previously had some right hip pain and he feels like his right thigh pain and weakness is also been exacerbated.  He rates his pain 10 out of 10 on the visual analog pain scale at its worst, 8 out of 10 when up and about, improved to 1 out of 10 when sitting, 0 out of 10 transiently when he is not moving and takes his pain medications.  He is using a combination of hydrocodone and Robaxin for pain control, which gives him short-term pain relief in the morning and at night.  He has no known history of previous vertebral compression fractures.  No history of diagnosis of osteopenia or osteoporosis.  He scores 16 out of 24 on the [employer] disability questionnaire.  Before the injury, he was able to ambulate independently.  Currently, it hurts to stand erect, can only be up and about for about 15 minutes until his back pain is exacerbating requiring discontinuation of activity. He was accompanied today by his daughter who also participated in the conversation.  Past Medical History:  Diagnosis Date   Atrial fibrillation ([place])    Bradycardia    Cardiomyopathy 2005   a. Possibly alcoholic; b. cath in 0000000 ostial D1, PCW of 12, EF of 35-40%;  c. EF of 0.25 in 11/2003;  d. 40-45% in 05/2004;  e. 25% in 10/2008 by echo;  f. 04/2015 Echo: EF 40-45%, diff HK, sev inflat/inf HK, Gr 1 DD, mild AI, triv TR.   Carotid artery occlusion    Cerebrovascular disease    Carotid ultrasound in 12/2010-60-79% left internal carotid artery, less than 40% or right RICA, no change   Chronic kidney disease    Creatinine of 1.6  in 2009   Hyperlipidemia 10/15/2011   Lipid profile in 03/2009:231, 136, 56, 148   Left bundle branch block    Prostate carcinoma ([place])    Syncope    [place] CRT-P 07/03/15 Dr. Lovena Le    Past Surgical History:  Procedure Laterality Date   CATARACT EXTRACTION     Right   COLONOSCOPY N/A 08/16/2012   Procedure: COLONOSCOPY;  Surgeon: Rogene Houston, MD;  [facility];  Service: Endoscopy;  Laterality: N/A;  930   COLONOSCOPY N/A 10/21/2015   Procedure: COLONOSCOPY;  Surgeon: Rogene Houston, MD;  [facility];  Service: Endoscopy;  Laterality: N/A;  1200   COLONOSCOPY W/ POLYPECTOMY  2010   Diverticulosis   EP IMPLANTABLE DEVICE N/A 07/03/2015   Procedure: BiV Pacemaker Insertion CRT-P;  Surgeon: Evans Lance, MD;  Location: [facility];  Service: Cardiovascular;  Laterality: N/A;   EYE SURGERY Right    Cataract   HERNIA REPAIR      Allergies: Patient has no known allergies.  Medications: Prior to Admission medications   Medication Sig Start Date End Date Taking? Authorizing Provider  acetaminophen (TYLENOL) 500 MG tablet Take 500 mg by mouth every 6 (six) hours as needed.    [provider]  apixaban (ELIQUIS) 2.5 MG TABS tablet Take 1 tablet (2.5 mg total) by mouth 2 (two) times daily. 06/10/20  Gerlene Fee, NP  carvedilol (COREG) 6.25 MG tablet Take 1 tablet (6.25 mg total) by mouth 2 (two) times daily with a meal. 06/10/20   Nyoka Cowden, Phylis Bougie, NP  feeding supplement (ENSURE ENLIVE / ENSURE PLUS) LIQD Take 237 mLs by mouth 2 (two) times daily between meals. 06/09/20   [provider]  ferrous sulfate 325 (65 FE) MG tablet Take 1 tablet (325 mg total) by mouth daily with breakfast. 06/10/20   Gerlene Fee, NP  furosemide (LASIX) 20 MG tablet Take 1 tablet (20 mg total) by mouth daily. 06/10/20   Gerlene Fee, NP  HYDROcodone-acetaminophen (NORCO/VICODIN) 5-325 MG tablet Take 1 tablet by mouth in the morning and at bedtime.     [provider]  Ipratropium-Albuterol (COMBIVENT RESPIMAT) 20-100 MCG/ACT AERS respimat Inhale 1 puff into the lungs every 6 (six) hours as needed for wheezing or shortness of breath. Patient not taking: Reported on 12/03/2020 06/10/20   Gerlene Fee, NP  methocarbamol (ROBAXIN) 500 MG tablet Take 1 tablet (500 mg total) by mouth 3 (three) times daily. 06/10/20   Gerlene Fee, NP  NON FORMULARY Diet: ____ Regular, ___x___ NAS, _______Consistent Carbohydrate, _______NPO _____Other 06/03/20   [provider]  OXYGEN Inhale 4 L into the lungs continuous. 06/04/20   [provider]  polyethylene glycol (MIRALAX / GLYCOLAX) 17 g packet Take 17 g by mouth daily. 06/03/20   Barton Dubois, MD  pravastatin (PRAVACHOL) 20 MG tablet Take 1 tablet (20 mg total) by mouth every evening. 06/10/20   Gerlene Fee, NP  traZODone (DESYREL) 50 MG tablet Take 50 mg by mouth at bedtime. Patient not taking: Reported on 12/03/2020    [provider]     Family History  Problem Relation Age of Onset   Hypertension Mother     Social History   Socioeconomic History   Marital status: Married    Spouse name: Not on file   Number of children: Not on file   Years of education: Not on file   Highest education level: Not on file  Occupational History   Not on file  Tobacco Use   Smoking status: Former    Packs/day: 1.00    Years: 30.00    Pack years: 30.00    Types: Cigarettes    Quit date: 09/28/1973    Years since quitting: 47.4   Smokeless tobacco: Never  Vaping Use   Vaping Use: Never used  Substance and Sexual Activity   Alcohol use: Yes    Alcohol/week: 5.0 standard drinks    Types: 5 Glasses of wine per week    Comment: History of excessive alcohol use; Alternates between glass of wine vs. Liquor drink 5 days per week (04/2015)   Drug use: No   Sexual activity: Not on file  Other Topics Concern   Not on file  Social History Narrative   Not on file    Social Determinants of Health   Financial Resource Strain: Not on file  Food Insecurity: Not on file  Transportation Needs: Not on file  Physical Activity: Not on file  Stress: Not on file  Social Connections: Not on file    ECOG Status: 2 - Symptomatic, <50% confined to bed  Review of Systems: A 12 point ROS discussed and pertinent positives are indicated in the HPI above.  All other systems are negative.  Review of Systems  Vital Signs: There were no vitals taken for this visit.  Physical Exam  Constitutional: Oriented to person, place, and time. Well-developed and well-nourished. No distress.   HENT:  Head: Normocephalic and atraumatic.  Eyes: Conjunctivae and EOM are normal. Right eye exhibits no discharge. Left eye exhibits no discharge. No scleral icterus.  Neck: No JVD present.  Pulmonary/Chest: Effort normal. No stridor. No respiratory distress.  Abdomen: soft, non distended Neurological:  alert and oriented to person, place, and time.  Skin: Skin is warm and dry.  not diaphoretic.  Psychiatric:   normal mood and affect.   behavior is normal. Judgment and thought content normal.  Musculoskeletal: Tenderness over the upper lumbar spine to palpation. Mallampati Score:     Imaging: MRI LUMBAR SPINE WITHOUT CONTRAST  TECHNIQUE: Multiplanar, multisequence MR imaging of the lumbar spine was performed. No intravenous contrast was administered.  COMPARISON: Lumbar spine radiographs 11/26/2020  FINDINGS: Segmentation: Standard.  Alignment: Grade 1 anterolisthesis of L3 on L4.  Vertebrae: Chronic T12 and L4 compression fractures with mild-to-moderate vertebral body height loss, greater at T12. L1 compression fracture with moderate marrow edema and 80% height loss which has progressed from the prior radiographs. L2 compression fracture with 25% height loss and very mild edema along the superior and inferior endplates. L3 compression fracture with 20% height  loss and mild edema along the L3 superior endplate. No suspicious marrow lesion.  Conus medullaris and cauda equina: Conus extends to the T12-L1 level. Conus and cauda equina appear normal.  Paraspinal and other soft tissues: Partially visualized 2.6 cm cyst in the upper pole of the right kidney.  Disc levels:  Disc desiccation throughout the lumbar spine. Moderate to severe disc space narrowing at L5-S1.  T12-L1: Disc bulging, 6 mm retropulsion of the posterosuperior L1 vertebral body, and moderate facet and ligamentum flavum hypertrophy result in moderate spinal stenosis. Patent neural foramina.  L1-2: Disc bulging and moderate facet and ligamentum flavum hypertrophy result in borderline spinal and lateral recess stenosis. Patent neural foramina.  L2-3: Disc bulging and severe facet and ligamentum flavum hypertrophy result in mild spinal and lateral recess stenosis. Patent neural foramina.  L3-4: Anterolisthesis with bulging uncovered disc and severe facet and ligamentum flavum hypertrophy result in moderate spinal stenosis, moderate left lateral recess stenosis, and mild left greater than right neural foraminal stenosis.  L4-5: Disc bulging, moderate ligamentum flavum hypertrophy, and severe right and mild-to-moderate left facet hypertrophy result in mild-to-moderate right and mild left lateral recess stenosis and mild-to-moderate right neural foraminal stenosis without spinal stenosis.  L5-S1: Disc bulging and mild facet hypertrophy without stenosis.  IMPRESSION: 1. Subacute L1 compression fracture with progressive, 80% height loss. Associated retropulsion resulting in moderate spinal stenosis. 2. Mild L2 and L3 compression fractures with very mild edema suggesting late subacute fractures. 3. Chronic T12 and L4 compression fractures. 4. Moderate spinal stenosis at L3-4.   Electronically Signed By: Logan Bores M.D. On: 01/29/2021 20:13   On review of prior CT  from 05/24/2020, its evident that the L1 compression fracture is new.  Labs:  CBC: Recent Labs    06/01/20 0810 06/02/20 1100 06/08/20 0420 10/01/20 0942  WBC 20.4* 22.5* 23.0* 9.1  HGB 11.6* 12.4* 10.5* 11.2*  HCT 35.6* 38.0* 32.3* 35.5*  PLT 636* 629* 321 308    COAGS: No results for input(s): INR, APTT in the last 8760 hours.  BMP: Recent Labs    06/01/20 0810 06/02/20 1100 06/08/20 0420 10/01/20 0942 02/14/21 1031  NA 135 135 135 138 134  K 4.3 4.1 4.2 4.3 4.5  CL 98 97*  99 100 97  CO2 '29 29 31 28 23  '$ GLUCOSE 110* 191* 85 100* 105*  BUN 41* 43* 35* 31* 24  CALCIUM 8.0* 8.1* 7.7* 9.3 9.1  CREATININE 1.53* 1.53* 1.31* 1.63* 1.47*  GFRNONAA 43* 43* 52* 40*  --     LIVER FUNCTION TESTS: Recent Labs    05/30/20 0547 05/31/20 0630 06/01/20 0810 10/01/20 0942  BILITOT 0.5 0.3 0.5 0.5  AST '21 19 17 20  '$ ALT '25 22 20 15  '$ ALKPHOS 70 65 64 60  PROT 5.6* 5.3* 5.2* 6.8  ALBUMIN 2.3* 2.3* 2.3* 3.5    TUMOR MARKERS: No results for input(s): AFPTM, CEA, CA199, CHROMGRNA in the last 8760 hours.  Assessment and Plan:  My impression is that this patient has severe subacute lumbar L1 compression fracture deformity which likely contributes or accounts for most of the low back pain.  Based on cross-sectional imaging, this would be anatomically approachable for percutaneous intervention.  No associated severe spinal stenosis or other contraindications.  No suggestion of metastatic disease or other pathologic findings to indicate a need for concomitant core biopsy. Given the  lack of adequate symptom relief with time and a fairly aggressive pain medication regimen, and   limitations of activities of daily living, the patient  is clinically an appropriate candidate for consideration of vertebral augmentation. I discussed with the patient  the pathophysiology of vertebral compression fracture deformities; the stable nature of these which does not require emergent treatment;  natural history which includes healing over some unpredictable number of months.  We discussed treatment options including watchful waiting, surgical fixation, and percutaneous kyphoplasty/vertebroplasty.  We discussed in detail the percutaneous kyphoplasty technique, anticipated benefits, time course to symptom resolution, possible risks and side effects.  We discussed his elevated risk of additional level fractures with or without vertebral augmentation.   They seemed to understand, and did ask appropriate questions. The patient is motivated to proceed with treatment ASAP.  Accordingly, we schedule percutaneous lumbar L1  vertebroplasty/kyphoplasty under moderate sedation at [facility] or [facility] as an outpatient at the patient's convenience, pending carrier approval if needed.   Thank you for this interesting consult.  I greatly enjoyed meeting Max Cole and look forward to participating in their care.  A copy of this report was sent to the requesting provider on this date.  Electronically Signed: Rickard Rhymes 03/05/2021, 4:29 PM   I spent a total of  40 Minutes   in face to face in clinical consultation, greater than 50% of which was counseling/coordinating care for painful subacute lumbar L1 compression fracture deformity.

## 2021-03-10 ENCOUNTER — Other Ambulatory Visit: Payer: Medicare HMO

## 2021-03-12 DIAGNOSIS — J9621 Acute and chronic respiratory failure with hypoxia: Secondary | ICD-10-CM | POA: Diagnosis not present

## 2021-03-12 DIAGNOSIS — I5042 Chronic combined systolic (congestive) and diastolic (congestive) heart failure: Secondary | ICD-10-CM | POA: Diagnosis not present

## 2021-03-12 DIAGNOSIS — J9611 Chronic respiratory failure with hypoxia: Secondary | ICD-10-CM | POA: Diagnosis not present

## 2021-03-12 DIAGNOSIS — N1832 Chronic kidney disease, stage 3b: Secondary | ICD-10-CM | POA: Diagnosis not present

## 2021-03-15 ENCOUNTER — Telehealth: Payer: Self-pay | Admitting: Internal Medicine

## 2021-03-15 NOTE — Telephone Encounter (Signed)
Patient daughter Santiago Glad called back , she feels that she needs to get her dad seen before he is in AFIB for too long and its harder to get him out of it, would like to speak with nurse again .

## 2021-03-15 NOTE — Telephone Encounter (Signed)
I spoke with daughter. Father is home now and feels ok. She is going by to re-check his BP, HE. She will also speak with device clinic and ask them to interrogate his pacemaker. She reports that he had little to eat before gym this am and says he normally doesn't eat a large breakfast. His dizziness resolved quickly after he was helped from treadmill to sit in chair .    I will await her call back.

## 2021-03-15 NOTE — Telephone Encounter (Signed)
I spoke with daughter, Santiago Glad (who is a Immunologist) . She states her father feels ok,no further dizzy spells. She awaits new piece of equipment for home pacer remote.She reports his heart rates are not elevated but still occasionally irregular.She will await remote transmission tomorrow.

## 2021-03-15 NOTE — Telephone Encounter (Signed)
Returned phone call.   Spoke to patients daughter Santiago Glad), explained that since patient feels well now requested to send remote transmission in am then we can decide if need to send to AF clinic. Also, advised to assure patient stays hydrated and good food intake.  ED precautions discussed with verbal understanding. Appreciative of follow up call.

## 2021-03-15 NOTE — Telephone Encounter (Signed)
Patient went to the gym this morning, walked the treadmill and became dizzy.  Right now he is weak and is in A-Fib per daughter.  BP was 100/60, Heart rate was in the 90's.  Please advise.

## 2021-03-15 NOTE — Telephone Encounter (Signed)
I spoke with the patient daughter Lovell Sheehan (dpr) and asked her to send a manual transmission with the patient home remote monitor. She agreed to send the transmission. I told her as soon as I see the transmission I will have the device nurse to review it and give her a call back. Max Cole phone number is [phone]. His blood pressure is 106/61 bpm.  Max Cole states the nurse she spoke with wanted the patient blood pressure.

## 2021-03-16 ENCOUNTER — Telehealth (HOSPITAL_COMMUNITY): Payer: Self-pay

## 2021-03-16 NOTE — Telephone Encounter (Signed)
Appointment made for 9/21

## 2021-03-16 NOTE — Telephone Encounter (Signed)
Manual transmission received,  EGM's show AF with RVR, longest 65min 11sec, rate 188 Presenting rhythm AF with RVR, AS/BiV sense Eliquis, Coreg prescribed Phone call in EPIC yesterday states pt. asymptomatic, awaiting remote transmission.  Spoke with patient's daughter, Santiago Glad.  She reports pt is still sleeping now.  Advised since AF confirmed, pt symptomatic and experienced low BP it might be best to have consult with AF clinic.  She is agreeable.    Advised I would forward to Dr. Lovena Le and AF clinic expect phone call from them to set up consult.

## 2021-03-16 NOTE — Telephone Encounter (Signed)
Spoke to daughter Santiago Glad. Informed her that insurance request for KP is still pending. Will reach out to schedule once we receive authorization. She was fine with this plan. AW

## 2021-03-17 ENCOUNTER — Other Ambulatory Visit: Payer: Self-pay

## 2021-03-17 ENCOUNTER — Ambulatory Visit (HOSPITAL_COMMUNITY)
Admission: RE | Admit: 2021-03-17 | Discharge: 2021-03-17 | Disposition: A | Discharge status: Home / Self Care | Payer: Medicare HMO | Source: Ambulatory Visit | Attending: Nurse Practitioner | Admitting: Nurse Practitioner

## 2021-03-17 VITALS — BP 140/70 | HR 69 | Ht 72.0 in | Wt 165.6 lb

## 2021-03-17 DIAGNOSIS — Z7901 Long term (current) use of anticoagulants: Secondary | ICD-10-CM | POA: Diagnosis not present

## 2021-03-17 DIAGNOSIS — Z87891 Personal history of nicotine dependence: Secondary | ICD-10-CM | POA: Insufficient documentation

## 2021-03-17 DIAGNOSIS — Z8249 Family history of ischemic heart disease and other diseases of the circulatory system: Secondary | ICD-10-CM | POA: Diagnosis not present

## 2021-03-17 DIAGNOSIS — Z79899 Other long term (current) drug therapy: Secondary | ICD-10-CM | POA: Insufficient documentation

## 2021-03-17 DIAGNOSIS — D6869 Other thrombophilia: Secondary | ICD-10-CM | POA: Diagnosis not present

## 2021-03-17 DIAGNOSIS — I4891 Unspecified atrial fibrillation: Secondary | ICD-10-CM | POA: Insufficient documentation

## 2021-03-17 DIAGNOSIS — I480 Paroxysmal atrial fibrillation: Secondary | ICD-10-CM | POA: Diagnosis not present

## 2021-03-17 LAB — CBC
HCT: 35.8 % — ABNORMAL LOW (ref 39.0–52.0)
Hemoglobin: 11.7 g/dL — ABNORMAL LOW (ref 13.0–17.0)
MCH: 31.9 pg (ref 26.0–34.0)
MCHC: 32.7 g/dL (ref 30.0–36.0)
MCV: 97.5 fL (ref 80.0–100.0)
Platelets: 283 10*3/uL (ref 150–400)
RBC: 3.67 MIL/uL — ABNORMAL LOW (ref 4.22–5.81)
RDW: 13.2 % (ref 11.5–15.5)
WBC: 8.1 10*3/uL (ref 4.0–10.5)
nRBC: 0 % (ref 0.0–0.2)

## 2021-03-17 LAB — BASIC METABOLIC PANEL
Anion gap: 8 (ref 5–15)
BUN: 25 mg/dL — ABNORMAL HIGH (ref 8–23)
CO2: 30 mmol/L (ref 22–32)
Calcium: 8.9 mg/dL (ref 8.9–10.3)
Chloride: 100 mmol/L (ref 98–111)
Creatinine, Ser: 1.48 mg/dL — ABNORMAL HIGH (ref 0.61–1.24)
GFR, Estimated: 44 mL/min — ABNORMAL LOW (ref >60)
Glucose, Bld: 91 mg/dL (ref 70–99)
Potassium: 4.4 mmol/L (ref 3.5–5.1)
Sodium: 138 mmol/L (ref 135–145)

## 2021-03-17 LAB — MAGNESIUM: Magnesium: 2.3 mg/dL (ref 1.7–2.4)

## 2021-03-17 LAB — TSH: TSH: 1.518 u[IU]/mL (ref 0.350–4.500)

## 2021-03-17 MED ORDER — CARVEDILOL 6.25 MG PO TABS
6.2500 mg | ORAL_TABLET | Freq: Two times a day (BID) | ORAL | 0 refills | Status: DC
Start: 2021-03-17 — End: 2021-03-17

## 2021-03-17 MED ORDER — CARVEDILOL 6.25 MG PO TABS: 6.2500 mg | ORAL_TABLET | Freq: Two times a day (BID) | ORAL | 1 refills | Status: DC

## 2021-03-17 NOTE — Patient Instructions (Addendum)
May take an extra 1/2 tablet twice a day of coreg for breakthrough afib - for heart rate over 100 (as long as top number of blood pressure is over 100)

## 2021-03-18 ENCOUNTER — Encounter (HOSPITAL_COMMUNITY): Payer: Self-pay | Admitting: Nurse Practitioner

## 2021-03-18 NOTE — Progress Notes (Addendum)
Primary Care Physician: Asencion Noble, MD Referring Physician:   JAHMIR Cole is a 87 y.o. male with a h/o PPM, , history of chronic systolic heart failure, paroxysmal atrial fibrillation who was  fairly well controlled on amiodarone therapy.  Unfortunately he developed lung toxicity within 3 months of starting the [facility], this was stopped last fall, and has been treated with prednisone under the direction of Dr. Halford Chessman. He wears O 2 via nasal catheter, other when he is sitting.He denies palpitations. He has not had recurrent palps and he has started back exercising. He checks his pulse ox and he will drop into the mid 80's on the sat if not wearing his O2.   He is in the afib clinic as he had a spell of afib on Monday. He felt lightheaded. He went to the gym on Monday without his O2. He got dizzy on treadmill.He states that his sats have been so good sitting at home and short trips to the BR, that he has been trying to go more without it. Per his watch, he did not see HR's go over 100. His Bp ran lower that day. He felt better on Tuesday. Today, he is back in SR and feels back to baseline. He is in clinic with his O2. This is the first spell of afib since coming off amio last fall.   Today, he denies symptoms of palpitations, chest pain, shortness of breath, orthopnea, PND, lower extremity edema, dizziness, presyncope, syncope, or neurologic sequela. The patient is tolerating medications without difficulties and is otherwise without complaint today.   Past Medical History:  Diagnosis Date   Atrial fibrillation ([place])    Bradycardia    Cardiomyopathy 2005   a. Possibly alcoholic; b. cath in 7026-37% ostial D1, PCW of 12, EF of 35-40%;  c. EF of 0.25 in 11/2003;  d. 40-45% in 05/2004;  e. 25% in 10/2008 by echo;  f. 04/2015 Echo: EF 40-45%, diff HK, sev inflat/inf HK, Gr 1 DD, mild AI, triv TR.   Carotid artery occlusion    Cerebrovascular disease    Carotid ultrasound in 12/2010-60-79% left internal  carotid artery, less than 40% or right RICA, no change   Chronic kidney disease    Creatinine of 1.6 in 2009   Hyperlipidemia 10/15/2011   Lipid profile in 03/2009:231, 136, 56, 148   Left bundle branch block    Prostate carcinoma ([place])    Syncope    [place] CRT-P 07/03/15 Dr. Lovena Le   Past Surgical History:  Procedure Laterality Date   CATARACT EXTRACTION     Right   COLONOSCOPY N/A 08/16/2012   Procedure: COLONOSCOPY;  Surgeon: Rogene Houston, MD;  [facility];  Service: Endoscopy;  Laterality: N/A;  930   COLONOSCOPY N/A 10/21/2015   Procedure: COLONOSCOPY;  Surgeon: Rogene Houston, MD;  [facility];  Service: Endoscopy;  Laterality: N/A;  1200   COLONOSCOPY W/ POLYPECTOMY  2010   Diverticulosis   EP IMPLANTABLE DEVICE N/A 07/03/2015   Procedure: BiV Pacemaker Insertion CRT-P;  Surgeon: Evans Lance, MD;  Location: [facility];  Service: Cardiovascular;  Laterality: N/A;   EYE SURGERY Right    Cataract   HERNIA REPAIR      Current Outpatient Medications  Medication Sig Dispense Refill   acetaminophen (TYLENOL) 500 MG tablet Take 500 mg by mouth every 6 (six) hours as needed.     apixaban (ELIQUIS) 2.5 MG TABS tablet Take 1 tablet (  2.5 mg total) by mouth 2 (two) times daily. 60 tablet 0   ferrous sulfate 325 (65 FE) MG tablet Take 1 tablet (325 mg total) by mouth daily with breakfast. 30 tablet 0   furosemide (LASIX) 20 MG tablet Take 1 tablet (20 mg total) by mouth daily. 30 tablet 0   methocarbamol (ROBAXIN) 500 MG tablet Take 1 tablet (500 mg total) by mouth 3 (three) times daily. 90 tablet 0   NON FORMULARY Diet: ____ Regular, ___x___ NAS, _______Consistent Carbohydrate, _______NPO _____Other     OXYGEN Inhale 4 L into the lungs continuous.     pravastatin (PRAVACHOL) 20 MG tablet Take 1 tablet (20 mg total) by mouth every evening. 30 tablet 0   carvedilol (COREG) 6.25 MG tablet Take 1 tablet (6.25 mg total) by mouth 2 (two) times daily with a  meal. May take an extra 1/2 tablet for breakthrough afib 75 tablet 1   No current facility-administered medications for this encounter.    No Known Allergies  Social History   Socioeconomic History   Marital status: Married    Spouse name: Not on file   Number of children: Not on file   Years of education: Not on file   Highest education level: Not on file  Occupational History   Not on file  Tobacco Use   Smoking status: Former    Packs/day: 1.00    Years: 30.00    Pack years: 30.00    Types: Cigarettes    Quit date: 09/28/1973    Years since quitting: 47.5   Smokeless tobacco: Never  Vaping Use   Vaping Use: Never used  Substance and Sexual Activity   Alcohol use: Yes    Alcohol/week: 5.0 standard drinks    Types: 5 Glasses of wine per week    Comment: History of excessive alcohol use; Alternates between glass of wine vs. Liquor drink 5 days per week (04/2015)   Drug use: No   Sexual activity: Not on file  Other Topics Concern   Not on file  Social History Narrative   Not on file   Social Determinants of Health   Financial Resource Strain: Not on file  Food Insecurity: Not on file  Transportation Needs: Not on file  Physical Activity: Not on file  Stress: Not on file  Social Connections: Not on file  Intimate Partner Violence: Not on file    Family History  Problem Relation Age of Onset   Hypertension Mother     ROS- All systems are reviewed and negative except as per the HPI above  Physical Exam: Vitals:   03/17/21 1348  BP: 140/70  Pulse: 69  Weight: 75.1 kg  Height: 6' (1.829 m)   Wt Readings from Last 3 Encounters:  03/17/21 75.1 kg  12/03/20 77.6 kg  12/03/20 79.4 kg    Labs: Lab Results  Component Value Date   NA 138 03/17/2021   K 4.4 03/17/2021   CL 100 03/17/2021   CO2 30 03/17/2021   GLUCOSE 91 03/17/2021   BUN 25 (H) 03/17/2021   CREATININE 1.48 (H) 03/17/2021   CALCIUM 8.9 03/17/2021   MG 2.3 03/17/2021   No results  found for: INR Lab Results  Component Value Date   CHOL 205 02/26/2009   HDL 48 02/26/2009   LDLCALC 144 02/26/2009   TRIG 67 02/26/2009     GEN- The patient is well appearing, alert and oriented x 3 today.   Head- normocephalic, atraumatic Eyes-  Sclera  clear, conjunctiva pink Ears- hearing intact Oropharynx- clear Neck- supple, no JVP Lymph- no cervical lymphadenopathy Lungs- Clear to ausculation bilaterally, normal work of breathing Heart- Regular rate and rhythm, no murmurs, rubs or gallops, PMI not laterally displaced GI- soft, NT, ND, + BS Extremities- no clubbing, cyanosis, or edema MS- no significant deformity or atrophy Skin- no rash or lesion Psych- euthymic mood, full affect Neuro- strength and sensation are intact  EKG-Vent. rate 69 BPM PR interval 108 ms QRS duration 152 ms QT/QTcB 454/486 ms P-R-T axes 60 -80 87 Atrial-sensed ventricular-paced rhythm Biventricular pacemaker detected Abnormal ECG     Assessment and Plan:  1. Afib Has been quiet since last fall when amio was stopped due to lung toxicity Has several hours of afib on Monday Trigger may have been going to gym without his O2. Developed while on the treadmill He is aware that he should be wearing O2 when active His PO is in normal range now with O2 and he is back to baseline/SR since Tuesday We discussed how to manage afib if another episode IF his systolic  BP/HR are above 100, he can take an extra half of carvedilol I will discuss with Dr. Lovena Le if afib becomes persistent,what next step would be He may be able to use tikosyn, but his current kidney function would only allow 125 mcg bid  Triggers for afib discussed  Continue eliquis 5 mg bid for CHA2DS2VASc score of at least 3 and carvedilol 6.125 mg bid   Will order bmet/cbc/tsh/mag today  Afib clinic as needed   Butch Penny C. Max Cole, [facility] [address] [place], [place] [address] [phone]

## 2021-04-11 DIAGNOSIS — N1832 Chronic kidney disease, stage 3b: Secondary | ICD-10-CM | POA: Diagnosis not present

## 2021-04-11 DIAGNOSIS — I5042 Chronic combined systolic (congestive) and diastolic (congestive) heart failure: Secondary | ICD-10-CM | POA: Diagnosis not present

## 2021-04-11 DIAGNOSIS — J9621 Acute and chronic respiratory failure with hypoxia: Secondary | ICD-10-CM | POA: Diagnosis not present

## 2021-04-11 DIAGNOSIS — J9611 Chronic respiratory failure with hypoxia: Secondary | ICD-10-CM | POA: Diagnosis not present

## 2021-04-13 ENCOUNTER — Telehealth (HOSPITAL_COMMUNITY): Payer: Self-pay

## 2021-04-13 NOTE — Telephone Encounter (Signed)
Returned call to daughter, no answer, left vm. AW

## 2021-04-15 ENCOUNTER — Telehealth (HOSPITAL_COMMUNITY): Payer: Self-pay

## 2021-04-15 NOTE — Telephone Encounter (Signed)
Called pt's daughter back. Insurance has denied the KP even after the appeal was done. Daughter has decided to send the pt to pain management for now to see if that helps. AW

## 2021-04-20 ENCOUNTER — Encounter: Payer: Self-pay | Admitting: Internal Medicine

## 2021-04-20 ENCOUNTER — Other Ambulatory Visit: Payer: Self-pay

## 2021-04-20 ENCOUNTER — Ambulatory Visit: Payer: Medicare HMO | Admitting: Internal Medicine

## 2021-04-20 VITALS — BP 95/46 | HR 76 | Ht 72.0 in | Wt 168.0 lb

## 2021-04-20 DIAGNOSIS — I428 Other cardiomyopathies: Secondary | ICD-10-CM

## 2021-04-20 DIAGNOSIS — I5022 Chronic systolic (congestive) heart failure: Secondary | ICD-10-CM

## 2021-04-20 DIAGNOSIS — I480 Paroxysmal atrial fibrillation: Secondary | ICD-10-CM

## 2021-04-20 NOTE — Progress Notes (Signed)
HPI [name] returns for ongoing followup. He is a pleasant 87 year old man with a history of chronic systolic heart failure, paroxysmal atrial fibrillation who is been fairly well controlled on amiodarone therapy.  Unfortunately he developed lung toxicity within 3 months of starting the [facility] and has been treated with prednisone under the direction of Dr. Halford Chessman. He denies palpitations. He has not had recurrent palps and he has started back exercising. He checks his pulse ox and he will drop into the mid 80's but he has now gotten off of the steroid and his activity has normalized. He c/o back pain.  No Known Allergies   Current Outpatient Medications  Medication Sig Dispense Refill   apixaban (ELIQUIS) 2.5 MG TABS tablet Take 1 tablet (2.5 mg total) by mouth 2 (two) times daily. 60 tablet 0   carvedilol (COREG) 6.25 MG tablet Take 1 tablet (6.25 mg total) by mouth 2 (two) times daily with a meal. May take an extra 1/2 tablet for breakthrough afib 75 tablet 1   ferrous sulfate 325 (65 FE) MG tablet Take 1 tablet (325 mg total) by mouth daily with breakfast. 30 tablet 0   furosemide (LASIX) 20 MG tablet Take 1 tablet (20 mg total) by mouth daily. 30 tablet 0   NON FORMULARY Diet: ____ Regular, ___x___ NAS, _______Consistent Carbohydrate, _______NPO _____Other     OXYGEN Inhale 2 L into the lungs continuous.     pravastatin (PRAVACHOL) 20 MG tablet Take 1 tablet (20 mg total) by mouth every evening. 30 tablet 0   No current facility-administered medications for this visit.     Past Medical History:  Diagnosis Date   Atrial fibrillation ([place])    Bradycardia    Cardiomyopathy 2005   a. Possibly alcoholic; b. cath in 0160-10% ostial D1, PCW of 12, EF of 35-40%;  c. EF of 0.25 in 11/2003;  d. 40-45% in 05/2004;  e. 25% in 10/2008 by echo;  f. 04/2015 Echo: EF 40-45%, diff HK, sev inflat/inf HK, Gr 1 DD, mild AI, triv TR.   Carotid artery occlusion    Cerebrovascular disease     Carotid ultrasound in 12/2010-60-79% left internal carotid artery, less than 40% or right RICA, no change   Chronic kidney disease    Creatinine of 1.6 in 2009   Hyperlipidemia 10/15/2011   Lipid profile in 03/2009:231, 136, 56, 148   Left bundle branch block    Prostate carcinoma ([place])    Syncope    [place] CRT-P 07/03/15 Dr. Lovena Le    ROS:   All systems reviewed and negative except as noted in the HPI.   Past Surgical History:  Procedure Laterality Date   CATARACT EXTRACTION     Right   COLONOSCOPY N/A 08/16/2012   Procedure: COLONOSCOPY;  Surgeon: Rogene Houston, MD;  [facility];  Service: Endoscopy;  Laterality: N/A;  930   COLONOSCOPY N/A 10/21/2015   Procedure: COLONOSCOPY;  Surgeon: Rogene Houston, MD;  [facility];  Service: Endoscopy;  Laterality: N/A;  1200   COLONOSCOPY W/ POLYPECTOMY  2010   Diverticulosis   EP IMPLANTABLE DEVICE N/A 07/03/2015   Procedure: BiV Pacemaker Insertion CRT-P;  Surgeon: Evans Lance, MD;  Location: [facility];  Service: Cardiovascular;  Laterality: N/A;   EYE SURGERY Right    Cataract   HERNIA REPAIR       Family History  Problem Relation Age of Onset   Hypertension Mother  Social History   Socioeconomic History   Marital status: Married    Spouse name: Not on file   Number of children: Not on file   Years of education: Not on file   Highest education level: Not on file  Occupational History   Not on file  Tobacco Use   Smoking status: Former    Packs/day: 1.00    Years: 30.00    Pack years: 30.00    Types: Cigarettes    Quit date: 09/28/1973    Years since quitting: 47.5   Smokeless tobacco: Never  Vaping Use   Vaping Use: Never used  Substance and Sexual Activity   Alcohol use: Yes    Alcohol/week: 5.0 standard drinks    Types: 5 Glasses of wine per week    Comment: History of excessive alcohol use; Alternates between glass of wine vs. Liquor drink 5 days per week (04/2015)   Drug  use: No   Sexual activity: Not on file  Other Topics Concern   Not on file  Social History Narrative   Not on file   Social Determinants of Health   Financial Resource Strain: Not on file  Food Insecurity: Not on file  Transportation Needs: Not on file  Physical Activity: Not on file  Stress: Not on file  Social Connections: Not on file  Intimate Partner Violence: Not on file     Ht 6' (1.829 m)   Wt 168 lb (76.2 kg)   BMI 22.78 kg/m   Physical Exam:  Well appearing NAD HEENT: Unremarkable Neck:  No JVD, no thyromegally Lymphatics:  No adenopathy Back:  No CVA tenderness Lungs:  Clear HEART:  Regular rate rhythm, no murmurs, no rubs, no clicks Abd:  soft, positive bowel sounds, no organomegally, no rebound, no guarding Ext:  2 plus pulses, no edema, no cyanosis, no clubbing Skin:  No rashes no nodules Neuro:  CN II through XII intact, motor grossly intact   DEVICE  Normal device function.  See PaceArt for details.   Assess/Plan:  1. Atrial fib -he is maintaining NSR. He will undergo watchful waiting and when he goes back to atrial fib we will consider AV node ablation.  2. Chronic systolic heart failure - he has class 2 symptoms.  3. amio lung tox - he is doing well. No chest pain. Dyspnea is improved. He is off of the steroids. 4. PPM - his [place] [facility] b[facility] ppm is working normally.   Max Overlie Nazir Hacker,MD

## 2021-04-20 NOTE — Patient Instructions (Signed)
Medication Instructions:  Your physician recommends that you continue on your current medications as directed. Please refer to the Current Medication list given to you today.  *If you need a refill on your cardiac medications before your next appointment, please call your pharmacy*   Lab Work: NONE   If you have labs (blood work) drawn today and your tests are completely normal, you will receive your results only by: MyChart Message (if you have MyChart) OR A paper copy in the mail If you have any lab test that is abnormal or we need to change your treatment, we will call you to review the results.   Testing/Procedures: NONE    Follow-Up: At [facility], you and your health needs are our priority.  As part of our continuing mission to provide you with exceptional heart care, we have created designated Provider Care Teams.  These Care Teams include your primary Cardiologist (physician) and Advanced Practice Providers (APPs -  Physician Assistants and Nurse Practitioners) who all work together to provide you with the care you need, when you need it.  We recommend signing up for the patient portal called "MyChart".  Sign up information is provided on this After Visit Summary.  MyChart is used to connect with patients for Virtual Visits (Telemedicine).  Patients are able to view lab/test results, encounter notes, upcoming appointments, etc.  Non-urgent messages can be sent to your provider as well.   To learn more about what you can do with MyChart, go to [email or web address].    Your next appointment:   6 month(s)  The format for your next appointment:   In Person  Provider:   Gregg Taylor, MD   Other Instructions Thank you for choosing [facility]!    

## 2021-04-21 DIAGNOSIS — I480 Paroxysmal atrial fibrillation: Secondary | ICD-10-CM | POA: Diagnosis not present

## 2021-04-21 DIAGNOSIS — I5022 Chronic systolic (congestive) heart failure: Secondary | ICD-10-CM | POA: Diagnosis not present

## 2021-04-21 DIAGNOSIS — Z125 Encounter for screening for malignant neoplasm of prostate: Secondary | ICD-10-CM | POA: Diagnosis not present

## 2021-04-21 DIAGNOSIS — Z79899 Other long term (current) drug therapy: Secondary | ICD-10-CM | POA: Diagnosis not present

## 2021-04-21 DIAGNOSIS — N1832 Chronic kidney disease, stage 3b: Secondary | ICD-10-CM | POA: Diagnosis not present

## 2021-04-26 DIAGNOSIS — J449 Chronic obstructive pulmonary disease, unspecified: Secondary | ICD-10-CM | POA: Diagnosis not present

## 2021-04-26 DIAGNOSIS — N1832 Chronic kidney disease, stage 3b: Secondary | ICD-10-CM | POA: Diagnosis not present

## 2021-04-26 DIAGNOSIS — S32019A Unspecified fracture of first lumbar vertebra, initial encounter for closed fracture: Secondary | ICD-10-CM | POA: Diagnosis not present

## 2021-04-26 DIAGNOSIS — Z23 Encounter for immunization: Secondary | ICD-10-CM | POA: Diagnosis not present

## 2021-04-26 DIAGNOSIS — E785 Hyperlipidemia, unspecified: Secondary | ICD-10-CM | POA: Diagnosis not present

## 2021-04-26 DIAGNOSIS — C61 Malignant neoplasm of prostate: Secondary | ICD-10-CM | POA: Diagnosis not present

## 2021-04-27 DIAGNOSIS — M5416 Radiculopathy, lumbar region: Secondary | ICD-10-CM | POA: Diagnosis not present

## 2021-04-28 ENCOUNTER — Telehealth: Payer: Self-pay | Admitting: Physician Assistant

## 2021-04-28 ENCOUNTER — Telehealth: Payer: Self-pay | Admitting: *Deleted

## 2021-04-28 NOTE — Telephone Encounter (Signed)
Pt returning phone call... please advise.  Callback # [phone]

## 2021-04-28 NOTE — Telephone Encounter (Signed)
Patient with diagnosis of atrial fibrillation on Eliquis for anticoagulation.    Procedure: ESI L4-L5 Date of procedure: 05/18/21   CHA2DS2-VASc Score = 4   This indicates a 4.8% annual risk of stroke. The patient's score is based upon: CHF History: 1 HTN History: 0 Diabetes History: 0 Stroke History: 0 Vascular Disease History: 1 Age Score: 2 Gender Score: 0    CrCl 35 Platelet count 283  Per office protocol, patient can hold Eliquis for 3 days prior to procedure.   Patient will not need bridging with Lovenox (enoxaparin) around procedure.

## 2021-04-28 NOTE — Telephone Encounter (Signed)
   Pre-operative Risk Assessment    Patient Name: Max Cole  DOB: [birth year] MRN: [id number]      Request for Surgical Clearance   Procedure:   ESI L4-L5  Date of Surgery: Clearance 05/18/21                                 Surgeon:  DR. DAVE [facility] Surgeon's Group or Practice Name:  [facility] Phone number:  [phone] Fax number:  [phone]   Type of Clearance Requested: - Medical  - Pharmacy:  Hold Apixaban (Eliquis) 3 DAYS PRIOR AND RESUME S/P PROCEDURE    Type of Anesthesia:   Not Indicated   Additional requests/questions:   [name]   04/28/2021, 9:06 AM

## 2021-04-30 NOTE — Telephone Encounter (Signed)
   Name: Max Cole  DOB: 05/26/1928  MRN: [id number]   Primary Cardiologist: Cristopher Peru, MD  Chart reviewed as part of pre-operative protocol coverage. Patient was contacted 04/30/2021 in reference to pre-operative risk assessment for pending surgery as outlined below.  Max Cole was last seen on 04/20/21 by Dr. Lovena Le.  Since that day, Max Cole has done fine from a cardiac standpoint.  Despite having back pain he is able to complete 4 METS without anginal complaints.  Spinal injections are low risk overall.  Therefore, based on ACC/AHA guidelines, the patient would be at acceptable risk for the planned procedure without further cardiovascular testing.   The patient was advised that if he develops new symptoms prior to surgery to contact our office to arrange for a follow-up visit, and he verbalized understanding.  Per pharmacy recommendation, patient can hold Eliquis 3 days prior to his upcoming procedure with plans to restart as soon as he is cleared to do so by his surgeon.  I will route this recommendation to the requesting party via Epic fax function and remove from pre-op pool. Please call with questions.  Abigail Butts, PA-C 04/30/2021, 1:33 PM

## 2021-05-03 ENCOUNTER — Ambulatory Visit (INDEPENDENT_AMBULATORY_CARE_PROVIDER_SITE_OTHER): Payer: Medicare HMO

## 2021-05-03 DIAGNOSIS — I428 Other cardiomyopathies: Secondary | ICD-10-CM

## 2021-05-04 LAB — CUP PACEART REMOTE DEVICE CHECK
Battery Remaining Longevity: 66 mo
Battery Remaining Percentage: 74 %
Brady Statistic RA Percent Paced: 20 %
Brady Statistic RV Percent Paced: 94 %
Date Time Interrogation Session: 20221107155500
Implantable Lead Implant Date: 20170106
Implantable Lead Implant Date: 20170106
Implantable Lead Implant Date: 20170106
Implantable Lead Location: 753858
Implantable Lead Location: 753859
Implantable Lead Location: 753860
Implantable Lead Model: 4677
Implantable Lead Model: 7741
Implantable Lead Model: 7742
Implantable Lead Serial Number: 507382
Implantable Lead Serial Number: 695539
Implantable Lead Serial Number: 724660
Implantable Pulse Generator Implant Date: 20170106
Lead Channel Impedance Value: 615 Ohm
Lead Channel Impedance Value: 640 Ohm
Lead Channel Impedance Value: 743 Ohm
Lead Channel Pacing Threshold Amplitude: 0.9 V
Lead Channel Pacing Threshold Amplitude: 1 V
Lead Channel Pacing Threshold Pulse Width: 0.4 ms
Lead Channel Pacing Threshold Pulse Width: 0.8 ms
Lead Channel Setting Pacing Amplitude: 2 V
Lead Channel Setting Pacing Amplitude: 2 V
Lead Channel Setting Pacing Amplitude: 2.2 V
Lead Channel Setting Pacing Pulse Width: 0.4 ms
Lead Channel Setting Pacing Pulse Width: 0.8 ms
Lead Channel Setting Sensing Sensitivity: 2.5 mV
Lead Channel Setting Sensing Sensitivity: 2.5 mV
Pulse Gen Serial Number: 714587

## 2021-05-06 NOTE — Progress Notes (Signed)
Remote pacemaker transmission.   

## 2021-05-18 DIAGNOSIS — M5416 Radiculopathy, lumbar region: Secondary | ICD-10-CM | POA: Diagnosis not present

## 2021-07-07 DIAGNOSIS — M25551 Pain in right hip: Secondary | ICD-10-CM | POA: Diagnosis not present

## 2021-07-19 ENCOUNTER — Encounter: Payer: Self-pay | Admitting: Pulmonary Disease

## 2021-07-19 ENCOUNTER — Ambulatory Visit: Payer: Medicare HMO | Admitting: Pulmonary Disease

## 2021-07-19 ENCOUNTER — Other Ambulatory Visit: Payer: Self-pay

## 2021-07-19 VITALS — BP 122/62 | HR 74 | Temp 98.0°F | Ht 73.0 in | Wt 170.0 lb

## 2021-07-19 DIAGNOSIS — J9611 Chronic respiratory failure with hypoxia: Secondary | ICD-10-CM | POA: Diagnosis not present

## 2021-07-19 DIAGNOSIS — J984 Other disorders of lung: Secondary | ICD-10-CM | POA: Diagnosis not present

## 2021-07-19 NOTE — Patient Instructions (Signed)
Will arrange for overnight oxygen test on room air  Follow up in 6 months

## 2021-07-19 NOTE — Progress Notes (Signed)
[facility]  Chief Complaint  Patient presents with   Follow-up    Patient is still feeling well.   Patient is using to 2LO2 cont and pulse    Constitutional:  BP 122/62 (BP Location: Left Arm, Patient Position: Sitting)    Pulse 74    Temp 98 F (36.7 C) (Temporal)    Ht 6\' 1"  (1.854 m)    Wt 170 lb (77.1 kg)    SpO2 98% Comment: 2LO2 pulse   BMI 22.43 kg/m   Past Medical History:  Systolic CHF status post BiV PPM, CKD 3a, Carotid stenosis, HLD, PAF, HTN  Past Surgical History:  He  has a past surgical history that includes Cataract extraction; Colonoscopy w/ polypectomy (2010); Colonoscopy (N/A, 08/16/2012); Eye surgery (Right); Cardiac catheterization (N/A, 07/03/2015); Hernia repair; and Colonoscopy (N/A, 10/21/2015).  Brief Summary:  Max Cole is a 88 y.o. 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      Subjective:   He is here with his daughter.  Breathing okay.  He can take his oxygen off when sitting.  He is wondering if he needs to continue using oxygen at night.  Still uses with activity.  Uses 2 liters pulsed when out on POC, but 2 liters continuous when at home.  He has a couple of pulse oximeters, and sometimes the numbers read differently.  Physical Exam:   Appearance - well kempt, wearing oxygen  ENMT - no sinus tenderness, no oral exudate, no LAN, Mallampati 3 airway, no stridor  Respiratory - equal breath sounds bilaterally, no wheezing or rales  CV - s1s2 regular rate and rhythm, no murmurs  Ext - no clubbing, no edema  Skin - no rashes  Psych - normal mood and affect     Pulmonary testing:  ANA 05/24/20 >> negative A1AT 06/01/20 >> 159  Chest Imaging:  CT angio chest 05/22/20 >> widespread, symmetric, peripheral GGO  Cardiac Tests:  Echo 02/07/20 >> EF 50 to 55%, grade 1 DD, mild elevation in PASP, severe LA dilation, mod RA dilation  Social History:   He  reports that he quit smoking about 47 years ago. His smoking use included cigarettes. He has a 30.00 pack-year smoking history. He has never used smokeless tobacco. He reports current alcohol use of about 5.0 standard drinks per week. He reports that he does not use drugs.  Family History:  His family history includes Hypertension in his mother.     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 - has been off prednisone since January 2022 - goal SpO2 > 90% - uses 2 liters  - will arrange for ONO on room air to determine if he still needs to use supplemental oxygen at night - discussed limitations of pulse oximeters, and advised him to try using on different fingers/hands if numbers don't line up  Atrial fibrillation, chronic systolic CHF. - followed by Dr. Cristopher Peru with [facility]  Goals of care. - DNR/DNI  Time Spent Involved in Patient Care on Day of Examination:  28 minutes  Follow up:   Patient Instructions  Will arrange for overnight oxygen test on room air  Follow up in 6 months  Medication List:   Allergies as of 07/19/2021   No Known Allergies      Medication List        Accurate as of July 19, 2021 12:51 PM. If you have  any questions, ask your nurse or doctor.          STOP taking these medications    NON FORMULARY Stopped by: Chesley Mires, MD       TAKE these medications    apixaban 2.5 MG Tabs tablet Commonly known as: ELIQUIS Take 1 tablet (2.5 mg total) by mouth 2 (two) times daily.   carvedilol 6.25 MG tablet Commonly known as: COREG Take 1 tablet (6.25 mg total) by mouth 2 (two) times daily with a meal. May take an extra 1/2 tablet for breakthrough afib   ferrous sulfate 325 (65 FE) MG tablet Take 1 tablet (325 mg total) by mouth daily with breakfast.   furosemide 20 MG tablet Commonly known as: LASIX Take 1 tablet (20 mg total) by mouth daily.    HYDROcodone-acetaminophen 5-325 MG tablet Commonly known as: NORCO/VICODIN Take 1 tablet by mouth 2 (two) times daily. 1/2 tab bid   OXYGEN Inhale 2 L into the lungs continuous.   pravastatin 20 MG tablet Commonly known as: PRAVACHOL Take 1 tablet (20 mg total) by mouth every evening.   ROBAXIN PO Take by mouth. 1/2 tab in am and 1/2 tab in pm        Signature:  Chesley Mires, MD [place] Pager - [phone] 07/19/2021, 12:51 PM

## 2021-07-26 DIAGNOSIS — N1832 Chronic kidney disease, stage 3b: Secondary | ICD-10-CM | POA: Diagnosis not present

## 2021-07-26 DIAGNOSIS — I480 Paroxysmal atrial fibrillation: Secondary | ICD-10-CM | POA: Diagnosis not present

## 2021-07-26 DIAGNOSIS — Z79899 Other long term (current) drug therapy: Secondary | ICD-10-CM | POA: Diagnosis not present

## 2021-07-26 DIAGNOSIS — I5022 Chronic systolic (congestive) heart failure: Secondary | ICD-10-CM | POA: Diagnosis not present

## 2021-07-28 DIAGNOSIS — I480 Paroxysmal atrial fibrillation: Secondary | ICD-10-CM | POA: Diagnosis not present

## 2021-07-28 DIAGNOSIS — N1832 Chronic kidney disease, stage 3b: Secondary | ICD-10-CM | POA: Diagnosis not present

## 2021-07-28 DIAGNOSIS — I5022 Chronic systolic (congestive) heart failure: Secondary | ICD-10-CM | POA: Diagnosis not present

## 2021-07-28 DIAGNOSIS — J182 Hypostatic pneumonia, unspecified organism: Secondary | ICD-10-CM | POA: Diagnosis not present

## 2021-08-02 ENCOUNTER — Ambulatory Visit (INDEPENDENT_AMBULATORY_CARE_PROVIDER_SITE_OTHER): Payer: Medicare HMO

## 2021-08-02 DIAGNOSIS — I428 Other cardiomyopathies: Secondary | ICD-10-CM

## 2021-08-03 LAB — CUP PACEART REMOTE DEVICE CHECK
Battery Remaining Longevity: 66 mo
Battery Remaining Percentage: 71 %
Brady Statistic RA Percent Paced: 13 %
Brady Statistic RV Percent Paced: 96 %
Date Time Interrogation Session: 20230207142700
Implantable Lead Implant Date: 20170106
Implantable Lead Implant Date: 20170106
Implantable Lead Implant Date: 20170106
Implantable Lead Location: 753858
Implantable Lead Location: 753859
Implantable Lead Location: 753860
Implantable Lead Model: 4677
Implantable Lead Model: 7741
Implantable Lead Model: 7742
Implantable Lead Serial Number: 507382
Implantable Lead Serial Number: 695539
Implantable Lead Serial Number: 724660
Implantable Pulse Generator Implant Date: 20170106
Lead Channel Impedance Value: 568 Ohm
Lead Channel Impedance Value: 622 Ohm
Lead Channel Impedance Value: 666 Ohm
Lead Channel Pacing Threshold Amplitude: 0.9 V
Lead Channel Pacing Threshold Amplitude: 1 V
Lead Channel Pacing Threshold Pulse Width: 0.4 ms
Lead Channel Pacing Threshold Pulse Width: 0.8 ms
Lead Channel Setting Pacing Amplitude: 2 V
Lead Channel Setting Pacing Amplitude: 2 V
Lead Channel Setting Pacing Amplitude: 2.2 V
Lead Channel Setting Pacing Pulse Width: 0.4 ms
Lead Channel Setting Pacing Pulse Width: 0.8 ms
Lead Channel Setting Sensing Sensitivity: 2.5 mV
Lead Channel Setting Sensing Sensitivity: 2.5 mV
Pulse Gen Serial Number: 714587

## 2021-08-04 NOTE — Progress Notes (Signed)
Remote pacemaker transmission.   

## 2021-08-05 DIAGNOSIS — M25551 Pain in right hip: Secondary | ICD-10-CM | POA: Diagnosis not present

## 2021-08-26 ENCOUNTER — Telehealth: Payer: Self-pay | Admitting: Pulmonary Disease

## 2021-08-26 NOTE — Telephone Encounter (Signed)
Max Cole patients daughter states that he is having to stay on 5L at all times now to keep his oxygen sat 92% to 94%. Before patient is only walking a little bit and his oxygen dropped to 86% when he was on 2L. Patient does have a slight cough. No fever noted. Mucus is clear. Both daughters tested positive for covid.  ? ?Please advise Dr Halford Chessman  ?

## 2021-08-26 NOTE — Telephone Encounter (Signed)
He needs to be get tested for COVID and schedule a video visit with me or one of the NPs tomorrow. ?

## 2021-08-27 NOTE — Telephone Encounter (Signed)
Called and spoke with pt's sister Santiago Glad letting her know the info stated by VS. She verbalized understanding. Stated that she would see if her sister could go to pt's house to do another covid test. She stated that they did a covid test Tues. 2/28 which came back negative but said they would go do another one to make sure that also came back negative. Santiago Glad stated that pt is now able to use 2L O2 without any problems. She said she would call us back next week if she thought they needed to schedule a virtual visit or would take pt to UC if needed. Nothing further needed. ?

## 2021-09-20 DIAGNOSIS — L570 Actinic keratosis: Secondary | ICD-10-CM | POA: Diagnosis not present

## 2021-09-20 DIAGNOSIS — C44629 Squamous cell carcinoma of skin of left upper limb, including shoulder: Secondary | ICD-10-CM | POA: Diagnosis not present

## 2021-09-20 DIAGNOSIS — X32XXXA Exposure to sunlight, initial encounter: Secondary | ICD-10-CM | POA: Diagnosis not present

## 2021-09-20 DIAGNOSIS — L821 Other seborrheic keratosis: Secondary | ICD-10-CM | POA: Diagnosis not present

## 2021-09-20 DIAGNOSIS — B078 Other viral warts: Secondary | ICD-10-CM | POA: Diagnosis not present

## 2021-09-21 ENCOUNTER — Encounter (HOSPITAL_COMMUNITY): Payer: Self-pay | Admitting: Emergency Medicine

## 2021-09-21 ENCOUNTER — Other Ambulatory Visit: Payer: Self-pay

## 2021-09-21 ENCOUNTER — Emergency Department (HOSPITAL_COMMUNITY)
Admission: EM | Admit: 2021-09-21 | Discharge: 2021-09-21 | Disposition: A | Discharge status: Home / Self Care | Payer: Medicare HMO | Attending: Emergency Medicine | Admitting: Emergency Medicine

## 2021-09-21 DIAGNOSIS — Z8546 Personal history of malignant neoplasm of prostate: Secondary | ICD-10-CM | POA: Diagnosis not present

## 2021-09-21 DIAGNOSIS — I4891 Unspecified atrial fibrillation: Secondary | ICD-10-CM | POA: Insufficient documentation

## 2021-09-21 DIAGNOSIS — Z4801 Encounter for change or removal of surgical wound dressing: Secondary | ICD-10-CM | POA: Diagnosis not present

## 2021-09-21 DIAGNOSIS — N189 Chronic kidney disease, unspecified: Secondary | ICD-10-CM | POA: Insufficient documentation

## 2021-09-21 DIAGNOSIS — L7622 Postprocedural hemorrhage and hematoma of skin and subcutaneous tissue following other procedure: Secondary | ICD-10-CM | POA: Insufficient documentation

## 2021-09-21 DIAGNOSIS — Z7901 Long term (current) use of anticoagulants: Secondary | ICD-10-CM | POA: Insufficient documentation

## 2021-09-21 DIAGNOSIS — Z09 Encounter for follow-up examination after completed treatment for conditions other than malignant neoplasm: Secondary | ICD-10-CM

## 2021-09-21 LAB — CBC WITH DIFFERENTIAL/PLATELET
Abs Immature Granulocytes: 0.09 10*3/uL — ABNORMAL HIGH (ref 0.00–0.07)
Basophils Absolute: 0.1 10*3/uL (ref 0.0–0.1)
Basophils Relative: 1 %
Eosinophils Absolute: 0.4 10*3/uL (ref 0.0–0.5)
Eosinophils Relative: 4 %
HCT: 31.6 % — ABNORMAL LOW (ref 39.0–52.0)
Hemoglobin: 10.2 g/dL — ABNORMAL LOW (ref 13.0–17.0)
Immature Granulocytes: 1 %
Lymphocytes Relative: 18 %
Lymphs Abs: 1.6 10*3/uL (ref 0.7–4.0)
MCH: 32 pg (ref 26.0–34.0)
MCHC: 32.3 g/dL (ref 30.0–36.0)
MCV: 99.1 fL (ref 80.0–100.0)
Monocytes Absolute: 1.2 10*3/uL — ABNORMAL HIGH (ref 0.1–1.0)
Monocytes Relative: 13 %
Neutro Abs: 5.8 10*3/uL (ref 1.7–7.7)
Neutrophils Relative %: 63 %
Platelets: 279 10*3/uL (ref 150–400)
RBC: 3.19 MIL/uL — ABNORMAL LOW (ref 4.22–5.81)
RDW: 12.3 % (ref 11.5–15.5)
WBC: 9.2 10*3/uL (ref 4.0–10.5)
nRBC: 0 % (ref 0.0–0.2)

## 2021-09-21 LAB — BASIC METABOLIC PANEL
Anion gap: 5 (ref 5–15)
BUN: 32 mg/dL — ABNORMAL HIGH (ref 8–23)
CO2: 29 mmol/L (ref 22–32)
Calcium: 8.8 mg/dL — ABNORMAL LOW (ref 8.9–10.3)
Chloride: 105 mmol/L (ref 98–111)
Creatinine, Ser: 1.48 mg/dL — ABNORMAL HIGH (ref 0.61–1.24)
GFR, Estimated: 44 mL/min — ABNORMAL LOW (ref >60)
Glucose, Bld: 100 mg/dL — ABNORMAL HIGH (ref 70–99)
Potassium: 4.2 mmol/L (ref 3.5–5.1)
Sodium: 139 mmol/L (ref 135–145)

## 2021-09-21 LAB — PROTIME-INR
INR: 1.1 (ref 0.8–1.2)
Prothrombin Time: 13.9 seconds (ref 11.4–15.2)

## 2021-09-21 MED ORDER — LIDOCAINE-EPINEPHRINE 2 %-1:100000 IJ SOLN
20.0000 mL | Freq: Once | INTRAMUSCULAR | Status: AC
  Administered 2021-09-21: 20 mL
  Filled 2021-09-21: qty 20

## 2021-09-21 NOTE — ED Provider Notes (Signed)
?[facility] ?Provider Note ? ? ?CSN: [id number] ?Arrival date & time: 09/21/21  0316 ? ?  ? ?History ? ?Chief Complaint  ?Patient presents with  ? Coagulation Disorder  ? ? ?Max Cole is a 88 y.o. male. ? ?HPI ?Patient with history of atrial fibrillation, cardiomyopathy, chronic respiratory failure on oxygen presents with bleeding from left arm. ?Patient reports that he had a biopsy performed by her PCP yesterday. ?He woke up tonight with bleeding from the wound.  No weakness or dizziness.  He takes anticoagulation daily ?He has no other acute complaints ?  ?Past Medical History:  ?Diagnosis Date  ? Atrial fibrillation ([place])   ? Bradycardia   ? Cardiomyopathy 2005  ? a. Possibly alcoholic; b. cath in 6384-66% ostial D1, PCW of 12, EF of 35-40%;  c. EF of 0.25 in 11/2003;  d. 40-45% in 05/2004;  e. 25% in 10/2008 by echo;  f. 04/2015 Echo: EF 40-45%, diff HK, sev inflat/inf HK, Gr 1 DD, mild AI, triv TR.  ? Carotid artery occlusion   ? Cerebrovascular disease   ? Carotid ultrasound in 12/2010-60-79% left internal carotid artery, less than 40% or right RICA, no change  ? Chronic kidney disease   ? Creatinine of 1.6 in 2009  ? Hyperlipidemia 10/15/2011  ? Lipid profile in 03/2009:231, 136, 56, 148  ? Left bundle branch block   ? Prostate carcinoma ([place])   ? Syncope   ? [place] CRT-P 07/03/15 Dr. Lovena Le  ? ? ?Home Medications ?Prior to Admission medications   ?Medication Sig Start Date End Date Taking? Authorizing Provider  ?apixaban (ELIQUIS) 2.5 MG TABS tablet Take 1 tablet (2.5 mg total) by mouth 2 (two) times daily. 06/10/20   Gerlene Fee, NP  ?carvedilol (COREG) 6.25 MG tablet Take 1 tablet (6.25 mg total) by mouth 2 (two) times daily with a meal. May take an extra 1/2 tablet for breakthrough afib 03/17/21   Sherran Needs, NP  ?ferrous sulfate 325 (65 FE) MG tablet Take 1 tablet (325 mg total) by mouth daily with breakfast. 06/10/20   Gerlene Fee, NP  ?furosemide (LASIX) 20 MG tablet  Take 1 tablet (20 mg total) by mouth daily. 06/10/20   Gerlene Fee, NP  ?HYDROcodone-acetaminophen (NORCO/VICODIN) 5-325 MG tablet Take 1 tablet by mouth 2 (two) times daily. 1/2 tab bid    [provider]  ?Methocarbamol (ROBAXIN PO) Take by mouth. 1/2 tab in am and 1/2 tab in pm    [provider]  ?OXYGEN Inhale 2 L into the lungs continuous. 06/04/20   [provider]  ?pravastatin (PRAVACHOL) 20 MG tablet Take 1 tablet (20 mg total) by mouth every evening. 06/10/20   Gerlene Fee, NP  ?   ? ?Allergies    ?Patient has no known allergies.   ? ?Review of Systems   ?Review of Systems  ?Constitutional:  Negative for fever.  ?Skin:  Positive for wound.  ?Neurological:  Negative for dizziness.  ? ?Physical Exam ?Updated Vital Signs ?BP (!) 145/68   Pulse 67   Temp 98.7 ?F (37.1 ?C) (Oral)   Resp 19   Ht 1.854 m ('6\' 1"'$ )   Wt 77.1 kg   SpO2 100%   BMI 22.43 kg/m?  ?Physical Exam ?CONSTITUTIONAL: Elderly, no acute distress ?HEAD: Normocephalic/atraumatic, bandages noted to the left scalp no active bleeding ?ENMT: Mucous membranes moist ?NECK: supple no meningeal signs ?LUNGS:  no apparent distress, patient is on nasal cannula ?  NEURO: Pt is awake/alert/appropriate, moves all extremitiesx4.  No facial droop.   ?EXTREMITIES: pulses normal/equal, full ROM ?Wound noted left upper extremity.  Active bleeding noted.  See photo below ?SKIN: warm, see photo ?PSYCH: no abnormalities of mood noted, alert and oriented to situation ? ? ?Patient gave verbal permission to utilize photo for medical documentation only ?The image was not stored on any personal device ? ?ED Results / Procedures / Treatments   ?Labs ?(all labs ordered are listed, but only abnormal results are displayed) ?Labs Reviewed  ?BASIC METABOLIC PANEL - Abnormal; Notable for the following components:  ?    Result Value  ? Glucose, Bld 100 (*)   ? BUN 32 (*)   ? Creatinine, Ser 1.48 (*)   ? Calcium 8.8 (*)   ? GFR, Estimated  44 (*)   ? All other components within normal limits  ?CBC WITH DIFFERENTIAL/PLATELET - Abnormal; Notable for the following components:  ? RBC 3.19 (*)   ? Hemoglobin 10.2 (*)   ? HCT 31.6 (*)   ? Monocytes Absolute 1.2 (*)   ? Abs Immature Granulocytes 0.09 (*)   ? All other components within normal limits  ?PROTIME-INR  ? ? ?EKG ?None ? ?Radiology ?No results found. ? ?Procedures ?Procedures  ? ? ?Medications Ordered in ED ?Medications  ?lidocaine-EPINEPHrine (XYLOCAINE W/EPI) 2 %-1:100000 (with pres) injection 20 mL (20 mLs Infiltration Given by Other 09/21/21 0557)  ? ? ?ED Course/ Medical Decision Making/ A&P ?Clinical Course as of 09/21/21 0609  ?Tue Sep 21, 2021  ?0544 Creatinine(!): 1.48 ?Renal insufficiency [DW]  ?0544 Hemoglobin(!): 10.2 ?Acute on chronic anemia [DW]  ?  ?Clinical Course User Index ?[DW] Ripley Fraise, MD  ? ?                        ?Medical Decision Making ?Amount and/or Complexity of Data Reviewed ?Labs: ordered. Decision-making details documented in ED Course. ? ?Risk ?Prescription drug management. ? ? ?Patient presents with bleeding from a biopsy site in his left upper extremity.  Patient is on anticoagulation.  Wound was infiltrated with lidocaine with epinephrine has been holding pressure.  The bleeding is improving.  We will continue to monitor ? ?Labs reviewed which reveal renal insufficiency and acute on chronic anemia. ? ?6:09 AM ?Patient had arm bandaged and monitored for over 20 minutes without any recurrent bleeding. ?Distal pulses intact.  He is in no acute distress.  Will discharge home.  Advise follow-up with PCP ? ? ? ? ? ? ?Final Clinical Impression(s) / ED Diagnoses ?Final diagnoses:  ?Postop check  ? ? ?Rx / DC Orders ?ED Discharge Orders   ? ? None  ? ?  ? ? ?  ?Ripley Fraise, MD ?09/21/21 [phone] ? ?

## 2021-09-21 NOTE — ED Triage Notes (Signed)
Pt had placed removed off left arm yesterday and woke with it bleeding this am.  ?

## 2021-09-21 NOTE — Discharge Instructions (Signed)
If the bleeding starts again and you are unable to control it, please return for evaluation ?

## 2021-10-20 ENCOUNTER — Other Ambulatory Visit: Payer: Self-pay

## 2021-10-20 ENCOUNTER — Encounter (HOSPITAL_COMMUNITY): Payer: Self-pay | Admitting: Family Medicine

## 2021-10-20 ENCOUNTER — Observation Stay (HOSPITAL_COMMUNITY): Payer: Medicare HMO

## 2021-10-20 ENCOUNTER — Observation Stay (HOSPITAL_COMMUNITY)
Admission: EM | Admit: 2021-10-20 | Discharge: 2021-10-21 | Disposition: A | Discharge status: Home Health | Payer: Medicare HMO | Attending: Family Medicine | Admitting: Family Medicine

## 2021-10-20 ENCOUNTER — Observation Stay (HOSPITAL_BASED_OUTPATIENT_CLINIC_OR_DEPARTMENT_OTHER): Payer: Medicare HMO

## 2021-10-20 ENCOUNTER — Emergency Department (HOSPITAL_COMMUNITY): Payer: Medicare HMO

## 2021-10-20 DIAGNOSIS — Z8546 Personal history of malignant neoplasm of prostate: Secondary | ICD-10-CM

## 2021-10-20 DIAGNOSIS — I5042 Chronic combined systolic (congestive) and diastolic (congestive) heart failure: Secondary | ICD-10-CM | POA: Diagnosis present

## 2021-10-20 DIAGNOSIS — Z87891 Personal history of nicotine dependence: Secondary | ICD-10-CM | POA: Diagnosis not present

## 2021-10-20 DIAGNOSIS — I428 Other cardiomyopathies: Secondary | ICD-10-CM | POA: Diagnosis not present

## 2021-10-20 DIAGNOSIS — E785 Hyperlipidemia, unspecified: Secondary | ICD-10-CM | POA: Diagnosis present

## 2021-10-20 DIAGNOSIS — I5022 Chronic systolic (congestive) heart failure: Secondary | ICD-10-CM | POA: Diagnosis not present

## 2021-10-20 DIAGNOSIS — I447 Left bundle-branch block, unspecified: Secondary | ICD-10-CM | POA: Diagnosis not present

## 2021-10-20 DIAGNOSIS — R9431 Abnormal electrocardiogram [ECG] [EKG]: Secondary | ICD-10-CM | POA: Diagnosis not present

## 2021-10-20 DIAGNOSIS — Z79899 Other long term (current) drug therapy: Secondary | ICD-10-CM | POA: Insufficient documentation

## 2021-10-20 DIAGNOSIS — Z23 Encounter for immunization: Secondary | ICD-10-CM | POA: Diagnosis not present

## 2021-10-20 DIAGNOSIS — S0990XA Unspecified injury of head, initial encounter: Secondary | ICD-10-CM | POA: Diagnosis not present

## 2021-10-20 DIAGNOSIS — N1832 Chronic kidney disease, stage 3b: Secondary | ICD-10-CM

## 2021-10-20 DIAGNOSIS — I480 Paroxysmal atrial fibrillation: Secondary | ICD-10-CM

## 2021-10-20 DIAGNOSIS — N183 Chronic kidney disease, stage 3 unspecified: Secondary | ICD-10-CM | POA: Diagnosis not present

## 2021-10-20 DIAGNOSIS — I4891 Unspecified atrial fibrillation: Secondary | ICD-10-CM | POA: Diagnosis not present

## 2021-10-20 DIAGNOSIS — Y9239 Other specified sports and athletic area as the place of occurrence of the external cause: Secondary | ICD-10-CM | POA: Diagnosis not present

## 2021-10-20 DIAGNOSIS — D500 Iron deficiency anemia secondary to blood loss (chronic): Secondary | ICD-10-CM | POA: Diagnosis not present

## 2021-10-20 DIAGNOSIS — W0110XA Fall on same level from slipping, tripping and stumbling with subsequent striking against unspecified object, initial encounter: Secondary | ICD-10-CM | POA: Insufficient documentation

## 2021-10-20 DIAGNOSIS — T148XXA Other injury of unspecified body region, initial encounter: Secondary | ICD-10-CM | POA: Diagnosis not present

## 2021-10-20 DIAGNOSIS — I482 Chronic atrial fibrillation, unspecified: Secondary | ICD-10-CM | POA: Diagnosis present

## 2021-10-20 DIAGNOSIS — Y93B9 Activity, other involving muscle strengthening exercises: Secondary | ICD-10-CM | POA: Insufficient documentation

## 2021-10-20 DIAGNOSIS — I130 Hypertensive heart and chronic kidney disease with heart failure and stage 1 through stage 4 chronic kidney disease, or unspecified chronic kidney disease: Secondary | ICD-10-CM | POA: Insufficient documentation

## 2021-10-20 DIAGNOSIS — I6523 Occlusion and stenosis of bilateral carotid arteries: Secondary | ICD-10-CM | POA: Diagnosis not present

## 2021-10-20 DIAGNOSIS — Z7901 Long term (current) use of anticoagulants: Secondary | ICD-10-CM | POA: Insufficient documentation

## 2021-10-20 DIAGNOSIS — R55 Syncope and collapse: Principal | ICD-10-CM | POA: Diagnosis present

## 2021-10-20 DIAGNOSIS — N400 Enlarged prostate without lower urinary tract symptoms: Secondary | ICD-10-CM | POA: Diagnosis present

## 2021-10-20 DIAGNOSIS — I499 Cardiac arrhythmia, unspecified: Secondary | ICD-10-CM | POA: Diagnosis not present

## 2021-10-20 DIAGNOSIS — R2689 Other abnormalities of gait and mobility: Secondary | ICD-10-CM | POA: Insufficient documentation

## 2021-10-20 DIAGNOSIS — R52 Pain, unspecified: Secondary | ICD-10-CM | POA: Diagnosis not present

## 2021-10-20 DIAGNOSIS — J9611 Chronic respiratory failure with hypoxia: Secondary | ICD-10-CM | POA: Diagnosis present

## 2021-10-20 DIAGNOSIS — Z950 Presence of cardiac pacemaker: Secondary | ICD-10-CM | POA: Diagnosis present

## 2021-10-20 DIAGNOSIS — M4312 Spondylolisthesis, cervical region: Secondary | ICD-10-CM | POA: Diagnosis not present

## 2021-10-20 HISTORY — DX: Chronic kidney disease, stage 3 unspecified: N18.30

## 2021-10-20 HISTORY — DX: Disorder of arteries and arterioles, unspecified: I77.9

## 2021-10-20 LAB — CBC WITH DIFFERENTIAL/PLATELET
Abs Immature Granulocytes: 0.03 10*3/uL (ref 0.00–0.07)
Basophils Absolute: 0.1 10*3/uL (ref 0.0–0.1)
Basophils Relative: 1 %
Eosinophils Absolute: 0.2 10*3/uL (ref 0.0–0.5)
Eosinophils Relative: 2 %
HCT: 34.4 % — ABNORMAL LOW (ref 39.0–52.0)
Hemoglobin: 11.2 g/dL — ABNORMAL LOW (ref 13.0–17.0)
Immature Granulocytes: 0 %
Lymphocytes Relative: 27 %
Lymphs Abs: 2.8 10*3/uL (ref 0.7–4.0)
MCH: 32.4 pg (ref 26.0–34.0)
MCHC: 32.6 g/dL (ref 30.0–36.0)
MCV: 99.4 fL (ref 80.0–100.0)
Monocytes Absolute: 1.1 10*3/uL — ABNORMAL HIGH (ref 0.1–1.0)
Monocytes Relative: 11 %
Neutro Abs: 6.2 10*3/uL (ref 1.7–7.7)
Neutrophils Relative %: 59 %
Platelets: 286 10*3/uL (ref 150–400)
RBC: 3.46 MIL/uL — ABNORMAL LOW (ref 4.22–5.81)
RDW: 12.6 % (ref 11.5–15.5)
WBC: 10.4 10*3/uL (ref 4.0–10.5)
nRBC: 0 % (ref 0.0–0.2)

## 2021-10-20 LAB — BASIC METABOLIC PANEL
Anion gap: 9 (ref 5–15)
BUN: 26 mg/dL — ABNORMAL HIGH (ref 8–23)
CO2: 28 mmol/L (ref 22–32)
Calcium: 9.1 mg/dL (ref 8.9–10.3)
Chloride: 101 mmol/L (ref 98–111)
Creatinine, Ser: 1.63 mg/dL — ABNORMAL HIGH (ref 0.61–1.24)
GFR, Estimated: 40 mL/min — ABNORMAL LOW (ref >60)
Glucose, Bld: 127 mg/dL — ABNORMAL HIGH (ref 70–99)
Potassium: 4.1 mmol/L (ref 3.5–5.1)
Sodium: 138 mmol/L (ref 135–145)

## 2021-10-20 LAB — ECHOCARDIOGRAM COMPLETE
AR max vel: 1.92 cm2
AV Area VTI: 2.03 cm2
AV Area mean vel: 2 cm2
AV Mean grad: 7 mmHg
AV Peak grad: 14.4 mmHg
Ao pk vel: 1.9 m/s
Area-P 1/2: 2.66 cm2
Height: 72 in
P 1/2 time: 535 msec
S' Lateral: 2.9 cm
Weight: 2640 oz

## 2021-10-20 MED ORDER — ONDANSETRON HCL 4 MG PO TABS: 4.0000 mg | ORAL_TABLET | Freq: Four times a day (QID) | ORAL | Status: DC | PRN

## 2021-10-20 MED ORDER — HEPARIN SODIUM (PORCINE) 5000 UNIT/ML IJ SOLN: 5000.0000 [IU] | Freq: Three times a day (TID) | INTRAMUSCULAR | Status: DC

## 2021-10-20 MED ORDER — IPRATROPIUM BROMIDE 0.02 % IN SOLN: 0.5000 mg | Freq: Four times a day (QID) | RESPIRATORY_TRACT | Status: DC | PRN

## 2021-10-20 MED ORDER — HYDROMORPHONE HCL 1 MG/ML IJ SOLN: 0.5000 mg | INTRAMUSCULAR | Status: DC | PRN

## 2021-10-20 MED ORDER — SENNOSIDES-DOCUSATE SODIUM 8.6-50 MG PO TABS: 1.0000 | ORAL_TABLET | Freq: Every evening | ORAL | Status: DC | PRN

## 2021-10-20 MED ORDER — HYDRALAZINE HCL 20 MG/ML IJ SOLN: 10.0000 mg | INTRAMUSCULAR | Status: DC | PRN

## 2021-10-20 MED ORDER — SODIUM CHLORIDE 0.9 % IV SOLN: 250.0000 mL | INTRAVENOUS | Status: DC | PRN

## 2021-10-20 MED ORDER — DILTIAZEM LOAD VIA INFUSION
15.0000 mg | Freq: Once | INTRAVENOUS | Status: AC
  Administered 2021-10-20: 15 mg via INTRAVENOUS
  Filled 2021-10-20: qty 15

## 2021-10-20 MED ORDER — APIXABAN 2.5 MG PO TABS
2.5000 mg | ORAL_TABLET | Freq: Two times a day (BID) | ORAL | Status: DC
  Administered 2021-10-20 – 2021-10-21 (×2): 2.5 mg via ORAL
  Filled 2021-10-20 (×2): qty 1

## 2021-10-20 MED ORDER — SODIUM CHLORIDE 0.9 % IV SOLN
INTRAVENOUS | Status: AC
Start: 2021-10-20 — End: 2021-10-21

## 2021-10-20 MED ORDER — SODIUM CHLORIDE 0.9% FLUSH
3.0000 mL | Freq: Two times a day (BID) | INTRAVENOUS | Status: DC
  Administered 2021-10-20 – 2021-10-21 (×2): 3 mL via INTRAVENOUS

## 2021-10-20 MED ORDER — BACITRACIN ZINC 500 UNIT/GM EX OINT: TOPICAL_OINTMENT | Freq: Once | CUTANEOUS | Status: AC

## 2021-10-20 MED ORDER — BACITRACIN ZINC 500 UNIT/GM EX OINT
TOPICAL_OINTMENT | CUTANEOUS | Status: AC
  Filled 2021-10-20: qty 0.9

## 2021-10-20 MED ORDER — BISACODYL 5 MG PO TBEC: 5.0000 mg | DELAYED_RELEASE_TABLET | Freq: Every day | ORAL | Status: DC | PRN

## 2021-10-20 MED ORDER — TRAZODONE HCL 50 MG PO TABS
25.0000 mg | ORAL_TABLET | Freq: Every evening | ORAL | Status: DC | PRN
  Administered 2021-10-20: 25 mg via ORAL
  Filled 2021-10-20: qty 1

## 2021-10-20 MED ORDER — SODIUM CHLORIDE 0.9 % IV BOLUS
1000.0000 mL | Freq: Once | INTRAVENOUS | Status: AC
  Administered 2021-10-20: 1000 mL via INTRAVENOUS

## 2021-10-20 MED ORDER — CARVEDILOL 3.125 MG PO TABS
6.2500 mg | ORAL_TABLET | Freq: Two times a day (BID) | ORAL | Status: DC
  Administered 2021-10-20 – 2021-10-21 (×2): 6.25 mg via ORAL
  Filled 2021-10-20 (×2): qty 2

## 2021-10-20 MED ORDER — TETANUS-DIPHTH-ACELL PERTUSSIS 5-2.5-18.5 LF-MCG/0.5 IM SUSY
0.5000 mL | PREFILLED_SYRINGE | Freq: Once | INTRAMUSCULAR | Status: AC
  Administered 2021-10-20: 0.5 mL via INTRAMUSCULAR
  Filled 2021-10-20: qty 0.5

## 2021-10-20 MED ORDER — FERROUS SULFATE 325 (65 FE) MG PO TABS
325.0000 mg | ORAL_TABLET | Freq: Every day | ORAL | Status: DC
  Administered 2021-10-21: 325 mg via ORAL
  Filled 2021-10-20: qty 1

## 2021-10-20 MED ORDER — ONDANSETRON HCL 4 MG/2ML IJ SOLN: 4.0000 mg | Freq: Four times a day (QID) | INTRAMUSCULAR | Status: DC | PRN

## 2021-10-20 MED ORDER — DILTIAZEM HCL-DEXTROSE 125-5 MG/125ML-% IV SOLN (PREMIX)
5.0000 mg/h | INTRAVENOUS | Status: DC
  Administered 2021-10-20: 5 mg/h via INTRAVENOUS
  Filled 2021-10-20: qty 125

## 2021-10-20 MED ORDER — FUROSEMIDE 20 MG PO TABS
20.0000 mg | ORAL_TABLET | Freq: Every day | ORAL | Status: DC
  Administered 2021-10-20 – 2021-10-21 (×2): 20 mg via ORAL
  Filled 2021-10-20 (×2): qty 1

## 2021-10-20 MED ORDER — ACETAMINOPHEN 650 MG RE SUPP: 650.0000 mg | Freq: Four times a day (QID) | RECTAL | Status: DC | PRN

## 2021-10-20 MED ORDER — SODIUM CHLORIDE 0.9 % IV SOLN: INTRAVENOUS | Status: DC

## 2021-10-20 MED ORDER — OXYCODONE HCL 5 MG PO TABS
5.0000 mg | ORAL_TABLET | ORAL | Status: DC | PRN
  Administered 2021-10-20: 5 mg via ORAL
  Filled 2021-10-20: qty 1

## 2021-10-20 MED ORDER — SODIUM CHLORIDE 0.9% FLUSH: 3.0000 mL | INTRAVENOUS | Status: DC | PRN

## 2021-10-20 MED ORDER — PRAVASTATIN SODIUM 10 MG PO TABS
20.0000 mg | ORAL_TABLET | Freq: Every evening | ORAL | Status: DC
  Administered 2021-10-20: 20 mg via ORAL
  Filled 2021-10-20 (×2): qty 2

## 2021-10-20 MED ORDER — ACETAMINOPHEN 325 MG PO TABS
650.0000 mg | ORAL_TABLET | Freq: Four times a day (QID) | ORAL | Status: DC | PRN
Start: 2021-10-20 — End: 2021-10-21

## 2021-10-20 MED ORDER — LEVALBUTEROL HCL 0.63 MG/3ML IN NEBU: 0.6300 mg | INHALATION_SOLUTION | Freq: Four times a day (QID) | RESPIRATORY_TRACT | Status: DC | PRN

## 2021-10-20 MED ORDER — LIDOCAINE HCL (PF) 1 % IJ SOLN
10.0000 mL | Freq: Once | INTRAMUSCULAR | Status: AC
  Administered 2021-10-20: 10 mL
  Filled 2021-10-20: qty 10

## 2021-10-20 NOTE — ED Notes (Signed)
Pt transported to [facility]

## 2021-10-20 NOTE — Hospital Course (Signed)
?  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 Presented with syncopal episode in the gym. ? ?Patient reports he was in the gym where he felt palpitation and had a syncopal episode.  Patient lost consciousness for few seconds the ground had some skin tear. ?Denies any headaches visual changes or asymmetric weaknesses.  Did not have any chest pain. ? ?ED evaluation: ?Upon arrival in ED patient was found in A-fib with RVR, with heart rate as high as 180 ---patient was given Cardizem then initiated on Cardizem drip.Marland Kitchen ?Initial EKG revealing heart rate of 165, pacemaker spikes, wide-complex, ? ?Blood pressure 119/77, pulse 79, temperature 97.9 ?F (36.6 ?C), temperature source Oral, resp. rate 16, height 6' (1.829 m), weight 74.8 kg, SpO2 96 %. ? ?Skin lacerations were repaired in ED ?Hemoglobin 11.2, hematocrit 34.4, BUN 26, creatinine 1.63, GFR 40 ? ?CT head/cervical spine: ?IMPRESSION: Generalized atrophy. ? No acute intracranial abnormalities. ? Multilevel degenerative disc and facet disease changes of the ?cervical spine. ? No acute cervical spine abnormalities ?

## 2021-10-20 NOTE — Assessment & Plan Note (Signed)
-   Chronic respiratory failure, dependent on 2 L of O2 via nasal cannula ? 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

## 2021-10-20 NOTE — Assessment & Plan Note (Signed)
-   Upon arrival patient was in A-fib with RVR, heart rate as high as 180... Cardizem IV was given started on Cardizem drip ?-Heart rate has improved ?-Continue Cardizem drip, will titrate the patient off ?-We will continue home medication of Eliquis, Coreg, ?-Cardiologist consulted, interrogating patient's pacemaker ?

## 2021-10-20 NOTE — Assessment & Plan Note (Signed)
-   Likely due to A-fib with RVR-he was working out in the gym ?-CT head/cervical spine was reviewed, chronic degenerative changes with atrophy, negative for any acute abnormalities ?-Upon arrival in ED patient was in A-fib with RVR, was started on Cardizem drip ?-We will admit to monitored bed to telemetry bed ?-We will monitor heart rate closely, ?-Consulting cardiology ?-Interrogating his pacemaker :  ?-Obtaining bilateral carotid studies, ? ?Last echo 02/07/2020 EJ EF 50-55% LV low normal function, mild LVH, grade 1 diastolic dysfunction, LA severely dilated, RA moderately dilated, no significant regurgitation  ? ?Repeating 2D echocardiogram:  ?

## 2021-10-20 NOTE — H&P (Signed)
?History and Physical  ? ?Patient: Max Cole                            PCP: Asencion Noble, MD                    ?DOB: 1927/07/11            DOA: 10/20/2021 ?KCL:[id number]             DOS: 10/20/2021, 3:40 PM ? ?Asencion Noble, MD ? ?Patient coming from:   HOME  ?I have personally reviewed patient's medical records, in electronic medical records, including:  ?[facility] link, and care everywhere.  ? ? ?Chief Complaint:  ? ?Chief Complaint  ?Patient presents with  ? Fall  ? ? ?History of present illness:  ? ? ? 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 Presented with syncopal episode in the gym. ? ?Patient reports he was in the gym where he felt palpitation and had a syncopal episode.  Patient lost consciousness for few seconds the ground had some skin tear. ?Denies any headaches visual changes or asymmetric weaknesses.  Did not have any chest pain. ? ?ED evaluation: ?Upon arrival in ED patient was found in A-fib with RVR, with heart rate as high as 180 ---patient was given Cardizem then initiated on Cardizem drip.Marland Kitchen ?Initial EKG revealing heart rate of 165, pacemaker spikes, wide-complex, ? ?Blood pressure 119/77, pulse 79, temperature 97.9 ?F (36.6 ?C), temperature source Oral, resp. rate 16, height 6' (1.829 m), weight 74.8 kg, SpO2 96 %. ? ?Skin lacerations were repaired in ED ?Hemoglobin 11.2, hematocrit 34.4, BUN 26, creatinine 1.63, GFR 40 ? ?CT head/cervical spine: ?IMPRESSION: Generalized atrophy. ? No acute intracranial abnormalities. ? Multilevel degenerative disc and facet disease changes of the ?cervical spine. ? No acute cervical spine abnormalities ? ? ? Patient Denies having: Fever, Chills, Cough, SOB, Chest Pain, Abd pain, N/V/D, headache, dizziness, lightheadedness,  Dysuria, Joint pain, rash, open wounds ? ? ?Review of Systems: As per  HPI, otherwise 10 point review of systems were negative.  ?----------------------------------------------------------------------------------------------------------------- ? ?No Known Allergies ? ?Home MEDs:  ?Prior to Admission medications   ?Medication Sig Start Date End Date Taking? Authorizing Provider  ?apixaban (ELIQUIS) 2.5 MG TABS tablet Take 1 tablet (2.5 mg total) by mouth 2 (two) times daily. 06/10/20  Yes Gerlene Fee, NP  ?carvedilol (COREG) 6.25 MG tablet Take 1 tablet (6.25 mg total) by mouth 2 (two) times daily with a meal. May take an extra 1/2 tablet for breakthrough afib 03/17/21  Yes Sherran Needs, NP  ?ferrous sulfate 325 (65 FE) MG tablet Take 1 tablet (325 mg total) by mouth daily with breakfast. 06/10/20  Yes Gerlene Fee, NP  ?furosemide (LASIX) 20 MG tablet Take 1 tablet (20 mg total) by mouth daily. 06/10/20  Yes Gerlene Fee, NP  ?HYDROcodone-acetaminophen (NORCO/VICODIN) 5-325 MG tablet Take 1 tablet by mouth 2 (two) times daily.   Yes [provider]  ?Methocarbamol (ROBAXIN PO) Take 500 mg by mouth See admin instructions. 1 tab in am and 1/2 tab in pm   Yes [provider]  ?OXYGEN Inhale 2 L into the lungs continuous. 06/04/20  Yes [provider]  ?pravastatin (PRAVACHOL) 20 MG tablet Take 1 tablet (20 mg  total) by mouth every evening. 06/10/20  Yes Gerlene Fee, NP  ? ? ?PRN MEDs: sodium chloride, acetaminophen **OR** acetaminophen, bisacodyl, hydrALAZINE, HYDROmorphone (DILAUDID) injection, ipratropium, levalbuterol, ondansetron **OR** ondansetron (ZOFRAN) IV, oxyCODONE, senna-docusate, sodium chloride flush, traZODone ? ?Past Medical History:  ?Diagnosis Date  ? Atrial fibrillation ([place])   ? Bradycardia   ? Cardiomyopathy 2005  ? a. Possibly alcoholic; b. cath in 4010-27% ostial D1, PCW of 12, EF of 35-40%;  c. EF of 0.25 in 11/2003;  d. 40-45% in 05/2004;  e. 25% in 10/2008 by echo;  f. 04/2015 Echo: EF 40-45%, diff HK, sev inflat/inf  HK, Gr 1 DD, mild AI, triv TR.  ? Carotid artery occlusion   ? Cerebrovascular disease   ? Carotid ultrasound in 12/2010-60-79% left internal carotid artery, less than 40% or right RICA, no change  ? Chronic kidney disease   ? Creatinine of 1.6 in 2009  ? Hyperlipidemia 10/15/2011  ? Lipid profile in 03/2009:231, 136, 56, 148  ? Left bundle branch block   ? Prostate carcinoma ([place])   ? Syncope   ? [place] CRT-P 07/03/15 Dr. Lovena Le  ? ? ?Past Surgical History:  ?Procedure Laterality Date  ? CATARACT EXTRACTION    ? Right  ? COLONOSCOPY N/A 08/16/2012  ? Procedure: COLONOSCOPY;  Surgeon: Rogene Houston, MD;  Location: [facility];  Service: Endoscopy;  Laterality: N/A;  930  ? COLONOSCOPY N/A 10/21/2015  ? Procedure: COLONOSCOPY;  Surgeon: Rogene Houston, MD;  Location: [facility];  Service: Endoscopy;  Laterality: N/A;  1200  ? COLONOSCOPY W/ POLYPECTOMY  2010  ? Diverticulosis  ? EP IMPLANTABLE DEVICE N/A 07/03/2015  ? Procedure: BiV Pacemaker Insertion CRT-P;  Surgeon: Evans Lance, MD;  Location: [facility];  Service: Cardiovascular;  Laterality: N/A;  ? EYE SURGERY Right   ? Cataract  ? HERNIA REPAIR    ? ? ? reports that he quit smoking about 48 years ago. His smoking use included cigarettes. He has a 30.00 pack-year smoking history. He has never used smokeless tobacco. He reports current alcohol use of about 5.0 standard drinks per week. He reports that he does not use drugs. ? ? ?Family History  ?Problem Relation Age of Onset  ? Hypertension Mother   ? ? ?Physical Exam:  ? ?Vitals:  ? 10/20/21 1400 10/20/21 1407 10/20/21 1430 10/20/21 1500  ?BP: 119/77  120/66 115/66  ?Pulse: 80 79 67 62  ?Resp: '16 16 16 '$ (!) 23  ?Temp:      ?TempSrc:      ?SpO2: 98% 96% 100% 99%  ?Weight:      ?Height:      ? ?Constitutional: NAD, calm, comfortable ?Eyes: PERRL, lids and conjunctivae normal ?ENMT: Mucous membranes are moist. Posterior pharynx clear of any exudate or lesions.Normal dentition.  ?Neck: normal, supple, no  masses, no thyromegaly ?Respiratory: clear to auscultation bilaterally, no wheezing, no crackles. Normal respiratory effort. No accessory muscle use.  ?Cardiovascular: Irregularly irregular murmurs / rubs / gallops. No extremity edema. 2+ pedal pulses. No carotid bruits.  ?Abdomen: no tenderness, no masses palpated. No hepatosplenomegaly. Bowel sounds positive.  ?Musculoskeletal: no clubbing / cyanosis. No joint deformity upper and lower extremities. Good ROM, no contractures. Normal muscle tone.  ?Neurologic: CN II-XII grossly intact. Sensation intact, DTR normal. Strength 5/5 in all 4.  ?Psychiatric: Normal judgment and insight. Alert and oriented x 3. Normal mood.  ?Skin: no rashes, lesions, ulcers. No induration ?Decubitus/ulcers:  ?Wounds:  per nursing documentation ?  ? ? ?Labs on admission:  ?  ?I have personally reviewed following labs and imaging studies ? ?CBC: ?Recent Labs  ?Lab 10/20/21 ?1220  ?WBC 10.4  ?NEUTROABS 6.2  ?HGB 11.2*  ?HCT 34.4*  ?MCV 99.4  ?PLT 286  ? ?Basic Metabolic Panel: ?Recent Labs  ?Lab 10/20/21 ?1220  ?NA 138  ?K 4.1  ?CL 101  ?CO2 28  ?GLUCOSE 127*  ?BUN 26*  ?CREATININE 1.63*  ?CALCIUM 9.1  ? ?Urine analysis: ?   ?Component Value Date/Time  ? [place] YELLOW 06/08/2020 1114  ? APPEARANCEUR CLEAR 06/08/2020 1114  ? LABSPEC 1.014 06/08/2020 1114  ? PHURINE 7.0 06/08/2020 1114  ? [place] NEGATIVE 06/08/2020 1114  ? GLUCOSEU neg 02/26/2009 0000  ? [place] NEGATIVE 06/08/2020 1114  ? [place] NEGATIVE 06/08/2020 1114  ? [place] NEGATIVE 06/08/2020 1114  ? [place] NEGATIVE 06/08/2020 1114  ? NITRITE NEGATIVE 06/08/2020 1114  ? LEUKOCYTESUR NEGATIVE 06/08/2020 1114  ? ? ?Last A1C:  ?No results found for: HGBA1C ? ? ?Radiologic Exams on Admission:  ? ?CT Head Wo Contrast ? ?Result Date: 10/20/2021 ?CLINICAL DATA:  [place] while working out with weights striking RIGHT side of head, denies loss of consciousness, head and neck trauma. History cardiomyopathy, atrial fibrillation,  cerebrovascular disease, prostate cancer EXAM: CT HEAD WITHOUT CONTRAST CT CERVICAL SPINE WITHOUT CONTRAST TECHNIQUE: Multidetector CT imaging of the head and cervical spine was performed following the standard pr

## 2021-10-20 NOTE — Consult Note (Signed)
? ?Cardiology Consultation:  ? ?Patient ID: Max Cole; [id number]; 1928/06/16  ? ?Admit date: 10/20/2021 ?Date of Consult: 10/20/2021 ? ?Primary Care Provider: Asencion Noble, MD ?Primary Cardiologist: Max Peru, MD ?Primary Electrophysiologist: Max Peru, MD ? ? ?Patient Profile:  ? ?Max Cole is a 88 y.o. male with a history of paroxysmal atrial fibrillation, symptomatic bradycardia status post Max Cole [employer] Cole, nonischemic cardiomyopathy with normalization of LVEF as of 2021, CKD stage IIIb, carotid artery disease, hyperlipidemia, and left bundle branch block who is being seen today for the evaluation of syncope at the request of Max Cole. ? ?History of Present Illness:  ? ?Max Cole presents via EMS to the [facility] ER after an episode of syncope that occurred while he was exercising at the gym.  I reviewed his chart.  He tells me that he was using a seated chest press machine when he started to feel dizzy.  No specific chest pain or palpitations at the time.  He got up and went to go sit in another chair, when he went to stand up he fell to the floor, states that he hit his head and arm, was very quickly aware of his surroundings and it does not sound like he was unconscious for a significant period time. ? ?He has a history of paroxysmal atrial fibrillation that is symptomatic, and in fact has presented very similar to the above.  Last episode at home was a few months ago and did not result in syncope, he took took an extra half Coreg tablet with resolution of symptoms.  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  I reviewed his note from the atrial fibrillation clinic in September 2022. ? ?He was noted to be in rapid atrial fibrillation with left bundle branch block by ECG at presentation, has spontaneously converted to sinus rhythm. ? ?Pacemaker is to be interrogated in the ER. ? ?Past Medical History:  ?Diagnosis Date  ? Atrial  fibrillation ([place])   ? Bradycardia   ? Cardiomyopathy 06/28/2003  ? a. Possibly alcoholic; b. cath in 1287-86% ostial D1, PCW of 12, EF of 35-40%;  c. EF of 0.25 in 11/2003;  d. 40-45% in 05/2004;  e. 25% in 10/2008 by echo;  f. 04/2015 Echo: EF 40-45%, diff HK, sev inflat/inf HK, Gr 1 DD, mild AI, triv TR.  ? Carotid artery disease ([employer])   ? CKD (chronic kidney disease) stage 3, GFR 30-59 ml/min (HCC)   ? Hyperlipidemia   ? Left bundle branch block   ? Prostate carcinoma ([place])   ? Syncope   ? Max Cole 07/03/15 Max Cole  ? ? ?Past Surgical History:  ?Procedure Laterality Date  ? CATARACT EXTRACTION    ? Right  ? COLONOSCOPY N/A 08/16/2012  ? Procedure: COLONOSCOPY;  Surgeon: Max Houston, MD;  [facility];  Service: Endoscopy;  Laterality: N/A;  930  ? COLONOSCOPY N/A 10/21/2015  ? Procedure: COLONOSCOPY;  Surgeon: Max Houston, MD;  Location: [facility];  Service: Endoscopy;  Laterality: N/A;  1200  ? COLONOSCOPY W/ POLYPECTOMY  2010  ? Diverticulosis  ? EP IMPLANTABLE DEVICE N/A 07/03/2015  ? Procedure: BiV Pacemaker Insertion Cole;  Surgeon: Max Lance, MD;  Location: [facility];  Service: Cardiovascular;  Laterality: N/A;  ? EYE SURGERY Right   ? Cataract  ? HERNIA REPAIR    ?  ? ?Inpatient Medications: ?Scheduled Meds: ? apixaban  2.5 mg Oral  BID  ? carvedilol  6.25 mg Oral BID WC  ? [START ON 10/21/2021] ferrous sulfate  325 mg Oral Q breakfast  ? furosemide  20 mg Oral Daily  ? pravastatin  20 mg Oral QPM  ? sodium chloride flush  3 mL Intravenous Q12H  ? sodium chloride flush  3 mL Intravenous Q12H  ? ?Continuous Infusions: ? sodium chloride    ? sodium chloride    ? diltiazem (CARDIZEM) infusion 5 mg/hr (10/20/21 1440)  ? ?PRN Meds: ?sodium chloride, acetaminophen **OR** acetaminophen, bisacodyl, hydrALAZINE, HYDROmorphone (DILAUDID) injection, ipratropium, levalbuterol, ondansetron **OR** ondansetron (ZOFRAN) IV, oxyCODONE, senna-docusate, sodium chloride flush,  traZODone ? ?Allergies:   No Known Allergies ? ?Social History:   ?Social History  ? ?Socioeconomic History  ? Marital status: Married  ?  Spouse name: Not on file  ? Number of children: Not on file  ? Years of education: Not on file  ? Highest education level: Not on file  ?Occupational History  ? Not on file  ?Tobacco Use  ? Smoking status: Former  ?  Packs/day: 1.00  ?  Years: 30.00  ?  Pack years: 30.00  ?  Types: Cigarettes  ?  Quit date: 09/28/1973  ?  Years since quitting: 48.0  ? Smokeless tobacco: Never  ?Vaping Use  ? Vaping Use: Never used  ?Substance and Sexual Activity  ? Alcohol use: Yes  ?  Alcohol/week: 5.0 standard drinks  ?  Types: 5 Glasses of wine per week  ?  Comment: History of excessive alcohol use; Alternates between glass of wine vs. Liquor drink 5 days per week (04/2015)  ? Drug use: No  ? Sexual activity: Not on file  ?Other Topics Concern  ? Not on file  ?Social History Narrative  ? Not on file  ? ?Social Determinants of Health  ? ?Financial Resource Strain: Not on file  ?Food Insecurity: Not on file  ?Transportation Needs: Not on file  ?Physical Activity: Not on file  ?Stress: Not on file  ?Social Connections: Not on file  ?Intimate Partner Violence: Not on file  ?  ?Family History:   ?The patient's family history includes Hypertension in his mother. ? ?ROS:  ?Please see the history of present illness.  ?Hearing loss.  All other ROS reviewed and negative.    ? ?Physical Exam/Data:  ? ?Vitals:  ? 10/20/21 1400 10/20/21 1407 10/20/21 1430 10/20/21 1500  ?BP: 119/77  120/66 115/66  ?Pulse: 80 79 67 62  ?Resp: '16 16 16 '$ (!) 23  ?Temp:      ?TempSrc:      ?SpO2: 98% 96% 100% 99%  ?Weight:      ?Height:      ? ?No intake or output data in the 24 hours ending 10/20/21 1605 ?Filed Weights  ? 10/20/21 1156  ?Weight: 74.8 kg  ? ?Body mass index is 22.38 kg/m?.  ? ?Gen: Elderly male, appears comfortable at rest. ?HEENT: Conjunctiva and lids normal, oropharynx clear. ?Neck: Supple, no elevated JVP or  carotid bruits, no thyromegaly. ?Lungs: Clear to auscultation, nonlabored breathing at rest. ?Cardiac: Regular rate and rhythm, no S3, 2/6 systolic murmur, no pericardial rub. ?Abdomen: Soft, nontender, bowel sounds present. ?Extremities: Mild lower leg edema, distal pulses 2+. ?Skin: Warm and dry. ?Musculoskeletal: No kyphosis. ?Neuropsychiatric: Alert and oriented x3, affect grossly appropriate. ? ?EKG:  An ECG dated 10/20/2021 was personally reviewed today and demonstrated:  Atrial fibrillation with RVR and left bundle branch block. ? ?Telemetry:  I personally  reviewed telemetry which shows ventricular pacing. ? ?Relevant CV Studies: ? ?Echocardiogram 02/07/2020: ? 1. Left ventricular ejection fraction, by estimation, is 50 to 55%. The  ?left ventricle has low normal function. The left ventricle has no regional  ?wall motion abnormalities. There is mild left ventricular hypertrophy.  ?Left ventricular diastolic  ?parameters are consistent with Grade I diastolic dysfunction (impaired  ?relaxation).  ? 2. Right ventricular systolic function is normal. The right ventricular  ?size is normal. There is mildly elevated pulmonary artery systolic  ?pressure.  ? 3. Left atrial size was severely dilated.  ? 4. Right atrial size was moderately dilated.  ? 5. The mitral valve is normal in structure. No evidence of mitral valve  ?regurgitation. No evidence of mitral stenosis.  ? 6. The aortic valve is tricuspid. Aortic valve regurgitation is mild. No  ?aortic stenosis is present.  ? 7. The inferior vena cava is normal in size with greater than 50%  ?respiratory variability, suggesting right atrial pressure of 3 mmHg.  ? ?Laboratory Data: ? ?Chemistry ?Recent Labs  ?Lab 10/20/21 ?1220  ?NA 138  ?K 4.1  ?CL 101  ?CO2 28  ?GLUCOSE 127*  ?BUN 26*  ?CREATININE 1.63*  ?CALCIUM 9.1  ?GFRNONAA 40*  ?ANIONGAP 9  ?  ?No results for input(s): PROT, ALBUMIN, AST, ALT, ALKPHOS, BILITOT in the last 168 hours. ?Hematology ?Recent Labs  ?Lab  10/20/21 ?1220  ?WBC 10.4  ?RBC 3.46*  ?HGB 11.2*  ?HCT 34.4*  ?MCV 99.4  ?MCH 32.4  ?MCHC 32.6  ?RDW 12.6  ?PLT 286  ? ? ?Radiology/Studies:  ?CT Head Wo Contrast ? ?Result Date: 10/20/2021 ?CLINICAL DATA:  [place] while wo

## 2021-10-20 NOTE — Progress Notes (Signed)
*  PRELIMINARY RESULTS* ?Echocardiogram ?2D Echocardiogram has been performed. ? ?Max Cole ?10/20/2021, 4:44 PM ?

## 2021-10-20 NOTE — ED Notes (Signed)
FLOAT NURSE 

## 2021-10-20 NOTE — Assessment & Plan Note (Signed)
-   Chronic pacemaker in place for rate control ?-Presented with A-fib with RVR ?-Interrogating pacemaker ?-Cardiology consulted appreciate input ?

## 2021-10-20 NOTE — Assessment & Plan Note (Signed)
Baseline creatinine 1.3- 1.4 ?-Currently creatinine 1.63 >> ?Initiating gentle IV fluid hydration for 12 hours, then resuming Lasix in a.m. ?-Avoiding nephrotoxins ?Creatinine of 1.6 in 2009; 2.2 in 03/2011; 1.7 in 03/2012; 08/2012: BUN/Cr- 23/1.6 ?

## 2021-10-20 NOTE — ED Provider Notes (Signed)
?[facility] ?Provider Note ? ? ?CSN: [id number] ?Arrival date & time: 10/20/21  1140 ? ?  ? ?History ? ?Chief Complaint  ?Patient presents with  ? Fall  ? ? ?Max Cole is a 88 y.o. male. ? ?Patient presents to ER chief complaint of palpitations and syncopal episode.  He was at the gym when he lost consciousness.  He woke up from the ground complaining of skin tears.  Denies any headache or chest pain.  He has a heart monitor that showed his heart rate was rapid.  Has a history of paroxysmal atrial fibrillation and is dependent on 2 L nasal cannula at baseline due to pulmonary fibrosis history.  Denies any fevers or cough or vomiting or diarrhea. ? ? ?  ? ?Home Medications ?Prior to Admission medications   ?Medication Sig Start Date End Date Taking? Authorizing Provider  ?apixaban (ELIQUIS) 2.5 MG TABS tablet Take 1 tablet (2.5 mg total) by mouth 2 (two) times daily. 06/10/20   Gerlene Fee, NP  ?carvedilol (COREG) 6.25 MG tablet Take 1 tablet (6.25 mg total) by mouth 2 (two) times daily with a meal. May take an extra 1/2 tablet for breakthrough afib 03/17/21   Sherran Needs, NP  ?ferrous sulfate 325 (65 FE) MG tablet Take 1 tablet (325 mg total) by mouth daily with breakfast. 06/10/20   Gerlene Fee, NP  ?furosemide (LASIX) 20 MG tablet Take 1 tablet (20 mg total) by mouth daily. 06/10/20   Gerlene Fee, NP  ?HYDROcodone-acetaminophen (NORCO/VICODIN) 5-325 MG tablet Take 1 tablet by mouth 2 (two) times daily. 1/2 tab bid    [provider]  ?Methocarbamol (ROBAXIN PO) Take by mouth. 1/2 tab in am and 1/2 tab in pm    [provider]  ?OXYGEN Inhale 2 L into the lungs continuous. 06/04/20   [provider]  ?pravastatin (PRAVACHOL) 20 MG tablet Take 1 tablet (20 mg total) by mouth every evening. 06/10/20   Gerlene Fee, NP  ?   ? ?Allergies    ?Patient has no known allergies.   ? ?Review of Systems   ?Review of Systems ? ?Physical Exam ?Updated  Vital Signs ?BP 119/77   Pulse 79   Temp 97.9 ?F (36.6 ?C) (Oral)   Resp 16   Ht 6' (1.829 m)   Wt 74.8 kg   SpO2 96%   BMI 22.38 kg/m?  ?Physical Exam ? ?ED Results / Procedures / Treatments   ?Labs ?(all labs ordered are listed, but only abnormal results are displayed) ?Labs Reviewed  ?CBC WITH DIFFERENTIAL/PLATELET - Abnormal; Notable for the following components:  ?    Result Value  ? RBC 3.46 (*)   ? Hemoglobin 11.2 (*)   ? HCT 34.4 (*)   ? Monocytes Absolute 1.1 (*)   ? All other components within normal limits  ?BASIC METABOLIC PANEL - Abnormal; Notable for the following components:  ? Glucose, Bld 127 (*)   ? BUN 26 (*)   ? Creatinine, Ser 1.63 (*)   ? GFR, Estimated 40 (*)   ? All other components within normal limits  ? ? ?EKG ?EKG Interpretation ? ?Date/Time:  Wednesday October 20 2021 11:56:30 EDT ?Ventricular Rate:  165 ?PR Interval:  104 ?QRS Duration: 152 ?QT Interval:  322 ?QTC Calculation: 534 ?R Axis:   -69 ?Text Interpretation: Pacemaker spikes or artifacts Extreme tachycardia with wide complex, no further rhythm analysis attempted Confirmed by Thamas Jaegers ([id number]) on 10/20/2021  12:01:44 PM ? ?Radiology ?CT Head Wo Contrast ? ?Result Date: 10/20/2021 ?CLINICAL DATA:  [place] while working out with weights striking RIGHT side of head, denies loss of consciousness, head and neck trauma. History cardiomyopathy, atrial fibrillation, cerebrovascular disease, prostate cancer EXAM: CT HEAD WITHOUT CONTRAST CT CERVICAL SPINE WITHOUT CONTRAST TECHNIQUE: Multidetector CT imaging of the head and cervical spine was performed following the standard protocol without intravenous contrast. Multiplanar CT image reconstructions of the cervical spine were also generated. RADIATION DOSE REDUCTION: This exam was performed according to the departmental dose-optimization program which includes automated exposure control, adjustment of the mA and/or kV according to patient size and/or use of iterative reconstruction  technique. COMPARISON:  None FINDINGS: CT HEAD FINDINGS Brain: Generalized atrophy. Normal ventricular morphology. No midline shift or mass effect. No intracranial hemorrhage, mass lesion, or evidence of acute infarction. No extra-axial fluid collections. Vascular: Atherosclerotic calcification of internal carotid arteries at skull base Skull: Calvaria intact Sinuses/Orbits: Clear Other: N/A CT CERVICAL SPINE FINDINGS Alignment: Minimal retrolisthesis C3-C4. Remaining alignments normal. Skull base and vertebrae: Osseous demineralization. Skull base intact. Vertebral body heights maintained. Multilevel facet degenerative changes bilaterally. Multilevel disc space narrowing and scattered endplate spur formation. Uncovertebral spurs encroach upon scattered neural foramina bilaterally. No acute fracture, additional subluxation, or bone destruction. Soft tissues and spinal canal: Prevertebral soft tissues normal thickness. Atherosclerotic calcifications at carotid bifurcations. Disc levels:  Unremarkable Upper chest: Lung apices clear Other: N/A IMPRESSION: Generalized atrophy. No acute intracranial abnormalities. Multilevel degenerative disc and facet disease changes of the cervical spine. No acute cervical spine abnormalities. Electronically Signed   By: Lavonia Dana M.D.   On: 10/20/2021 12:56  ? ?CT Cervical Spine Wo Contrast ? ?Result Date: 10/20/2021 ?CLINICAL DATA:  [place] while working out with weights striking RIGHT side of head, denies loss of consciousness, head and neck trauma. History cardiomyopathy, atrial fibrillation, cerebrovascular disease, prostate cancer EXAM: CT HEAD WITHOUT CONTRAST CT CERVICAL SPINE WITHOUT CONTRAST TECHNIQUE: Multidetector CT imaging of the head and cervical spine was performed following the standard protocol without intravenous contrast. Multiplanar CT image reconstructions of the cervical spine were also generated. RADIATION DOSE REDUCTION: This exam was performed according to the  departmental dose-optimization program which includes automated exposure control, adjustment of the mA and/or kV according to patient size and/or use of iterative reconstruction technique. COMPARISON:  None FINDINGS: CT HEAD FINDINGS Brain: Generalized atrophy. Normal ventricular morphology. No midline shift or mass effect. No intracranial hemorrhage, mass lesion, or evidence of acute infarction. No extra-axial fluid collections. Vascular: Atherosclerotic calcification of internal carotid arteries at skull base Skull: Calvaria intact Sinuses/Orbits: Clear Other: N/A CT CERVICAL SPINE FINDINGS Alignment: Minimal retrolisthesis C3-C4. Remaining alignments normal. Skull base and vertebrae: Osseous demineralization. Skull base intact. Vertebral body heights maintained. Multilevel facet degenerative changes bilaterally. Multilevel disc space narrowing and scattered endplate spur formation. Uncovertebral spurs encroach upon scattered neural foramina bilaterally. No acute fracture, additional subluxation, or bone destruction. Soft tissues and spinal canal: Prevertebral soft tissues normal thickness. Atherosclerotic calcifications at carotid bifurcations. Disc levels:  Unremarkable Upper chest: Lung apices clear Other: N/A IMPRESSION: Generalized atrophy. No acute intracranial abnormalities. Multilevel degenerative disc and facet disease changes of the cervical spine. No acute cervical spine abnormalities. Electronically Signed   By: Lavonia Dana M.D.   On: 10/20/2021 12:56   ? ?Procedures ?Procedures  ? ? ?Medications Ordered in ED ?Medications  ?diltiazem (CARDIZEM) 1 mg/mL load via infusion 15 mg (15 mg Intravenous Bolus from Bag 10/20/21 1238)  ?  And  ?diltiazem (CARDIZEM) 125 mg in dextrose 5% 125 mL (1 mg/mL) infusion (7.5 mg/hr Intravenous Rate/Dose Change 10/20/21 1325)  ?Tdap (BOOSTRIX) injection 0.5 mL (0.5 mLs Intramuscular Given 10/20/21 1237)  ?sodium chloride 0.9 % bolus 1,000 mL (1,000 mLs Intravenous New  Bag/Given 10/20/21 1236)  ?lidocaine (PF) (XYLOCAINE) 1 % injection 10 mL (10 mLs Infiltration Given by Other 10/20/21 1335)  ?bacitracin ointment ( Topical Given 10/20/21 1411)  ? ? ?ED Course/ Medical Decision Making/ A&P ?

## 2021-10-20 NOTE — ED Triage Notes (Signed)
Pt arrived by EMS after a fall at the gym. Pt was working out and got dizzy and fell. Pt states he was seated at a machine and then fell into it hitting his head and rights arm ? ?Hx of fib and LBBB. Currently in afib RVR ?Pt is on elaquis   ?

## 2021-10-20 NOTE — Assessment & Plan Note (Signed)
-   In remission, follow-up as an outpatient ?

## 2021-10-20 NOTE — ED Notes (Signed)
Echo-Tech at bedside.  

## 2021-10-20 NOTE — Assessment & Plan Note (Signed)
Last echo 02/07/2020 EJ EF 50-55% LV low normal function, mild LVH, grade 1 diastolic dysfunction, LA severely dilated, RA moderately dilated, no significant regurgitation  ?-Continue current home medications  ?

## 2021-10-20 NOTE — Assessment & Plan Note (Addendum)
-   We will holding home diuretic Lasix for today, resume in a.m., gentle IV fluid hydration monitoring to avoid volume overload ?-Repeating 2D echocardiogram ? ?-Last echo 02/07/2020 EJ EF 50-55% LV low normal function, mild LVH, grade 1 diastolic dysfunction, LA severely dilated, RA moderately dilated, no significant regurgitation ? ?

## 2021-10-20 NOTE — Assessment & Plan Note (Signed)
-   Monitoring closely, no signs of retention ?

## 2021-10-20 NOTE — Assessment & Plan Note (Signed)
Continue pravastatin 

## 2021-10-21 DIAGNOSIS — Z7189 Other specified counseling: Secondary | ICD-10-CM

## 2021-10-21 DIAGNOSIS — Z515 Encounter for palliative care: Secondary | ICD-10-CM | POA: Diagnosis not present

## 2021-10-21 DIAGNOSIS — I4891 Unspecified atrial fibrillation: Secondary | ICD-10-CM

## 2021-10-21 DIAGNOSIS — Z66 Do not resuscitate: Secondary | ICD-10-CM

## 2021-10-21 DIAGNOSIS — I480 Paroxysmal atrial fibrillation: Secondary | ICD-10-CM | POA: Diagnosis not present

## 2021-10-21 DIAGNOSIS — Z950 Presence of cardiac pacemaker: Secondary | ICD-10-CM | POA: Diagnosis not present

## 2021-10-21 DIAGNOSIS — R9431 Abnormal electrocardiogram [ECG] [EKG]: Secondary | ICD-10-CM | POA: Diagnosis not present

## 2021-10-21 DIAGNOSIS — R55 Syncope and collapse: Secondary | ICD-10-CM | POA: Diagnosis not present

## 2021-10-21 LAB — GLUCOSE, CAPILLARY: Glucose-Capillary: 85 mg/dL (ref 70–99)

## 2021-10-21 NOTE — Progress Notes (Signed)
Discharge instructions reviewed with patient and patients daughter.  Patient and daughter verbalized understanding of discharge instructions. Patient discharged home with daughter in stable condition.   ?

## 2021-10-21 NOTE — Progress Notes (Addendum)
? ?Progress Note ? ?Patient Name: Max Cole ?Date of Encounter: 10/21/2021 ? ?[facility] Cardiologist: Cristopher Peru, MD  ? ?Subjective  ? ?Feels back to baseline this morning. No recurrent dizziness. No chest pain or palpitations. ? ?Inpatient Medications  ?  ?Scheduled Meds: ? apixaban  2.5 mg Oral BID  ? carvedilol  6.25 mg Oral BID WC  ? ferrous sulfate  325 mg Oral Q breakfast  ? furosemide  20 mg Oral Daily  ? pravastatin  20 mg Oral QPM  ? sodium chloride flush  3 mL Intravenous Q12H  ? sodium chloride flush  3 mL Intravenous Q12H  ? ?Continuous Infusions: ? sodium chloride    ? diltiazem (CARDIZEM) infusion Stopped (10/20/21 1714)  ? ?PRN Meds: ?sodium chloride, acetaminophen **OR** acetaminophen, bisacodyl, hydrALAZINE, HYDROmorphone (DILAUDID) injection, ipratropium, levalbuterol, ondansetron **OR** ondansetron (ZOFRAN) IV, oxyCODONE, senna-docusate, sodium chloride flush, traZODone  ? ?Vital Signs  ?  ?Vitals:  ? 10/20/21 2232 10/20/21 2349 10/21/21 0458 10/21/21 0500  ?BP: (!) 109/59 131/67 (!) 145/59   ?Pulse: 78 63 64   ?Resp: '18 18 18   '$ ?Temp:  98.2 ?F (36.8 ?C) 98.7 ?F (37.1 ?C)   ?TempSrc:      ?SpO2: 100% 99% 98%   ?Weight:    75.6 kg  ?Height:      ? ? ?Intake/Output Summary (Last 24 hours) at 10/21/2021 9622 ?Last data filed at 10/21/2021 0500 ?Gross per 24 hour  ?Intake 940 ml  ?Output 200 ml  ?Net 740 ml  ? ? ?  10/21/2021  ?  5:00 AM 10/20/2021  ? 11:56 AM 09/21/2021  ?  3:45 AM  ?Last 3 Weights  ?Weight (lbs) 166 lb 10.7 oz 165 lb 169 lb 15.6 oz  ?Weight (kg) 75.6 kg 74.844 kg 77.1 kg  ?   ? ?Telemetry  ?  ?V-paced, HR in 60's to 70's.  - Personally Reviewed ? ?ECG  ?  ?No new tracings.  ? ?Physical Exam  ? ?GEN: Pleasant elderly male appearing in no acute distress.   ?Neck: No JVD ?Cardiac: RRR, no murmurs, rubs, or gallops.  ?Respiratory: Clear to auscultation bilaterally without wheezing or rales. ?GI: Soft, nontender, non-distended  ?MS: No pitting edema; No deformity. ?Neuro:   Nonfocal  ?Psych: Normal affect  ? ?Labs  ?  ?Chemistry ?Recent Labs  ?Lab 10/20/21 ?1220  ?NA 138  ?K 4.1  ?CL 101  ?CO2 28  ?GLUCOSE 127*  ?BUN 26*  ?CREATININE 1.63*  ?CALCIUM 9.1  ?GFRNONAA 40*  ?ANIONGAP 9  ?  ? ?Hematology ?Recent Labs  ?Lab 10/20/21 ?1220  ?WBC 10.4  ?RBC 3.46*  ?HGB 11.2*  ?HCT 34.4*  ?MCV 99.4  ?MCH 32.4  ?MCHC 32.6  ?RDW 12.6  ?PLT 286  ? ? ?Radiology  ?  ?CT Head Wo Contrast ? ?Result Date: 10/20/2021 ?CLINICAL DATA:  [place] while working out with weights striking RIGHT side of head, denies loss of consciousness, head and neck trauma. History cardiomyopathy, atrial fibrillation, cerebrovascular disease, prostate cancer EXAM: CT HEAD WITHOUT CONTRAST CT CERVICAL SPINE WITHOUT CONTRAST TECHNIQUE: Multidetector CT imaging of the head and cervical spine was performed following the standard protocol without intravenous contrast. Multiplanar CT image reconstructions of the cervical spine were also generated. RADIATION DOSE REDUCTION: This exam was performed according to the departmental dose-optimization program which includes automated exposure control, adjustment of the mA and/or kV according to patient size and/or use of iterative reconstruction technique. COMPARISON:  None FINDINGS: CT HEAD FINDINGS Brain:  Generalized atrophy. Normal ventricular morphology. No midline shift or mass effect. No intracranial hemorrhage, mass lesion, or evidence of acute infarction. No extra-axial fluid collections. Vascular: Atherosclerotic calcification of internal carotid arteries at skull base Skull: Calvaria intact Sinuses/Orbits: Clear Other: N/A CT CERVICAL SPINE FINDINGS Alignment: Minimal retrolisthesis C3-C4. Remaining alignments normal. Skull base and vertebrae: Osseous demineralization. Skull base intact. Vertebral body heights maintained. Multilevel facet degenerative changes bilaterally. Multilevel disc space narrowing and scattered endplate spur formation. Uncovertebral spurs encroach upon scattered  neural foramina bilaterally. No acute fracture, additional subluxation, or bone destruction. Soft tissues and spinal canal: Prevertebral soft tissues normal thickness. Atherosclerotic calcifications at carotid bifurcations. Disc levels:  Unremarkable Upper chest: Lung apices clear Other: N/A IMPRESSION: Generalized atrophy. No acute intracranial abnormalities. Multilevel degenerative disc and facet disease changes of the cervical spine. No acute cervical spine abnormalities. Electronically Signed   By: Lavonia Dana M.D.   On: 10/20/2021 12:56  ? ?CT Cervical Spine Wo Contrast ? ?Result Date: 10/20/2021 ?CLINICAL DATA:  [place] while working out with weights striking RIGHT side of head, denies loss of consciousness, head and neck trauma. History cardiomyopathy, atrial fibrillation, cerebrovascular disease, prostate cancer EXAM: CT HEAD WITHOUT CONTRAST CT CERVICAL SPINE WITHOUT CONTRAST TECHNIQUE: Multidetector CT imaging of the head and cervical spine was performed following the standard protocol without intravenous contrast. Multiplanar CT image reconstructions of the cervical spine were also generated. RADIATION DOSE REDUCTION: This exam was performed according to the departmental dose-optimization program which includes automated exposure control, adjustment of the mA and/or kV according to patient size and/or use of iterative reconstruction technique. COMPARISON:  None FINDINGS: CT HEAD FINDINGS Brain: Generalized atrophy. Normal ventricular morphology. No midline shift or mass effect. No intracranial hemorrhage, mass lesion, or evidence of acute infarction. No extra-axial fluid collections. Vascular: Atherosclerotic calcification of internal carotid arteries at skull base Skull: Calvaria intact Sinuses/Orbits: Clear Other: N/A CT CERVICAL SPINE FINDINGS Alignment: Minimal retrolisthesis C3-C4. Remaining alignments normal. Skull base and vertebrae: Osseous demineralization. Skull base intact. Vertebral body heights  maintained. Multilevel facet degenerative changes bilaterally. Multilevel disc space narrowing and scattered endplate spur formation. Uncovertebral spurs encroach upon scattered neural foramina bilaterally. No acute fracture, additional subluxation, or bone destruction. Soft tissues and spinal canal: Prevertebral soft tissues normal thickness. Atherosclerotic calcifications at carotid bifurcations. Disc levels:  Unremarkable Upper chest: Lung apices clear Other: N/A IMPRESSION: Generalized atrophy. No acute intracranial abnormalities. Multilevel degenerative disc and facet disease changes of the cervical spine. No acute cervical spine abnormalities. Electronically Signed   By: Lavonia Dana M.D.   On: 10/20/2021 12:56  ? ?US Carotid Bilateral ? ?Result Date: 10/20/2021 ?CLINICAL DATA:  Recent fall, syncope, hyperlipidemia, history of tobacco use EXAM: BILATERAL CAROTID DUPLEX ULTRASOUND TECHNIQUE: Pearline Cables scale imaging, color Doppler and duplex ultrasound were performed of bilateral carotid and vertebral arteries in the neck. COMPARISON:  None. FINDINGS: Criteria: Quantification of carotid stenosis is based on velocity parameters that correlate the residual internal carotid diameter with NASCET-based stenosis levels, using the diameter of the distal internal carotid lumen as the denominator for stenosis measurement. The following velocity measurements were obtained: RIGHT ICA: 90/23 cm/sec CCA: 40/34 cm/sec SYSTOLIC ICA/CCA RATIO:  1.5 ECA: 125 cm/sec LEFT ICA: 281/60 cm/sec CCA: 74/25 cm/sec SYSTOLIC ICA/CCA RATIO:  4.6 ECA: 80 cm/sec RIGHT CAROTID ARTERY: Mild bifurcation atherosclerosis with some calcification. Negative for stenosis, velocity elevation, or turbulent flow. No significant waveform abnormality. Distal ICA tortuosity evident. RIGHT VERTEBRAL ARTERY:  Normal antegrade flow LEFT CAROTID ARTERY: More  severe left proximal ICA mixed echogenicity carotid atherosclerosis. Elevated left ICA velocity up to 281/60  centimeters/second with turbulent flow with spectral broadening. Left ICA stenosis estimated at greater than 70% by ultrasound criteria. LEFT VERTEBRAL ARTERY:  Normal antegrade flow IMPRESSION: Right ICA na

## 2021-10-21 NOTE — Evaluation (Signed)
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 Presented with syncopal episode in the gym.     Patient reports he was in the gym where he felt palpitation and had a syncopal episode.  Patient lost consciousness for few seconds the ground had some skin tear.  Denies any headaches visual changes or asymmetric weaknesses.  Did not have any chest pain. (Per MD note)   Clinical Impression   Agreeable to OT and PT co-evaluation. Pt demonstrates bed mobility and ambulatory transfers with independence. Pt demonstrates WFL B UE strength and coordination. Pt able to ambulate in hall without AD. Pt appears to be at or near baseline levels for mobility and function. Pt is not recommended for further acute OT services and will be discharged to care of nursing staff for remaining length of stay.       Recommendations for follow up therapy are one component of a multi-disciplinary discharge planning process, led by the attending physician.  Recommendations may be updated based on patient status, additional functional criteria and insurance authorization.   Follow Up Recommendations  No OT follow up    Assistance Recommended at Discharge None  Patient can return home with the following      Functional Status Assessment  Patient has not had a recent decline in their functional status  Equipment Recommendations  None recommended by OT    Recommendations for Other Services       Precautions / Restrictions Precautions Precautions: Fall Restrictions Weight Bearing Restrictions: No      Mobility Bed Mobility Overal bed mobility: Independent                  Transfers Overall  transfer level: Independent                        Balance Overall balance assessment: Mild deficits observed, not formally tested                                         ADL either performed or assessed with clinical judgement   ADL Overall ADL's : Independent                                             Vision Baseline Vision/History: 1 Wears glasses Ability to See in Adequate Light: 1 Impaired Patient Visual Report: No change from baseline Vision Assessment?: No apparent visual deficits Additional Comments: other than baseline                Pertinent Vitals/Pain Pain Assessment Pain Assessment: No/denies pain     Hand Dominance Right   Extremity/Trunk Assessment Upper Extremity Assessment Upper Extremity Assessment: Overall WFL for tasks assessed   Lower Extremity Assessment Lower Extremity Assessment: Defer to PT evaluation   Cervical / Trunk Assessment Cervical / Trunk Assessment: Normal   Communication Communication Communication: No difficulties   Cognition Arousal/Alertness: Awake/alert Behavior During Therapy: WFL for tasks assessed/performed Overall Cognitive Status: Within Functional Limits for tasks  assessed                                                        Home Living Family/patient expects to be discharged to:: Private residence Living Arrangements: Spouse/significant other Available Help at Discharge: Family;Available PRN/intermittently Type of Home: House Home Access: Stairs to enter [employer] of Steps: 3 to 4 Entrance Stairs-Rails: Can reach both Home Layout: One level     Bathroom Shower/Tub: Tub/shower unit;Walk-in shower   Bathroom Toilet: Handicapped height Bathroom Accessibility: Yes How Accessible: Accessible via walker Home Equipment: Baker (2 wheels);Cane - single point;BSC/3in1;Shower Cole - built in   Additional Comments: Pt  reports that he assists wife.      Prior Functioning/Environment Prior Level of Function : Independent/Modified Independent             Mobility Comments: Ambualtes without AD; drives ADLs Comments: Independent                      OT Goals(Current goals can be found in the care plan section) Acute Rehab OT Goals Patient Stated Goal: return home  OT Frequency:      Co-evaluation PT/OT/SLP Co-Evaluation/Treatment: Yes Reason for Co-Treatment: To address functional/ADL transfers   OT goals addressed during session: ADL's and self-care      AM-PAC OT "6 Clicks" Daily Activity     Outcome Measure Help from another person eating meals?: None Help from another person taking care of personal grooming?: None Help from another person toileting, which includes using toliet, bedpan, or urinal?: None Help from another person bathing (including washing, rinsing, drying)?: None Help from another person to put on and taking off regular upper body clothing?: None Help from another person to put on and taking off regular lower body clothing?: None 6 Click Score: 24   End of Session    Activity Tolerance: Patient tolerated treatment well Patient left: in chair;with call bell/phone within reach  OT Visit Diagnosis: Unsteadiness on feet (R26.81);Other abnormalities of gait and mobility (R26.89);History of falling (Z91.81)                Time: 8115-7262 OT Time Calculation (min): 14 min Charges:  OT General Charges $OT Visit: 1 Visit OT Evaluation $OT Eval Low Complexity: 1 Low  Max Cole OT, MOT  Max Cole 10/21/2021, 9:18 AM

## 2021-10-21 NOTE — TOC Progression Note (Addendum)
?  Transition of Care (TOC) Screening Note ? ? ?Patient Details  ?Name: SHAWNA KIENER ?Date of Birth: 12/26/1927 ? ? ?Transition of Care (TOC) CM/SW Contact:    ?Shade Flood, LCSW ?Phone Number: ?10/21/2021, 9:09 AM ? ? ? ?Received TOC consult for HH/DME/PCP needs. Will follow up if pt has therapy evals and recommendations are made. ? ?Transition of Care Department [facility]) has reviewed patient and no TOC needs have been identified at this time. We will continue to monitor patient advancement through interdisciplinary progression rounds. If new patient transition needs arise, please place a TOC consult. ? ? ?

## 2021-10-21 NOTE — Discharge Summary (Signed)
Physician Discharge Summary  ?Max Cole NIO:[id number] DOB: 1927/10/14 DOA: 10/20/2021 ? ?PCP: Asencion Noble, MD ? ?Admit date: 10/20/2021 ?Discharge date: 10/21/2021 ? ?Time spent: 45 minutes ? ?Recommendations for Outpatient Follow-up:  ?Primary care physician follow-up in 1 to 2 weeks ?Follow-up with cardiology in 2 weeks as arranged by cardiologist ? ?Discharge Diagnoses:  ?Principal Problem: ?  Syncope ?Active Problems: ?  Chronic a-fib ([place]) ?  Cardiomyopathy, nonischemic ([place]) ?  CKD (chronic kidney disease) stage 3, GFR 30-59 ml/min (HCC) ?  Hyperlipidemia ?  Pacemaker ?  Systolic and diastolic CHF, chronic ([place]) ?  BPH without urinary obstruction ?  History of prostate cancer ?  Chronic respiratory failure with hypoxia (HCC) ? ? ?Discharge Condition: Stable ? ? ?Filed Weights  ? 10/20/21 1156 10/21/21 0500  ?Weight: 74.8 kg 75.6 kg  ? ? 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 Presented with syncopal episode in the gym. ?  ?Patient reports he was in the gym where he felt palpitation and had a syncopal episode.  Patient lost consciousness for few seconds the ground had some skin tear. ?Denies any headaches visual changes or asymmetric weaknesses.  Did not have any chest pain. ?  ?ED evaluation: ?Upon arrival in ED patient was found in A-fib with RVR, with heart rate as high as 180 ---patient was given Cardizem then initiated on Cardizem drip.Marland Kitchen ?Initial EKG revealing heart rate of 165, pacemaker spikes, wide-complex, ?  ?Blood pressure 119/77, pulse 79, temperature 97.9 ?F (36.6 ?C), temperature source Oral, resp. rate 16, height 6' (1.829 m), weight 74.8 kg, SpO2 96 %. ?  ?Skin lacerations were repaired in ED ?Hemoglobin 11.2, hematocrit 34.4, BUN 26, creatinine 1.63, GFR 40 ? ?Hospital Course:  ? ?Patient was  admitted cardiology was consulted he had his pacemaker interrogated which showed indeed A-fib during the event he had a syncopal episode.  Device working normally.  Cardiac echo done with not any change.  He had vascular studies also with no significant change from previous studies.  Cardiology recommended to continue current medications.  Advised to follow-up with Dr. Lovena Le for possible ablation as outpatient until then continue Coreg 6.25 mg twice a day and Eliquis do not milligrams twice a day. ? ?Syncope ?- Likely due to A-fib with RVR-he was working out in the gym ?-CT head/cervical spine was reviewed, chronic degenerative changes with atrophy, negative for any acute abnormalities- ?-Consulting cardiology ?-Interrogating his pacemaker :  ?-Bilateral carotid studies stable 50% R ICA stenosis but over 81% LICA stenosis similar to 2021 continue statin. ?-2D echo preserved EF 65% ? ?Chronic a-fib ([place]) ?-Continue Coreg on Eliquis  ? ?chronic respiratory failure with hypoxia ([place]) ?- Chronic respiratory failure, dependent on 2 L of O2 via nasal cannula ? 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  ?History of prostate cancer ?- In remission, follow-up as an outpatient ?  ?BPH without urinary obstruction ?- Monitoring closely, no signs of retention ?  ?Systolic and diastolic CHF, chronic ([place]) ?-Stable resume chronic diuretic usage ?-Repeat echo as above ?  ?Pacemaker ?- Chronic pacemaker in place for rate control ?-Presented with A-fib with RVR ?-Interrogating pacemaker ?-Cardiology consulted appreciate input ?  ?Hyperlipidemia ?- Continue pravastatin ?  ?CKD (chronic kidney disease) stage 3, GFR 30-59 ml/min (HCC) ?Baseline creatinine 1.3- 1.4 ?  ?  Cardiomyopathy, nonischemic ([place]) ?Last echo 02/07/2020 EJ EF 50-55% LV low normal function, mild LVH, grade 1 diastolic dysfunction, LA severely dilated, RA moderately dilated, no significant regurgitation  ?-Continue current home medications   ? ?Discharge Exam: ?Vitals:  ? 10/20/21 2349 10/21/21 0458  ?BP: 131/67 (!) 145/59  ?Pulse: 63 64  ?Resp: 18 18  ?Temp: 98.2 ?F (36.8 ?C) 98.7 ?F (37.1 ?C)  ?SpO2: 99% 98%  ? ? ?General: Alert and oriented ?Cardiovascular: Regular rate and rhythm without murmurs rubs or gallops ?Respiratory: Clear to auscultation bilaterally no wheezes rhonchi rales ? ?Discharge Instructions ? ? ?Discharge Instructions   ? ? Amb Referral to Palliative Care   Complete by: As directed ?  ? Diet - low sodium heart healthy   Complete by: As directed ?  ? Discharge instructions   Complete by: As directed ?  ? Follow-up with primary care physician in 1 to 2 weeks ? ?Follow-up with cardiology as scheduled  ? Increase activity slowly   Complete by: As directed ?  ? ?  ? ?Allergies as of 10/21/2021   ?No Known Allergies ?  ? ?  ?Medication List  ?  ? ?TAKE these medications   ? ?apixaban 2.5 MG Tabs tablet ?Commonly known as: ELIQUIS ?Take 1 tablet (2.5 mg total) by mouth 2 (two) times daily. ?  ?carvedilol 6.25 MG tablet ?Commonly known as: COREG ?Take 1 tablet (6.25 mg total) by mouth 2 (two) times daily with a meal. May take an extra 1/2 tablet for breakthrough afib ?  ?ferrous sulfate 325 (65 FE) MG tablet ?Take 1 tablet (325 mg total) by mouth daily with breakfast. ?  ?furosemide 20 MG tablet ?Commonly known as: LASIX ?Take 1 tablet (20 mg total) by mouth daily. ?  ?HYDROcodone-acetaminophen 5-325 MG tablet ?Commonly known as: NORCO/VICODIN ?Take 1 tablet by mouth 2 (two) times daily. ?  ?OXYGEN ?Inhale 2 L into the lungs continuous. ?  ?pravastatin 20 MG tablet ?Commonly known as: PRAVACHOL ?Take 1 tablet (20 mg total) by mouth every evening. ?  ?ROBAXIN PO ?Take 500 mg by mouth See admin instructions. 1 tab in am and 1/2 tab in pm ?  ? ?  ? ?No Known Allergies ? Follow-up Information   ? ? Max Lance, MD Follow up on 11/03/2021.   ?Specialty: Cardiology ?Why: Cardiology Hospital Follow-up on 11/03/2021 at 1:30 PM. ?Contact  information: ?[address] ?[place] [address] ?[phone] ? ? ?  ?  ? ?  ?  ? ?  ? ? ? ?The results of significant diagnostics from this hospitalization (including imaging, microbiology, ancillary and laboratory) are listed below for reference.   ? ?Significant Diagnostic Studies: ?CT Head Wo Contrast ? ?Result Date: 10/20/2021 ?CLINICAL DATA:  [place] while working out with weights striking RIGHT side of head, denies loss of consciousness, head and neck trauma. History cardiomyopathy, atrial fibrillation, cerebrovascular disease, prostate cancer EXAM: CT HEAD WITHOUT CONTRAST CT CERVICAL SPINE WITHOUT CONTRAST TECHNIQUE: Multidetector CT imaging of the head and cervical spine was performed following the standard protocol without intravenous contrast. Multiplanar CT image reconstructions of the cervical spine were also generated. RADIATION DOSE REDUCTION: This exam was performed according to the departmental dose-optimization program which includes automated exposure control, adjustment of the mA and/or kV according to patient size and/or use of iterative reconstruction technique. COMPARISON:  None FINDINGS: CT HEAD FINDINGS Brain: Generalized atrophy. Normal ventricular morphology. No midline shift or mass effect. No intracranial hemorrhage, mass lesion, or evidence  of acute infarction. No extra-axial fluid collections. Vascular: Atherosclerotic calcification of internal carotid arteries at skull base Skull: Calvaria intact Sinuses/Orbits: Clear Other: N/A CT CERVICAL SPINE FINDINGS Alignment: Minimal retrolisthesis C3-C4. Remaining alignments normal. Skull base and vertebrae: Osseous demineralization. Skull base intact. Vertebral body heights maintained. Multilevel facet degenerative changes bilaterally. Multilevel disc space narrowing and scattered endplate spur formation. Uncovertebral spurs encroach upon scattered neural foramina bilaterally. No acute fracture, additional subluxation, or bone destruction. Soft  tissues and spinal canal: Prevertebral soft tissues normal thickness. Atherosclerotic calcifications at carotid bifurcations. Disc levels:  Unremarkable Upper chest: Lung apices clear Other: N/A IMPRESSION: Genera

## 2021-10-21 NOTE — Evaluation (Addendum)
Physical Therapy Evaluation ?Patient Details ?Name: Max Cole ?MRN: [id number] ?DOB: 01-18-1928 ?Today's Date: 10/21/2021 ? ?History of Present Illness ? DAMARIA STOFKO is a 88 year old male (father of nursing assist at [facility])  With past medical history significant for P-A-fib (on Eliquis), HTN, HLD, chronic anemia CHF, pulmonary fibrosis, O2 dependent 2 L... Presented with syncopal episode in the gym.     Patient reports he was in the gym where he felt palpitation and had a syncopal episode.  Patient lost consciousness for few seconds the ground had some skin tear.  Denies any headaches visual changes or asymmetric weaknesses.  Did not have any chest pain. ?  ?Clinical Impression ? Patient demonstrates ability to perform supine to sit and sit to stand transfers independently and no difficulty reported or observed. Generalized weakness is present in bilateral LE's but falls within expectations of age. Patient is able to ambulate comfortably with supervision and 2L of oxygen with fatigue being the primary factor in ambulating for limited distances demonstrated with sP02 at 90% during ambulation.  Plan:  Patient discharged from physical therapy to care of nursing for ambulation daily as tolerated for length of stay.  ?   ? ?Recommendations for follow up therapy are one component of a multi-disciplinary discharge planning process, led by the attending physician.  Recommendations may be updated based on patient status, additional functional criteria and insurance authorization. ? ?Follow Up Recommendations No PT follow up ? ?  ?Assistance Recommended at Discharge PRN  ?Patient can return home with the following ? A little help with walking and/or transfers;Help with stairs or ramp for entrance ? ?  ?Equipment Recommendations None recommended by PT  ?Recommendations for Other Services ?    ?  ?Functional Status Assessment Patient has had a recent decline in their functional status and/or demonstrates limited ability to  make significant improvements in function in a reasonable and predictable amount of time  ? ?  ?Precautions / Restrictions Precautions ?Precautions: Fall ?Restrictions ?Weight Bearing Restrictions: No  ? ?  ? ?Mobility ? Bed Mobility ?Overal bed mobility: Independent ?  ?Patient performs supine to sit transfers independently ?  ?  ?  ?  ?  ?General bed mobility comments: Patient performs supine to sit transfers independently ?  ? ?Transfers ?Overall transfer level: Independent ?  ?  ?  ?  ?  ?  ?  ?General transfer comment: Patient able to perform transfers safely and independently ?  ? ?Ambulation/Gait ?Ambulation/Gait assistance: Modified independent (Device/Increase time) ? Gait distance ambulated: 100 feet ?Assistive device: None ?Gait Pattern/deviations: WFL(Within Functional Limits) ?Gait velocity: slightly decreased- age appropriate ?  ?  ?General Gait Details: Patient able to ambulate with no assistive device with 2L of oxygen but was limited due to primary c/o of fatigue. Vitals assessed after gait displaying 90% O2 sat, falling in line with fatigue ? ?Stairs ?  ?  ?  ?  ?  ? ?Wheelchair Mobility ?  ? ?Modified Rankin (Stroke Patients Only) ?  ? ?  ? ?Balance Overall balance assessment: Mild deficits observed, not formally tested ?  ?  ?  ?  ?  ?  ?  ?  ?  ?  ?  ?  ?  ?  ?  ?  ?  ?  ?   ? ? ? ?Pertinent Vitals/Pain    ? ? ?Home Living Family/patient expects to be discharged to:: Private residence ?Living Arrangements: Spouse/significant other ?Available Help at Discharge: Family;Available PRN/intermittently ?  Type of Home: House ?Home Access: Stairs to enter ?Entrance Stairs-Rails: Can reach both ?Entrance Stairs-Number of Steps: 3 to 4 ?  ?Home Layout: One level ?Home Equipment: Conservation officer, nature (2 wheels);Cane - single point;BSC/3in1;Shower seat - built in ?Additional Comments: Pt reports that he assists wife. Wife is not physically able to assist patient with all ADL's  ?  ?Prior Function Prior Level of  Function : Independent/Modified Independent ?  ?  ?  ?  ?  ?  ?Mobility Comments: Ambualtes without AD; drives. Patient demonstrates appropriate and safe gait pattern ?ADLs Comments: Independent ?  ? ? ?Hand Dominance  ? Dominant Hand: Right ? ?  ?Extremity/Trunk Assessment  ? Upper Extremity Assessment ?Upper Extremity Assessment: Defer to OT evaluation ?  ? ?Lower Extremity Assessment ?Lower Extremity Assessment: Generalized weakness ?  ? ?Cervical / Trunk Assessment ?Cervical / Trunk Assessment: Normal  ?Communication  ? Communication: No difficulties  ?Cognition Arousal/Alertness: Awake/alert ?Behavior During Therapy: [facility] for tasks assessed/performed ?Overall Cognitive Status: Within Functional Limits for tasks assessed ?  ?  ?  ?  ?  ?  ?  ?  ?  ?  ?  ?  ?  ?  ?  ?  ?  ?  ?  ? ?  ?General Comments   ? ?  ?Exercises    ? ?Assessment/Plan  ?  ?PT Assessment Patient does not need any further PT services  ?PT Problem List   ? ?   ?  ?PT Treatment Interventions     ? ?PT Goals (Current goals can be found in the Care Plan section)  ?Acute Rehab PT Goals ?Patient Stated Goal: return home ?PT Goal Formulation: With patient ?Time For Goal Achievement: 11/04/21 ?Potential to Achieve Goals: Good ? ?  ?Frequency   ?  ? ? ?Co-evaluation PT/OT/SLP Co-Evaluation/Treatment: Yes ?Reason for Co-Treatment: To address functional/ADL transfers ?PT goals addressed during session: Mobility/safety with mobility;Balance;Strengthening/ROM ?OT goals addressed during session: ADL's and self-care ?  ? ? ?  ?AM-PAC PT "6 Clicks" Mobility  ?Outcome Measure Help needed turning from your back to your side while in a flat bed without using bedrails?: None ?Help needed moving from lying on your back to sitting on the side of a flat bed without using bedrails?: None ?Help needed moving to and from a bed to a chair (including a wheelchair)?: None ?Help needed standing up from a chair using your arms (e.g., wheelchair or bedside chair)?: None ?Help  needed to walk in hospital room?: None ?Help needed climbing 3-5 steps with a railing? : A Little ?6 Click Score: 23 ? ?  ?End of Session   ?Activity Tolerance: Patient limited by fatigue ?Patient left: in chair ?Nurse Communication: Mobility status ?PT Visit Diagnosis: Unsteadiness on feet (R26.81);Other abnormalities of gait and mobility (R26.89);Muscle weakness (generalized) (M62.81) ?  ? ?Time: [phone] ?PT Time Calculation (min) (ACUTE ONLY): 13 min ? ? ?Charges:   PT Evaluation ?$PT Eval Low Complexity: 1 Low ?PT Treatments ?$Therapeutic Activity: 8-22 mins ?  ?   ? ? ?12:36 PM, 10/21/21 ?Lestine Box, S/PT  ? ?During this treatment session, the therapist was present, participating in and directing the treatment. ? ?12:36 PM, 10/21/21 ?Lonell Grandchild, MPT ?Physical Therapist with [place] ?[facility] ?[phone] office ?[phone] mobile phone ? ? ?

## 2021-10-21 NOTE — Consult Note (Signed)
? ?                                                                                ?Consultation Note ?Date: 10/21/2021  ? ?Patient Name: Max Cole  ?DOB: 1927/11/11  MRN: [id number]  Age / Sex: 88 y.o., male  ?PCP: Asencion Noble, MD ?Referring Physician: Phillips Grout, MD ? ?Reason for Consultation: Establishing goals of care ? ?HPI/Patient Profile: 88 y.o. male  with past medical history of atrial fibrillation on Eliquis, HTN, HLD, HFpEF, CAD, s/p Chemical engineer CRT-P, chronic anemia, pulmonary fibrosis, oxygen dependent admitted on 10/20/2021 with syncopal episode with palpitations at the gym and found to have atrial fibrillation RVR.  ? ?Clinical Assessment and Goals of Care: ?I met today with Max Cole after reviewing records. He is much more stable and HR has converted back to safe rhythm. Reviewed past palliative consultation from 2021. Max Cole shares with me the burdens of aging and worsening health. He is doing very well for his age but he still feels the limitations of his health on what he really wants to do. He also shares about his wife who has dementia and the struggles of seeing her mentation decline. We discussed his own care and also the importance of planning ahead to avoid caregiver strain trying to care for his wife (on top of his own health issues). He shares that they have 4 children and 2 daughters locally that are extremely close and helpful. He shares that they have ongoing conversations regarding plans for the future.  ? ?Max Cole endorses desire for DNR and says he has DNR form at home. He is hopeful for the best but would not want to be prolonged without quality of life. MOST form in electronic record continues to reflect his wishes. He wishes to be cremated. He talks of having friends and family that have passed on and this has limited how he spends his time as they are not able to get together and play cards as they used too as their group members lessen. Otherwise he  is thankful for his family and that he has done as well as he has over the years. He is still hopeful to get out in his yard and do some work as he enjoys this. He plans to enquire about portable oxygen he can take with him to mow his yard.  ? ?All questions/concerns addressed. Emotional support provided.  ? ?Primary Decision Maker ?PATIENT ?  ? ?SUMMARY OF RECOMMENDATIONS   ?- He is doing very well at his age ?- Very realistic and wants to treat reversible conditions ?- Does not want quantity without quality ? ?Code Status/Advance Care Planning: ?DNR - verified ? ? ?Symptom Management:  ?Per attending, cardiology ? ?Prognosis:  ?Difficult to determine.  ? ?Discharge Planning: Home with Home Health  ? ?  ? ?Primary Diagnoses: ?Present on Admission: ? Syncope ? Chronic a-fib ([place]) ? CKD (chronic kidney disease) stage 3, GFR 30-59 ml/min (HCC) ? Hyperlipidemia ? Systolic and diastolic CHF, chronic ([place]) ? BPH without urinary obstruction ? Chronic respiratory failure with hypoxia (HCC) ? Pacemaker ? ? ?I have reviewed the medical record, interviewed the patient  and family, and examined the patient. The following aspects are pertinent. ? ?Past Medical History:  ?Diagnosis Date  ? Atrial fibrillation ([place])   ? Bradycardia   ? Cardiomyopathy 06/28/2003  ? a. Possibly alcoholic; b. cath in 1610-96% ostial D1, PCW of 12, EF of 35-40%;  c. EF of 0.25 in 11/2003;  d. 40-45% in 05/2004;  e. 25% in 10/2008 by echo;  f. 04/2015 Echo: EF 40-45%, diff HK, sev inflat/inf HK, Gr 1 DD, mild AI, triv TR.  ? Carotid artery disease ([place])   ? CKD (chronic kidney disease) stage 3, GFR 30-59 ml/min (HCC)   ? Hyperlipidemia   ? Left bundle branch block   ? Prostate carcinoma ([place])   ? Syncope   ? [place] CRT-P 07/03/15 Dr. Lovena Le  ? ?Social History  ? ?Socioeconomic History  ? Marital status: Married  ?  Spouse name: Not on file  ? Number of children: Not on file  ? Years of education: Not on file  ? Highest education level: Not on file   ?Occupational History  ? Not on file  ?Tobacco Use  ? Smoking status: Former  ?  Packs/day: 1.00  ?  Years: 30.00  ?  Pack years: 30.00  ?  Types: Cigarettes  ?  Quit date: 09/28/1973  ?  Years since quitting: 48.0  ? Smokeless tobacco: Never  ?Vaping Use  ? Vaping Use: Never used  ?Substance and Sexual Activity  ? Alcohol use: Yes  ?  Alcohol/week: 5.0 standard drinks  ?  Types: 5 Glasses of wine per week  ?  Comment: History of excessive alcohol use; Alternates between glass of wine vs. Liquor drink 5 days per week (04/2015)  ? Drug use: No  ? Sexual activity: Not on file  ?Other Topics Concern  ? Not on file  ?Social History Narrative  ? Not on file  ? ?Social Determinants of Health  ? ?Financial Resource Strain: Not on file  ?Food Insecurity: Not on file  ?Transportation Needs: Not on file  ?Physical Activity: Not on file  ?Stress: Not on file  ?Social Connections: Not on file  ? ?Family History  ?Problem Relation Age of Onset  ? Hypertension Mother   ? ?Scheduled Meds: ? apixaban  2.5 mg Oral BID  ? carvedilol  6.25 mg Oral BID WC  ? ferrous sulfate  325 mg Oral Q breakfast  ? furosemide  20 mg Oral Daily  ? pravastatin  20 mg Oral QPM  ? sodium chloride flush  3 mL Intravenous Q12H  ? sodium chloride flush  3 mL Intravenous Q12H  ? ?Continuous Infusions: ? sodium chloride    ? diltiazem (CARDIZEM) infusion Stopped (10/20/21 1714)  ? ?PRN Meds:.sodium chloride, acetaminophen **OR** acetaminophen, bisacodyl, hydrALAZINE, HYDROmorphone (DILAUDID) injection, ipratropium, levalbuterol, ondansetron **OR** ondansetron (ZOFRAN) IV, oxyCODONE, senna-docusate, sodium chloride flush, traZODone ?No Known Allergies ?Review of Systems  ?Constitutional:  Positive for activity change. Negative for appetite change.  ? ?Physical Exam ?Vitals and nursing note reviewed.  ?Constitutional:   ?   General: He is not in acute distress. ?   Appearance: Normal appearance. He is well-developed and well-groomed.  ?Cardiovascular:  ?   Rate  and Rhythm: Normal rate and regular rhythm.  ?   Comments: V paced ?Pulmonary:  ?   Effort: No tachypnea, accessory muscle usage or respiratory distress.  ?   Comments: 2L chronically ?Abdominal:  ?   General: Abdomen is flat.  ?Neurological:  ?   Mental  Status: He is alert and oriented to person, place, and time.  ? ? ?Vital Signs: BP (!) 145/59 (BP Location: Left Arm)   Pulse 64   Temp 98.7 ?F (37.1 ?C)   Resp 18   Ht 6' (1.829 m)   Wt 75.6 kg   SpO2 98%   BMI 22.60 kg/m?  ?Pain Scale: 0-10 ?  ?Pain Score: 0-No pain ? ? ?SpO2: SpO2: 98 % ?O2 Device:SpO2: 98 % ?O2 Flow Rate: .O2 Flow Rate (L/min): 2 L/min ? ?IO: Intake/output summary:  ?Intake/Output Summary (Last 24 hours) at 10/21/2021 0859 ?Last data filed at 10/21/2021 0500 ?Gross per 24 hour  ?Intake 940 ml  ?Output 200 ml  ?Net 740 ml  ? ? ?LBM: Last BM Date : 10/20/21 ?Baseline Weight: Weight: 74.8 kg ?Most recent weight: Weight: 75.6 kg     ?Palliative Assessment/Data: ? ? ? ? ?Time In: 1000 ? ?Time Total: 80 min ? ?Greater than 50%  of this time was spent counseling and coordinating care related to the above assessment and plan. ? ?Signed by: ?Vinie Sill, NP ?Palliative Medicine Team ?Pager # [phone] (M-F 8a-5p) ?Team Phone # [phone] (Nights/Weekends) ?  ? ? ? ? ? ? ? ? ? ? ? ? ?  ?

## 2021-10-21 NOTE — TOC Transition Note (Signed)
Transition of Care (TOC) - CM/SW Discharge Note ? ? ?Patient Details  ?Name: Max Cole ?MRN: [id number] ?Date of Birth: March 09, 1928 ? ?Transition of Care (TOC) CM/SW Contact:  ?Iona Beard, LCSWA ?Phone Number: ?10/21/2021, 11:10 AM ? ? ?Clinical Narrative:    ?CSW updated that pt is in need of [facility] PT services. CSW spoke with pt in room about this and he is agreeable to services. Pt does not have a preference in company. CSW reached out to [facility] with [facility] who accepts pts referral. MD placed [place] orders. TOC signing off.  ? ?Final next level of care: [place] ?Barriers to Discharge: Barriers Resolved ? ? ?Patient Goals and CMS Choice ?Patient states their goals for this hospitalization and ongoing recovery are:: Home with HH ?CMS Medicare.gov Compare Post Acute Care list provided to:: Patient ?Choice offered to / list presented to : Patient ? ?Discharge Placement ?  ?           ?  ?  ?  ?  ? ?Discharge Plan and Services ?  ?  ?           ?  ?  ?  ?  ?  ?HH Arranged: PT ?[place] Agency: [place] ?Date HH Agency Contacted: 10/21/21 ?  ?Representative spoke with at [place]: Tommi Rumps ? ?Social Determinants of Health (SDOH) Interventions ?  ? ? ?Readmission Risk Interventions ?   ? View : No data to display.  ?  ?  ?  ? ? ? ? ? ?

## 2021-10-25 DIAGNOSIS — B078 Other viral warts: Secondary | ICD-10-CM | POA: Diagnosis not present

## 2021-10-25 DIAGNOSIS — Z85828 Personal history of other malignant neoplasm of skin: Secondary | ICD-10-CM | POA: Diagnosis not present

## 2021-10-25 DIAGNOSIS — Z08 Encounter for follow-up examination after completed treatment for malignant neoplasm: Secondary | ICD-10-CM | POA: Diagnosis not present

## 2021-10-25 DIAGNOSIS — L308 Other specified dermatitis: Secondary | ICD-10-CM | POA: Diagnosis not present

## 2021-10-26 ENCOUNTER — Other Ambulatory Visit (HOSPITAL_COMMUNITY): Payer: Self-pay | Admitting: Nurse Practitioner

## 2021-10-26 DIAGNOSIS — I4891 Unspecified atrial fibrillation: Secondary | ICD-10-CM | POA: Diagnosis not present

## 2021-10-26 DIAGNOSIS — M25551 Pain in right hip: Secondary | ICD-10-CM | POA: Diagnosis not present

## 2021-10-26 DIAGNOSIS — R55 Syncope and collapse: Secondary | ICD-10-CM | POA: Diagnosis not present

## 2021-11-01 ENCOUNTER — Ambulatory Visit (INDEPENDENT_AMBULATORY_CARE_PROVIDER_SITE_OTHER): Payer: Medicare HMO

## 2021-11-01 DIAGNOSIS — I428 Other cardiomyopathies: Secondary | ICD-10-CM | POA: Diagnosis not present

## 2021-11-01 LAB — CUP PACEART REMOTE DEVICE CHECK
Battery Remaining Longevity: 60 mo
Battery Remaining Percentage: 66 %
Brady Statistic RA Percent Paced: 11 %
Brady Statistic RV Percent Paced: 96 %
Date Time Interrogation Session: 20230508110000
Implantable Lead Implant Date: 20170106
Implantable Lead Implant Date: 20170106
Implantable Lead Implant Date: 20170106
Implantable Lead Location: 753858
Implantable Lead Location: 753859
Implantable Lead Location: 753860
Implantable Lead Model: 4677
Implantable Lead Model: 7741
Implantable Lead Model: 7742
Implantable Lead Serial Number: 507382
Implantable Lead Serial Number: 695539
Implantable Lead Serial Number: 724660
Implantable Pulse Generator Implant Date: 20170106
Lead Channel Impedance Value: 601 Ohm
Lead Channel Impedance Value: 633 Ohm
Lead Channel Impedance Value: 740 Ohm
Lead Channel Pacing Threshold Amplitude: 0.9 V
Lead Channel Pacing Threshold Amplitude: 1 V
Lead Channel Pacing Threshold Pulse Width: 0.4 ms
Lead Channel Pacing Threshold Pulse Width: 0.8 ms
Lead Channel Setting Pacing Amplitude: 2 V
Lead Channel Setting Pacing Amplitude: 2 V
Lead Channel Setting Pacing Amplitude: 2.2 V
Lead Channel Setting Pacing Pulse Width: 0.4 ms
Lead Channel Setting Pacing Pulse Width: 0.8 ms
Lead Channel Setting Sensing Sensitivity: 2.5 mV
Lead Channel Setting Sensing Sensitivity: 2.5 mV
Pulse Gen Serial Number: 714587

## 2021-11-02 ENCOUNTER — Telehealth: Payer: Self-pay

## 2021-11-02 DIAGNOSIS — S41101A Unspecified open wound of right upper arm, initial encounter: Secondary | ICD-10-CM | POA: Diagnosis not present

## 2021-11-02 NOTE — Telephone Encounter (Signed)
Patient has upcoming visit with Dr. Lovena Le.  He was previously cleared for the same procedure in Nov last year by holding Eliquis for 3 days prior to the procedure.  Unfortunately, patient recently presented to the hospital with possible vasovagal syncope. ? ?Will defer final cardiac clearance to MD during office visit tomorrow ?

## 2021-11-02 NOTE — Telephone Encounter (Signed)
? ?  Pre-operative Risk Assessment  ?  ?Patient Name: Max Cole  ?DOB: [birth year] ?MRN: [id number]  ? ?  ? ?Request for Surgical Clearance   ? ?Procedure:   LUMBAR SPINAL INJECTION  ? ?Date of Surgery:  Clearance TBD                              ?   ?Surgeon:  DR. DAVE EICHMAN ?Surgeon's Group or Practice Name:  [place] ?Phone number:  [phone] ?Fax number:  [phone] ?  ?Type of Clearance Requested:   ?- Pharmacy:  Hold Apixaban (Eliquis) X 3 DAYS PRIOR AND RESUME IMMEDIATELY AFTER ?  ?Type of Anesthesia:  Not Indicated ?  ?Additional requests/questions:   ? ?Signed, ?Jacinta Shoe   ?11/02/2021, 11:45 AM  ? ?

## 2021-11-03 ENCOUNTER — Encounter: Payer: Self-pay | Admitting: Internal Medicine

## 2021-11-03 ENCOUNTER — Ambulatory Visit: Payer: Medicare HMO | Admitting: Internal Medicine

## 2021-11-03 VITALS — BP 110/60 | HR 74 | Ht 72.0 in | Wt 170.0 lb

## 2021-11-03 DIAGNOSIS — I480 Paroxysmal atrial fibrillation: Secondary | ICD-10-CM

## 2021-11-03 NOTE — Progress Notes (Addendum)
? ? ? ? ?HPI ?Max Cole returns for ongoing followup. He is a pleasant 88 year old man with a history of chronic systolic heart failure, paroxysmal atrial fibrillation who has been fairly well controlled on amiodarone therapy.  ?Unfortunately he developed lung toxicity within 3 months of starting the amio. He denies palpitations. He has not had recurrent palps and he has started back exercising. He checks his pulse ox and he will drop into the mid 80's but he has now gotten off of the steroid and his activity has normalized. He is on home oxygen.  ? ?No Known Allergies ? ? ?Current Outpatient Medications  ?Medication Sig Dispense Refill  ? apixaban (ELIQUIS) 2.5 MG TABS tablet Take 1 tablet (2.5 mg total) by mouth 2 (two) times daily. 60 tablet 0  ? carvedilol (COREG) 6.25 MG tablet TAKE (1) TABLET BY MOUTH TWICE DAILY. MAY TAKE EXTRA (1/2) TABLET FOR BREAKTHROUGH AFIB 75 tablet 0  ? ferrous sulfate 325 (65 FE) MG tablet Take 1 tablet (325 mg total) by mouth daily with breakfast. 30 tablet 0  ? furosemide (LASIX) 20 MG tablet Take 1 tablet (20 mg total) by mouth daily. 30 tablet 0  ? HYDROcodone-acetaminophen (NORCO/VICODIN) 5-325 MG tablet Take 1 tablet by mouth 2 (two) times daily.    ? Methocarbamol (ROBAXIN PO) Take 500 mg by mouth See admin instructions. 1 tab in am and 1/2 tab in pm    ? OXYGEN Inhale 2 L into the lungs continuous.    ? pravastatin (PRAVACHOL) 20 MG tablet Take 1 tablet (20 mg total) by mouth every evening. 30 tablet 0  ? ?No current facility-administered medications for this visit.  ? ? ? ?Past Medical History:  ?Diagnosis Date  ? Atrial fibrillation ([place])   ? Bradycardia   ? Cardiomyopathy 06/28/2003  ? a. Possibly alcoholic; b. cath in 7371-06% ostial D1, PCW of 12, EF of 35-40%;  c. EF of 0.25 in 11/2003;  d. 40-45% in 05/2004;  e. 25% in 10/2008 by echo;  f. 04/2015 Echo: EF 40-45%, diff HK, sev inflat/inf HK, Gr 1 DD, mild AI, triv TR.  ? Carotid artery disease ([place])   ? CKD (chronic  kidney disease) stage 3, GFR 30-59 ml/min (HCC)   ? Hyperlipidemia   ? Left bundle branch block   ? Prostate carcinoma ([place])   ? Syncope   ? [place] CRT-P 07/03/15 Dr. Lovena Le  ? ? ?ROS: ? ? All systems reviewed and negative except as noted in the HPI. ? ? ?Past Surgical History:  ?Procedure Laterality Date  ? CATARACT EXTRACTION    ? Right  ? COLONOSCOPY N/A 08/16/2012  ? Procedure: COLONOSCOPY;  Surgeon: Rogene Houston, MD;  Location: [facility]O [facility];  Service: Endoscopy;  Laterality: N/A;  930  ? COLONOSCOPY N/A 10/21/2015  ? Procedure: COLONOSCOPY;  Surgeon: Rogene Houston, MD;  Location: [facility];  Service: Endoscopy;  Laterality: N/A;  1200  ? COLONOSCOPY W/ POLYPECTOMY  2010  ? Diverticulosis  ? EP IMPLANTABLE DEVICE N/A 07/03/2015  ? Procedure: BiV Pacemaker Insertion CRT-P;  Surgeon: Evans Lance, MD;  Location: [facility];  Service: Cardiovascular;  Laterality: N/A;  ? EYE SURGERY Right   ? Cataract  ? HERNIA REPAIR    ? ? ? ?Family History  ?Problem Relation Age of Onset  ? Hypertension Mother   ? ? ? ?Social History  ? ?Socioeconomic History  ? Marital status: Married  ?  Spouse name: Not on file  ?  Number of children: Not on file  ? Years of education: Not on file  ? Highest education level: Not on file  ?Occupational History  ? Not on file  ?Tobacco Use  ? Smoking status: Former  ?  Packs/day: 1.00  ?  Years: 30.00  ?  Pack years: 30.00  ?  Types: Cigarettes  ?  Quit date: 09/28/1973  ?  Years since quitting: 48.1  ? Smokeless tobacco: Never  ?Vaping Use  ? Vaping Use: Never used  ?Substance and Sexual Activity  ? Alcohol use: Yes  ?  Alcohol/week: 5.0 standard drinks  ?  Types: 5 Glasses of wine per week  ?  Comment: History of excessive alcohol use; Alternates between glass of wine vs. Liquor drink 5 days per week (04/2015)  ? Drug use: No  ? Sexual activity: Not on file  ?Other Topics Concern  ? Not on file  ?Social History Narrative  ? Not on file  ? ?Social Determinants of Health   ? ?Financial Resource Strain: Not on file  ?Food Insecurity: Not on file  ?Transportation Needs: Not on file  ?Physical Activity: Not on file  ?Stress: Not on file  ?Social Connections: Not on file  ?Intimate Partner Violence: Not on file  ? ? ? ?BP 110/60   Pulse 74   Ht 6' (1.829 m)   Wt 170 lb (77.1 kg)   SpO2 97%   BMI 23.06 kg/m?  ? ?Physical Exam: ? ?Well appearing NAD ?HEENT: Unremarkable ?Neck:  No JVD, no thyromegally ?Lymphatics:  No adenopathy ?Back:  No CVA tenderness ?Lungs:  Clear ?HEART:  Regular rate rhythm, no murmurs, no rubs, no clicks ?Abd:  soft, positive bowel sounds, no organomegally, no rebound, no guarding ?Ext:  2 plus pulses, no edema, no cyanosis, no clubbing ?Skin:  No rashes no nodules ?Neuro:  CN II through XII intact, motor grossly intact ? ?Assess/Plan:  ?1. Atrial fib -he is maintaining NSR. He will undergo watchful waiting and when he goes back to atrial fib we will consider AV node ablation.  ?2. Chronic systolic heart failure - he has class 2 symptoms.  ?3. amio lung tox - he is doing well. No chest pain. Dyspnea is improved. He is off of the steroids. ?4. PPM - his [place] Sci biv ppm is working normally. ?  ?Salome Spotted ? ?Addend: ?He may hold his systemic anti-coagulation for up to 3 days prior to any surgical procedure and restart when the bleeding risk is acceptable to the physician performing the procedure. ? ?Carleene Overlie Yarden Manuelito,MD ?

## 2021-11-03 NOTE — Patient Instructions (Signed)
Medication Instructions:  Your physician recommends that you continue on your current medications as directed. Please refer to the Current Medication list given to you today.  *If you need a refill on your cardiac medications before your next appointment, please call your pharmacy*   Lab Work: NONE   If you have labs (blood work) drawn today and your tests are completely normal, you will receive your results only by: MyChart Message (if you have MyChart) OR A paper copy in the mail If you have any lab test that is abnormal or we need to change your treatment, we will call you to review the results.   Testing/Procedures: NONE    Follow-Up: At [facility], you and your health needs are our priority.  As part of our continuing mission to provide you with exceptional heart care, we have created designated Provider Care Teams.  These Care Teams include your primary Cardiologist (physician) and Advanced Practice Providers (APPs -  Physician Assistants and Nurse Practitioners) who all work together to provide you with the care you need, when you need it.  We recommend signing up for the patient portal called "MyChart".  Sign up information is provided on this After Visit Summary.  MyChart is used to connect with patients for Virtual Visits (Telemedicine).  Patients are able to view lab/test results, encounter notes, upcoming appointments, etc.  Non-urgent messages can be sent to your provider as well.   To learn more about what you can do with MyChart, go to [email or web address].    Your next appointment:   6 month(s)  The format for your next appointment:   In Person  Provider:   Gregg Taylor, MD    Other Instructions Thank you for choosing [facility]!    Important Information About [place]       

## 2021-11-08 NOTE — Telephone Encounter (Signed)
Surgical clearance documented. ?

## 2021-11-09 DIAGNOSIS — M25551 Pain in right hip: Secondary | ICD-10-CM | POA: Diagnosis not present

## 2021-11-11 NOTE — Telephone Encounter (Signed)
   Patient Name: Max Cole  DOB: 1927-08-12 MRN: [id number]  Primary Cardiologist: Cristopher Peru, MD  Chart reviewed as part of pre-operative protocol coverage. Pt saw Dr. Lovena Le 5/10 for clearance, per his recommendation, "He may hold his systemic anti-coagulation for up to 3 days prior to any surgical procedure and restart when the bleeding risk is acceptable to the physician performing the procedure." Will route this bundled recommendation and office note to requesting provider via Epic fax function. Please call with questions.  Charlie Pitter, PA-C 11/11/2021, 8:45 AM

## 2021-11-17 NOTE — Progress Notes (Signed)
Remote pacemaker transmission.   

## 2021-12-03 NOTE — Telephone Encounter (Signed)
    Patient Name: Max Cole  DOB: 1928/03/13 MRN: [id number]  Primary Cardiologist: Cristopher Peru, MD  Chart reviewed as part of pre-operative protocol coverage.   Was cleared for procedure by Melina Copa, PA on 11/11/2021.  I have re-routed this recommendation to the requesting party via Epic fax function. I will remove from pre-op pool.  Please call with questions.  Lenna Sciara, NP 12/03/2021, 4:18 PM

## 2021-12-03 NOTE — Telephone Encounter (Signed)
Lauren from [facility] is requesting this clearance be faxed again.  Fax# [phone].

## 2021-12-07 DIAGNOSIS — M5416 Radiculopathy, lumbar region: Secondary | ICD-10-CM | POA: Diagnosis not present

## 2021-12-08 ENCOUNTER — Other Ambulatory Visit: Payer: Self-pay | Admitting: Internal Medicine

## 2021-12-23 ENCOUNTER — Telehealth: Payer: Self-pay | Admitting: Pulmonary Disease

## 2021-12-23 ENCOUNTER — Ambulatory Visit (INDEPENDENT_AMBULATORY_CARE_PROVIDER_SITE_OTHER): Payer: Medicare HMO | Admitting: Physician Assistant

## 2021-12-23 ENCOUNTER — Ambulatory Visit (HOSPITAL_COMMUNITY)
Admission: RE | Admit: 2021-12-23 | Discharge: 2021-12-23 | Disposition: A | Discharge status: Home / Self Care | Payer: Medicare HMO | Source: Ambulatory Visit | Attending: Pulmonary Disease | Admitting: Pulmonary Disease

## 2021-12-23 VITALS — BP 104/72 | HR 94

## 2021-12-23 DIAGNOSIS — M545 Low back pain, unspecified: Secondary | ICD-10-CM | POA: Diagnosis not present

## 2021-12-23 DIAGNOSIS — J9611 Chronic respiratory failure with hypoxia: Secondary | ICD-10-CM

## 2021-12-23 DIAGNOSIS — G8929 Other chronic pain: Secondary | ICD-10-CM | POA: Diagnosis not present

## 2021-12-23 DIAGNOSIS — Z8546 Personal history of malignant neoplasm of prostate: Secondary | ICD-10-CM

## 2021-12-23 DIAGNOSIS — R0600 Dyspnea, unspecified: Secondary | ICD-10-CM | POA: Diagnosis not present

## 2021-12-23 DIAGNOSIS — N400 Enlarged prostate without lower urinary tract symptoms: Secondary | ICD-10-CM | POA: Diagnosis not present

## 2021-12-23 DIAGNOSIS — M48061 Spinal stenosis, lumbar region without neurogenic claudication: Secondary | ICD-10-CM

## 2021-12-23 LAB — URINALYSIS, ROUTINE W REFLEX MICROSCOPIC
Bilirubin, UA: NEGATIVE
Glucose, UA: NEGATIVE
Ketones, UA: NEGATIVE
Leukocytes,UA: NEGATIVE
Nitrite, UA: NEGATIVE
Protein,UA: NEGATIVE
RBC, UA: NEGATIVE
Specific Gravity, UA: 1.01 (ref 1.005–1.030)
Urobilinogen, Ur: 0.2 mg/dL (ref 0.2–1.0)
pH, UA: 6 (ref 5.0–7.5)

## 2021-12-23 LAB — BLADDER SCAN AMB NON-IMAGING: Scan Result: 47

## 2021-12-23 NOTE — Progress Notes (Signed)
12/23/2021 4:00 PM   Max Cole [birth year] [id number]   Assessment:  1. BPH without urinary obstruction - BLADDER SCAN AMB NON-IMAGING - Urinalysis, Routine w reflex microscopic  2. Chronic low back pain without sciatica, unspecified back pain laterality  3. History of prostate cancer  4. Spinal stenosis of lumbar region, unspecified whether neurogenic claudication present    Plan: Recommended pt continue Robaxin and Norco and FU with Pain mgmnt on 7/10 as scheduled. Discussed differential diagnoses of pt's back pain including musculoskeletal causes including degenerative disc disease and spondylosis for which he is already being treated. Potential causes including metastatic disease discussed with the patient and his daughter.  Offered to order imaging studies to rule this out, they are reluctant to pursue this avenue of work-up as he would not pursue treatment even if found to have malignant lesions.  They discussed possible outcomes and have elected to follow-up with pain management next week as already scheduled and see his primary care provider and orthopedics as planned.  Should the patient elect to proceed with further evaluation of his prostate cancer progression or potential metastatic disease, he will let us know.  Chief Complaint: No chief complaint on file.   Referring provider: Asencion Noble, MD [address] [place],  [place] [address]   History of Present Illness:  Max Cole is a 88 y.o. year old male with h/o prostate CA without history of treatment who is seen for evaluation of progressive low back pain over the past few weeks.  He presents with his daughter who works in the [place] at [employer].  Patient reports a history of chronic low back pain with compression fractures and degenerative changes diagnosed in the past.  Imaging studies from 1 year ago indicated multiple compression fractures and spinal stenosis of the lumbar spine.  Pain is currently  controlled with Norco and Robaxin previously prescribed in office visit with pain management is pending.  He denies recent injury and reports pain has progressed since he was hospitalized on 10/20/2021 following a syncopal episode while he was at the gym.  Patient and his daughter are seeking evaluation to deliberate whether imaging or urology work-up is indicated for his discomfort.  He reports he was diagnosed with prostate cancer approximately 20 years ago and chose to have no treatment because of slow progression of the disease.  He reports minimal LUTS, denies hematuria, incomplete emptying, dysuria, burning.  The patient's pain does not radiate to the abdomen or into the groin and he reports no scrotal or inguinal pain.  Patient denies history of urinary stones.  He is anticoagulated on Eliquis for chronic atrial fibrillation and also has a history of cardiomyopathy, CKD, CHF, BPH without obstruction, chronic hypoxia, O2 dependent. PVR 47m Urine clear Medical records from recent admission including labs, imaging studies, provider and nursing notes reviewed during the patient's office visit. Portions of the above documentation were copied from a prior visit for review purposes only  Past Medical History:  Past Medical History:  Diagnosis Date   Atrial fibrillation ([place]    Bradycardia    Cardiomyopathy 06/28/2003   a. Possibly alcoholic; b. cath [place] [address]-78%ostial D1, PCW of 12, EF of 35-40%;  c. EF of 0.25 in 11/2003;  d. 40-45% in 05/2004;  e. 25% in 10/2008 by echo;  f. 04/2015 Echo: EF 40-45%, diff HK, sev inflat/inf HK, Gr 1 DD, mild AI, triv TR.   Carotid artery disease (HCC)    CKD (chronic  kidney disease) stage 3, GFR 30-59 ml/min (HCC)    Hyperlipidemia    Left bundle branch block    Prostate carcinoma [facility])    Syncope    [place] CRT-P 07/03/15 Dr. Lovena Le    Past Surgical History:  Past Surgical History:  Procedure Laterality Date   CATARACT EXTRACTION     Right   COLONOSCOPY N/A  08/16/2012   Procedure: COLONOSCOPY;  Surgeon: Rogene Houston, MD;  [facility];  Service: Endoscopy;  Laterality: N/A;  930   COLONOSCOPY N/A 10/21/2015   Procedure: COLONOSCOPY;  Surgeon: Rogene Houston, MD;  [facility];  Service: Endoscopy;  Laterality: N/A;  1200   COLONOSCOPY W/ POLYPECTOMY  2010   Diverticulosis   EP IMPLANTABLE DEVICE N/A 07/03/2015   Procedure: BiV Pacemaker Insertion CRT-P;  Surgeon: Evans Lance, MD;  Location: [facility];  Service: Cardiovascular;  Laterality: N/A;   EYE SURGERY Right    Cataract   HERNIA REPAIR      Allergies:  No Known Allergies  Family History:  Family History  Problem Relation Age of Onset   Hypertension Mother     Social History:  Social History   Tobacco Use   Smoking status: Former    Packs/day: 1.00    Years: 30.00    Total pack years: 30.00    Types: Cigarettes    Quit date: 09/28/1973    Years since quitting: 48.2   Smokeless tobacco: Never  Vaping Use   Vaping Use: Never used  Substance Use Topics   Alcohol use: Yes    Alcohol/week: 5.0 standard drinks of alcohol    Types: 5 Glasses of wine per week    Comment: History of excessive alcohol use; Alternates between glass of wine vs. Liquor drink 5 days per week (04/2015)   Drug use: No    Review of symptoms:  Constitutional:  Negative for fever, chills ENT:  Negative for nose bleeds, sinus pain, painful swallowing CV:  Negative for chest pain Resp:  Negative for change in chronic cough GI:  Negative for nausea, vomiting, diarrhea, bloody stools GU:  Positives noted in HPI; otherwise negative for gross hematuria, dysuria, urinary incontinence Neuro:  Negative for seizures, slurred speech Psych:  Negative for depression, anxiety Endocrine:  Negative for polydipsia, polyuria, symptoms of hypoglycemia (dizziness, hunger, sweating) Hematologic:  Negative for anemia, purpura, petechia, prolonged or excessive bleeding.  Physical  Exam: BP 104/72   Pulse 94   Constitutional:  Alert and oriented, No acute distress. HEENT: NCAT, moist mucus membranes.  Trachea midline, no masses. Cardiovascular: Regular rate and rhythm without murmur, rub, or gallops Respiratory: Pt on [place] O2, Normal respiratory effort, clear to auscultation bilaterally GI: Abdomen is soft, nontender, nondistended, no abdominal masses BACK: Diffuse tenderness along the lumbar soft tissues, lumbar spinous processes, SI joints bilaterally.  No deformity or spasm appreciated.  No CVA tenderness.  Skin: No obvious rashes, warm, dry Neurologic: Alert and oriented Psychiatric: Appropriate. Normal mood and affect.  Laboratory Data: Results for orders placed or performed in visit on 12/23/21 (from the past 24 hour(s))  BLADDER SCAN AMB NON-IMAGING   Collection Time: 12/23/21  3:44 PM  Result Value Ref Range   Scan Result 47     Lab Results  Component Value Date   WBC 10.4 10/20/2021   HGB 11.2 (L) 10/20/2021   HCT 34.4 (L) 10/20/2021   MCV 99.4 10/20/2021   PLT 286 10/20/2021    Lab  Results  Component Value Date   CREATININE 1.63 (H) 10/20/2021    Urinalysis    Component Value Date/Time   COLORURINE YELLOW 06/08/2020 1114   APPEARANCEUR CLEAR 06/08/2020 1114   LABSPEC 1.014 06/08/2020 1114   PHURINE 7.0 06/08/2020 1114   GLUCOSEU NEGATIVE 06/08/2020 1114   GLUCOSEU neg 02/26/2009 0000   HGBUR NEGATIVE 06/08/2020 1114   BILIRUBINUR NEGATIVE 06/08/2020 1114   KETONESUR NEGATIVE 06/08/2020 1114   PROTEINUR NEGATIVE 06/08/2020 1114   NITRITE NEGATIVE 06/08/2020 1114   LEUKOCYTESUR NEGATIVE 06/08/2020 1114    Lab Results  Component Value Date   BACTERIA NONE SEEN 05/22/2020    Pertinent Imaging: No results found for this or any previous visit.  No results found for this or any previous visit.     [place], Berneice Heinrich, PA-C [facility] [place]

## 2021-12-23 NOTE — Telephone Encounter (Signed)
Okay to order chest xray.  I can call him with results.

## 2021-12-23 NOTE — Telephone Encounter (Signed)
Called and spoke to patients daughter Max Cole and she states that patient is having left side flank pain and it goes up his left side. She states she is concerned because he says the pain in worse when he takes a deep breath. She denies noticing any change in his breathing and states he is still using the 2LO2 as usual and has not had to increase. She states he has an appt today with his urologist at 3:30. She wants Dr. Halford Chessman to be made aware of what is going on and is looking for reassurance that this is not breathing related. She also mentioned that patient has not had a cxr in a long time and would like to know if a CXR would be recommended. She is also waiting on a call back from pcp. Please advise. Marking as high priority since daughter is requesting a call back on Dr. Collie Siad thoughts today.

## 2021-12-23 NOTE — Telephone Encounter (Signed)
Called and notified patients daughter and she voiced understanding. She states she does not think patients problem is breathing related but wants to have the CXR done. Order placed and she is aware they will be called with results. Nothing further needed.

## 2021-12-23 NOTE — Telephone Encounter (Signed)
Wanted to make sure it wasn't lung related.  Pt has appt 01/04/22 with Dr. Halford Chessman.  Please advise.

## 2022-01-04 ENCOUNTER — Ambulatory Visit: Payer: Medicare HMO | Admitting: Pulmonary Disease

## 2022-01-04 ENCOUNTER — Encounter: Payer: Self-pay | Admitting: Pulmonary Disease

## 2022-01-04 VITALS — BP 130/78 | HR 72 | Temp 98.0°F | Ht 72.0 in | Wt 168.2 lb

## 2022-01-04 DIAGNOSIS — J984 Other disorders of lung: Secondary | ICD-10-CM | POA: Diagnosis not present

## 2022-01-04 DIAGNOSIS — J9611 Chronic respiratory failure with hypoxia: Secondary | ICD-10-CM

## 2022-01-04 DIAGNOSIS — T462X5A Adverse effect of other antidysrhythmic drugs, initial encounter: Secondary | ICD-10-CM

## 2022-01-04 NOTE — Progress Notes (Signed)
[name]  Chief Complaint  Patient presents with   Follow-up    Feels breathing is about the same since last OV.  Decided not to do ONO     Constitutional:  BP 130/78 (BP Location: Left Arm, Patient Position: Sitting)   Pulse 72   Temp 98 F (36.7 C) (Temporal)   Ht 6' (1.829 m)   Wt 168 lb 3.2 oz (76.3 kg)   SpO2 93% Comment: 2LO2 pulse  BMI 22.81 kg/m   Past Medical History:  Systolic CHF status post BiV PPM, CKD 3a, Carotid stenosis, HLD, PAF, HTN  Past Surgical History:  He  has a past surgical history that includes Cataract extraction; Colonoscopy w/ polypectomy (2010); Colonoscopy (N/A, 08/16/2012); Eye surgery (Right); Cardiac catheterization (N/A, 07/03/2015); Hernia repair; and Colonoscopy (N/A, 10/21/2015).  Brief Summary:  Max Cole is a 88 y.o. 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      Subjective:   He is here with his daughter.  He had flank pain last month.  Chest xray looked stable.  He was started on muscle relaxant and feels better.  Using 2 liters oxygen with exertion and sleep.  SpO2 stays > 92%.  Not having cough, wheeze, sputum, or leg swelling.  Gets dizzy when he first stands up, but feels okay after few seconds.  Physical Exam:   Appearance - well kempt, wearing oxygen  ENMT - no sinus tenderness, no oral exudate, no LAN, Mallampati 3 airway, no stridor  Respiratory - equal breath sounds bilaterally, no wheezing or rales  CV - s1s2 regular rate and rhythm, no murmurs  Ext - no clubbing, no edema  Skin - no rashes  Psych - normal mood and affect      Pulmonary testing:  ANA 05/24/20 >> negative A1AT 06/01/20 >> 159  Chest Imaging:  CT angio chest 05/22/20 >> widespread, symmetric, peripheral GGO  Cardiac Tests:  Echo 02/07/20 >> EF 50 to 55%, grade 1 DD, mild elevation in PASP, severe LA dilation, mod RA dilation  Social History:   He  reports that he quit smoking about 48 years ago. His smoking use included cigarettes. He has a 30.00 pack-year smoking history. He has never used smokeless tobacco. He reports current alcohol use of about 5.0 standard drinks of alcohol per week. He reports that he does not use drugs.  Family History:  His family history includes Hypertension in his mother.     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 - has been off prednisone since January 2022 - goal SpO2 > 90% - 2 liters oxygen with exertion and sleep  Atrial fibrillation, chronic systolic CHF. - followed by Dr. Cristopher Peru with [facility]  Goals of care. - DNR/DNI  Time Spent Involved in Patient Care on Day of Examination:  26 minutes  Follow up:   Patient Instructions  Follow up in 1 year  Medication List:   Allergies as of 01/04/2022   No Known Allergies      Medication List        Accurate as of January 04, 2022 10:46 AM. If you have any questions, ask your nurse or doctor.          apixaban 2.5 MG Tabs tablet Commonly known as: ELIQUIS Take 1 tablet (2.5 mg total) by mouth 2 (two) times daily.   carvedilol 6.25 MG tablet Commonly known as: COREG TAKE (1)  TABLET BY MOUTH TWICE DAILY. MAY TAKE EXTRA (1/2) TABLET FOR BREAKTHROUGH AFIB   ferrous sulfate 325 (65 FE) MG tablet Take 1 tablet (325 mg total) by mouth daily with breakfast.   furosemide 20 MG tablet Commonly known as: LASIX Take 1 tablet (20 mg total) by mouth daily.   HYDROcodone-acetaminophen 5-325 MG tablet Commonly known as: NORCO/VICODIN Take 1 tablet by mouth 2 (two) times daily.   OXYGEN Inhale 2 L into the lungs continuous.   pravastatin 20 MG tablet Commonly known as: PRAVACHOL Take 1 tablet (20 mg total) by mouth every evening.   ROBAXIN PO Take 500 mg by mouth See admin instructions. 1 tab in am and 1/2 tab in pm        Signature:  Chesley Mires, MD [place] Pager - [phone] 01/04/2022, 10:46 AM

## 2022-01-04 NOTE — Patient Instructions (Signed)
Follow up in 1 year.

## 2022-01-09 DIAGNOSIS — I5042 Chronic combined systolic (congestive) and diastolic (congestive) heart failure: Secondary | ICD-10-CM | POA: Diagnosis not present

## 2022-01-09 DIAGNOSIS — N1832 Chronic kidney disease, stage 3b: Secondary | ICD-10-CM | POA: Diagnosis not present

## 2022-01-09 DIAGNOSIS — J9621 Acute and chronic respiratory failure with hypoxia: Secondary | ICD-10-CM | POA: Diagnosis not present

## 2022-01-09 DIAGNOSIS — J9611 Chronic respiratory failure with hypoxia: Secondary | ICD-10-CM | POA: Diagnosis not present

## 2022-01-12 DIAGNOSIS — M47816 Spondylosis without myelopathy or radiculopathy, lumbar region: Secondary | ICD-10-CM | POA: Diagnosis not present

## 2022-01-20 DIAGNOSIS — I5022 Chronic systolic (congestive) heart failure: Secondary | ICD-10-CM | POA: Diagnosis not present

## 2022-01-20 DIAGNOSIS — N1832 Chronic kidney disease, stage 3b: Secondary | ICD-10-CM | POA: Diagnosis not present

## 2022-01-20 DIAGNOSIS — J9611 Chronic respiratory failure with hypoxia: Secondary | ICD-10-CM | POA: Diagnosis not present

## 2022-01-20 DIAGNOSIS — Z79899 Other long term (current) drug therapy: Secondary | ICD-10-CM | POA: Diagnosis not present

## 2022-01-20 DIAGNOSIS — D500 Iron deficiency anemia secondary to blood loss (chronic): Secondary | ICD-10-CM | POA: Diagnosis not present

## 2022-01-27 DIAGNOSIS — I5022 Chronic systolic (congestive) heart failure: Secondary | ICD-10-CM | POA: Diagnosis not present

## 2022-01-27 DIAGNOSIS — N1832 Chronic kidney disease, stage 3b: Secondary | ICD-10-CM | POA: Diagnosis not present

## 2022-01-27 DIAGNOSIS — M545 Low back pain, unspecified: Secondary | ICD-10-CM | POA: Diagnosis not present

## 2022-01-27 DIAGNOSIS — J449 Chronic obstructive pulmonary disease, unspecified: Secondary | ICD-10-CM | POA: Diagnosis not present

## 2022-01-27 DIAGNOSIS — D649 Anemia, unspecified: Secondary | ICD-10-CM | POA: Diagnosis not present

## 2022-01-27 DIAGNOSIS — N183 Chronic kidney disease, stage 3 unspecified: Secondary | ICD-10-CM | POA: Diagnosis not present

## 2022-01-31 ENCOUNTER — Ambulatory Visit (INDEPENDENT_AMBULATORY_CARE_PROVIDER_SITE_OTHER): Payer: Medicare HMO

## 2022-01-31 DIAGNOSIS — I480 Paroxysmal atrial fibrillation: Secondary | ICD-10-CM | POA: Diagnosis not present

## 2022-02-01 LAB — CUP PACEART REMOTE DEVICE CHECK
Battery Remaining Longevity: 54 mo
Battery Remaining Percentage: 61 %
Brady Statistic RA Percent Paced: 14 %
Brady Statistic RV Percent Paced: 96 %
Date Time Interrogation Session: 20230807083300
Implantable Lead Implant Date: 20170106
Implantable Lead Implant Date: 20170106
Implantable Lead Implant Date: 20170106
Implantable Lead Location: 753858
Implantable Lead Location: 753859
Implantable Lead Location: 753860
Implantable Lead Model: 4677
Implantable Lead Model: 7741
Implantable Lead Model: 7742
Implantable Lead Serial Number: 507382
Implantable Lead Serial Number: 695539
Implantable Lead Serial Number: 724660
Implantable Pulse Generator Implant Date: 20170106
Lead Channel Impedance Value: 597 Ohm
Lead Channel Impedance Value: 601 Ohm
Lead Channel Impedance Value: 712 Ohm
Lead Channel Pacing Threshold Amplitude: 1 V
Lead Channel Pacing Threshold Amplitude: 1 V
Lead Channel Pacing Threshold Pulse Width: 0.4 ms
Lead Channel Pacing Threshold Pulse Width: 0.8 ms
Lead Channel Setting Pacing Amplitude: 2 V
Lead Channel Setting Pacing Amplitude: 2.2 V
Lead Channel Setting Pacing Amplitude: 2.2 V
Lead Channel Setting Pacing Pulse Width: 0.4 ms
Lead Channel Setting Pacing Pulse Width: 0.8 ms
Lead Channel Setting Sensing Sensitivity: 2.5 mV
Lead Channel Setting Sensing Sensitivity: 2.5 mV
Pulse Gen Serial Number: 714587

## 2022-02-09 DIAGNOSIS — N1832 Chronic kidney disease, stage 3b: Secondary | ICD-10-CM | POA: Diagnosis not present

## 2022-02-09 DIAGNOSIS — J9621 Acute and chronic respiratory failure with hypoxia: Secondary | ICD-10-CM | POA: Diagnosis not present

## 2022-02-09 DIAGNOSIS — J9611 Chronic respiratory failure with hypoxia: Secondary | ICD-10-CM | POA: Diagnosis not present

## 2022-02-09 DIAGNOSIS — I5042 Chronic combined systolic (congestive) and diastolic (congestive) heart failure: Secondary | ICD-10-CM | POA: Diagnosis not present

## 2022-02-17 DIAGNOSIS — M47816 Spondylosis without myelopathy or radiculopathy, lumbar region: Secondary | ICD-10-CM | POA: Diagnosis not present

## 2022-02-18 DIAGNOSIS — M542 Cervicalgia: Secondary | ICD-10-CM | POA: Diagnosis not present

## 2022-02-18 DIAGNOSIS — M9902 Segmental and somatic dysfunction of thoracic region: Secondary | ICD-10-CM | POA: Diagnosis not present

## 2022-02-18 DIAGNOSIS — M9901 Segmental and somatic dysfunction of cervical region: Secondary | ICD-10-CM | POA: Diagnosis not present

## 2022-03-02 NOTE — Progress Notes (Signed)
Remote pacemaker transmission.   

## 2022-03-21 DIAGNOSIS — M47816 Spondylosis without myelopathy or radiculopathy, lumbar region: Secondary | ICD-10-CM | POA: Diagnosis not present

## 2022-04-05 DIAGNOSIS — M47816 Spondylosis without myelopathy or radiculopathy, lumbar region: Secondary | ICD-10-CM | POA: Diagnosis not present

## 2022-05-02 ENCOUNTER — Ambulatory Visit (INDEPENDENT_AMBULATORY_CARE_PROVIDER_SITE_OTHER): Payer: Medicare HMO

## 2022-05-02 DIAGNOSIS — D500 Iron deficiency anemia secondary to blood loss (chronic): Secondary | ICD-10-CM | POA: Diagnosis not present

## 2022-05-02 DIAGNOSIS — I480 Paroxysmal atrial fibrillation: Secondary | ICD-10-CM | POA: Diagnosis not present

## 2022-05-02 DIAGNOSIS — N1832 Chronic kidney disease, stage 3b: Secondary | ICD-10-CM | POA: Diagnosis not present

## 2022-05-02 DIAGNOSIS — Z79899 Other long term (current) drug therapy: Secondary | ICD-10-CM | POA: Diagnosis not present

## 2022-05-02 DIAGNOSIS — I5022 Chronic systolic (congestive) heart failure: Secondary | ICD-10-CM | POA: Diagnosis not present

## 2022-05-03 LAB — CUP PACEART REMOTE DEVICE CHECK
Battery Remaining Longevity: 48 mo
Battery Remaining Percentage: 52 %
Brady Statistic RA Percent Paced: 15 %
Brady Statistic RV Percent Paced: 96 %
Date Time Interrogation Session: 20231106101900
Implantable Lead Connection Status: 753985
Implantable Lead Connection Status: 753985
Implantable Lead Connection Status: 753985
Implantable Lead Implant Date: 20170106
Implantable Lead Implant Date: 20170106
Implantable Lead Implant Date: 20170106
Implantable Lead Location: 753858
Implantable Lead Location: 753859
Implantable Lead Location: 753860
Implantable Lead Model: 4677
Implantable Lead Model: 7741
Implantable Lead Model: 7742
Implantable Lead Serial Number: 507382
Implantable Lead Serial Number: 695539
Implantable Lead Serial Number: 724660
Implantable Pulse Generator Implant Date: 20170106
Lead Channel Impedance Value: 584 Ohm
Lead Channel Impedance Value: 601 Ohm
Lead Channel Impedance Value: 668 Ohm
Lead Channel Pacing Threshold Amplitude: 0.9 V
Lead Channel Pacing Threshold Amplitude: 1 V
Lead Channel Pacing Threshold Pulse Width: 0.4 ms
Lead Channel Pacing Threshold Pulse Width: 0.8 ms
Lead Channel Setting Pacing Amplitude: 2 V
Lead Channel Setting Pacing Amplitude: 2 V
Lead Channel Setting Pacing Amplitude: 2.2 V
Lead Channel Setting Pacing Pulse Width: 0.4 ms
Lead Channel Setting Pacing Pulse Width: 0.8 ms
Lead Channel Setting Sensing Sensitivity: 2.5 mV
Lead Channel Setting Sensing Sensitivity: 2.5 mV
Pulse Gen Serial Number: 714587
Zone Setting Status: 755011

## 2022-05-05 DIAGNOSIS — R079 Chest pain, unspecified: Secondary | ICD-10-CM | POA: Diagnosis not present

## 2022-05-05 DIAGNOSIS — M545 Low back pain, unspecified: Secondary | ICD-10-CM | POA: Diagnosis not present

## 2022-05-05 DIAGNOSIS — I5022 Chronic systolic (congestive) heart failure: Secondary | ICD-10-CM | POA: Diagnosis not present

## 2022-05-05 DIAGNOSIS — N183 Chronic kidney disease, stage 3 unspecified: Secondary | ICD-10-CM | POA: Diagnosis not present

## 2022-05-10 ENCOUNTER — Ambulatory Visit: Payer: Medicare HMO | Attending: Internal Medicine | Admitting: Internal Medicine

## 2022-05-10 ENCOUNTER — Encounter: Payer: Self-pay | Admitting: Internal Medicine

## 2022-05-10 VITALS — BP 124/58 | HR 85 | Ht 71.0 in | Wt 176.0 lb

## 2022-05-10 DIAGNOSIS — I428 Other cardiomyopathies: Secondary | ICD-10-CM | POA: Diagnosis not present

## 2022-05-10 DIAGNOSIS — I5022 Chronic systolic (congestive) heart failure: Secondary | ICD-10-CM

## 2022-05-10 LAB — CUP PACEART INCLINIC DEVICE CHECK
Date Time Interrogation Session: 20231114150514
Implantable Lead Connection Status: 753985
Implantable Lead Connection Status: 753985
Implantable Lead Connection Status: 753985
Implantable Lead Implant Date: 20170106
Implantable Lead Implant Date: 20170106
Implantable Lead Implant Date: 20170106
Implantable Lead Location: 753858
Implantable Lead Location: 753859
Implantable Lead Location: 753860
Implantable Lead Model: 4677
Implantable Lead Model: 7741
Implantable Lead Model: 7742
Implantable Lead Serial Number: 507382
Implantable Lead Serial Number: 695539
Implantable Lead Serial Number: 724660
Implantable Pulse Generator Implant Date: 20170106
Lead Channel Impedance Value: 621 Ohm
Lead Channel Impedance Value: 645 Ohm
Lead Channel Impedance Value: 679 Ohm
Lead Channel Pacing Threshold Amplitude: 0.7 V
Lead Channel Pacing Threshold Amplitude: 1 V
Lead Channel Pacing Threshold Amplitude: 1 V
Lead Channel Pacing Threshold Pulse Width: 0.4 ms
Lead Channel Pacing Threshold Pulse Width: 0.4 ms
Lead Channel Pacing Threshold Pulse Width: 0.8 ms
Lead Channel Sensing Intrinsic Amplitude: 11.4 mV
Lead Channel Sensing Intrinsic Amplitude: 15.9 mV
Lead Channel Sensing Intrinsic Amplitude: 3.3 mV
Lead Channel Setting Pacing Amplitude: 2 V
Lead Channel Setting Pacing Amplitude: 2 V
Lead Channel Setting Pacing Amplitude: 2.2 V
Lead Channel Setting Pacing Pulse Width: 0.4 ms
Lead Channel Setting Pacing Pulse Width: 0.8 ms
Lead Channel Setting Sensing Sensitivity: 2.5 mV
Lead Channel Setting Sensing Sensitivity: 2.5 mV
Pulse Gen Serial Number: 714587
Zone Setting Status: 755011

## 2022-05-10 MED ORDER — METOPROLOL TARTRATE 25 MG PO TABS: 25.0000 mg | ORAL_TABLET | Freq: Two times a day (BID) | ORAL | 3 refills | Status: DC

## 2022-05-10 NOTE — Progress Notes (Signed)
HPI [name] returns for ongoing followup. He is a pleasant 88 year old man with a history of chronic systolic heart failure, paroxysmal atrial fibrillation who has been fairly well controlled on amiodarone therapy.  Unfortunately he developed lung toxicity within 3 months of starting the amio. He denies palpitations. He has not had recurrent palps and he has started back exercising. He checks his pulse ox and he will drop into the mid 80's but he has now gotten off of the steroid and his activity has normalized. He is on home oxygen.  No Known Allergies   Current Outpatient Medications  Medication Sig Dispense Refill   apixaban (ELIQUIS) 2.5 MG TABS tablet Take 1 tablet (2.5 mg total) by mouth 2 (two) times daily. 60 tablet 0   carvedilol (COREG) 6.25 MG tablet TAKE (1) TABLET BY MOUTH TWICE DAILY. MAY TAKE EXTRA (1/2) TABLET FOR BREAKTHROUGH AFIB 75 tablet 0   ferrous sulfate 325 (65 FE) MG tablet Take 1 tablet (325 mg total) by mouth daily with breakfast. 30 tablet 0   furosemide (LASIX) 20 MG tablet Take 1 tablet (20 mg total) by mouth daily. 30 tablet 0   HYDROcodone-acetaminophen (NORCO/VICODIN) 5-325 MG tablet Take 1 tablet by mouth 2 (two) times daily.     Methocarbamol (ROBAXIN PO) Take 500 mg by mouth See admin instructions. 1 tab in am and 1/2 tab in pm     OXYGEN Inhale 2 L into the lungs continuous.     pravastatin (PRAVACHOL) 20 MG tablet Take 1 tablet (20 mg total) by mouth every evening. 30 tablet 0   tamsulosin (FLOMAX) 0.4 MG CAPS capsule Take 0.4 mg by mouth daily.     No current facility-administered medications for this visit.     Past Medical History:  Diagnosis Date   Atrial fibrillation ([place])    Bradycardia    Cardiomyopathy 06/28/2003   a. Possibly alcoholic; b. cath in 0867-61% ostial D1, PCW of 12, EF of 35-40%;  c. EF of 0.25 in 11/2003;  d. 40-45% in 05/2004;  e. 25% in 10/2008 by echo;  f. 04/2015 Echo: EF 40-45%, diff HK, sev inflat/inf HK, Gr 1  DD, mild AI, triv TR.   Carotid artery disease (HCC)    CKD (chronic kidney disease) stage 3, GFR 30-59 ml/min (HCC)    Hyperlipidemia    Left bundle branch block    Prostate carcinoma [facility])    Syncope    [place] CRT-P 07/03/15 Dr. Lovena Le    ROS:   All systems reviewed and negative except as noted in the HPI.   Past Surgical History:  Procedure Laterality Date   CATARACT EXTRACTION     Right   COLONOSCOPY N/A 08/16/2012   Procedure: COLONOSCOPY;  Surgeon: Rogene Houston, MD;  [facility];  Service: Endoscopy;  Laterality: N/A;  930   COLONOSCOPY N/A 10/21/2015   Procedure: COLONOSCOPY;  Surgeon: Rogene Houston, MD;  [facility];  Service: Endoscopy;  Laterality: N/A;  1200   COLONOSCOPY W/ POLYPECTOMY  2010   Diverticulosis   EP IMPLANTABLE DEVICE N/A 07/03/2015   Procedure: BiV Pacemaker Insertion CRT-P;  Surgeon: Evans Lance, MD;  Location: [facility];  Service: Cardiovascular;  Laterality: N/A;   EYE SURGERY Right    Cataract   HERNIA REPAIR       Family History  Problem Relation Age of Onset   Hypertension Mother      Social History  Socioeconomic History   Marital status: Married    Spouse name: Not on file   Number of children: Not on file   Years of education: Not on file   Highest education level: Not on file  Occupational History   Not on file  Tobacco Use   Smoking status: Former    Packs/day: 1.00    Years: 30.00    Total pack years: 30.00    Types: Cigarettes    Quit date: 09/28/1973    Years since quitting: 48.6   Smokeless tobacco: Never  Vaping Use   Vaping Use: Never used  Substance and Sexual Activity   Alcohol use: Yes    Alcohol/week: 5.0 standard drinks of alcohol    Types: 5 Glasses of wine per week    Comment: History of excessive alcohol use; Alternates between glass of wine vs. Liquor drink 5 days per week (04/2015)   Drug use: No   Sexual activity: Not on file  Other Topics Concern   Not on file   Social History Narrative   Not on file   Social Determinants of Health   Financial Resource Strain: Not on file  Food Insecurity: Not on file  Transportation Needs: Not on file  Physical Activity: Not on file  Stress: Not on file  Social Connections: Not on file  Intimate Partner Violence: Not on file     BP (!) 124/58   Pulse 85   Ht '5\' 11"'$  (1.803 m)   Wt 176 lb (79.8 kg)   SpO2 95%   BMI 24.55 kg/m   Physical Exam:  Well appearing NAD HEENT: Unremarkable Neck:  No JVD, no thyromegally Lymphatics:  No adenopathy Back:  No CVA tenderness Lungs:  Clear HEART:  Regular rate rhythm, no murmurs, no rubs, no clicks Abd:  soft, positive bowel sounds, no organomegally, no rebound, no guarding Ext:  2 plus pulses, no edema, no cyanosis, no clubbing Skin:  No rashes no nodules Neuro:  CN II through XII intact, motor grossly intact  DEVICE  Normal device function.  See PaceArt for details.   Assess/Plan: 1. Atrial fib -he is maintaining NSR. He will undergo watchful waiting and when he goes back to atrial fib we will consider AV node ablation. I will switch his coreg to toprol. 2. Chronic systolic heart failure - he has class 2 symptoms. I asked him to stop coreg and switch to metoprolol for better rate control.  3. amio lung tox - he is doing well. No chest pain. Dyspnea is improved. He is off of the steroids. 4. PPM - his [place] [facility] biv ppm is working normally.   Carleene Overlie Cally Nygard,MD

## 2022-05-10 NOTE — Patient Instructions (Signed)
Medication Instructions:  Stop Taking Coreg   Start Taking Metoprolol 25 mg Two Times Daily   *If you need a refill on your cardiac medications before your next appointment, please call your pharmacy*   Lab Work: NONE   If you have labs (blood work) drawn today and your tests are completely normal, you will receive your results only by: [place] (if you have MyChart) OR A paper copy in the mail If you have any lab test that is abnormal or we need to change your treatment, we will call you to review the results.   Testing/Procedures: NONE    Follow-Up: At [facility], you and your health needs are our priority.  As part of our continuing mission to provide you with exceptional heart care, we have created designated Provider Care Teams.  These Care Teams include your primary Cardiologist (physician) and Advanced Practice Providers (APPs -  Physician Assistants and Nurse Practitioners) who all work together to provide you with the care you need, when you need it.  We recommend signing up for the patient portal called "MyChart".  Sign up information is provided on this After Visit Summary.  MyChart is used to connect with patients for Virtual Visits (Telemedicine).  Patients are able to view lab/test results, encounter notes, upcoming appointments, etc.  Non-urgent messages can be sent to your provider as well.   To learn more about what you can do with MyChart, go to [email or web address].    Your next appointment:   1 year(s)  The format for your next appointment:   In Person  Provider:   Cristopher Peru, MD    Other Instructions Thank you for choosing [place]!    Important Information About Sugar

## 2022-05-12 DIAGNOSIS — M47816 Spondylosis without myelopathy or radiculopathy, lumbar region: Secondary | ICD-10-CM | POA: Diagnosis not present

## 2022-05-31 NOTE — Progress Notes (Signed)
Remote pacemaker transmission.   

## 2022-08-01 ENCOUNTER — Ambulatory Visit: Payer: Medicare HMO

## 2022-08-01 DIAGNOSIS — I428 Other cardiomyopathies: Secondary | ICD-10-CM | POA: Diagnosis not present

## 2022-08-02 DIAGNOSIS — M5416 Radiculopathy, lumbar region: Secondary | ICD-10-CM | POA: Diagnosis not present

## 2022-08-02 LAB — CUP PACEART REMOTE DEVICE CHECK
Battery Remaining Longevity: 42 mo
Battery Remaining Percentage: 47 %
Brady Statistic RA Percent Paced: 17 %
Brady Statistic RV Percent Paced: 99 %
Date Time Interrogation Session: 20240205172600
Implantable Lead Connection Status: 753985
Implantable Lead Connection Status: 753985
Implantable Lead Connection Status: 753985
Implantable Lead Implant Date: 20170106
Implantable Lead Implant Date: 20170106
Implantable Lead Implant Date: 20170106
Implantable Lead Location: 753858
Implantable Lead Location: 753859
Implantable Lead Location: 753860
Implantable Lead Model: 4677
Implantable Lead Model: 7741
Implantable Lead Model: 7742
Implantable Lead Serial Number: 507382
Implantable Lead Serial Number: 695539
Implantable Lead Serial Number: 724660
Implantable Pulse Generator Implant Date: 20170106
Lead Channel Impedance Value: 620 Ohm
Lead Channel Impedance Value: 623 Ohm
Lead Channel Impedance Value: 673 Ohm
Lead Channel Pacing Threshold Amplitude: 0.9 V
Lead Channel Pacing Threshold Amplitude: 1 V
Lead Channel Pacing Threshold Pulse Width: 0.4 ms
Lead Channel Pacing Threshold Pulse Width: 0.8 ms
Lead Channel Setting Pacing Amplitude: 2 V
Lead Channel Setting Pacing Amplitude: 2 V
Lead Channel Setting Pacing Amplitude: 2.2 V
Lead Channel Setting Pacing Pulse Width: 0.4 ms
Lead Channel Setting Pacing Pulse Width: 0.8 ms
Lead Channel Setting Sensing Sensitivity: 2.5 mV
Lead Channel Setting Sensing Sensitivity: 2.5 mV
Pulse Gen Serial Number: 714587
Zone Setting Status: 755011

## 2022-08-04 ENCOUNTER — Encounter (HOSPITAL_COMMUNITY): Payer: Self-pay | Admitting: *Deleted

## 2022-08-15 DIAGNOSIS — I5022 Chronic systolic (congestive) heart failure: Secondary | ICD-10-CM | POA: Diagnosis not present

## 2022-08-15 DIAGNOSIS — N1832 Chronic kidney disease, stage 3b: Secondary | ICD-10-CM | POA: Diagnosis not present

## 2022-08-15 DIAGNOSIS — I480 Paroxysmal atrial fibrillation: Secondary | ICD-10-CM | POA: Diagnosis not present

## 2022-08-15 DIAGNOSIS — M5416 Radiculopathy, lumbar region: Secondary | ICD-10-CM | POA: Diagnosis not present

## 2022-08-15 DIAGNOSIS — Z79899 Other long term (current) drug therapy: Secondary | ICD-10-CM | POA: Diagnosis not present

## 2022-08-18 DIAGNOSIS — N1832 Chronic kidney disease, stage 3b: Secondary | ICD-10-CM | POA: Diagnosis not present

## 2022-08-18 DIAGNOSIS — M545 Low back pain, unspecified: Secondary | ICD-10-CM | POA: Diagnosis not present

## 2022-08-18 DIAGNOSIS — I5022 Chronic systolic (congestive) heart failure: Secondary | ICD-10-CM | POA: Diagnosis not present

## 2022-08-18 DIAGNOSIS — I480 Paroxysmal atrial fibrillation: Secondary | ICD-10-CM | POA: Diagnosis not present

## 2022-08-18 DIAGNOSIS — Z23 Encounter for immunization: Secondary | ICD-10-CM | POA: Diagnosis not present

## 2022-08-25 ENCOUNTER — Encounter: Payer: Self-pay | Admitting: Radiology

## 2022-09-14 DIAGNOSIS — M5416 Radiculopathy, lumbar region: Secondary | ICD-10-CM | POA: Diagnosis not present

## 2022-09-14 NOTE — Progress Notes (Signed)
Remote pacemaker transmission.   

## 2022-09-27 ENCOUNTER — Telehealth: Payer: Self-pay | Admitting: *Deleted

## 2022-09-27 NOTE — Progress Notes (Signed)
  Care Coordination   Note   09/27/2022 Name: Max Cole MRN: [id number] DOB: [birth year]  Max Cole is a 89 y.o. year old male who sees Asencion Noble, MD for primary care. I reached out to [name] by phone today to offer care coordination services.  [name] was given information about Care Coordination services today including:   The Care Coordination services include support from the care team which includes your Nurse Coordinator, Clinical Social Worker, or Pharmacist.  The Care Coordination team is here to help remove barriers to the health concerns and goals most important to you. Care Coordination services are voluntary, and the patient may decline or stop services at any time by request to their care team member.   Care Coordination Consent Status: Patient daughter POA Max Cole agreed to services and verbal consent obtained.   Follow up plan:  Telephone appointment with care coordination team member scheduled for:  09/30/22  Encounter Outcome:  Pt. Scheduled  [place]  Direct Dial: [phone]

## 2022-09-30 ENCOUNTER — Ambulatory Visit: Payer: Self-pay | Admitting: *Deleted

## 2022-09-30 NOTE — Patient Outreach (Signed)
  Care Coordination   09/30/2022 Name: CLIFTON GETTO MRN: [id number] DOB: [birth year]   Care Coordination Outreach Attempts:  An unsuccessful telephone outreach was attempted for a scheduled appointment today.  Follow Up Plan:  Additional outreach attempts will be made to offer the patient care coordination information and services.   Encounter Outcome:  No Answer. Left HIPAA compliant VM.   Care Coordination Interventions:  No, not indicated    Demetrios Loll, BSN, RN-BC RN Care Coordinator [facility]  [facility] Direct Dial: [phone] Main #: [phone]

## 2022-10-04 ENCOUNTER — Telehealth: Payer: Self-pay | Admitting: *Deleted

## 2022-10-04 NOTE — Progress Notes (Signed)
  Care Coordination Note  10/04/2022 Name: Max Cole MRN: [id number] DOB: 1927/07/27  Max Cole is a 89 y.o. year old male who is a primary care patient of Carylon Perches, MD and is actively engaged with the care management team. I reached out to Carma Leaven by phone today to assist with re-scheduling an initial visit with the RN Case Manager  Follow up plan: Unsuccessful telephone outreach attempt made. A HIPAA compliant phone message was left for the patient providing contact information and requesting a return call.   [facility]  Care Coordination Care Guide  Direct Dial: [phone]

## 2022-10-10 NOTE — Progress Notes (Signed)
  Care Coordination Note  10/10/2022 Name: Max Cole MRN: [id number] DOB: 08-06-1927  Max Cole is a 89 y.o. year old male who is a primary care patient of Carylon Perches, MD and is actively engaged with the care management team. I reached out to Carma Leaven by phone today to assist with re-scheduling an initial visit with the RN Case Manager  Follow up plan: Telephone appointment with care management team member scheduled for:09/11/22  [facility] Coordination Care Guide  Direct Dial: [phone]

## 2022-10-12 ENCOUNTER — Ambulatory Visit: Payer: Self-pay | Admitting: *Deleted

## 2022-10-12 ENCOUNTER — Encounter: Payer: Self-pay | Admitting: *Deleted

## 2022-10-14 NOTE — Patient Outreach (Addendum)
  Care Coordination   Follow Up Visit Note   10/12/2022 Name: Max Cole MRN: [id number] DOB: [birth year]  Max Cole is a 89 y.o. year old male who sees Max Perches, MD for primary care. I spoke with  Max Cole by phone today.  What matters to the patients health and wellness today?  Managing arthritis pain, if it's safe to take NSAIDS with CKD, following up with dermatologist re: skin lesion   Goals Addressed             This Visit's Progress    Follow-up on Skin Lesion       Care Coordination Goals: Patient/daughter will review secure email with in-network dermatologist demographics Patient/daughter will schedule an appointment dermatology to evaluate skin lesion Patient/daughter will reach out to RN Care Coordinator [phone] with any resource or care coordination needs     Manage Arthritis Pain       Care Coordination Goals: Patient/daughter will talk with provider about safe levels of NSAIDs to manage arthritis pain considering he has CKD Patient will take medications as prescribed Patient/daughter will talk with provider about whether adjusting pain medication to manage pain level is appropriate Patient can take additional tylenol. Would recommend staying below  total per day (max daily amount is ) Patient will use assistive devices if needed for ambulation Patient/daughter will reach out to RN Care Coordinator with any resource or care coordination needs         SDOH assessments and interventions completed:  Yes  SDOH Interventions Today    Flowsheet Row Most Recent Value  SDOH Interventions   Housing Interventions Intervention Not Indicated  Transportation Interventions Intervention Not Indicated  Utilities Interventions Intervention Not Indicated  Financial Strain Interventions Intervention Not Indicated  Physical Activity Interventions Intervention Not Indicated        Care Coordination Interventions:  Yes, provided   Interventions Today    Flowsheet Row Most Recent Value  Chronic Disease   Chronic disease during today's visit Other, Chronic Kidney Disease/End Stage Renal Disease (ESRD)  [Arthritis, skin lesion]  General Interventions   General Interventions Discussed/Reviewed General Interventions Discussed, General Interventions Reviewed, Doctor Visits, Health Screening  Doctor Visits Discussed/Reviewed PCP, Doctor Visits Discussed, Doctor Visits Reviewed, Specialist  Health Screening --  [skin check]  PCP/Specialist Visits Compliance with follow-up visit  Exercise Interventions   Exercise Discussed/Reviewed Physical Activity, Exercise Discussed, Exercise Reviewed  Physical Activity Discussed/Reviewed Physical Activity Discussed, Physical Activity Reviewed, Types of exercise, Gym  Education Interventions   Education Provided Provided Education, Provided Printed Education  [facility] list of dermatologists within 40 miles of patient's zip code that participate with [facility]]  Provided Verbal Education On When to see the doctor, Medication, Exercise, Insurance Plans  Pharmacy Interventions   Pharmacy Dicussed/Reviewed Pharmacy Topics Discussed, Pharmacy Topics Reviewed, Medications and their functions  Safety Interventions   Safety Discussed/Reviewed Safety Discussed, Home Safety, Fall Risk, Safety Reviewed       Follow up plan: Follow up call scheduled for 11/11/22    Encounter Outcome:  Pt. Visit Completed   *Note completed later than appointment date

## 2022-10-27 DIAGNOSIS — L720 Epidermal cyst: Secondary | ICD-10-CM | POA: Diagnosis not present

## 2022-10-27 DIAGNOSIS — D485 Neoplasm of uncertain behavior of skin: Secondary | ICD-10-CM | POA: Diagnosis not present

## 2022-10-27 DIAGNOSIS — L821 Other seborrheic keratosis: Secondary | ICD-10-CM | POA: Diagnosis not present

## 2022-10-27 DIAGNOSIS — L905 Scar conditions and fibrosis of skin: Secondary | ICD-10-CM | POA: Diagnosis not present

## 2022-10-27 DIAGNOSIS — C44612 Basal cell carcinoma of skin of right upper limb, including shoulder: Secondary | ICD-10-CM | POA: Diagnosis not present

## 2022-10-27 DIAGNOSIS — L570 Actinic keratosis: Secondary | ICD-10-CM | POA: Diagnosis not present

## 2022-10-31 ENCOUNTER — Ambulatory Visit: Payer: Medicare HMO

## 2022-11-11 ENCOUNTER — Ambulatory Visit: Payer: Self-pay | Admitting: *Deleted

## 2022-11-11 ENCOUNTER — Encounter: Payer: Self-pay | Admitting: *Deleted

## 2022-11-11 NOTE — Patient Outreach (Signed)
  Care Coordination   11/11/2022 Name: Max Cole MRN: [id number] DOB: Jun 13, 1928   Care Coordination Outreach Attempts:  An unsuccessful telephone outreach was attempted for a scheduled appointment today.  Follow Up Plan:  Additional outreach attempts will be made to offer the patient care coordination information and services.   Encounter Outcome:  No Answer   Care Coordination Interventions:  No, not indicated    Demetrios Loll, BSN, RN-BC RN Care Coordinator [facility]  [facility] Direct Dial: [phone] Main #: [phone]

## 2022-11-15 DIAGNOSIS — M5416 Radiculopathy, lumbar region: Secondary | ICD-10-CM | POA: Diagnosis not present

## 2022-11-16 ENCOUNTER — Telehealth: Payer: Self-pay | Admitting: *Deleted

## 2022-11-16 NOTE — Progress Notes (Signed)
  Care Coordination Note  11/16/2022 Name: SLATE BOEDIGHEIMER MRN: [id number] DOB: [birth year]  Max Cole is a 89 y.o. year old male who is a primary care patient of Carylon Perches, MD and is actively engaged with the care management team. I reached out to Carma Leaven by phone today to assist with re-scheduling a follow up visit with the RN Case Manager  Follow up plan: Patient daughter Dillard Cannon declines further follow up and engagement by the care management team. Appropriate care team members and provider have been notified via electronic communication.   [facility]  Care Coordination Care Guide  Direct Dial: [phone]

## 2022-11-16 NOTE — Patient Outreach (Signed)
  Care Coordination   Follow Up Visit Note   11/16/2022 Name: Max Cole MRN: [id number] DOB: 1928/02/01  Max Cole is a 89 y.o. year old male who sees Carylon Perches, MD for primary care. I  collaborated with Care Guide, Gwenevere Ghazi, regarding patient's declination to continue participation in the Care Management program.   What matters to the patients health and wellness today?  Ongoing self management of chronic medical conditions     Goals Addressed             This Visit's Progress    COMPLETED: Care Coordination Services (no follow-up required)       Care Coordination Goals: Patient will follow-up with PCP and/or specialist(s) as recommended Patient will take medications as prescribed Patient will reach out to [facility] Care Management Dept at [phone] with any care coordination or resource needs  Patient will utilize the [facility] 24 Hour Nurse/Concierge Line as needed [phone]      COMPLETED: Follow-up on Skin Lesion       Care Coordination Goals: Patient/daughter will review secure email with in-network dermatologist demographics Patient/daughter will schedule an appointment dermatology to evaluate skin lesion Patient/daughter will reach out to RN Care Coordinator [phone] with any resource or care coordination needs  Goal Closed. Patient no longer wants to participate in Care Management Program.     COMPLETED: Manage Arthritis Pain       Care Coordination Goals: Patient/daughter will talk with provider about safe levels of NSAIDs to manage arthritis pain considering he has CKD Patient will take medications as prescribed Patient/daughter will talk with provider about whether adjusting pain medication to manage pain level is appropriate Patient can take additional tylenol. Would recommend staying below 3200mg  total per day (max daily amount is 4000mg ) Patient will use assistive devices if needed for ambulation Patient/daughter will reach out to RN Care  Coordinator with any resource or care coordination needs  Goal closed. Patient no longer wants to participate in Care Management Program.        SDOH assessments and interventions completed:  No   Care Coordination Interventions:  Yes, provided  Interventions Today    Flowsheet Row Most Recent Value  Chronic Disease   Chronic disease during today's visit Chronic Kidney Disease/End Stage Renal Disease (ESRD)  General Interventions   General Interventions Discussed/Reviewed Communication with  Communication with --  [Scheduling Care Guide. Patient's daughter declined ongoing Care Management services. RNCC removed from Care Team.  Goals closed. Note with [facility] Care Management contact information available in My Chart if they need resource or care coordination services.]       Follow up plan: No further intervention required.   Encounter Outcome:  Pt. Refused   Demetrios Loll, BSN, RN-BC RN Care Coordinator [facility]  [facility] Direct Dial: [phone] Main #: [phone]

## 2022-11-18 DIAGNOSIS — I5022 Chronic systolic (congestive) heart failure: Secondary | ICD-10-CM | POA: Diagnosis not present

## 2022-11-18 DIAGNOSIS — I480 Paroxysmal atrial fibrillation: Secondary | ICD-10-CM | POA: Diagnosis not present

## 2022-11-18 DIAGNOSIS — Z79899 Other long term (current) drug therapy: Secondary | ICD-10-CM | POA: Diagnosis not present

## 2022-11-18 DIAGNOSIS — N1832 Chronic kidney disease, stage 3b: Secondary | ICD-10-CM | POA: Diagnosis not present

## 2022-11-23 DIAGNOSIS — L821 Other seborrheic keratosis: Secondary | ICD-10-CM | POA: Diagnosis not present

## 2022-11-23 DIAGNOSIS — L820 Inflamed seborrheic keratosis: Secondary | ICD-10-CM | POA: Diagnosis not present

## 2022-11-23 DIAGNOSIS — L570 Actinic keratosis: Secondary | ICD-10-CM | POA: Diagnosis not present

## 2022-11-23 DIAGNOSIS — D2272 Melanocytic nevi of left lower limb, including hip: Secondary | ICD-10-CM | POA: Diagnosis not present

## 2022-11-23 DIAGNOSIS — J8410 Pulmonary fibrosis, unspecified: Secondary | ICD-10-CM | POA: Diagnosis not present

## 2022-11-23 DIAGNOSIS — L308 Other specified dermatitis: Secondary | ICD-10-CM | POA: Diagnosis not present

## 2022-11-23 DIAGNOSIS — D485 Neoplasm of uncertain behavior of skin: Secondary | ICD-10-CM | POA: Diagnosis not present

## 2022-11-23 DIAGNOSIS — L578 Other skin changes due to chronic exposure to nonionizing radiation: Secondary | ICD-10-CM | POA: Diagnosis not present

## 2022-11-24 DIAGNOSIS — I5022 Chronic systolic (congestive) heart failure: Secondary | ICD-10-CM | POA: Diagnosis not present

## 2022-11-24 DIAGNOSIS — M545 Low back pain, unspecified: Secondary | ICD-10-CM | POA: Diagnosis not present

## 2022-11-24 DIAGNOSIS — N1832 Chronic kidney disease, stage 3b: Secondary | ICD-10-CM | POA: Diagnosis not present

## 2022-12-05 ENCOUNTER — Ambulatory Visit (INDEPENDENT_AMBULATORY_CARE_PROVIDER_SITE_OTHER): Payer: Medicare HMO

## 2022-12-05 DIAGNOSIS — I428 Other cardiomyopathies: Secondary | ICD-10-CM

## 2022-12-05 LAB — CUP PACEART REMOTE DEVICE CHECK
Battery Remaining Longevity: 30 mo
Battery Remaining Percentage: 34 %
Brady Statistic RA Percent Paced: 13 %
Brady Statistic RV Percent Paced: 99 %
Date Time Interrogation Session: 20240609144400
Implantable Lead Connection Status: 753985
Implantable Lead Connection Status: 753985
Implantable Lead Connection Status: 753985
Implantable Lead Implant Date: 20170106
Implantable Lead Implant Date: 20170106
Implantable Lead Implant Date: 20170106
Implantable Lead Location: 753858
Implantable Lead Location: 753859
Implantable Lead Location: 753860
Implantable Lead Model: 4677
Implantable Lead Model: 7741
Implantable Lead Model: 7742
Implantable Lead Serial Number: 507382
Implantable Lead Serial Number: 695539
Implantable Lead Serial Number: 724660
Implantable Pulse Generator Implant Date: 20170106
Lead Channel Impedance Value: 619 Ohm
Lead Channel Impedance Value: 645 Ohm
Lead Channel Impedance Value: 676 Ohm
Lead Channel Pacing Threshold Amplitude: 1 V
Lead Channel Pacing Threshold Amplitude: 1.1 V
Lead Channel Pacing Threshold Pulse Width: 0.4 ms
Lead Channel Pacing Threshold Pulse Width: 0.8 ms
Lead Channel Setting Pacing Amplitude: 2 V
Lead Channel Setting Pacing Amplitude: 2.2 V
Lead Channel Setting Pacing Amplitude: 2.4 V
Lead Channel Setting Pacing Pulse Width: 0.4 ms
Lead Channel Setting Pacing Pulse Width: 0.8 ms
Lead Channel Setting Sensing Sensitivity: 2.5 mV
Lead Channel Setting Sensing Sensitivity: 2.5 mV
Pulse Gen Serial Number: 714587
Zone Setting Status: 755011

## 2022-12-26 NOTE — Progress Notes (Signed)
Remote pacemaker transmission.   

## 2023-01-02 DIAGNOSIS — M25551 Pain in right hip: Secondary | ICD-10-CM | POA: Diagnosis not present

## 2023-01-18 ENCOUNTER — Telehealth: Payer: Self-pay

## 2023-01-18 ENCOUNTER — Telehealth: Payer: Self-pay | Admitting: *Deleted

## 2023-01-18 NOTE — Telephone Encounter (Signed)
Pharmacy please advise on holding Apixaban prior to Lumbar Epidural Steroid Injection  scheduled for TBD. Thank you.

## 2023-01-18 NOTE — Telephone Encounter (Signed)
Patient with diagnosis of afib on Eliquis for anticoagulation.    Procedure: lumbar ESI Date of procedure: TBD  CHA2DS2-VASc Score = 4  This indicates a 4.8% annual risk of stroke. The patient's score is based upon: CHF History: 1 HTN History: 0 Diabetes History: 0 Stroke History: 0 Vascular Disease History: 1 Age Score: 2 Gender Score: 0   CrCl 66mL/min Platelet count 286K  Per office protocol, patient can hold Eliquis for 3 days prior to procedure.    **This guidance is not considered finalized until pre-operative APP has relayed final recommendations.**

## 2023-01-18 NOTE — Telephone Encounter (Signed)
Spoke with pts daughter(Bexton Haak Daniel-checked DPR). Pt is scheduled for 8/12 at 9:40am for tele preop clearance. Med rec and consent done

## 2023-01-18 NOTE — Telephone Encounter (Signed)
   Pre-operative Risk Assessment    Patient Name: Max Cole  DOB: 21-Sep-1927 MRN: [id number]      Request for Surgical Clearance    Procedure:   Lumbar Epidural Steroid Injection  Date of Surgery:  Clearance TBD                                 Surgeon:  Dr. Aileen Fass Surgeon's Group or Practice Name:  [facility] Phone number:  [phone] Fax number:  [phone]   Type of Clearance Requested:   - Medical  - Pharmacy:  Hold Apixaban (Eliquis) 3 days prior and resume day after.   Type of Anesthesia:  None    Additional requests/questions:    Signed, Emmit Pomfret   01/18/2023, 11:24 AM

## 2023-01-18 NOTE — Telephone Encounter (Signed)
Spoke with pts daughter(Max Cole-checked DPR). Pt is scheduled for 8/12 at 9:40am for tele preop clearance. Med rec and consent done    Patient Consent for Virtual Visit        Max Cole has provided verbal consent on 01/18/2023 for a virtual visit (video or telephone).   CONSENT FOR VIRTUAL VISIT FOR:  Max Cole  By participating in this virtual visit I agree to the following:  I hereby voluntarily request, consent and authorize [facility] and its employed or contracted physicians, physician assistants, nurse practitioners or other licensed health care professionals (the Practitioner), to provide me with telemedicine health care services (the "Services") as deemed necessary by the treating Practitioner. I acknowledge and consent to receive the Services by the Practitioner via telemedicine. I understand that the telemedicine visit will involve communicating with the Practitioner through live audiovisual communication technology and the disclosure of certain medical information by electronic transmission. I acknowledge that I have been given the opportunity to request an in-person assessment or other available alternative prior to the telemedicine visit and am voluntarily participating in the telemedicine visit.  I understand that I have the right to withhold or withdraw my consent to the use of telemedicine in the course of my care at any time, without affecting my right to future care or treatment, and that the Practitioner or I may terminate the telemedicine visit at any time. I understand that I have the right to inspect all information obtained and/or recorded in the course of the telemedicine visit and may receive copies of available information for a reasonable fee.  I understand that some of the potential risks of receiving the Services via telemedicine include:  Delay or interruption in medical evaluation due to technological equipment failure or  disruption; Information transmitted may not be sufficient (e.g. poor resolution of images) to allow for appropriate medical decision making by the Practitioner; and/or  In rare instances, security protocols could fail, causing a breach of personal health information.  Furthermore, I acknowledge that it is my responsibility to provide information about my medical history, conditions and care that is complete and accurate to the best of my ability. I acknowledge that Practitioner's advice, recommendations, and/or decision may be based on factors not within their control, such as incomplete or inaccurate data provided by me or distortions of diagnostic images or specimens that may result from electronic transmissions. I understand that the practice of medicine is not an exact science and that Practitioner makes no warranties or guarantees regarding treatment outcomes. I acknowledge that a copy of this consent can be made available to me via my patient portal [facility] MyChart), or I can request a printed copy by calling the office of [facility].    I understand that my insurance will be billed for this visit.   I have read or had this consent read to me. I understand the contents of this consent, which adequately explains the benefits and risks of the Services being provided via telemedicine.  I have been provided ample opportunity to ask questions regarding this consent and the Services and have had my questions answered to my satisfaction. I give my informed consent for the services to be provided through the use of telemedicine in my medical care

## 2023-01-18 NOTE — Telephone Encounter (Signed)
   Name: Max Cole  DOB: 01/18/1928  MRN: [id number]  Primary Cardiologist: Lewayne Bunting, MD   Preoperative team, please contact this patient and set up a phone call appointment for further preoperative risk assessment. Please obtain consent and complete medication review. Thank you for your help.  I confirm that guidance regarding antiplatelet and oral anticoagulation therapy has been completed and, if necessary, noted below.  Per Pharmacy:  Per office protocol, patient can hold Eliquis for 3 days prior to procedure.    Joni Reining, NP 01/18/2023, 1:33 PM [facility]

## 2023-01-30 ENCOUNTER — Ambulatory Visit: Payer: Medicare HMO

## 2023-02-01 DIAGNOSIS — M47816 Spondylosis without myelopathy or radiculopathy, lumbar region: Secondary | ICD-10-CM | POA: Diagnosis not present

## 2023-02-01 DIAGNOSIS — M47812 Spondylosis without myelopathy or radiculopathy, cervical region: Secondary | ICD-10-CM | POA: Diagnosis not present

## 2023-02-01 DIAGNOSIS — M25551 Pain in right hip: Secondary | ICD-10-CM | POA: Diagnosis not present

## 2023-02-06 ENCOUNTER — Ambulatory Visit: Payer: Medicare HMO | Attending: Cardiovascular Disease

## 2023-02-06 DIAGNOSIS — Z01810 Encounter for preprocedural cardiovascular examination: Secondary | ICD-10-CM

## 2023-02-06 NOTE — Addendum Note (Signed)
Addended by: Sharlene Dory on: 02/06/2023 10:15 AM   Modules accepted: Level of Service

## 2023-02-06 NOTE — Progress Notes (Signed)
Virtual Visit via Telephone Note   Because of Max Cole co-morbid illnesses, he is at least at moderate risk for complications without adequate follow up.  This format is felt to be most appropriate for this patient at this time.  The patient did not have access to video technology/had technical difficulties with video requiring transitioning to audio format only (telephone).  All issues noted in this document were discussed and addressed.  No physical exam could be performed with this format.  Please refer to the patient's chart for his consent to telehealth for [facility].  Evaluation Performed:  Preoperative cardiovascular risk assessment _____________   Date:  02/06/2023   Patient ID:  Max Cole, DOB 05-04-1928, MRN [id number] Patient Location:  Home Provider location:   Office  Primary Care Provider:  Carylon Perches, MD Primary Cardiologist:  Max Bunting, MD  Chief Complaint / Patient Profile   89 y.o. 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  History of Present Illness    Max Cole is a 89 y.o. male who presents via audio/video conferencing for a telehealth visit today.  Pt was last seen in cardiology clinic on 05/10/22 by Max Cole.  At that time Max Cole was doing well.  The patient is now pending procedure as outlined above. Since his last visit, he has been doing okay.  He is on oxygen.  Activity is limited by back pain.  He has had one afib in the last month, has these intermittently.  He was going to the gym but now is limited by back pain.  Overall, doing well.  Chest gets a little tight when in A-fib around 120 bpm but otherwise no chest tightness.  Chronic shortness of breath.  When his heart rate is going faster he is able to take a  pill and it settles down about 10 minutes.  Per office protocol, patient can hold Eliquis for 3 days prior to procedure.  Please resume Eliquis as soon as medically safe following procedure.   Past Medical History    Past Medical History:  Diagnosis Date   Atrial fibrillation (HCC)    Bradycardia    Cardiomyopathy 06/28/2003   a. Possibly alcoholic; b. cath in 2005-50% ostial D1, PCW of 12, EF of 35-40%;  c. EF of 0.25 in 11/2003;  d. 40-45% in 05/2004;  e. 25% in 10/2008 by echo;  f. 04/2015 Echo: EF 40-45%, diff HK, sev inflat/inf HK, Gr 1 DD, mild AI, triv TR.   Carotid artery disease (HCC)    CKD (chronic kidney disease) stage 3, GFR 30-59 ml/min (HCC)    Hyperlipidemia    Left bundle branch block    Prostate carcinoma [facility])    Syncope    [place] CRT-P 07/03/15 Max Cole   Past Surgical History:  Procedure Laterality Date   CATARACT EXTRACTION     Right   COLONOSCOPY N/A 08/16/2012   Procedure: COLONOSCOPY;  Surgeon: Max Hippo, MD;  Location[facility];  Service: Endoscopy;  Laterality: N/A;  930   COLONOSCOPY N/A 10/21/2015   Procedure: COLONOSCOPY;  Surgeon: Max Hippo, MD;  Location: [facility] ENDO SUITE;  Service: Endoscopy;  Laterality: N/A;  1200   COLONOSCOPY W/ POLYPECTOMY  2010   Diverticulosis   EP IMPLANTABLE DEVICE N/A 07/03/2015  Procedure: BiV Pacemaker Insertion CRT-P;  Surgeon: Max Maw, MD;  Location: [facility] INVASIVE CV LAB;  Service: Cardiovascular;  Laterality: N/A;   EYE SURGERY Right    Cataract   HERNIA REPAIR      Allergies  No Known Allergies  Home Medications    Prior to Admission medications   Medication Sig Start Date End Date Taking? Authorizing Provider  apixaban (ELIQUIS) 2.5 MG TABS tablet Take 1 tablet (2.5 mg total) by mouth 2 (two) times daily. 06/10/20   Max Holster, NP  ferrous sulfate 325 (65 FE) MG tablet Take 1 tablet (325 mg total) by mouth daily with breakfast. 06/10/20   Max Holster, NP  furosemide (LASIX) 20  MG tablet Take 1 tablet (20 mg total) by mouth daily. 06/10/20   Max Holster, NP  HYDROcodone-acetaminophen (NORCO/VICODIN) 5-325 MG tablet Take 1 tablet by mouth 2 (two) times daily.    [provider]  Methocarbamol (ROBAXIN PO) Take 500 mg by mouth See admin instructions. 1 tab in am and 1/2 tab in pm    [provider]  metoprolol tartrate (LOPRESSOR) 25 MG tablet Take 1 tablet (25 mg total) by mouth 2 (two) times daily. 05/10/22 05/05/23  Max Maw, MD  OXYGEN Inhale 2 L into the lungs continuous. 06/04/20   [provider]  pravastatin (PRAVACHOL) 20 MG tablet Take 1 tablet (20 mg total) by mouth every evening. 06/10/20   Max Holster, NP  tamsulosin (FLOMAX) 0.4 MG CAPS capsule Take 0.4 mg by mouth daily. 03/28/22   [provider]    Physical Exam    Vital Signs:  Max Cole does not have vital signs available for review today.  Given telephonic nature of communication, physical exam is limited. AAOx3. NAD. Normal affect.  Speech and respirations are unlabored.  Accessory Clinical Findings    None  Assessment & Plan    1.  Preoperative Cardiovascular Risk Assessment:  Max Cole perioperative risk of a major cardiac event is 6.6% according to the Revised Cardiac Risk Index (RCRI).  Therefore, he is at high risk for perioperative complications.   His functional capacity is good at 4.64 METs according to the Duke Activity Status Index (DASI). Recommendations: According to ACC/AHA guidelines, no further cardiovascular testing needed.  The patient may proceed to surgery at acceptable risk.   Antiplatelet and/or Anticoagulation Recommendations:  Eliquis (Apixaban) can be held for 3 days prior to surgery.  Please resume post op when felt to be safe.     The patient was advised that if he develops new symptoms prior to surgery to contact our office to arrange for a follow-up visit, and he verbalized understanding.   A copy  of this note will be routed to requesting surgeon.  Time:   Today, I have spent 10 minutes with the patient with telehealth technology discussing medical history, symptoms, and management plan.     Sharlene Dory, PA-C  02/06/2023, 10:00 AM

## 2023-02-17 DIAGNOSIS — N1832 Chronic kidney disease, stage 3b: Secondary | ICD-10-CM | POA: Diagnosis not present

## 2023-02-17 DIAGNOSIS — I5022 Chronic systolic (congestive) heart failure: Secondary | ICD-10-CM | POA: Diagnosis not present

## 2023-02-21 DIAGNOSIS — M5416 Radiculopathy, lumbar region: Secondary | ICD-10-CM | POA: Diagnosis not present

## 2023-02-21 DIAGNOSIS — M545 Low back pain, unspecified: Secondary | ICD-10-CM | POA: Diagnosis not present

## 2023-02-21 DIAGNOSIS — N1832 Chronic kidney disease, stage 3b: Secondary | ICD-10-CM | POA: Diagnosis not present

## 2023-02-21 DIAGNOSIS — I5022 Chronic systolic (congestive) heart failure: Secondary | ICD-10-CM | POA: Diagnosis not present

## 2023-03-06 ENCOUNTER — Ambulatory Visit (INDEPENDENT_AMBULATORY_CARE_PROVIDER_SITE_OTHER): Payer: Medicare HMO

## 2023-03-06 DIAGNOSIS — I428 Other cardiomyopathies: Secondary | ICD-10-CM

## 2023-03-06 DIAGNOSIS — I480 Paroxysmal atrial fibrillation: Secondary | ICD-10-CM

## 2023-03-07 LAB — CUP PACEART REMOTE DEVICE CHECK
Battery Remaining Longevity: 24 mo
Battery Remaining Percentage: 27 %
Brady Statistic RA Percent Paced: 12 %
Brady Statistic RV Percent Paced: 97 %
Date Time Interrogation Session: 20240909003200
Implantable Lead Connection Status: 753985
Implantable Lead Connection Status: 753985
Implantable Lead Connection Status: 753985
Implantable Lead Implant Date: 20170106
Implantable Lead Implant Date: 20170106
Implantable Lead Implant Date: 20170106
Implantable Lead Location: 753858
Implantable Lead Location: 753859
Implantable Lead Location: 753860
Implantable Lead Model: 4677
Implantable Lead Model: 7741
Implantable Lead Model: 7742
Implantable Lead Serial Number: 507382
Implantable Lead Serial Number: 695539
Implantable Lead Serial Number: 724660
Implantable Pulse Generator Implant Date: 20170106
Lead Channel Impedance Value: 592 Ohm
Lead Channel Impedance Value: 599 Ohm
Lead Channel Impedance Value: 644 Ohm
Lead Channel Pacing Threshold Amplitude: 1 V
Lead Channel Pacing Threshold Amplitude: 1.1 V
Lead Channel Pacing Threshold Pulse Width: 0.4 ms
Lead Channel Pacing Threshold Pulse Width: 0.8 ms
Lead Channel Setting Pacing Amplitude: 2 V
Lead Channel Setting Pacing Amplitude: 2.2 V
Lead Channel Setting Pacing Amplitude: 2.2 V
Lead Channel Setting Pacing Pulse Width: 0.4 ms
Lead Channel Setting Pacing Pulse Width: 0.8 ms
Lead Channel Setting Sensing Sensitivity: 2.5 mV
Lead Channel Setting Sensing Sensitivity: 2.5 mV
Pulse Gen Serial Number: 714587
Zone Setting Status: 755011

## 2023-03-20 NOTE — Progress Notes (Signed)
Remote pacemaker transmission.

## 2023-03-28 DIAGNOSIS — M25551 Pain in right hip: Secondary | ICD-10-CM | POA: Diagnosis not present

## 2023-03-28 DIAGNOSIS — M47816 Spondylosis without myelopathy or radiculopathy, lumbar region: Secondary | ICD-10-CM | POA: Diagnosis not present

## 2023-03-28 DIAGNOSIS — M5416 Radiculopathy, lumbar region: Secondary | ICD-10-CM | POA: Diagnosis not present

## 2023-05-01 ENCOUNTER — Ambulatory Visit: Payer: Medicare HMO

## 2023-05-17 ENCOUNTER — Other Ambulatory Visit: Payer: Self-pay | Admitting: Internal Medicine

## 2023-05-22 ENCOUNTER — Other Ambulatory Visit (HOSPITAL_COMMUNITY): Payer: Self-pay | Admitting: Internal Medicine

## 2023-05-22 DIAGNOSIS — I4891 Unspecified atrial fibrillation: Secondary | ICD-10-CM | POA: Diagnosis not present

## 2023-05-22 DIAGNOSIS — M545 Low back pain, unspecified: Secondary | ICD-10-CM | POA: Diagnosis not present

## 2023-05-22 DIAGNOSIS — I5042 Chronic combined systolic (congestive) and diastolic (congestive) heart failure: Secondary | ICD-10-CM | POA: Diagnosis not present

## 2023-05-22 DIAGNOSIS — R5383 Other fatigue: Secondary | ICD-10-CM | POA: Diagnosis not present

## 2023-05-22 DIAGNOSIS — M25551 Pain in right hip: Secondary | ICD-10-CM | POA: Diagnosis not present

## 2023-05-22 DIAGNOSIS — M479 Spondylosis, unspecified: Secondary | ICD-10-CM | POA: Diagnosis not present

## 2023-05-22 DIAGNOSIS — R0600 Dyspnea, unspecified: Secondary | ICD-10-CM | POA: Diagnosis not present

## 2023-05-22 DIAGNOSIS — M6281 Muscle weakness (generalized): Secondary | ICD-10-CM | POA: Diagnosis not present

## 2023-05-24 DIAGNOSIS — I4819 Other persistent atrial fibrillation: Secondary | ICD-10-CM | POA: Diagnosis not present

## 2023-05-24 DIAGNOSIS — I5022 Chronic systolic (congestive) heart failure: Secondary | ICD-10-CM | POA: Diagnosis not present

## 2023-05-24 DIAGNOSIS — G4700 Insomnia, unspecified: Secondary | ICD-10-CM | POA: Diagnosis not present

## 2023-05-24 DIAGNOSIS — Z79899 Other long term (current) drug therapy: Secondary | ICD-10-CM | POA: Diagnosis not present

## 2023-05-24 DIAGNOSIS — N1832 Chronic kidney disease, stage 3b: Secondary | ICD-10-CM | POA: Diagnosis not present

## 2023-05-26 ENCOUNTER — Ambulatory Visit (HOSPITAL_COMMUNITY)
Admission: RE | Admit: 2023-05-26 | Discharge: 2023-05-26 | Disposition: A | Discharge status: Home / Self Care | Payer: Medicare HMO | Source: Ambulatory Visit | Attending: Internal Medicine | Admitting: Internal Medicine

## 2023-05-26 DIAGNOSIS — M25551 Pain in right hip: Secondary | ICD-10-CM | POA: Insufficient documentation

## 2023-05-26 DIAGNOSIS — G8929 Other chronic pain: Secondary | ICD-10-CM | POA: Diagnosis not present

## 2023-05-26 DIAGNOSIS — M1611 Unilateral primary osteoarthritis, right hip: Secondary | ICD-10-CM | POA: Diagnosis not present

## 2023-05-29 DIAGNOSIS — M5416 Radiculopathy, lumbar region: Secondary | ICD-10-CM | POA: Diagnosis not present

## 2023-05-31 DIAGNOSIS — M545 Low back pain, unspecified: Secondary | ICD-10-CM | POA: Diagnosis not present

## 2023-05-31 DIAGNOSIS — Z23 Encounter for immunization: Secondary | ICD-10-CM | POA: Diagnosis not present

## 2023-05-31 DIAGNOSIS — I5022 Chronic systolic (congestive) heart failure: Secondary | ICD-10-CM | POA: Diagnosis not present

## 2023-05-31 DIAGNOSIS — D638 Anemia in other chronic diseases classified elsewhere: Secondary | ICD-10-CM | POA: Diagnosis not present

## 2023-05-31 DIAGNOSIS — N1832 Chronic kidney disease, stage 3b: Secondary | ICD-10-CM | POA: Diagnosis not present

## 2023-06-05 ENCOUNTER — Ambulatory Visit (INDEPENDENT_AMBULATORY_CARE_PROVIDER_SITE_OTHER): Payer: Medicare HMO

## 2023-06-05 DIAGNOSIS — I428 Other cardiomyopathies: Secondary | ICD-10-CM | POA: Diagnosis not present

## 2023-06-05 DIAGNOSIS — I480 Paroxysmal atrial fibrillation: Secondary | ICD-10-CM

## 2023-06-06 LAB — CUP PACEART REMOTE DEVICE CHECK
Battery Remaining Longevity: 24 mo
Battery Remaining Percentage: 28 %
Brady Statistic RA Percent Paced: 14 %
Brady Statistic RV Percent Paced: 97 %
Date Time Interrogation Session: 20241209003300
Implantable Lead Connection Status: 753985
Implantable Lead Connection Status: 753985
Implantable Lead Connection Status: 753985
Implantable Lead Implant Date: 20170106
Implantable Lead Implant Date: 20170106
Implantable Lead Implant Date: 20170106
Implantable Lead Location: 753858
Implantable Lead Location: 753859
Implantable Lead Location: 753860
Implantable Lead Model: 4677
Implantable Lead Model: 7741
Implantable Lead Model: 7742
Implantable Lead Serial Number: 507382
Implantable Lead Serial Number: 695539
Implantable Lead Serial Number: 724660
Implantable Pulse Generator Implant Date: 20170106
Lead Channel Impedance Value: 613 Ohm
Lead Channel Impedance Value: 622 Ohm
Lead Channel Impedance Value: 636 Ohm
Lead Channel Pacing Threshold Amplitude: 1 V
Lead Channel Pacing Threshold Amplitude: 1.1 V
Lead Channel Pacing Threshold Pulse Width: 0.4 ms
Lead Channel Pacing Threshold Pulse Width: 0.8 ms
Lead Channel Setting Pacing Amplitude: 2 V
Lead Channel Setting Pacing Amplitude: 2.2 V
Lead Channel Setting Pacing Amplitude: 2.2 V
Lead Channel Setting Pacing Pulse Width: 0.4 ms
Lead Channel Setting Pacing Pulse Width: 0.8 ms
Lead Channel Setting Sensing Sensitivity: 2.5 mV
Lead Channel Setting Sensing Sensitivity: 2.5 mV
Pulse Gen Serial Number: 714587
Zone Setting Status: 755011

## 2023-06-13 ENCOUNTER — Other Ambulatory Visit: Payer: Self-pay

## 2023-06-13 DIAGNOSIS — M25551 Pain in right hip: Secondary | ICD-10-CM | POA: Diagnosis not present

## 2023-06-13 MED ORDER — METOPROLOL TARTRATE 25 MG PO TABS: 25.0000 mg | ORAL_TABLET | Freq: Two times a day (BID) | ORAL | 1 refills | Status: DC

## 2023-07-03 ENCOUNTER — Telehealth: Payer: Self-pay | Admitting: Internal Medicine

## 2023-07-03 NOTE — Telephone Encounter (Signed)
 Family would like to talk to Dr. Ladona Ridgel about the possibility of hip replacement surgery at next visit.

## 2023-07-03 NOTE — Telephone Encounter (Signed)
 Pt's daughter states she would like to speak with the nurse about getting the pt's hip replaced. Please advise

## 2023-07-03 NOTE — Telephone Encounter (Signed)
 Max Cole is a moderate but acceptable risk for hip replacement.

## 2023-07-05 ENCOUNTER — Encounter: Payer: Self-pay | Admitting: Internal Medicine

## 2023-07-05 ENCOUNTER — Ambulatory Visit: Payer: Medicare HMO | Attending: Internal Medicine | Admitting: Internal Medicine

## 2023-07-05 VITALS — BP 130/58 | HR 86 | Ht 71.0 in | Wt 175.4 lb

## 2023-07-05 DIAGNOSIS — I428 Other cardiomyopathies: Secondary | ICD-10-CM

## 2023-07-05 NOTE — H&P (View-Only) (Signed)
HPI [name] returns for ongoing followup. He is a pleasant 89 year old man with a history of chronic systolic heart failure, paroxysmal atrial fibrillation who has been fairly well controlled on amiodarone therapy.  Unfortunately he developed lung toxicity within 3 months of starting the amio. He denies palpitations. He has not had recurrent palps and he has started back exercising. He checks his pulse ox and he will drop into the mid 80's but he has now gotten off of the steroid and his activity has normalized. He is on home oxygen. He has developed worsening pain in his right hip with bone on bone and is considering hip replacement.   No Known Allergies   Current Outpatient Medications  Medication Sig Dispense Refill   apixaban (ELIQUIS) 2.5 MG TABS tablet Take 1 tablet (2.5 mg total) by mouth 2 (two) times daily. 60 tablet 0   ferrous sulfate 325 (65 FE) MG tablet Take 1 tablet (325 mg total) by mouth daily with breakfast. 30 tablet 0   furosemide (LASIX) 40 MG tablet Take 60 mg by mouth daily.     Methocarbamol (ROBAXIN PO) Take 500 mg by mouth See admin instructions. 1 tab in am and 1/2 tab in pm     metoprolol tartrate (LOPRESSOR) 25 MG tablet Take 1 tablet (25 mg total) by mouth 2 (two) times daily. 60 tablet 1   oxyCODONE (OXY IR/ROXICODONE) 5 MG immediate release tablet Take 5 mg by mouth every 4 (four) hours as needed.     OXYGEN Inhale 2 L into the lungs continuous.     pravastatin (PRAVACHOL) 20 MG tablet Take 1 tablet (20 mg total) by mouth every evening. 30 tablet 0   tamsulosin (FLOMAX) 0.4 MG CAPS capsule Take 0.4 mg by mouth daily.     HYDROcodone-acetaminophen (NORCO/VICODIN) 5-325 MG tablet Take 1 tablet by mouth 2 (two) times daily. (Patient not taking: Reported on 07/05/2023)     No current facility-administered medications for this visit.     Past Medical History:  Diagnosis Date   Atrial fibrillation (HCC)    Bradycardia    Cardiomyopathy 06/28/2003    a. Possibly alcoholic; b. cath in 2005-50% ostial D1, PCW of 12, EF of 35-40%;  c. EF of 0.25 in 11/2003;  d. 40-45% in 05/2004;  e. 25% in 10/2008 by echo;  f. 04/2015 Echo: EF 40-45%, diff HK, sev inflat/inf HK, Gr 1 DD, mild AI, triv TR.   Carotid artery disease (HCC)    CKD (chronic kidney disease) stage 3, GFR 30-59 ml/min (HCC)    Hyperlipidemia    Left bundle branch block    Prostate carcinoma [facility])    Syncope    [place] CRT-P 07/03/15 Dr. Ladona Ridgel    ROS:   All systems reviewed and negative except as noted in the HPI.   Past Surgical History:  Procedure Laterality Date   CATARACT EXTRACTION     Right   COLONOSCOPY N/A 08/16/2012   Procedure: COLONOSCOPY;  Surgeon: Malissa Hippo, MD;  [facility];  Service: Endoscopy;  Laterality: N/A;  930   COLONOSCOPY N/A 10/21/2015   Procedure: COLONOSCOPY;  Surgeon: Malissa Hippo, MD;  Location: [facility];  Service: Endoscopy;  Laterality: N/A;  1200   COLONOSCOPY W/ POLYPECTOMY  2010   Diverticulosis   EP IMPLANTABLE DEVICE N/A 07/03/2015   Procedure: BiV Pacemaker Insertion CRT-P;  Surgeon: Marinus Maw, MD;  Location: [facility] INVASIVE CV LAB;  Service: Cardiovascular;  Laterality: N/A;   EYE SURGERY Right    Cataract   HERNIA REPAIR       Family History  Problem Relation Age of Onset   Hypertension Mother      Social History   Socioeconomic History   Marital status: Married    Spouse name: Not on file   Number of children: Not on file   Years of education: Not on file   Highest education level: Not on file  Occupational History   Not on file  Tobacco Use   Smoking status: Former    Current packs/day: 0.00    Average packs/day: 1 pack/day for 30.0 years (30.0 ttl pk-yrs)    Types: Cigarettes    Start date: 09/29/1943    Quit date: 09/28/1973    Years since quitting: 49.8   Smokeless tobacco: Never  Vaping Use   Vaping status: Never Used  Substance and Sexual Activity   Alcohol use: Yes    Alcohol/week: 5.0  standard drinks of alcohol    Types: 5 Glasses of wine per week    Comment: History of excessive alcohol use; Alternates between glass of wine vs. Liquor drink 5 days per week (04/2015)   Drug use: No   Sexual activity: Not on file  Other Topics Concern   Not on file  Social History Narrative   Not on file   Social Drivers of Health   Financial Resource Strain: Low Risk  (10/12/2022)   Overall Financial Resource Strain (CARDIA)    Difficulty of Paying Living Expenses: Not very hard  Food Insecurity: Not on file  Transportation Needs: No Transportation Needs (10/12/2022)   PRAPARE - Administrator, Civil Service (Medical): No    Lack of Transportation (Non-Medical): No  Physical Activity: Insufficiently Active (10/14/2022)   Exercise Vital Sign    Days of Exercise per Week: 3 days    Minutes of Exercise per Session: 30 min  Stress: Not on file  Social Connections: Not on file  Intimate Partner Violence: Not on file     Ht 5\' 11"  (1.803 m)   Wt 175 lb 6.4 oz (79.6 kg)   BMI 24.46 kg/m   Physical Exam:  Well appearing elderly man, NAD HEENT: Unremarkable Neck:  No JVD, no thyromegally Lymphatics:  No adenopathy Back:  No CVA tenderness Lungs:  Clear with minimal scattered rales. HEART:  Regular rate rhythm, no murmurs, no rubs, no clicks Abd:  soft, positive bowel sounds, no organomegally, no rebound, no guarding Ext:  2 plus pulses, no edema, no cyanosis, no clubbing Skin:  No rashes no nodules Neuro:  CN II through XII intact, motor grossly intact  EKG - NSR with biv pacing  DEVICE  Normal device function.  See PaceArt for detils.   Assess/Plan:  1. Atrial fib -he is maintaining NSR. He will undergo watchful waiting and when he goes back to persistent atrial fib we will consider AV node ablation. I will switch his coreg to toprol. 2. Chronic systolic heart failure - he has class 2 symptoms.  3. amio lung tox - he is doing well. No chest pain. Dyspnea  is improved. He is off of the steroids. 4. PPM - his [place] Sci biv ppm is working normally. 5. Preop eval - he is an acceptable surgical risk for hip replacement. He can hold his eliquis for 3 days before surgery and restart when the post op bleeding risk is acceptable. I suspect that lung complications, specifically his  ability to be extubated, his biggest risk.    Sharlot Gowda Garik Diamant,MD  Addendum: the patient has a biv PPM which has less than 2 years of longevity and is at high risk for sudden battery loss and will need to undergo PM gen change out.   Sharlot Gowda Jae Skeet,MD

## 2023-07-05 NOTE — Patient Instructions (Signed)

## 2023-07-05 NOTE — Progress Notes (Addendum)
 HPI Max Cole returns for ongoing followup. He is a pleasant 89 year old man with a history of chronic systolic heart failure, paroxysmal atrial fibrillation who has been fairly well controlled on amiodarone therapy.  Unfortunately he developed lung toxicity within 3 months of starting the amio. He denies palpitations. He has not had recurrent palps and he has started back exercising. He checks his pulse ox and he will drop into the mid 80's but he has now gotten off of the steroid and his activity has normalized. He is on home oxygen. He has developed worsening pain in his right hip with bone on bone and is considering hip replacement.   No Known Allergies   Current Outpatient Medications  Medication Sig Dispense Refill   apixaban (ELIQUIS) 2.5 MG TABS tablet Take 1 tablet (2.5 mg total) by mouth 2 (two) times daily. 60 tablet 0   ferrous sulfate 325 (65 FE) MG tablet Take 1 tablet (325 mg total) by mouth daily with breakfast. 30 tablet 0   furosemide (LASIX) 40 MG tablet Take 60 mg by mouth daily.     Methocarbamol (ROBAXIN PO) Take 500 mg by mouth See admin instructions. 1 tab in am and 1/2 tab in pm     metoprolol tartrate (LOPRESSOR) 25 MG tablet Take 1 tablet (25 mg total) by mouth 2 (two) times daily. 60 tablet 1   oxyCODONE (OXY IR/ROXICODONE) 5 MG immediate release tablet Take 5 mg by mouth every 4 (four) hours as needed.     OXYGEN Inhale 2 L into the lungs continuous.     pravastatin (PRAVACHOL) 20 MG tablet Take 1 tablet (20 mg total) by mouth every evening. 30 tablet 0   tamsulosin (FLOMAX) 0.4 MG CAPS capsule Take 0.4 mg by mouth daily.     HYDROcodone-acetaminophen (NORCO/VICODIN) 5-325 MG tablet Take 1 tablet by mouth 2 (two) times daily. (Patient not taking: Reported on 07/05/2023)     No current facility-administered medications for this visit.     Past Medical History:  Diagnosis Date   Atrial fibrillation (HCC)    Bradycardia    Cardiomyopathy 06/28/2003    a. Possibly alcoholic; b. cath in 2005-50% ostial D1, PCW of 12, EF of 35-40%;  c. EF of 0.25 in 11/2003;  d. 40-45% in 05/2004;  e. 25% in 10/2008 by echo;  f. 04/2015 Echo: EF 40-45%, diff HK, sev inflat/inf HK, Gr 1 DD, mild AI, triv TR.   Carotid artery disease (HCC)    CKD (chronic kidney disease) stage 3, GFR 30-59 ml/min (HCC)    Hyperlipidemia    Left bundle branch block    Prostate carcinoma [facility])    Syncope    [place] CRT-P 07/03/15 Dr. Ladona Ridgel    ROS:   All systems reviewed and negative except as noted in the HPI.   Past Surgical History:  Procedure Laterality Date   CATARACT EXTRACTION     Right   COLONOSCOPY N/A 08/16/2012   Procedure: COLONOSCOPY;  Surgeon: Malissa Hippo, MD;  [facility];  Service: Endoscopy;  Laterality: N/A;  930   COLONOSCOPY N/A 10/21/2015   Procedure: COLONOSCOPY;  Surgeon: Malissa Hippo, MD;  [facility];  Service: Endoscopy;  Laterality: N/A;  1200   COLONOSCOPY W/ POLYPECTOMY  2010   Diverticulosis   EP IMPLANTABLE DEVICE N/A 07/03/2015   Procedure: BiV Pacemaker Insertion CRT-P;  Surgeon: Marinus Maw, MD;  Location: [facility] INVASIVE CV LAB;  Service: Cardiovascular;  Laterality: N/A;   EYE SURGERY Right    Cataract   HERNIA REPAIR       Family History  Problem Relation Age of Onset   Hypertension Mother      Social History   Socioeconomic History   Marital status: Married    Spouse name: Not on file   Number of children: Not on file   Years of education: Not on file   Highest education level: Not on file  Occupational History   Not on file  Tobacco Use   Smoking status: Former    Current packs/day: 0.00    Average packs/day: 1 pack/day for 30.0 years (30.0 ttl pk-yrs)    Types: Cigarettes    Start date: 09/29/1943    Quit date: 09/28/1973    Years since quitting: 49.8   Smokeless tobacco: Never  Vaping Use   Vaping status: Never Used  Substance and Sexual Activity   Alcohol use: Yes    Alcohol/week: 5.0  standard drinks of alcohol    Types: 5 Glasses of wine per week    Comment: History of excessive alcohol use; Alternates between glass of wine vs. Liquor drink 5 days per week (04/2015)   Drug use: No   Sexual activity: Not on file  Other Topics Concern   Not on file  Social History Narrative   Not on file   Social Drivers of Health   Financial Resource Strain: Low Risk  (10/12/2022)   Overall Financial Resource Strain (CARDIA)    Difficulty of Paying Living Expenses: Not very hard  Food Insecurity: Not on file  Transportation Needs: No Transportation Needs (10/12/2022)   PRAPARE - Administrator, Civil Service (Medical): No    Lack of Transportation (Non-Medical): No  Physical Activity: Insufficiently Active (10/14/2022)   Exercise Vital Sign    Days of Exercise per Week: 3 days    Minutes of Exercise per Session: 30 min  Stress: Not on file  Social Connections: Not on file  Intimate Partner Violence: Not on file     Ht 5\' 11"  (1.803 m)   Wt 175 lb 6.4 oz (79.6 kg)   BMI 24.46 kg/m   Physical Exam:  Well appearing elderly man, NAD HEENT: Unremarkable Neck:  No JVD, no thyromegally Lymphatics:  No adenopathy Back:  No CVA tenderness Lungs:  Clear with minimal scattered rales. HEART:  Regular rate rhythm, no murmurs, no rubs, no clicks Abd:  soft, positive bowel sounds, no organomegally, no rebound, no guarding Ext:  2 plus pulses, no edema, no cyanosis, no clubbing Skin:  No rashes no nodules Neuro:  CN II through XII intact, motor grossly intact  EKG - NSR with biv pacing  DEVICE  Normal device function.  See PaceArt for detils.   Assess/Plan:  1. Atrial fib -he is maintaining NSR. He will undergo watchful waiting and when he goes back to persistent atrial fib we will consider AV node ablation. I will switch his coreg to toprol. 2. Chronic systolic heart failure - he has class 2 symptoms.  3. amio lung tox - he is doing well. No chest pain. Dyspnea  is improved. He is off of the steroids. 4. PPM - his [place] Sci biv ppm is working normally. 5. Preop eval - he is an acceptable surgical risk for hip replacement. He can hold his eliquis for 3 days before surgery and restart when the post op bleeding risk is acceptable. I suspect that lung complications, specifically his  ability to be extubated, his biggest risk.    Max Gowda Garik Diamant,MD  Addendum: the patient has a biv PPM which has less than 2 years of longevity and is at high risk for sudden battery loss and will need to undergo PM gen change out.   Max Gowda Jae Skeet,MD

## 2023-07-05 NOTE — Telephone Encounter (Signed)
 Pt seen in office today.

## 2023-07-06 LAB — CUP PACEART INCLINIC DEVICE CHECK
Brady Statistic RA Percent Paced: 14 %
Date Time Interrogation Session: 20250108103438
Implantable Lead Connection Status: 753985
Implantable Lead Connection Status: 753985
Implantable Lead Connection Status: 753985
Implantable Lead Implant Date: 20170106
Implantable Lead Implant Date: 20170106
Implantable Lead Implant Date: 20170106
Implantable Lead Location: 753858
Implantable Lead Location: 753859
Implantable Lead Location: 753860
Implantable Lead Model: 4677
Implantable Lead Model: 7741
Implantable Lead Model: 7742
Implantable Lead Serial Number: 507382
Implantable Lead Serial Number: 695539
Implantable Lead Serial Number: 724660
Implantable Pulse Generator Implant Date: 20170106
Pulse Gen Serial Number: 714587

## 2023-07-13 DIAGNOSIS — N1831 Chronic kidney disease, stage 3a: Secondary | ICD-10-CM | POA: Diagnosis not present

## 2023-07-13 DIAGNOSIS — I4891 Unspecified atrial fibrillation: Secondary | ICD-10-CM | POA: Diagnosis not present

## 2023-07-13 DIAGNOSIS — R0600 Dyspnea, unspecified: Secondary | ICD-10-CM | POA: Diagnosis not present

## 2023-07-13 DIAGNOSIS — M1611 Unilateral primary osteoarthritis, right hip: Secondary | ICD-10-CM | POA: Diagnosis not present

## 2023-07-13 DIAGNOSIS — M25551 Pain in right hip: Secondary | ICD-10-CM | POA: Diagnosis not present

## 2023-07-13 DIAGNOSIS — Z515 Encounter for palliative care: Secondary | ICD-10-CM | POA: Diagnosis not present

## 2023-07-13 DIAGNOSIS — I5042 Chronic combined systolic (congestive) and diastolic (congestive) heart failure: Secondary | ICD-10-CM | POA: Diagnosis not present

## 2023-07-13 DIAGNOSIS — R5383 Other fatigue: Secondary | ICD-10-CM | POA: Diagnosis not present

## 2023-07-18 ENCOUNTER — Telehealth: Payer: Self-pay

## 2023-07-18 NOTE — Telephone Encounter (Signed)
Pt daughter states someone told her he need a gen change. I let her speak with Mandy, rn.

## 2023-07-18 NOTE — Telephone Encounter (Signed)
Per Dr. Ladona Ridgel-- Bradly Bienenstock, we will need to schedule him for a gen change out. I did not speak to him about this. Can you call him and get him scheduled and I will review with him when he comes in to have his gen changed out. GT

## 2023-07-19 ENCOUNTER — Other Ambulatory Visit: Payer: Self-pay | Admitting: *Deleted

## 2023-07-19 ENCOUNTER — Telehealth: Payer: Self-pay | Admitting: *Deleted

## 2023-07-19 ENCOUNTER — Encounter: Payer: Self-pay | Admitting: *Deleted

## 2023-07-19 DIAGNOSIS — Z01818 Encounter for other preprocedural examination: Secondary | ICD-10-CM

## 2023-07-19 DIAGNOSIS — I5042 Chronic combined systolic (congestive) and diastolic (congestive) heart failure: Secondary | ICD-10-CM

## 2023-07-19 NOTE — Telephone Encounter (Signed)
Called pt's mobile number to schedule PPM Gen change. Spoke with daughter ( OK per DPR). Pt scheduled for Gen change on Feb 3 @ 11:30 am. Pt's daughter made aware and voiced understanding. Instruction sheet mailed to pt.

## 2023-07-27 ENCOUNTER — Other Ambulatory Visit (HOSPITAL_COMMUNITY)
Admission: RE | Admit: 2023-07-27 | Discharge: 2023-07-27 | Disposition: A | Discharge status: Home / Self Care | Payer: Medicare HMO | Source: Ambulatory Visit | Attending: Internal Medicine | Admitting: Internal Medicine

## 2023-07-27 DIAGNOSIS — Z01818 Encounter for other preprocedural examination: Secondary | ICD-10-CM | POA: Diagnosis not present

## 2023-07-27 DIAGNOSIS — I5042 Chronic combined systolic (congestive) and diastolic (congestive) heart failure: Secondary | ICD-10-CM | POA: Diagnosis not present

## 2023-07-27 LAB — CBC
HCT: 28.9 % — ABNORMAL LOW (ref 39.0–52.0)
Hemoglobin: 9.3 g/dL — ABNORMAL LOW (ref 13.0–17.0)
MCH: 34.4 pg — ABNORMAL HIGH (ref 26.0–34.0)
MCHC: 32.2 g/dL (ref 30.0–36.0)
MCV: 107 fL — ABNORMAL HIGH (ref 80.0–100.0)
Platelets: 282 10*3/uL (ref 150–400)
RBC: 2.7 MIL/uL — ABNORMAL LOW (ref 4.22–5.81)
RDW: 12.6 % (ref 11.5–15.5)
WBC: 7.8 10*3/uL (ref 4.0–10.5)
nRBC: 0 % (ref 0.0–0.2)

## 2023-07-27 LAB — BASIC METABOLIC PANEL
Anion gap: 9 (ref 5–15)
BUN: 40 mg/dL — ABNORMAL HIGH (ref 8–23)
CO2: 28 mmol/L (ref 22–32)
Calcium: 9.1 mg/dL (ref 8.9–10.3)
Chloride: 99 mmol/L (ref 98–111)
Creatinine, Ser: 1.6 mg/dL — ABNORMAL HIGH (ref 0.61–1.24)
GFR, Estimated: 40 mL/min — ABNORMAL LOW (ref >60)
Glucose, Bld: 93 mg/dL (ref 70–99)
Potassium: 4.4 mmol/L (ref 3.5–5.1)
Sodium: 136 mmol/L (ref 135–145)

## 2023-07-28 NOTE — Pre-Procedure Instructions (Signed)
Instructed patient on the following items: Arrival time 0930 Nothing to eat or drink after midnight No meds AM of procedure Responsible person to drive you home and stay with you for 24 hrs Wash with special soap night before and morning of procedure If on anti-coagulant drug instructions Eliquis- last dose will be tonight 1/31.

## 2023-07-31 ENCOUNTER — Ambulatory Visit: Payer: Medicare HMO

## 2023-07-31 ENCOUNTER — Ambulatory Visit (HOSPITAL_COMMUNITY)
Admission: RE | Admit: 2023-07-31 | Discharge: 2023-07-31 | Disposition: A | Discharge status: Home / Self Care | Payer: Medicare HMO | Attending: Internal Medicine | Admitting: Internal Medicine

## 2023-07-31 ENCOUNTER — Encounter (HOSPITAL_COMMUNITY): Payer: Self-pay | Admitting: Internal Medicine

## 2023-07-31 ENCOUNTER — Other Ambulatory Visit: Payer: Self-pay

## 2023-07-31 ENCOUNTER — Encounter (HOSPITAL_COMMUNITY)
Admission: RE | Disposition: A | Discharge status: Home / Self Care | Payer: Self-pay | Source: Home / Self Care | Attending: Internal Medicine

## 2023-07-31 DIAGNOSIS — Z7901 Long term (current) use of anticoagulants: Secondary | ICD-10-CM | POA: Diagnosis not present

## 2023-07-31 DIAGNOSIS — Z45010 Encounter for checking and testing of cardiac pacemaker pulse generator [battery]: Secondary | ICD-10-CM | POA: Insufficient documentation

## 2023-07-31 DIAGNOSIS — Z9981 Dependence on supplemental oxygen: Secondary | ICD-10-CM | POA: Diagnosis not present

## 2023-07-31 DIAGNOSIS — I447 Left bundle-branch block, unspecified: Secondary | ICD-10-CM | POA: Diagnosis not present

## 2023-07-31 DIAGNOSIS — M25551 Pain in right hip: Secondary | ICD-10-CM | POA: Diagnosis not present

## 2023-07-31 DIAGNOSIS — Z79899 Other long term (current) drug therapy: Secondary | ICD-10-CM | POA: Diagnosis not present

## 2023-07-31 DIAGNOSIS — I480 Paroxysmal atrial fibrillation: Secondary | ICD-10-CM | POA: Insufficient documentation

## 2023-07-31 DIAGNOSIS — I5022 Chronic systolic (congestive) heart failure: Secondary | ICD-10-CM | POA: Diagnosis not present

## 2023-07-31 HISTORY — PX: PPM GENERATOR CHANGEOUT: EP1233

## 2023-07-31 SURGERY — PPM GENERATOR CHANGEOUT

## 2023-07-31 MED ORDER — CHLORHEXIDINE GLUCONATE 4 % EX SOLN: 4.0000 | Freq: Once | CUTANEOUS | Status: DC

## 2023-07-31 MED ORDER — LIDOCAINE HCL 1 % IJ SOLN
INTRAMUSCULAR | Status: AC
  Filled 2023-07-31: qty 60

## 2023-07-31 MED ORDER — FENTANYL CITRATE (PF) 100 MCG/2ML IJ SOLN
INTRAMUSCULAR | Status: DC | PRN
  Administered 2023-07-31: 25 ug via INTRAVENOUS

## 2023-07-31 MED ORDER — CEFAZOLIN SODIUM-DEXTROSE 2-4 GM/100ML-% IV SOLN: 2.0000 g | INTRAVENOUS | Status: DC

## 2023-07-31 MED ORDER — MIDAZOLAM HCL 5 MG/5ML IJ SOLN
INTRAMUSCULAR | Status: AC
  Filled 2023-07-31: qty 5

## 2023-07-31 MED ORDER — SODIUM CHLORIDE 0.9% FLUSH: 3.0000 mL | INTRAVENOUS | Status: DC | PRN

## 2023-07-31 MED ORDER — CEFAZOLIN SODIUM-DEXTROSE 2-4 GM/100ML-% IV SOLN
INTRAVENOUS | Status: AC
  Administered 2023-07-31: 2 g
  Filled 2023-07-31: qty 100

## 2023-07-31 MED ORDER — SODIUM CHLORIDE 0.9% FLUSH: 3.0000 mL | Freq: Two times a day (BID) | INTRAVENOUS | Status: DC

## 2023-07-31 MED ORDER — SODIUM CHLORIDE 0.9 % IV SOLN
INTRAVENOUS | Status: AC
  Administered 2023-07-31: 80 mg
  Filled 2023-07-31: qty 2

## 2023-07-31 MED ORDER — FENTANYL CITRATE (PF) 100 MCG/2ML IJ SOLN
INTRAMUSCULAR | Status: AC
  Filled 2023-07-31: qty 2

## 2023-07-31 MED ORDER — SODIUM CHLORIDE 0.9 % IV SOLN: 80.0000 mg | INTRAVENOUS | Status: DC

## 2023-07-31 MED ORDER — POVIDONE-IODINE 10 % EX SWAB: 2.0000 | Freq: Once | CUTANEOUS | Status: DC

## 2023-07-31 SURGICAL SUPPLY — 6 items
CABLE SURGICAL S-101-97-12 (CABLE) ×2 IMPLANT
PACEMAKER VISIONIST X4 U228 (Pacemaker) IMPLANT
PAD DEFIB RADIO PHYSIO CONN (PAD) ×2 IMPLANT
POUCH AIGIS-R ANTIBACT PPM (Mesh General) ×1 IMPLANT
POUCH AIGIS-R ANTIBACT PPM MED (Mesh General) IMPLANT
TRAY PACEMAKER INSERTION (PACKS) ×2 IMPLANT

## 2023-07-31 NOTE — Interval H&P Note (Signed)
History and Physical Interval Note:  07/31/2023 10:08 AM  Max Cole  has presented today for surgery, with the diagnosis of recall - chf.  The various methods of treatment have been discussed with the patient and family. After consideration of risks, benefits and other options for treatment, the patient has consented to  Procedure(s): PPM GENERATOR CHANGEOUT (N/A) as a surgical intervention.  The patient's history has been reviewed, patient examined, no change in status, stable for surgery.  I have reviewed the patient's chart and labs.  Questions were answered to the patient's satisfaction.     Lewayne Bunting

## 2023-07-31 NOTE — Discharge Instructions (Signed)

## 2023-07-31 NOTE — Progress Notes (Signed)
 Patient was given discharge instructions. He verbalized understanding.

## 2023-08-01 ENCOUNTER — Telehealth (HOSPITAL_COMMUNITY): Payer: Self-pay

## 2023-08-01 ENCOUNTER — Telehealth: Payer: Self-pay | Admitting: *Deleted

## 2023-08-01 MED FILL — Lidocaine HCl Local Preservative Free (PF) Inj 1%: INTRAMUSCULAR | Qty: 30 | Status: AC

## 2023-08-01 NOTE — Telephone Encounter (Signed)
   Pre-operative Risk Assessment    Patient Name: Max Cole  DOB: [birth year] MRN: [id number]   Date of last office visit: 07/05/23 DR. TAYLOR Date of next office visit: NONE   Request for Surgical Clearance    Procedure:  RIGHT TOTAL HIP ARTHROPLASTY  Date of Surgery:  Clearance 10/03/23                                Surgeon:  DR. MATTHEW OLIN Surgeon's Group or Practice Name:  [facility] Phone number:  [phone] JOEN SIC Fax number:  [phone]   Type of Clearance Requested:   - Medical  - Pharmacy:  Hold Apixaban  (Eliquis )     Type of Anesthesia:  Spinal   Additional requests/questions:    Bonney Niels Jest   08/01/2023, 6:14 PM

## 2023-08-01 NOTE — Telephone Encounter (Signed)
 Spoke with patient's daughter, Max Cole to complete post procedure follow up call.   Max Cole denies any concerns and reports bandage as clean and intact.   Instructed that the large square bandage on the chest can be removed after 24 hours from procedure, on today.  If your incision is sealed with Steri-strips or staples, you may shower 7 days after your procedure or when told by your provider. Do not remove the steri-strips or let the shower hit directly on your site. You may wash around your site with soap and water  Advised it is common to have bruising AND soreness around incision site. Call the device clinic at [phone] if you notice any signs of infection: fever, redness, swelling, pus drainage, foul odor or increased pain at incision. Resume Eliquis  on 08/05/23 per D/C instructions Any questions regarding activity restrictions? NO  Patient will remote pacemaker check on 09/04/2023. Max Cole verbalized understanding to all information provided.

## 2023-08-02 NOTE — Telephone Encounter (Signed)
   Patient Name: Max Cole  DOB: 1928/04/25 MRN: [id number]  Primary Cardiologist: Danelle Birmingham, MD  Chart reviewed as part of pre-operative protocol coverage. Given past medical history and time since last visit, based on ACC/AHA guidelines, ZAYDENN BALAGUER is at acceptable risk for the planned procedure without further cardiovascular testing.    He can hold his eliquis  for 3 days before surgery and restart when the post op bleeding risk is acceptable. I suspect that lung complications, specifically his ability to be extubated, his biggest risk.  The patient was advised that if he develops new symptoms prior to surgery to contact our office to arrange for a follow-up visit, and he verbalized understanding.  I will route this recommendation to the requesting party via Epic fax function and remove from pre-op pool.  Please call with questions.  Wyn Raddle, Jackee Shove, NP 08/02/2023, 7:22 AM

## 2023-08-08 ENCOUNTER — Other Ambulatory Visit: Payer: Self-pay | Admitting: Internal Medicine

## 2023-08-21 NOTE — Progress Notes (Unsigned)
 Cardiology Office Note:  .   Date:  08/21/2023  ID:  DEDDRICK SAINDON, DOB [birth year], MRN [id number] PCP: Carylon Perches, MD  [facility] Providers Cardiologist:  Lewayne Bunting, MD Electrophysiologist:  Lewayne Bunting, MD {  History of Present Illness: .   Max Cole is a 89 y.o. male w/PMHx of NICM,  symptomatic bradycardia w/CRT-P, CKD (IIIb), AFib, LBBB   Last pulm visit looks like July 2023, O2 2L at night Goal O2 sat 90%  He saw Dr. Ladona Ridgel 07/05/23, pulm status improving, remained on home O2, better exertional capacity Low AF burden > coreg changed to toprol Felt an acceptable cardiac candidate for hip surgery  Underwent gen change 07/31/23  Pre-op pool addressed pending hip surgery/clearance Hold Eliquis for 3 days  Today's visit is scheduled as wound check visit  ROS:   *** Check lead outputs *** site *** AF burden *** eliquis, dose, labs, bleeding  Device information BSCi CRT-P implanted 07/03/2015, gen change 07/31/2023  Arrhythmia/AAD hx Amiodarone >> stopped 2021, with development of pulm tox  Studies Reviewed: Marland Kitchen    EKG not done today  DEVICE interrogation done today and reviewed by myself *** Battery and lead measurements are good ***   10/20/21: TTE 1. Left ventricular ejection fraction, by estimation, is 60 to 65%. The  left ventricle has normal function. The left ventricle has no regional  wall motion abnormalities. There is mild left ventricular hypertrophy.  Left ventricular diastolic parameters  are consistent with Grade I diastolic dysfunction (impaired relaxation).   2. Right ventricular systolic function is normal. The right ventricular  size is normal. There is normal pulmonary artery systolic pressure. The  estimated right ventricular systolic pressure is 35.9 mmHg.   3. Left atrial size was moderately dilated.   4. Right atrial size was moderately dilated.   5. The mitral valve is degenerative. Mild mitral valve regurgitation.   6. The  aortic valve is tricuspid. There is moderate calcification of the  aortic valve. Aortic valve regurgitation is mild. Aortic valve  sclerosis/calcification is present, without any evidence of aortic  stenosis. Aortic regurgitation PHT measures 535  msec. Aortic valve mean gradient measures 7.0 mmHg.   7. The inferior vena cava is normal in size with greater than 50%  respiratory variability, suggesting right atrial pressure of 3 mmHg.   Comparison(s): Prior images reviewed side by side. LVEF has improved  somewhat in comparison.     Risk Assessment/Calculations:    Physical Exam:   VS:  There were no vitals taken for this visit.   Wt Readings from Last 3 Encounters:  07/31/23 170 lb (77.1 kg)  07/05/23 175 lb 6.4 oz (79.6 kg)  05/10/22 176 lb (79.8 kg)    GEN: Well nourished, well developed in no acute distress NECK: No JVD; No carotid bruits CARDIAC: ***RRR, no murmurs, rubs, gallops RESPIRATORY:  *** CTA b/l without rales, wheezing or rhonchi  ABDOMEN: Soft, non-tender, non-distended EXTREMITIES: *** No edema; No deformity   PPM site: *** is stable, no thinning, fluctuation, tethering  ASSESSMENT AND PLAN: .    CRT-P *** site *** intact function *** no programming changes made  paroxysmal AFib CHA2DS2Vasc is 3, on Eliquis, *** appropriately dosed *** % burden  NICM Recovered LVEF by his echo in 2023  Secondary hypercoagulable state 2/2 AFib     {Are you ordering a CV Procedure (e.g. stress test, cath, DCCV, TEE, etc)?   Press F2        :  [id number]}     Dispo: ***  Signed, Sheilah Pigeon, PA-C

## 2023-08-23 ENCOUNTER — Ambulatory Visit: Payer: Medicare HMO | Attending: Physician Assistant | Admitting: Physician Assistant

## 2023-08-23 ENCOUNTER — Encounter: Payer: Self-pay | Admitting: Physician Assistant

## 2023-08-23 VITALS — BP 138/66 | HR 74 | Ht 72.0 in | Wt 169.6 lb

## 2023-08-23 DIAGNOSIS — D6869 Other thrombophilia: Secondary | ICD-10-CM

## 2023-08-23 DIAGNOSIS — I4729 Other ventricular tachycardia: Secondary | ICD-10-CM | POA: Diagnosis not present

## 2023-08-23 DIAGNOSIS — Z950 Presence of cardiac pacemaker: Secondary | ICD-10-CM

## 2023-08-23 DIAGNOSIS — I4719 Other supraventricular tachycardia: Secondary | ICD-10-CM

## 2023-08-23 DIAGNOSIS — Z5189 Encounter for other specified aftercare: Secondary | ICD-10-CM | POA: Diagnosis not present

## 2023-08-23 DIAGNOSIS — I480 Paroxysmal atrial fibrillation: Secondary | ICD-10-CM | POA: Diagnosis not present

## 2023-08-23 DIAGNOSIS — I428 Other cardiomyopathies: Secondary | ICD-10-CM

## 2023-08-23 LAB — CUP PACEART INCLINIC DEVICE CHECK
Date Time Interrogation Session: 20250226172142
Implantable Lead Connection Status: 753985
Implantable Lead Connection Status: 753985
Implantable Lead Connection Status: 753985
Implantable Lead Implant Date: 20170106
Implantable Lead Implant Date: 20170106
Implantable Lead Implant Date: 20170106
Implantable Lead Location: 753858
Implantable Lead Location: 753859
Implantable Lead Location: 753860
Implantable Lead Model: 4677
Implantable Lead Model: 7741
Implantable Lead Model: 7742
Implantable Lead Serial Number: 507382
Implantable Lead Serial Number: 695539
Implantable Lead Serial Number: 724660
Implantable Pulse Generator Implant Date: 20250203
Lead Channel Impedance Value: 570 Ohm
Lead Channel Impedance Value: 615 Ohm
Lead Channel Impedance Value: 622 Ohm
Lead Channel Pacing Threshold Amplitude: 0.6 V
Lead Channel Pacing Threshold Amplitude: 0.7 V
Lead Channel Pacing Threshold Amplitude: 1 V
Lead Channel Pacing Threshold Pulse Width: 0.4 ms
Lead Channel Pacing Threshold Pulse Width: 0.4 ms
Lead Channel Pacing Threshold Pulse Width: 0.6 ms
Lead Channel Sensing Intrinsic Amplitude: 10.4 mV
Lead Channel Sensing Intrinsic Amplitude: 15.4 mV
Lead Channel Sensing Intrinsic Amplitude: 3.1 mV
Lead Channel Setting Pacing Amplitude: 2 V
Lead Channel Setting Pacing Amplitude: 2.2 V
Lead Channel Setting Pacing Amplitude: 2.5 V
Lead Channel Setting Pacing Pulse Width: 0.4 ms
Lead Channel Setting Pacing Pulse Width: 0.6 ms
Lead Channel Setting Sensing Sensitivity: 2.5 mV
Lead Channel Setting Sensing Sensitivity: 2.5 mV
Pulse Gen Serial Number: 800611
Zone Setting Status: 755011

## 2023-08-23 MED ORDER — METOPROLOL TARTRATE 50 MG PO TABS: 50.0000 mg | ORAL_TABLET | Freq: Two times a day (BID) | ORAL | 3 refills | Status: AC

## 2023-08-23 NOTE — Addendum Note (Signed)
 Addended by: Oleta Mouse on: 08/23/2023 02:04 PM   Modules accepted: Orders

## 2023-08-23 NOTE — Patient Instructions (Signed)
 Medication Instructions:   .Your physician recommends that you continue on your current medications as directed. Please refer to the Current Medication list given to you today.   *If you need a refill on your cardiac medications before your next appointment, please call your pharmacy*   Lab Work: NONE ORDERED  TODAY    If you have labs (blood work) drawn today and your tests are completely normal, you will receive your results only by: MyChart Message (if you have MyChart) OR A paper copy in the mail If you have any lab test that is abnormal or we need to change your treatment, we will call you to review the results.   Testing/Procedures:  NONE ORDERED  TODAY     Follow-Up: At [facility], you and your health needs are our priority.  As part of our continuing mission to provide you with exceptional heart care, we have created designated Provider Care Teams.  These Care Teams include your primary Cardiologist (physician) and Advanced Practice Providers (APPs -  Physician Assistants and Nurse Practitioners) who all work together to provide you with the care you need, when you need it.  We recommend signing up for the patient portal called "MyChart".  Sign up information is provided on this After Visit Summary.  MyChart is used to connect with patients for Virtual Visits (Telemedicine).  Patients are able to view lab/test results, encounter notes, upcoming appointments, etc.  Non-urgent messages can be sent to your provider as well.   To learn more about what you can do with MyChart, go to [email or web address].    Your next appointment:  AS SCHEDULED    Provider:    You may see Lewayne Bunting, MD or one of the following Advanced Practice Providers on your designated Care Team:   Francis Dowse, [place]    Other Instructions

## 2023-08-24 DIAGNOSIS — R5383 Other fatigue: Secondary | ICD-10-CM | POA: Diagnosis not present

## 2023-08-24 DIAGNOSIS — I4891 Unspecified atrial fibrillation: Secondary | ICD-10-CM | POA: Diagnosis not present

## 2023-08-24 DIAGNOSIS — I5042 Chronic combined systolic (congestive) and diastolic (congestive) heart failure: Secondary | ICD-10-CM | POA: Diagnosis not present

## 2023-08-24 DIAGNOSIS — N1831 Chronic kidney disease, stage 3a: Secondary | ICD-10-CM | POA: Diagnosis not present

## 2023-08-24 DIAGNOSIS — M545 Low back pain, unspecified: Secondary | ICD-10-CM | POA: Diagnosis not present

## 2023-08-24 DIAGNOSIS — Z515 Encounter for palliative care: Secondary | ICD-10-CM | POA: Diagnosis not present

## 2023-08-24 DIAGNOSIS — R0600 Dyspnea, unspecified: Secondary | ICD-10-CM | POA: Diagnosis not present

## 2023-08-24 DIAGNOSIS — M25551 Pain in right hip: Secondary | ICD-10-CM | POA: Diagnosis not present

## 2023-09-02 ENCOUNTER — Telehealth: Payer: Self-pay | Admitting: Physician Assistant

## 2023-09-02 ENCOUNTER — Other Ambulatory Visit: Payer: Self-pay

## 2023-09-02 ENCOUNTER — Encounter (HOSPITAL_COMMUNITY): Payer: Self-pay

## 2023-09-02 ENCOUNTER — Emergency Department (HOSPITAL_COMMUNITY)

## 2023-09-02 ENCOUNTER — Inpatient Hospital Stay (HOSPITAL_COMMUNITY)
Admission: EM | Admit: 2023-09-02 | Discharge: 2023-09-06 | DRG: 863 | Disposition: A | Discharge status: Home / Self Care | Attending: Cardiovascular Disease | Admitting: Cardiovascular Disease

## 2023-09-02 DIAGNOSIS — Z79899 Other long term (current) drug therapy: Secondary | ICD-10-CM | POA: Diagnosis not present

## 2023-09-02 DIAGNOSIS — I700 Atherosclerosis of aorta: Secondary | ICD-10-CM | POA: Diagnosis not present

## 2023-09-02 DIAGNOSIS — I480 Paroxysmal atrial fibrillation: Secondary | ICD-10-CM | POA: Diagnosis present

## 2023-09-02 DIAGNOSIS — I2510 Atherosclerotic heart disease of native coronary artery without angina pectoris: Secondary | ICD-10-CM | POA: Diagnosis present

## 2023-09-02 DIAGNOSIS — I447 Left bundle-branch block, unspecified: Secondary | ICD-10-CM | POA: Diagnosis present

## 2023-09-02 DIAGNOSIS — T8141XA Infection following a procedure, superficial incisional surgical site, initial encounter: Principal | ICD-10-CM | POA: Diagnosis present

## 2023-09-02 DIAGNOSIS — Z8546 Personal history of malignant neoplasm of prostate: Secondary | ICD-10-CM | POA: Diagnosis not present

## 2023-09-02 DIAGNOSIS — T827XXA Infection and inflammatory reaction due to other cardiac and vascular devices, implants and grafts, initial encounter: Secondary | ICD-10-CM | POA: Diagnosis not present

## 2023-09-02 DIAGNOSIS — J9611 Chronic respiratory failure with hypoxia: Secondary | ICD-10-CM | POA: Diagnosis present

## 2023-09-02 DIAGNOSIS — R001 Bradycardia, unspecified: Secondary | ICD-10-CM | POA: Diagnosis present

## 2023-09-02 DIAGNOSIS — L02213 Cutaneous abscess of chest wall: Secondary | ICD-10-CM | POA: Diagnosis present

## 2023-09-02 DIAGNOSIS — N400 Enlarged prostate without lower urinary tract symptoms: Secondary | ICD-10-CM | POA: Diagnosis present

## 2023-09-02 DIAGNOSIS — L0390 Cellulitis, unspecified: Secondary | ICD-10-CM | POA: Diagnosis present

## 2023-09-02 DIAGNOSIS — K5900 Constipation, unspecified: Secondary | ICD-10-CM | POA: Diagnosis present

## 2023-09-02 DIAGNOSIS — M879 Osteonecrosis, unspecified: Secondary | ICD-10-CM | POA: Diagnosis present

## 2023-09-02 DIAGNOSIS — E785 Hyperlipidemia, unspecified: Secondary | ICD-10-CM | POA: Diagnosis present

## 2023-09-02 DIAGNOSIS — N179 Acute kidney failure, unspecified: Secondary | ICD-10-CM | POA: Diagnosis not present

## 2023-09-02 DIAGNOSIS — Y831 Surgical operation with implant of artificial internal device as the cause of abnormal reaction of the patient, or of later complication, without mention of misadventure at the time of the procedure: Secondary | ICD-10-CM | POA: Diagnosis present

## 2023-09-02 DIAGNOSIS — D539 Nutritional anemia, unspecified: Secondary | ICD-10-CM | POA: Diagnosis present

## 2023-09-02 DIAGNOSIS — I5042 Chronic combined systolic (congestive) and diastolic (congestive) heart failure: Secondary | ICD-10-CM | POA: Diagnosis present

## 2023-09-02 DIAGNOSIS — N183 Chronic kidney disease, stage 3 unspecified: Secondary | ICD-10-CM | POA: Diagnosis present

## 2023-09-02 DIAGNOSIS — Z7901 Long term (current) use of anticoagulants: Secondary | ICD-10-CM

## 2023-09-02 DIAGNOSIS — R918 Other nonspecific abnormal finding of lung field: Secondary | ICD-10-CM | POA: Diagnosis not present

## 2023-09-02 DIAGNOSIS — Z8249 Family history of ischemic heart disease and other diseases of the circulatory system: Secondary | ICD-10-CM

## 2023-09-02 DIAGNOSIS — I429 Cardiomyopathy, unspecified: Secondary | ICD-10-CM | POA: Diagnosis present

## 2023-09-02 DIAGNOSIS — Z87891 Personal history of nicotine dependence: Secondary | ICD-10-CM

## 2023-09-02 DIAGNOSIS — Z950 Presence of cardiac pacemaker: Secondary | ICD-10-CM

## 2023-09-02 DIAGNOSIS — Z66 Do not resuscitate: Secondary | ICD-10-CM | POA: Diagnosis present

## 2023-09-02 DIAGNOSIS — I38 Endocarditis, valve unspecified: Secondary | ICD-10-CM | POA: Diagnosis not present

## 2023-09-02 LAB — CBC WITH DIFFERENTIAL/PLATELET
Abs Immature Granulocytes: 0.03 10*3/uL (ref 0.00–0.07)
Basophils Absolute: 0.1 10*3/uL (ref 0.0–0.1)
Basophils Relative: 1 %
Eosinophils Absolute: 0.2 10*3/uL (ref 0.0–0.5)
Eosinophils Relative: 2 %
HCT: 30.9 % — ABNORMAL LOW (ref 39.0–52.0)
Hemoglobin: 10 g/dL — ABNORMAL LOW (ref 13.0–17.0)
Immature Granulocytes: 0 %
Lymphocytes Relative: 22 %
Lymphs Abs: 2 10*3/uL (ref 0.7–4.0)
MCH: 34.2 pg — ABNORMAL HIGH (ref 26.0–34.0)
MCHC: 32.4 g/dL (ref 30.0–36.0)
MCV: 105.8 fL — ABNORMAL HIGH (ref 80.0–100.0)
Monocytes Absolute: 1.1 10*3/uL — ABNORMAL HIGH (ref 0.1–1.0)
Monocytes Relative: 12 %
Neutro Abs: 5.6 10*3/uL (ref 1.7–7.7)
Neutrophils Relative %: 63 %
Platelets: 299 10*3/uL (ref 150–400)
RBC: 2.92 MIL/uL — ABNORMAL LOW (ref 4.22–5.81)
RDW: 11.8 % (ref 11.5–15.5)
WBC: 8.9 10*3/uL (ref 4.0–10.5)
nRBC: 0 % (ref 0.0–0.2)

## 2023-09-02 LAB — BASIC METABOLIC PANEL
Anion gap: 12 (ref 5–15)
BUN: 44 mg/dL — ABNORMAL HIGH (ref 8–23)
CO2: 27 mmol/L (ref 22–32)
Calcium: 9 mg/dL (ref 8.9–10.3)
Chloride: 97 mmol/L — ABNORMAL LOW (ref 98–111)
Creatinine, Ser: 1.87 mg/dL — ABNORMAL HIGH (ref 0.61–1.24)
GFR, Estimated: 33 mL/min — ABNORMAL LOW (ref >60)
Glucose, Bld: 105 mg/dL — ABNORMAL HIGH (ref 70–99)
Potassium: 4.6 mmol/L (ref 3.5–5.1)
Sodium: 136 mmol/L (ref 135–145)

## 2023-09-02 MED ORDER — PIPERACILLIN-TAZOBACTAM 3.375 G IVPB
3.3750 g | Freq: Three times a day (TID) | INTRAVENOUS | Status: DC
  Administered 2023-09-03 – 2023-09-06 (×11): 3.375 g via INTRAVENOUS
  Filled 2023-09-02 (×12): qty 50

## 2023-09-02 MED ORDER — FUROSEMIDE 40 MG PO TABS
60.0000 mg | ORAL_TABLET | Freq: Every day | ORAL | Status: DC
  Administered 2023-09-03 – 2023-09-06 (×4): 60 mg via ORAL
  Filled 2023-09-02 (×4): qty 1

## 2023-09-02 MED ORDER — HEPARIN (PORCINE) 25000 UT/250ML-% IV SOLN
1200.0000 [IU]/h | INTRAVENOUS | Status: AC
  Administered 2023-09-02: 1150 [IU]/h via INTRAVENOUS
  Administered 2023-09-03: 1350 [IU]/h via INTRAVENOUS
  Administered 2023-09-04 – 2023-09-05 (×2): 1200 [IU]/h via INTRAVENOUS
  Filled 2023-09-02 (×4): qty 250

## 2023-09-02 MED ORDER — VANCOMYCIN HCL 1250 MG/250ML IV SOLN
1250.0000 mg | INTRAVENOUS | Status: DC
  Filled 2023-09-02: qty 250

## 2023-09-02 MED ORDER — TAMSULOSIN HCL 0.4 MG PO CAPS
0.4000 mg | ORAL_CAPSULE | Freq: Every day | ORAL | Status: DC
  Administered 2023-09-03 – 2023-09-06 (×4): 0.4 mg via ORAL
  Filled 2023-09-02 (×4): qty 1

## 2023-09-02 MED ORDER — PRAVASTATIN SODIUM 10 MG PO TABS
20.0000 mg | ORAL_TABLET | Freq: Every evening | ORAL | Status: DC
  Administered 2023-09-03 – 2023-09-05 (×3): 20 mg via ORAL
  Filled 2023-09-02 (×4): qty 2

## 2023-09-02 MED ORDER — SODIUM CHLORIDE 0.9% FLUSH
3.0000 mL | Freq: Two times a day (BID) | INTRAVENOUS | Status: DC
  Administered 2023-09-02 – 2023-09-05 (×3): 3 mL via INTRAVENOUS

## 2023-09-02 MED ORDER — ONDANSETRON HCL 4 MG/2ML IJ SOLN: 4.0000 mg | Freq: Four times a day (QID) | INTRAMUSCULAR | Status: DC | PRN

## 2023-09-02 MED ORDER — PIPERACILLIN-TAZOBACTAM 3.375 G IVPB 30 MIN: 3.3750 g | Freq: Three times a day (TID) | INTRAVENOUS | Status: DC

## 2023-09-02 MED ORDER — ACETAMINOPHEN 325 MG PO TABS: 650.0000 mg | ORAL_TABLET | ORAL | Status: DC | PRN

## 2023-09-02 MED ORDER — PIPERACILLIN-TAZOBACTAM 3.375 G IVPB 30 MIN
3.3750 g | Freq: Once | INTRAVENOUS | Status: AC
  Administered 2023-09-02: 3.375 g via INTRAVENOUS
  Filled 2023-09-02: qty 50

## 2023-09-02 MED ORDER — TRAZODONE HCL 50 MG PO TABS
50.0000 mg | ORAL_TABLET | Freq: Every evening | ORAL | Status: DC | PRN
  Administered 2023-09-02 – 2023-09-03 (×2): 50 mg via ORAL
  Filled 2023-09-02 (×2): qty 1

## 2023-09-02 MED ORDER — SODIUM CHLORIDE 0.9 % IV SOLN: 250.0000 mL | INTRAVENOUS | Status: AC | PRN

## 2023-09-02 MED ORDER — METOPROLOL TARTRATE 50 MG PO TABS
50.0000 mg | ORAL_TABLET | Freq: Two times a day (BID) | ORAL | Status: DC
  Administered 2023-09-02 – 2023-09-06 (×8): 50 mg via ORAL
  Filled 2023-09-02 (×8): qty 1

## 2023-09-02 MED ORDER — SODIUM CHLORIDE 0.9% FLUSH: 3.0000 mL | INTRAVENOUS | Status: DC | PRN

## 2023-09-02 MED ORDER — VANCOMYCIN HCL 1500 MG/300ML IV SOLN
1500.0000 mg | Freq: Once | INTRAVENOUS | Status: AC
  Administered 2023-09-02: 1500 mg via INTRAVENOUS
  Filled 2023-09-02: qty 300

## 2023-09-02 MED ORDER — ACETAMINOPHEN 500 MG PO TABS
500.0000 mg | ORAL_TABLET | ORAL | Status: DC
  Administered 2023-09-03 – 2023-09-04 (×6): 500 mg via ORAL
  Administered 2023-09-04: 250 mg via ORAL
  Administered 2023-09-04 – 2023-09-06 (×9): 500 mg via ORAL
  Filled 2023-09-02 (×19): qty 1

## 2023-09-02 MED ORDER — POLYETHYLENE GLYCOL 3350 17 G PO PACK: 17.0000 g | PACK | Freq: Every day | ORAL | Status: DC | PRN

## 2023-09-02 MED ORDER — HYDROCODONE-ACETAMINOPHEN 7.5-325 MG PO TABS
1.0000 | ORAL_TABLET | ORAL | Status: DC
  Administered 2023-09-02 – 2023-09-06 (×19): 1 via ORAL
  Filled 2023-09-02 (×20): qty 1

## 2023-09-02 NOTE — Progress Notes (Signed)
 PHARMACY - ANTICOAGULATION CONSULT NOTE  Pharmacy Consult for IV heparin  Indication: atrial fibrillation  No Known Allergies  Patient Measurements: Height: 6' (182.9 cm) Weight: 76.9 kg (169 lb 8.5 oz) IBW/kg (Calculated) : 77.6 Heparin Dosing Weight: 76.9 kg   Vital Signs: Temp: 98.3 F (36.8 C) (03/08 2300) Temp Source: Oral (03/08 2300) BP: 167/75 (03/08 2213) Pulse Rate: 78 (03/08 2200)  Labs: Recent Labs    09/02/23 1658  HGB 10.0*  HCT 30.9*  PLT 299  CREATININE 1.87*    Estimated Creatinine Clearance: 26.8 mL/min (A) (by C-G formula based on SCr of 1.87 mg/dL (H)).   Medical History: Past Medical History:  Diagnosis Date   Atrial fibrillation (HCC)    Bradycardia    Cardiomyopathy 06/28/2003   a. Possibly alcoholic; b. cath in 2005-50% ostial D1, PCW of 12, EF of 35-40%;  c. EF of 0.25 in 11/2003;  d. 40-45% in 05/2004;  e. 25% in 10/2008 by echo;  f. 04/2015 Echo: EF 40-45%, diff HK, sev inflat/inf HK, Gr 1 DD, mild AI, triv TR.   Carotid artery disease (HCC)    CKD (chronic kidney disease) stage 3, GFR 30-59 ml/min (HCC)    Hyperlipidemia    Left bundle branch block    Prostate carcinoma [facility])    Syncope    [place] CRT-P 07/03/15 Dr. Ladona Ridgel   Assessment: KATIE MOCH is a 89 y.o. year old male admitted on 09/02/2023 with concern for afib. On eliquis for anticoagulation prior to admission (last dose 3/8 0800). Holding DOAC therapy with concern for pacemaker pocket infection with plans for possible device removal. Pharmacy consulted to dose heparin.  Goal of Therapy:  Heparin level 0.3-0.7 units/ml aPTT 66-102 seconds Monitor platelets by anticoagulation protocol: Yes   Plan:  Heparin infusion at 1150 units/hr (no bolus) 8 aPTT  Daily aPTT, heparin level, CBC, and monitoring for bleeding F/u plans for anticoagulation   Thank you for allowing pharmacy to participate in this patient's care.  Marja Kays, PharmD Emergency Medicine Clinical  Pharmacist 09/02/2023,11:30 PM

## 2023-09-02 NOTE — Progress Notes (Signed)
 ED Pharmacy Antibiotic Sign Off An antibiotic consult was received from an ED provider for vancomycin and zosyn per pharmacy dosing for wound infection. A chart review was completed to assess appropriateness.   The following one time order(s) were placed:  Vancomycin 1500mg  x1  Zosyn 3.375mg  x1   Further antibiotic and/or antibiotic pharmacy consults should be ordered by the admitting provider if indicated.   Thank you for allowing pharmacy to be a part of this patient's care.   Estill Batten, PharmD, BCCCP  Clinical Pharmacist 09/02/23 5:54 PM

## 2023-09-02 NOTE — ED Triage Notes (Signed)
 Pt had pacemaker placed 4 wks ago. Pt's daughter wanted to make sure the pacemaker incision isn't infected. The skin near the beginning of the incision an end of the incision is erythematous. Pt's daughter states she squeezed pus out "the size of rice" out of the incision.

## 2023-09-02 NOTE — ED Notes (Signed)
 Both sets of blood cultures obtained prior to abx

## 2023-09-02 NOTE — Telephone Encounter (Signed)
 Paged by the patient's daughter regarding redness on top of the patient's pacemaker.  This is a 89 year old patient who recently underwent device change out on 07/31/2023.  Daughter reported some redness across the incision site measuring 2 inches x 3 inches.  The pacemaker pocket does not feel hot according to the daughter.  There is no significant pain when the daughter pushed on the pacemaker pocket.  However according to her, she has been able to squeeze out a rice grain sized yellowish pus from the pacemaker incision site, this is clearly not normal.  I discussed the case with DOD Dr. Elease Hashimoto, we recommend the patient' daughter to take the patient to [facility] emergency room so the pacemaker site can be evaluated to make sure he does not have pacemaker pocket infection.  Note, patient is due for upcoming hip replacement surgery in April.

## 2023-09-02 NOTE — H&P (Signed)
 Cardiology Admission History and Physical   Patient ID: Max Cole MRN: [id number]; DOB: [birth year]   Admission date: 09/02/2023  PCP:  Carylon Perches, MD   [facility] Providers Cardiologist:  Lewayne Bunting, MD  Electrophysiologist:  Lewayne Bunting, MD  {    Chief Complaint:  infected pacemaker pocket  Patient Profile & HPI  Max Cole is a 89 y.o. male with  Afib (on apixaban), Cardiomyopathy with recovered EF, CKD, carotid artery disease, HLD, LBBB, syncope (s/p [place] CRT-P, 07/13/15 s/p gen exchange 07/31/23), chronic hypoxic respiratory failure (on 3-4L with ambulation), who is being seen 09/02/2023 for the evaluation of an infected device pocket.  Patient recently underwent gen exchange on 07/31/23. The patient's daughter initially called in after she noticed some redness across the incision that was approximately 2inx3in. She denied any warmth or pain on palpation, however, she was able to express a small amount of yellowish pus from the pacemaker incision site. Discussed with Dr. Elease Hashimoto, with plan to admit to Cardiology.   Patient evaluated in the ER with daughter (CRNA) at bed side. Patient had been recently seen on 2/26 in EP clinic at which time his PPM site appeared to look normal. However, the patient's daughter noticed increasing redness and was able to express purulent discharge. The patient denies any pain, tenderness, trauma to the area. He also denies any chest pain, palpitations, shortness of breath. He does note ongoing hip pain secondary to his avascular necrosis and had planned upcoming surgery.   ER Course Vitals: HR 65-71, BP 132-183/69-73, RR 16-18, Sat 100% on RA, Labs: WBC 8.9, Hgb 10.0, plt 299, K 4.6, Cr 1.87 (baseline 1.5-1.6). Blood and wound cultures were obtained.  Interventions: patient was found to have purulent drainage expressed from his pacemaker pocket, concerning for infection. Wound and blood cultures were obtained. Patient was started on  vanc/zosyn.   PMH   Past Medical History:  Diagnosis Date   Atrial fibrillation (HCC)    Bradycardia    Cardiomyopathy 06/28/2003   a. Possibly alcoholic; b. cath in 2005-50% ostial D1, PCW of 12, EF of 35-40%;  c. EF of 0.25 in 11/2003;  d. 40-45% in 05/2004;  e. 25% in 10/2008 by echo;  f. 04/2015 Echo: EF 40-45%, diff HK, sev inflat/inf HK, Gr 1 DD, mild AI, triv TR.   Carotid artery disease (HCC)    CKD (chronic kidney disease) stage 3, GFR 30-59 ml/min (HCC)    Hyperlipidemia    Left bundle branch block    Prostate carcinoma [facility])    Syncope    [place] CRT-P 07/03/15 Dr. Ladona Ridgel    Past Surgical History:  Procedure Laterality Date   CATARACT EXTRACTION     Right   COLONOSCOPY N/A 08/16/2012   Procedure: COLONOSCOPY;  Surgeon: Malissa Hippo, MD;  Location: [facility] ENDO [facility];  Service: Endoscopy;  Laterality: N/A;  930   COLONOSCOPY N/A 10/21/2015   Procedure: COLONOSCOPY;  Surgeon: Malissa Hippo, MD;  Location: [facility] ENDO SUITE;  Service: Endoscopy;  Laterality: N/A;  1200   COLONOSCOPY W/ POLYPECTOMY  2010   Diverticulosis   EP IMPLANTABLE DEVICE N/A 07/03/2015   Procedure: BiV Pacemaker Insertion CRT-P;  Surgeon: Marinus Maw, MD;  Location: [facility] INVASIVE CV LAB;  Service: Cardiovascular;  Laterality: N/A;   EYE SURGERY Right    Cataract   HERNIA REPAIR     PPM GENERATOR CHANGEOUT N/A 07/31/2023   Procedure: PPM GENERATOR CHANGEOUT;  Surgeon: Marinus Maw, MD;  Location: [facility] INVASIVE CV LAB;  Service: Cardiovascular;  Laterality: N/A;     Medications Prior to Admission: Prior to Admission medications   Medication Sig Start Date End Date Taking? Authorizing Provider  apixaban (ELIQUIS) 2.5 MG TABS tablet Take 1 tablet (2.5 mg total) by mouth 2 (two) times daily. 06/10/20   Sharee Holster, NP  ferrous sulfate 325 (65 FE) MG tablet Take 1 tablet (325 mg total) by mouth daily with breakfast. 06/10/20   Sharee Holster, NP  furosemide (LASIX) 40 MG tablet Take 60 mg by mouth daily.     [provider]  HYDROcodone-acetaminophen (NORCO) 7.5-325 MG tablet Take 1 tablet by mouth every 4 (four) hours as needed for severe pain (pain score 7-10).    [provider]  methocarbamol (ROBAXIN) 500 MG tablet Take 500 mg by mouth every 8 (eight) hours as needed for muscle spasms.    [provider]  metoprolol tartrate (LOPRESSOR) 50 MG tablet Take 1 tablet (50 mg total) by mouth 2 (two) times daily. 08/23/23   Sheilah Pigeon, PA-C  oxyCODONE (OXY IR/ROXICODONE) 5 MG immediate release tablet Take 5 mg by mouth every 4 (four) hours as needed for severe pain (pain score 7-10). 07/03/23   [provider]  OXYGEN Inhale 2 L into the lungs continuous. 06/04/20   [provider]  polyethylene glycol powder (GLYCOLAX/MIRALAX) 17 GM/SCOOP powder Take 17 g by mouth daily as needed for moderate constipation.    [provider]  pravastatin (PRAVACHOL) 20 MG tablet Take 1 tablet (20 mg total) by mouth every evening. 06/10/20   Sharee Holster, NP  tamsulosin (FLOMAX) 0.4 MG CAPS capsule Take 0.4 mg by mouth daily. 03/28/22   [provider]  traZODone (DESYREL) 50 MG tablet Take 50 mg by mouth at bedtime. 07/17/23   [provider]     Allergies:   No Known Allergies  Social History:   Social History   Socioeconomic History   Marital status: Married    Spouse name: Not on file   Number of children: Not on file   Years of education: Not on file   Highest education level: Not on file  Occupational History   Not on file  Tobacco Use   Smoking status: Former    Current packs/day: 0.00    Average packs/day: 1 pack/day for 30.0 years (30.0 ttl pk-yrs)    Types: Cigarettes    Start date: 09/29/1943    Quit date: 09/28/1973    Years since quitting: 49.9   Smokeless tobacco: Never  Vaping Use   Vaping status: Never Used  Substance and Sexual Activity   Alcohol use: Not Currently    Alcohol/week: 5.0 standard drinks of  alcohol    Types: 5 Glasses of wine per week    Comment: History of excessive alcohol use; Alternates between glass of wine vs. Liquor drink 5 days per week (04/2015)   Drug use: No   Sexual activity: Not on file  Other Topics Concern   Not on file  Social History Narrative   Not on file   Social Drivers of Health   Financial Resource Strain: Low Risk  (10/12/2022)   Overall Financial Resource Strain (CARDIA)    Difficulty of Paying Living Expenses: Not very hard  Food Insecurity: No Food Insecurity (09/02/2023)   Hunger Vital Sign    Worried About Running Out of Food in the Last Year: Never true    Ran Out of Food  in the Last Year: Never true  Transportation Needs: No Transportation Needs (09/02/2023)   PRAPARE - Administrator, Civil Service (Medical): No    Lack of Transportation (Non-Medical): No  Physical Activity: Insufficiently Active (10/14/2022)   Exercise Vital Sign    Days of Exercise per Week: 3 days    Minutes of Exercise per Session: 30 min  Stress: Not on file  Social Connections: Unknown (09/02/2023)   Social Connection and Isolation Panel [NHANES]    Frequency of Communication with Friends and Family: Patient declined    Frequency of Social Gatherings with Friends and Family: Patient declined    Attends Religious Services: Never    Database administrator or Organizations: Patient declined    Attends Banker Meetings: Patient declined    Marital Status: Patient declined  Intimate Partner Violence: Not At Risk (09/02/2023)   Humiliation, Afraid, Rape, and Kick questionnaire    Fear of Current or Ex-Partner: No    Emotionally Abused: No    Physically Abused: No    Sexually Abused: No    Family History:   The patient's family history includes Hypertension in his mother.    ROS:  Please see the history of present illness.  All other ROS reviewed and negative.     Physical Exam/Data:   Vitals:   09/02/23 2100 09/02/23 2200 09/02/23 2213  09/02/23 2300  BP:   (!) 167/75   Pulse:  78    Resp:  15    Temp:   98.5 F (36.9 C) 98.3 F (36.8 C)  TempSrc:   Oral Oral  SpO2: 100% 100%    Weight:      Height:        Intake/Output Summary (Last 24 hours) at 09/02/2023 2306 Last data filed at 09/02/2023 2157 Gross per 24 hour  Intake 342.72 ml  Output --  Net 342.72 ml      09/02/2023    4:27 PM 08/23/2023   11:18 AM 07/31/2023    9:59 AM  Last 3 Weights  Weight (lbs) 169 lb 8.5 oz 169 lb 9.6 oz 170 lb  Weight (kg) 76.9 kg 76.93 kg 77.111 kg     Body mass index is 22.99 kg/m.  General:  Well nourished, well developed, in no acute distress HEENT: normal Neck: no JVD Vascular: No carotid bruits; Distal pulses 2+ bilaterally   Cardiac:  normal S1, S2; RRR; no murmur, pacemaker site with moderate erythema with poor healing along incision site Lungs:  clear to auscultation bilaterally, no wheezing, rhonchi or rales  Abd: soft, nontender, no hepatomegaly  Ext: no edema Musculoskeletal:  No deformities, BUE and BLE strength normal and equal Skin: warm and dry  Neuro:   no focal abnormalities noted Psych:  Normal affect   Relevant CV Studies: Device interrogation 08/23/23 in-clinic Pacemaker check. Thresholds, sensing, and impedance WNL or stable for patient over time.  Estimated longevity __11 years__ . Pt enrolled in remote follow-up.  episodes labled SVT and nunsustained, + PMTs  morphology of all of his episodes appears the same  at least one is V>A consistent with NSVT  another clearly A>V, same venricular morphology  others are 1:1  these are not new for him, noted same morphology tachyarrhythmia going back years  + hx of Afib  wound well healed s/p gen change   TTE 09/30/21  1. Left ventricular ejection fraction, by estimation, is 60 to 65%. The left ventricle has normal function. The left  ventricle has no regional wall motion abnormalities. There is mild left ventricular hypertrophy. Left ventricular diastolic  parameters are consistent with Grade I diastolic dysfunction (impaired relaxation).   2. Right ventricular systolic function is normal. The right ventricular size is normal. There is normal pulmonary artery systolic pressure. The estimated right ventricular systolic pressure is 35.9 mmHg.   3. Left atrial size was moderately dilated.   4. Right atrial size was moderately dilated.   5. The mitral valve is degenerative. Mild mitral valve regurgitation.   6. The aortic valve is tricuspid. There is moderate calcification of the aortic valve. Aortic valve regurgitation is mild. Aortic valve  sclerosis/calcification is present, without any evidence of aortic stenosis. Aortic regurgitation PHT measures 535 msec. Aortic valve mean gradient measures 7.0 mmHg.   7. The inferior vena cava is normal in size with greater than 50% respiratory variability, suggesting right atrial pressure of 3 mmHg.   Laboratory Data: High Sensitivity Troponin:  No results for input(s): "TROPONINIHS" in the last 720 hours.    Chemistry Recent Labs  Lab 09/02/23 1658  NA 136  K 4.6  CL 97*  CO2 27  GLUCOSE 105*  BUN 44*  CREATININE 1.87*  CALCIUM 9.0  GFRNONAA 33*  ANIONGAP 12    No results for input(s): "PROT", "ALBUMIN", "AST", "ALT", "ALKPHOS", "BILITOT" in the last 168 hours. Lipids No results for input(s): "CHOL", "TRIG", "HDL", "LABVLDL", "LDLCALC", "CHOLHDL" in the last 168 hours. Hematology Recent Labs  Lab 09/02/23 1658  WBC 8.9  RBC 2.92*  HGB 10.0*  HCT 30.9*  MCV 105.8*  MCH 34.2*  MCHC 32.4  RDW 11.8  PLT 299   Thyroid No results for input(s): "TSH", "FREET4" in the last 168 hours. BNPNo results for input(s): "BNP", "PROBNP" in the last 168 hours.  DDimer No results for input(s): "DDIMER" in the last 168 hours.   Radiology/Studies:  DG Chest Port 1 View Result Date: 09/02/2023 CLINICAL DATA:  infection EXAM: PORTABLE CHEST 1 VIEW COMPARISON:  Chest x-ray 12/23/2021 FINDINGS: Left chest  wall triple lead pacemaker. The heart and mediastinal contours are unchanged. Atherosclerotic plaque. Question developing nodular like airspace opacity along the peripheral right mid lung zone. Chronic coarsened interstitial markings with no overt pulmonary edema. No pleural effusion. No pneumothorax. No acute osseous abnormality. IMPRESSION: 1. Question developing nodular like airspace opacity along the peripheral right mid lung zone. Followup PA and lateral chest X-ray is recommended in 3-4 weeks following therapy to ensure resolution and exclude underlying malignancy. 2.  Aortic Atherosclerosis (ICD10-I70.0). Electronically Signed   By: Tish Frederickson M.D.   On: 09/02/2023 18:53     Assessment and Plan:   #Pacemaker pocket infection Patient recently underwent gen exchange on 07/31/23 of CRT-P and was recently found to have an overlying cellulitis at his pacemaker/incision site; purulent drainage was able to be expressed. Patient otherwise has not exhibited systemic signs of infection. Will follow blood and wound cultures and empirically treat with vanc/zosyn. Will also obtain repeat TTE to assess for any vegetations. Anticipate he will likely need device removal.  --follow blood, wound cultures  --Continue vancomycin --Continue zosyn --obtain TTE --will likely need device removal --Consult ID once cultures result --Device interrogation in AM   #paroxysmal AF #HFpEF --continue metoprolol tartrate 50mg  bid --continue furosemide 60mg  every day  --hold home apixaban 2.5mg  bid --start heparin gtt   #Avascular necrosis of hip --continue home hydrocodone-acetaminophen 7.5-325mg  q4h (hold 0400 dose) --continue home acetaminophen 500mg  q4h (hold 0400 dose)  #  Mild Cr elevation Cr 1.87 on admission, baseline appears to be 1.5-1.63. Will continue to monitor   #Macrocytic anemia Patient with macrocytic anemia with hgb 10.0, mcv 105.8; will obtain folate, b12, and iron panel (though normal  RDW) --B12, folate, Iron panel   #Nodular airspace opacity in the right mid lung Patient was incidentally found to have a nodular like airspace opacity along the peripheral right mid lung zone --follow-up PA/Lateral CXR in 3-4 weeks to ensure resolution/assess for malignancy   Chronic conditions:  #BPH: continue home tamsulosin 0.4mg  every day #Constipation: continue home miralax 17g every day prn #Sleep: continue home trazodone 50mg  at bedtime prn  #HLD: continue home pravastatin 20mg  at bedtime   Routine Diet: heart healthy DVT prophylaxis: Heparin gtt Code Status: DNR/DNI, confirmed with patient and daughter at bedside Family: updated daughter at bedside    Risk Assessment/Risk Scores:   CHA2DS2-VASc Score = 4  This indicates a 4.8% annual risk of stroke. The patient's score is based upon: CHF History: 1 HTN History: 0 Diabetes History: 0 Stroke History: 0 Vascular Disease History: 1 Age Score: 2 Gender Score: 0  Code Status: Do Not Resuscitate (DNR)  Severity of Illness: The appropriate patient status for this patient is INPATIENT. Inpatient status is judged to be reasonable and necessary in order to provide the required intensity of service to ensure the patient's safety. The patient's presenting symptoms, physical exam findings, and initial radiographic and laboratory data in the context of their chronic comorbidities is felt to place them at high risk for further clinical deterioration. Furthermore, it is not anticipated that the patient will be medically stable for discharge from the hospital within 2 midnights of admission.   * I certify that at the point of admission it is my clinical judgment that the patient will require inpatient hospital care spanning beyond 2 midnights from the point of admission due to high intensity of service, high risk for further deterioration and high frequency of surveillance required.*   For questions or updates, please contact [facility] Please consult [email or web address] for contact info under     Signed, Rubye Oaks, MD  09/02/2023 11:06 PM

## 2023-09-02 NOTE — Progress Notes (Signed)
 Pharmacy Antibiotic Note  Max Cole is a 89 y.o. male admitted on 09/02/2023 with  concerns for wound infection around recent pacemaker placement .  Pharmacy has been consulted for vancomycin dosing. Patient's renal function near baseline, Scr: 1.87 baseline appears to hover around 1.6.   Plan: Zosyn 3.375 q8h per MD.  Vancomycin 1500mg  load, then 1250mg  q48h for eAUC: 420, Scr: 1.87, VD: 0.72 Follow culture data for de-escalation.  Monitor renal function for dose adjustments as indicated.  F/u wound cx data.    Height: 6' (182.9 cm) Weight: 76.9 kg (169 lb 8.5 oz) IBW/kg (Calculated) : 77.6  Temp (24hrs), Avg:98.2 F (36.8 C), Min:98.2 F (36.8 C), Max:98.2 F (36.8 C)  Recent Labs  Lab 09/02/23 1658  WBC 8.9  CREATININE 1.87*    Estimated Creatinine Clearance: 26.8 mL/min (A) (by C-G formula based on SCr of 1.87 mg/dL (H)).    No Known Allergies  Antimicrobials this admission: Zosyn 3/8 >>  Vancomycin 3/8 >>   Microbiology results: 3/8 BCx:  3/8 Wound Cx:    Thank you for allowing pharmacy to be a part of this patient's care.  Estill Batten, PharmD, BCCCP  09/02/2023 7:33 PM

## 2023-09-02 NOTE — ED Provider Notes (Signed)
 [facility] AT [facility] Provider Note   CSN: [id number] Arrival date & time: 09/02/23  1617     History  No chief complaint on file.   Max Cole is a 89 y.o. male.  Patient complains of an infection to the incision of his pacemaker.  Pt had insertion on 2/02.  The area around the pacemaker is red.  Patient has been having yellow drainage.  Patient's family member spoke with cardiology and was advised to come to the emergency department for evaluation.  Patient denies any fever or chills.  Patient reports the area is a little sore.  Patient is currently on a blood thinner.  The history is provided by the patient and a relative.       Home Medications Prior to Admission medications   Medication Sig Start Date End Date Taking? Authorizing Provider  apixaban (ELIQUIS) 2.5 MG TABS tablet Take 1 tablet (2.5 mg total) by mouth 2 (two) times daily. 06/10/20   Sharee Holster, NP  ferrous sulfate 325 (65 FE) MG tablet Take 1 tablet (325 mg total) by mouth daily with breakfast. 06/10/20   Sharee Holster, NP  furosemide (LASIX) 40 MG tablet Take 60 mg by mouth daily.    [provider]  HYDROcodone-acetaminophen (NORCO) 7.5-325 MG tablet Take 1 tablet by mouth every 4 (four) hours as needed for severe pain (pain score 7-10).    [provider]  methocarbamol (ROBAXIN) 500 MG tablet Take 500 mg by mouth every 8 (eight) hours as needed for muscle spasms.    [provider]  metoprolol tartrate (LOPRESSOR) 50 MG tablet Take 1 tablet (50 mg total) by mouth 2 (two) times daily. 08/23/23   Sheilah Pigeon, PA-C  oxyCODONE (OXY IR/ROXICODONE) 5 MG immediate release tablet Take 5 mg by mouth every 4 (four) hours as needed for severe pain (pain score 7-10). 07/03/23   [provider]  OXYGEN Inhale 2 L into the lungs continuous. 06/04/20   [provider]  polyethylene glycol powder (GLYCOLAX/MIRALAX) 17 GM/SCOOP powder  Take 17 g by mouth daily as needed for moderate constipation.    [provider]  pravastatin (PRAVACHOL) 20 MG tablet Take 1 tablet (20 mg total) by mouth every evening. 06/10/20   Sharee Holster, NP  tamsulosin (FLOMAX) 0.4 MG CAPS capsule Take 0.4 mg by mouth daily. 03/28/22   [provider]  traZODone (DESYREL) 50 MG tablet Take 50 mg by mouth at bedtime. 07/17/23   [provider]      Allergies    Patient has no known allergies.    Review of Systems   Review of Systems  All other systems reviewed and are negative.   Physical Exam Updated Vital Signs BP 132/69   Pulse 71   Temp 98.2 F (36.8 C) (Oral)   Resp 18   Ht 6' (1.829 m)   Wt 76.9 kg   SpO2 100%   BMI 22.99 kg/m  Physical Exam Vitals and nursing note reviewed.  Constitutional:      Appearance: He is well-developed.  HENT:     Head: Normocephalic.  Cardiovascular:     Rate and Rhythm: Normal rate.  Pulmonary:     Effort: Pulmonary effort is normal.     Comments: Redness around pacemaker site left chest purulent yellow drainage to palpation. Abdominal:     General: Abdomen is flat. There is no distension.  Musculoskeletal:  General: Normal range of motion.  Skin:    General: Skin is warm.  Neurological:     General: No focal deficit present.     Mental Status: He is alert and oriented to person, place, and time.     ED Results / Procedures / Treatments   Labs (all labs ordered are listed, but only abnormal results are displayed) Labs Reviewed  BASIC METABOLIC PANEL - Abnormal; Notable for the following components:      Result Value   Chloride 97 (*)    Glucose, Bld 105 (*)    BUN 44 (*)    Creatinine, Ser 1.87 (*)    GFR, Estimated 33 (*)    All other components within normal limits  CBC WITH DIFFERENTIAL/PLATELET - Abnormal; Notable for the following components:   RBC 2.92 (*)    Hemoglobin 10.0 (*)    HCT 30.9 (*)    MCV 105.8 (*)    MCH 34.2 (*)     Monocytes Absolute 1.1 (*)    All other components within normal limits    EKG None  Radiology No results found.  Procedures Procedures    Medications Ordered in ED Medications - No data to display  ED Course/ Medical Decision Making/ A&P                                 Medical Decision Making Pt complains of infection to his pacemaker site.  Pt has drainage   Amount and/or Complexity of Data Reviewed Independent Historian: caregiver    Details: Pt is here with family   Labs: ordered. Decision-making details documented in ED Course.    Details: Labs ordered reviewed and interpreted BUN is 44.  Creat is 1.87.  Radiology: ordered. Discussion of management or test interpretation with external provider(s): Cardiology consulted.  They will see and admit pt               Final Clinical Impression(s) / ED Diagnoses Final diagnoses:  Infected pacemaker, initial encounter [facility])    Rx / DC Orders ED Discharge Orders     None         Elson Areas, PA-C 09/02/23 1755    Rozelle Logan, DO 09/02/23 2142

## 2023-09-03 ENCOUNTER — Inpatient Hospital Stay (HOSPITAL_COMMUNITY)

## 2023-09-03 DIAGNOSIS — I38 Endocarditis, valve unspecified: Secondary | ICD-10-CM | POA: Diagnosis not present

## 2023-09-03 DIAGNOSIS — T827XXA Infection and inflammatory reaction due to other cardiac and vascular devices, implants and grafts, initial encounter: Secondary | ICD-10-CM

## 2023-09-03 LAB — COMPREHENSIVE METABOLIC PANEL
ALT: 10 U/L (ref 0–44)
AST: 17 U/L (ref 15–41)
Albumin: 3.1 g/dL — ABNORMAL LOW (ref 3.5–5.0)
Alkaline Phosphatase: 47 U/L (ref 38–126)
Anion gap: 10 (ref 5–15)
BUN: 35 mg/dL — ABNORMAL HIGH (ref 8–23)
CO2: 27 mmol/L (ref 22–32)
Calcium: 8.8 mg/dL — ABNORMAL LOW (ref 8.9–10.3)
Chloride: 103 mmol/L (ref 98–111)
Creatinine, Ser: 1.72 mg/dL — ABNORMAL HIGH (ref 0.61–1.24)
GFR, Estimated: 37 mL/min — ABNORMAL LOW (ref >60)
Glucose, Bld: 114 mg/dL — ABNORMAL HIGH (ref 70–99)
Potassium: 4 mmol/L (ref 3.5–5.1)
Sodium: 140 mmol/L (ref 135–145)
Total Bilirubin: 0.4 mg/dL (ref 0.0–1.2)
Total Protein: 5.8 g/dL — ABNORMAL LOW (ref 6.5–8.1)

## 2023-09-03 LAB — CBC WITH DIFFERENTIAL/PLATELET
Abs Immature Granulocytes: 0.03 10*3/uL (ref 0.00–0.07)
Basophils Absolute: 0.1 10*3/uL (ref 0.0–0.1)
Basophils Relative: 1 %
Eosinophils Absolute: 0.2 10*3/uL (ref 0.0–0.5)
Eosinophils Relative: 3 %
HCT: 27.5 % — ABNORMAL LOW (ref 39.0–52.0)
Hemoglobin: 9.1 g/dL — ABNORMAL LOW (ref 13.0–17.0)
Immature Granulocytes: 0 %
Lymphocytes Relative: 22 %
Lymphs Abs: 1.7 10*3/uL (ref 0.7–4.0)
MCH: 34.5 pg — ABNORMAL HIGH (ref 26.0–34.0)
MCHC: 33.1 g/dL (ref 30.0–36.0)
MCV: 104.2 fL — ABNORMAL HIGH (ref 80.0–100.0)
Monocytes Absolute: 1.1 10*3/uL — ABNORMAL HIGH (ref 0.1–1.0)
Monocytes Relative: 14 %
Neutro Abs: 4.6 10*3/uL (ref 1.7–7.7)
Neutrophils Relative %: 60 %
Platelets: 249 10*3/uL (ref 150–400)
RBC: 2.64 MIL/uL — ABNORMAL LOW (ref 4.22–5.81)
RDW: 11.7 % (ref 11.5–15.5)
WBC: 7.6 10*3/uL (ref 4.0–10.5)
nRBC: 0 % (ref 0.0–0.2)

## 2023-09-03 LAB — ECHOCARDIOGRAM COMPLETE
AR max vel: 1.37 cm2
AV Area VTI: 1.22 cm2
AV Area mean vel: 1.26 cm2
AV Mean grad: 10 mmHg
AV Peak grad: 18.3 mmHg
Ao pk vel: 2.14 m/s
Area-P 1/2: 2.32 cm2
Height: 72 in
P 1/2 time: 529 ms
S' Lateral: 2.8 cm
Weight: 2628.8 [oz_av]

## 2023-09-03 LAB — APTT
aPTT: 51 s — ABNORMAL HIGH (ref 24–36)
aPTT: 95 s — ABNORMAL HIGH (ref 24–36)

## 2023-09-03 LAB — HEPARIN LEVEL (UNFRACTIONATED): Heparin Unfractionated: 1.02 [IU]/mL — ABNORMAL HIGH (ref 0.30–0.70)

## 2023-09-03 LAB — C-REACTIVE PROTEIN: CRP: <0.5 mg/dL (ref <1.0)

## 2023-09-03 LAB — MAGNESIUM: Magnesium: 2.2 mg/dL (ref 1.7–2.4)

## 2023-09-03 NOTE — Progress Notes (Signed)
 Progress Note  Patient Name: Max Cole Date of Encounter: 09/03/2023  Primary Cardiologist: Lewayne Bunting, MD   Subjective   Feels well. Primary complaint is right hip pain. He hasn't had significant pain at his device site.  Inpatient Medications    Scheduled Meds:  acetaminophen  500 mg Oral Q4H   furosemide  60 mg Oral Daily   HYDROcodone-acetaminophen  1 tablet Oral Q4H   metoprolol tartrate  50 mg Oral BID   pravastatin  20 mg Oral QPM   sodium chloride flush  3 mL Intravenous Q12H   tamsulosin  0.4 mg Oral Daily   Continuous Infusions:  sodium chloride     heparin 1,150 Units/hr (09/03/23 0343)   piperacillin-tazobactam (ZOSYN)  IV 12.5 mL/hr at 09/03/23 0343   [START ON 09/04/2023] vancomycin     PRN Meds: sodium chloride, ondansetron (ZOFRAN) IV, polyethylene glycol, sodium chloride flush, traZODone   Vital Signs    Vitals:   09/02/23 2300 09/03/23 0436 09/03/23 0444 09/03/23 0719  BP:   (!) 155/82 (!) 149/67  Pulse:    62  Resp:   13 18  Temp: 98.3 F (36.8 C)  98.5 F (36.9 C) 97.8 F (36.6 C)  TempSrc: Oral  Oral Oral  SpO2:    99%  Weight:  74.5 kg    Height:        Intake/Output Summary (Last 24 hours) at 09/03/2023 0951 Last data filed at 09/03/2023 0919 Gross per 24 hour  Intake 1101.58 ml  Output 900 ml  Net 201.58 ml   Filed Weights   09/02/23 1627 09/03/23 0436  Weight: 76.9 kg 74.5 kg    Telemetry    Paced rhythm - Personally Reviewed  ECG    A-sensed, V-paced - Personally Reviewed  Physical Exam   GEN: No acute distress.   Neck: No JVD Cardiac: RRR, no murmurs, rubs, or gallops.  Device site with some dried eschar at lateral margin. No fluctuance or tenderness. Redness noted around the incision and over the pocket Respiratory: Clear to auscultation bilaterally. GI: Soft, nontender, non-distended  MS: No edema; No deformity. Neuro:  Nonfocal  Psych: Normal affect   Labs    Chemistry Recent Labs  Lab  09/02/23 1658 09/03/23 0407  NA 136 140  K 4.6 4.0  CL 97* 103  CO2 27 27  GLUCOSE 105* 114*  BUN 44* 35*  CREATININE 1.87* 1.72*  CALCIUM 9.0 8.8*  PROT  --  5.8*  ALBUMIN  --  3.1*  AST  --  17  ALT  --  10  ALKPHOS  --  47  BILITOT  --  0.4  GFRNONAA 33* 37*  ANIONGAP 12 10     Hematology Recent Labs  Lab 09/02/23 1658 09/03/23 0407  WBC 8.9 7.6  RBC 2.92* 2.64*  HGB 10.0* 9.1*  HCT 30.9* 27.5*  MCV 105.8* 104.2*  MCH 34.2* 34.5*  MCHC 32.4 33.1  RDW 11.8 11.7  PLT 299 249    Cardiac EnzymesNo results for input(s): "TROPONINI" in the last 168 hours. No results for input(s): "TROPIPOC" in the last 168 hours.   BNPNo results for input(s): "BNP", "PROBNP" in the last 168 hours.   DDimer No results for input(s): "DDIMER" in the last 168 hours.   Summary of Pertinent studies    TTE: Ordered    Patient Profile     89 y.o. male  with  Afib (on apixaban), Cardiomyopathy with recovered EF, CKD, carotid artery disease,  HLD, LBBB, syncope (s/p [place] CRT-P, 07/13/15 s/p gen exchange 07/31/23), chronic hypoxic respiratory failure (on 3-4L with ambulation), who is being seen for the evaluation of an infected device pocket.   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; generator changed July 31, 2023 with antibiotic pouch No fluctuance, but skin overlying the pocket is erythematous Original device and leads placed January 2017 Underlying rhythm not documented on recent interrogation Continue Vancomycin and Zosyn  Cultures pending No systemic symptoms  CHF preserved EF TTE pending  Heart failure with preserved ejection fraction Appears compensated today TTE has been ordered  Paroxysmal atrial fibrillation On apixaban 2.5 mg twice daily, held Bridged with heparin drip for possible extraction  Avascular necrosis of the hip On hydrocodone/acetaminophen 7.5-325 Q4  Mild creatinine elevation Baseline creatinine  1.5-1.63 1.87 on admission -- 1.72 Continue to monitor  Macrocytic anemia Hemoglobin 9.1    For questions or updates, please contact [facility] Please consult [email or web address] for contact info under Cardiology/STEMI.      Signed, Maurice Small, MD 09/03/2023, 9:51 AM

## 2023-09-03 NOTE — Progress Notes (Signed)
  Echocardiogram 2D Echocardiogram has been performed.  Max Cole 09/03/2023, 3:40 PM

## 2023-09-03 NOTE — Progress Notes (Signed)
 PHARMACY - ANTICOAGULATION CONSULT NOTE  Pharmacy Consult for IV heparin  Indication: atrial fibrillation  No Known Allergies  Patient Measurements: Height: 6' (182.9 cm) Weight: 74.5 kg (164 lb 4.8 oz) IBW/kg (Calculated) : 77.6 Heparin Dosing Weight: 76.9 kg   Vital Signs: Temp: 98.8 F (37.1 C) (03/09 2004) Temp Source: Oral (03/09 2004) BP: 120/52 (03/09 2203) Pulse Rate: 61 (03/09 2004)  Labs: Recent Labs    09/02/23 1658 09/03/23 0407 09/03/23 1111 09/03/23 2118  HGB 10.0* 9.1*  --   --   HCT 30.9* 27.5*  --   --   PLT 299 249  --   --   APTT  --   --  51* 95*  HEPARINUNFRC  --   --  1.02*  --   CREATININE 1.87* 1.72*  --   --     Estimated Creatinine Clearance: 28.3 mL/min (A) (by C-G formula based on SCr of 1.72 mg/dL (H)).   Medical History: Past Medical History:  Diagnosis Date   Atrial fibrillation (HCC)    Bradycardia    Cardiomyopathy 06/28/2003   a. Possibly alcoholic; b. cath in 2005-50% ostial D1, PCW of 12, EF of 35-40%;  c. EF of 0.25 in 11/2003;  d. 40-45% in 05/2004;  e. 25% in 10/2008 by echo;  f. 04/2015 Echo: EF 40-45%, diff HK, sev inflat/inf HK, Gr 1 DD, mild AI, triv TR.   Carotid artery disease (HCC)    CKD (chronic kidney disease) stage 3, GFR 30-59 ml/min (HCC)    Hyperlipidemia    Left bundle branch block    Prostate carcinoma [facility])    Syncope    Boston CRT-P 07/03/15 Dr. Ladona Ridgel   Assessment: Max Cole is a 89 y.o. year old male admitted on 09/02/2023 with concern for afib. On eliquis for anticoagulation prior to admission (last dose 3/8 0800). Holding DOAC therapy with concern for pacemaker pocket infection with plans for possible device removal. Pharmacy consulted to dose heparin.  aPTT therapeutic (95) on 1350 units/hr. No issues with heparin infusion or signs of bleeding overnight per RN. CBC 9-10s, PLTc wnl  Goal of Therapy:  Heparin level 0.3-0.7 units/ml aPTT 66-102 seconds Monitor platelets by anticoagulation protocol:  Yes   Plan:  Continue Heparin infusion at 1350 units/hr (no bolus) F/u heparin level/aPTT in 8 hours and daily while correlating  Monitor daily heparin level and CBC Continue to monitor H&H   F/u plans for anticoagulation   Ruben Im, PharmD Clinical Pharmacist 09/03/2023 10:42 PM Please check AMION for all [facility] Pharmacy numbers

## 2023-09-03 NOTE — Plan of Care (Signed)

## 2023-09-03 NOTE — Progress Notes (Signed)
 PHARMACY - ANTICOAGULATION CONSULT NOTE  Pharmacy Consult for IV heparin  Indication: atrial fibrillation  No Known Allergies  Patient Measurements: Height: 6' (182.9 cm) Weight: 74.5 kg (164 lb 4.8 oz) IBW/kg (Calculated) : 77.6 Heparin Dosing Weight: 76.9 kg   Vital Signs: Temp: 97.8 F (36.6 C) (03/09 0719) Temp Source: Oral (03/09 0719) BP: 149/67 (03/09 0719) Pulse Rate: 62 (03/09 0719)  Labs: Recent Labs    09/02/23 1658 09/03/23 0407 09/03/23 1111  HGB 10.0* 9.1*  --   HCT 30.9* 27.5*  --   PLT 299 249  --   APTT  --   --  51*  HEPARINUNFRC  --   --  1.02*  CREATININE 1.87* 1.72*  --     Estimated Creatinine Clearance: 28.3 mL/min (A) (by C-G formula based on SCr of 1.72 mg/dL (H)).   Medical History: Past Medical History:  Diagnosis Date   Atrial fibrillation (HCC)    Bradycardia    Cardiomyopathy 06/28/2003   a. Possibly alcoholic; b. cath in 2005-50% ostial D1, PCW of 12, EF of 35-40%;  c. EF of 0.25 in 11/2003;  d. 40-45% in 05/2004;  e. 25% in 10/2008 by echo;  f. 04/2015 Echo: EF 40-45%, diff HK, sev inflat/inf HK, Gr 1 DD, mild AI, triv TR.   Carotid artery disease (HCC)    CKD (chronic kidney disease) stage 3, GFR 30-59 ml/min (HCC)    Hyperlipidemia    Left bundle branch block    Prostate carcinoma [facility])    Syncope    Boston CRT-P 07/03/15 Dr. Ladona Ridgel   Assessment: Max Cole is a 89 y.o. year old male admitted on 09/02/2023 with concern for afib. On eliquis for anticoagulation prior to admission (last dose 3/8 0800). Holding DOAC therapy with concern for pacemaker pocket infection with plans for possible device removal. Pharmacy consulted to dose heparin.  aPTT subtherapeutic (51), HL elevated (1.02) on 1150 units/hr. No issues with heparin infusion or signs of bleeding overnight per RN. CBC 9-10s, PLTc wnl  Goal of Therapy:  Heparin level 0.3-0.7 units/ml aPTT 66-102 seconds Monitor platelets by anticoagulation protocol: Yes   Plan:   Increase Heparin infusion to 1350 units/hr (no bolus) F/u heparin level/aPTT in 8 hours and daily while correlating  Monitor daily heparin level and CBC Continue to monitor H&H   F/u plans for anticoagulation   Thank you for allowing pharmacy to participate in this patient's care.  Verdene Rio, PharmD PGY1 Pharmacy Resident

## 2023-09-04 ENCOUNTER — Ambulatory Visit: Payer: Medicare HMO

## 2023-09-04 LAB — APTT: aPTT: 118 s — ABNORMAL HIGH (ref 24–36)

## 2023-09-04 LAB — CBC WITH DIFFERENTIAL/PLATELET
Abs Immature Granulocytes: 0.03 10*3/uL (ref 0.00–0.07)
Basophils Absolute: 0.1 10*3/uL (ref 0.0–0.1)
Basophils Relative: 1 %
Eosinophils Absolute: 0.3 10*3/uL (ref 0.0–0.5)
Eosinophils Relative: 4 %
HCT: 26.5 % — ABNORMAL LOW (ref 39.0–52.0)
Hemoglobin: 8.8 g/dL — ABNORMAL LOW (ref 13.0–17.0)
Immature Granulocytes: 0 %
Lymphocytes Relative: 30 %
Lymphs Abs: 2.2 10*3/uL (ref 0.7–4.0)
MCH: 34.8 pg — ABNORMAL HIGH (ref 26.0–34.0)
MCHC: 33.2 g/dL (ref 30.0–36.0)
MCV: 104.7 fL — ABNORMAL HIGH (ref 80.0–100.0)
Monocytes Absolute: 1.1 10*3/uL — ABNORMAL HIGH (ref 0.1–1.0)
Monocytes Relative: 15 %
Neutro Abs: 3.6 10*3/uL (ref 1.7–7.7)
Neutrophils Relative %: 50 %
Platelets: 250 10*3/uL (ref 150–400)
RBC: 2.53 MIL/uL — ABNORMAL LOW (ref 4.22–5.81)
RDW: 11.7 % (ref 11.5–15.5)
WBC: 7.2 10*3/uL (ref 4.0–10.5)
nRBC: 0 % (ref 0.0–0.2)

## 2023-09-04 LAB — COMPREHENSIVE METABOLIC PANEL
ALT: 10 U/L (ref 0–44)
AST: 16 U/L (ref 15–41)
Albumin: 3 g/dL — ABNORMAL LOW (ref 3.5–5.0)
Alkaline Phosphatase: 42 U/L (ref 38–126)
Anion gap: 8 (ref 5–15)
BUN: 34 mg/dL — ABNORMAL HIGH (ref 8–23)
CO2: 29 mmol/L (ref 22–32)
Calcium: 8.6 mg/dL — ABNORMAL LOW (ref 8.9–10.3)
Chloride: 102 mmol/L (ref 98–111)
Creatinine, Ser: 1.93 mg/dL — ABNORMAL HIGH (ref 0.61–1.24)
GFR, Estimated: 32 mL/min — ABNORMAL LOW (ref >60)
Glucose, Bld: 90 mg/dL (ref 70–99)
Potassium: 4.4 mmol/L (ref 3.5–5.1)
Sodium: 139 mmol/L (ref 135–145)
Total Bilirubin: 0.3 mg/dL (ref 0.0–1.2)
Total Protein: 5.8 g/dL — ABNORMAL LOW (ref 6.5–8.1)

## 2023-09-04 LAB — BLOOD CULTURE ID PANEL (REFLEXED) - BCID2

## 2023-09-04 LAB — HEPARIN LEVEL (UNFRACTIONATED): Heparin Unfractionated: >1.1 [IU]/mL — ABNORMAL HIGH (ref 0.30–0.70)

## 2023-09-04 LAB — MAGNESIUM: Magnesium: 2.1 mg/dL (ref 1.7–2.4)

## 2023-09-04 MED ORDER — VANCOMYCIN HCL 1.25 G IV SOLR
1250.0000 mg | INTRAVENOUS | Status: DC
  Filled 2023-09-04: qty 25

## 2023-09-04 NOTE — Progress Notes (Cosign Needed)
 Patient Name: Max Cole Date of Encounter: 09/04/2023  Primary Cardiologist: Lewayne Bunting, MD Electrophysiologist: Lewayne Bunting, MD  Interval Summary   NAEON. Patient has not had anything to eat or drink since midnight, says he was told to avoid, just in case.  PPM site is not painful. He does have ongoing R hip pain that has been bothering him for about 9 months.   He denies chest pain, chest pressure, SOB.    Vital Signs    Vitals:   09/04/23 0058 09/04/23 0300 09/04/23 0401 09/04/23 0545  BP: (!) 108/47  139/69   Pulse:   (!) 59   Resp: 18 17 19 15   Temp: 98.8 F (37.1 C)  98.7 F (37.1 C)   TempSrc: Oral  Oral   SpO2: 100%  100%   Weight:   74.2 kg   Height:        Intake/Output Summary (Last 24 hours) at 09/04/2023 0802 Last data filed at 09/04/2023 0407 Gross per 24 hour  Intake 1054.48 ml  Output 2750 ml  Net -1695.52 ml   Filed Weights   09/02/23 1627 09/03/23 0436 09/04/23 0401  Weight: 76.9 kg 74.5 kg 74.2 kg    Physical Exam    GEN- The patient is well appearing, alert and oriented x 3 today.   Lungs- Clear to ausculation bilaterally, normal work of breathing Cardiac- Regular rate and rhythm, no murmurs, rubs or gallops GI- soft, NT, ND, + BS Extremities- no clubbing or cyanosis. No edema L chest - no fluctuance, two scabbed areas on each end of PPM incision about 1cm. No drainage currently   Telemetry    AP, VP (personally reviewed)  Hospital Course    Max Cole is a 89 y.o. male with PMH of AFib, CM with HFimpEF, Ckd, CAD, HLD, LBBB, syncope s/p CRT-P, chronic resp failure on home oxygen, who is admitted for treatment of an infected incision line with cellulitis.   Assessment & Plan    #) device-related infection #) Bos sci CRT-P Recent gen chang 07/2023 with antibiotic pouch Leads implanted 2017 L chest incision appears to have bilateral stitch abscess Will tentatively plan for device extraction 3/12, pending CTS  availability Wound cultures pos for staph aureus  Blood cx NGTD at 24h Continue vanc, zosyn  #) HFimpEF Appears warm and euvolemic on exam Updated TTE with LVEF 60-65%  #) parox AFib Home dose of eliquis 2.5mg  BID, holding for procedure Cont IV hep Appreciate pharmacy's assistance with hep dosing  #) AKI Cr continuing to rise, 1.93 today Encouraged PO hydration  For questions or updates, please contact [facility] Please consult [email or web address] for contact info under Cardiology/STEMI.  Signed, Sherie Don, NP  09/04/2023, 8:02 AM   EP Attending  Patient seen and examined. Agree with above. He feels well. At the bedside today I bluntly removed the scab over the lateral aspect of the incision and probed and bluntly dissected down to good tissue. The medial aspect was probed and sanguinous material was removed with perhaps a small amount of purulence. An area in the middle was also probed and a stitch abscess was demonstrated and the stitch material also removed. A bandage was placed. Note Staph aureus in the wound material. We will continue IV anti-biotics with plans to switch to oral. If wound looks good, then we will undergo watchful waiting and if there is evidence that the pocket is involved would plan to proceed with system extraction. He is not ill. On exam  a RRR and lungs are clear. Pocket as noted above. Tele demonstrates NSR with biv pacing.  A/P Device infection possibly involving the PM pocket - he will continue anti-biotics. The central question is whether or not the pocket itself is involved. Unclear at this point. I will re-examine the pocket in the morning and if there appears to be pocket involvement as manifested by visualization of the device or drainage from deep in the pocket, then we will plan to removed the system. If no evidence of deep involvement then we will treat with long term oral anti-biotics covering the staph.   Sharlot Gowda Khoury Siemon,MD

## 2023-09-04 NOTE — TOC Initial Note (Addendum)
 Transition of Care [facility]) - Initial/Assessment Note    Patient Details  Name: Max Cole MRN: [id number] Date of Birth: 1927/12/02  Transition of Care [facility]) CM/SW Contact:    Leone Haven, RN Phone Number: 09/04/2023, 11:52 AM  Clinical Narrative:                 From home with spouse, has PCP and insurance on file, states has an aide that comes by from 11 am to 3 pm on MWF (makes lunch, does laundry and make bed) has  home oxygen with adapt 2 liters, shower chair and a walker at home.  States daughter will transport them home at [employer] and daughter is support system and POA, states gets medications from [place].  Pta self ambulatory. Daughter or son takes him to [facility] apt[facility].  Expected Discharge Plan: Home w Home Health Services Barriers to Discharge: Continued Medical Work up   Patient Goals and CMS Choice Patient states their goals for this hospitalization and ongoing recovery are:: return home   Choice offered to / list presented to : NA      Expected Discharge Plan and Services In-house Referral: NA Discharge Planning Services: CM Consult Post Acute Care Choice: NA Living arrangements for the past 2 months: Single Family Home                 DME Arranged: N/A DME Agency: NA       HH Arranged: NA          Prior Living Arrangements/Services Living arrangements for the past 2 months: Single Family Home Lives with:: Spouse Patient language and need for interpreter reviewed:: Yes Do you feel safe going back to the place where you live?: Yes      Need for Family Participation in Patient Care: Yes (Comment) Care giver support system in place?: Yes (comment) Current home services: DME (shower chair, home oxygen with adapt 2 liters, walker,) Criminal Activity/Legal Involvement Pertinent to Current Situation/Hospitalization: No - Comment as needed  Activities of Daily Living   ADL Screening (condition at time of admission) Independently performs  ADLs?: Yes (appropriate for developmental age) Is the patient deaf or have difficulty hearing?: No Does the patient have difficulty seeing, even when wearing glasses/contacts?: No Does the patient have difficulty concentrating, remembering, or making decisions?: No  Permission Sought/Granted Permission sought to share information with : Case Manager Permission granted to share information with : Yes, Verbal Permission Granted  Share Information with NAME: Dillard Cannon     Permission granted to share info w Relationship: daughter (POA)     Emotional Assessment Appearance:: Appears stated age Attitude/Demeanor/Rapport: Engaged Affect (typically observed): Appropriate Orientation: : Oriented to Self, Oriented to Place, Oriented to  Time, Oriented to Situation Alcohol / Substance Use: Not Applicable Psych Involvement: No (comment)  Admission diagnosis:  Infected pacemaker, initial encounter (HCC) [H08.7XXA] Patient Active Problem List   Diagnosis Date Noted   Infected pacemaker, initial encounter (HCC) 09/02/2023   Chronic respiratory failure with hypoxia (HCC) 10/20/2021   Anemia 12/03/2020   Personal history of noncompliance with medical treatment, presenting hazards to health 12/03/2020   Aortic atherosclerosis (HCC) 06/04/2020   BPH without urinary obstruction 06/04/2020   Anemia due to stage 3 chronic kidney disease (HCC) 06/04/2020   History of prostate cancer 06/04/2020   Chronic constipation 06/04/2020   Lobar pneumonia (HCC) 05/23/2020   Respiratory failure with hypoxia (HCC) 05/22/2020   Chronic a-fib (HCC) 05/22/2020   Systolic and diastolic  CHF, chronic (HCC) 02/06/2020   Pacemaker 07/03/2015   Syncope 05/20/2015   Nonischemic cardiomyopathy (HCC) 05/20/2015   Carotid stenosis, bilateral 11/19/2013   Hyperlipidemia 10/15/2011   Cerebrovascular disease    Left bundle branch block    Cardiomyopathy, nonischemic (HCC)    CKD (chronic kidney disease) stage 3, GFR  30-59 ml/min (HCC)    PCP:  Carylon Perches, MD Pharmacy:   [facility] - [place], [place] - [address] [place] [address] Phone: [phone] Fax: [phone]  [facility] [address] [place] [address] Phone: [phone] Fax: [phone]     Social Drivers of Health (SDOH) Social History: SDOH Screenings   Food Insecurity: No Food Insecurity (09/02/2023)  Housing: Low Risk  (09/02/2023)  Transportation Needs: No Transportation Needs (09/02/2023)  Utilities: Not At Risk (09/02/2023)  Financial Resource Strain: Low Risk  (10/12/2022)  Physical Activity: Insufficiently Active (10/14/2022)  Social Connections: Unknown (09/02/2023)  Tobacco Use: Medium Risk (09/02/2023)   SDOH Interventions:     Readmission Risk Interventions    09/04/2023   11:38 AM  Readmission Risk Prevention Plan  Transportation Screening Complete  PCP or Specialist Appt within 5-7 Days Complete  Home Care Screening Complete  Medication Review (RN CM) Complete

## 2023-09-04 NOTE — Progress Notes (Signed)
 PHARMACY - PHYSICIAN COMMUNICATION CRITICAL VALUE ALERT - BLOOD CULTURE IDENTIFICATION (BCID)  Max Cole is an 89 y.o. male who presented to [facility] on 09/02/2023 with a chief complaint of infected pacemaker pocket.   Assessment: 89 year old male admitted with infected pacemaker pocket. Planning extraction 3/12>> Blood cultures with 1/6 bottles staph species - likely a contaminant. 3/8 wound culture with MSSA.     Name of physician (or Provider) Contacted: Sherie Don, NP  Current antibiotics: Vanc/zosyn   Changes to prescribed antibiotics recommended:  Blood culture likely contaminant.  Recommend d/c vanc based on MSSA on wound culture  F/u to see if GNRs on gram stain grow   Results for orders placed or performed during the hospital encounter of 09/02/23  Blood Culture ID Panel (Reflexed) (Collected: 09/02/2023  5:56 PM)  Result Value Ref Range   Enterococcus faecalis NOT DETECTED NOT DETECTED   Enterococcus Faecium NOT DETECTED NOT DETECTED   Listeria monocytogenes NOT DETECTED NOT DETECTED   Staphylococcus species DETECTED (A) NOT DETECTED   Staphylococcus aureus (BCID) NOT DETECTED NOT DETECTED   Staphylococcus epidermidis NOT DETECTED NOT DETECTED   Staphylococcus lugdunensis NOT DETECTED NOT DETECTED   Streptococcus species NOT DETECTED NOT DETECTED   Streptococcus agalactiae NOT DETECTED NOT DETECTED   Streptococcus pneumoniae NOT DETECTED NOT DETECTED   Streptococcus pyogenes NOT DETECTED NOT DETECTED   A.calcoaceticus-baumannii NOT DETECTED NOT DETECTED   Bacteroides fragilis NOT DETECTED NOT DETECTED   Enterobacterales NOT DETECTED NOT DETECTED   Enterobacter cloacae complex NOT DETECTED NOT DETECTED   Escherichia coli NOT DETECTED NOT DETECTED   Klebsiella aerogenes NOT DETECTED NOT DETECTED   Klebsiella oxytoca NOT DETECTED NOT DETECTED   Klebsiella pneumoniae NOT DETECTED NOT DETECTED   Proteus species NOT DETECTED NOT DETECTED   Salmonella species NOT  DETECTED NOT DETECTED   Serratia marcescens NOT DETECTED NOT DETECTED   Haemophilus influenzae NOT DETECTED NOT DETECTED   Neisseria meningitidis NOT DETECTED NOT DETECTED   Pseudomonas aeruginosa NOT DETECTED NOT DETECTED   Stenotrophomonas maltophilia NOT DETECTED NOT DETECTED   Candida albicans NOT DETECTED NOT DETECTED   Candida auris NOT DETECTED NOT DETECTED   Candida glabrata NOT DETECTED NOT DETECTED   Candida krusei NOT DETECTED NOT DETECTED   Candida parapsilosis NOT DETECTED NOT DETECTED   Candida tropicalis NOT DETECTED NOT DETECTED   Cryptococcus neoformans/gattii NOT DETECTED NOT DETECTED   Sharin Mons, PharmD, BCPS, BCIDP Infectious Diseases Clinical Pharmacist Phone: [phone] 09/04/2023  2:36 PM

## 2023-09-04 NOTE — Progress Notes (Signed)
 PHARMACY - ANTICOAGULATION Pharmacy Consult for heparin  Indication: atrial fibrillation Brief A/P: aPTT supratherapeutic Decrease Heparin rate  No Known Allergies  Patient Measurements: Height: 6' (182.9 cm) Weight: 74.2 kg (163 lb 8 oz) IBW/kg (Calculated) : 77.6 Heparin Dosing Weight: 76.9 kg   Vital Signs: Temp: 98.7 F (37.1 C) (03/10 0401) Temp Source: Oral (03/10 0401) BP: 139/69 (03/10 0401) Pulse Rate: 59 (03/10 0401)  Labs: Recent Labs    09/02/23 1658 09/03/23 0407 09/03/23 1111 09/03/23 2118 09/04/23 0301  HGB 10.0* 9.1*  --   --  8.8*  HCT 30.9* 27.5*  --   --  26.5*  PLT 299 249  --   --  250  APTT  --   --  51* 95* 118*  HEPARINUNFRC  --   --  1.02*  --  >1.10*  CREATININE 1.87* 1.72*  --   --  1.93*    Estimated Creatinine Clearance: 25.1 mL/min (A) (by C-G formula based on SCr of 1.93 mg/dL (H)).   Assessment: 89 y.o. male with h/o Afib, Eliquis on hold, for heparin  Goal of Therapy:  Heparin level 0.3-0.7 units/ml aPTT 66-102 seconds Monitor platelets by anticoagulation protocol: Yes   Plan:  Decrease Heparin 1200 units/hr  Geannie Risen, PharmD, BCPS

## 2023-09-04 NOTE — Plan of Care (Signed)
  Problem: Education: Goal: Knowledge of General Education information will improve Description: Including pain rating scale, medication(s)/side effects and non-pharmacologic comfort measures Outcome: Progressing   Problem: Elimination: Goal: Will not experience complications related to urinary retention Outcome: Progressing   Problem: Clinical Measurements: Goal: Will remain free from infection Outcome: Not Progressing Goal: Cardiovascular complication will be avoided Outcome: Not Progressing   Problem: Pain Managment: Goal: General experience of comfort will improve and/or be controlled Outcome: Not Progressing

## 2023-09-05 LAB — MAGNESIUM: Magnesium: 2.3 mg/dL (ref 1.7–2.4)

## 2023-09-05 LAB — CBC WITH DIFFERENTIAL/PLATELET
Abs Immature Granulocytes: 0.04 10*3/uL (ref 0.00–0.07)
Basophils Absolute: 0.1 10*3/uL (ref 0.0–0.1)
Basophils Relative: 1 %
Eosinophils Absolute: 0.3 10*3/uL (ref 0.0–0.5)
Eosinophils Relative: 3 %
HCT: 28.4 % — ABNORMAL LOW (ref 39.0–52.0)
Hemoglobin: 9.2 g/dL — ABNORMAL LOW (ref 13.0–17.0)
Immature Granulocytes: 1 %
Lymphocytes Relative: 26 %
Lymphs Abs: 2.1 10*3/uL (ref 0.7–4.0)
MCH: 33.9 pg (ref 26.0–34.0)
MCHC: 32.4 g/dL (ref 30.0–36.0)
MCV: 104.8 fL — ABNORMAL HIGH (ref 80.0–100.0)
Monocytes Absolute: 1.1 10*3/uL — ABNORMAL HIGH (ref 0.1–1.0)
Monocytes Relative: 14 %
Neutro Abs: 4.6 10*3/uL (ref 1.7–7.7)
Neutrophils Relative %: 55 %
Platelets: 270 10*3/uL (ref 150–400)
RBC: 2.71 MIL/uL — ABNORMAL LOW (ref 4.22–5.81)
RDW: 11.7 % (ref 11.5–15.5)
WBC: 8.2 10*3/uL (ref 4.0–10.5)
nRBC: 0 % (ref 0.0–0.2)

## 2023-09-05 LAB — COMPREHENSIVE METABOLIC PANEL
ALT: 11 U/L (ref 0–44)
AST: 17 U/L (ref 15–41)
Albumin: 3.1 g/dL — ABNORMAL LOW (ref 3.5–5.0)
Alkaline Phosphatase: 45 U/L (ref 38–126)
Anion gap: 10 (ref 5–15)
BUN: 32 mg/dL — ABNORMAL HIGH (ref 8–23)
CO2: 29 mmol/L (ref 22–32)
Calcium: 8.8 mg/dL — ABNORMAL LOW (ref 8.9–10.3)
Chloride: 99 mmol/L (ref 98–111)
Creatinine, Ser: 1.91 mg/dL — ABNORMAL HIGH (ref 0.61–1.24)
GFR, Estimated: 32 mL/min — ABNORMAL LOW (ref >60)
Glucose, Bld: 95 mg/dL (ref 70–99)
Potassium: 4.3 mmol/L (ref 3.5–5.1)
Sodium: 138 mmol/L (ref 135–145)
Total Bilirubin: 0.4 mg/dL (ref 0.0–1.2)
Total Protein: 5.9 g/dL — ABNORMAL LOW (ref 6.5–8.1)

## 2023-09-05 LAB — SURGICAL PCR SCREEN
MRSA, PCR: NEGATIVE
Staphylococcus aureus: POSITIVE — AB

## 2023-09-05 LAB — ABO/RH: ABO/RH(D): A POS

## 2023-09-05 LAB — APTT: aPTT: 81 s — ABNORMAL HIGH (ref 24–36)

## 2023-09-05 LAB — HEPARIN LEVEL (UNFRACTIONATED): Heparin Unfractionated: 0.94 [IU]/mL — ABNORMAL HIGH (ref 0.30–0.70)

## 2023-09-05 NOTE — Progress Notes (Signed)
 Patient Name: Max Cole Date of Encounter: 09/05/2023  Primary Cardiologist: Lewayne Bunting, MD Electrophysiologist: Lewayne Bunting, MD  Interval Summary   The patient is doing well today. Reports no significant pain at [facility] site.   At this time, the patient denies chest pain, shortness of breath, or any new concerns.  Vital Signs    Vitals:   09/05/23 0500 09/05/23 0559 09/05/23 0606 09/05/23 0632  BP:  124/66    Pulse:  63    Resp: 15 15 15    Temp:  97.6 F (36.4 C)    TempSrc:  Oral    SpO2:  100%    Weight:    73 kg  Height:        Intake/Output Summary (Last 24 hours) at 09/05/2023 0749 Last data filed at 09/05/2023 0630 Gross per 24 hour  Intake 614 ml  Output 1225 ml  Net -611 ml   Filed Weights   09/03/23 0436 09/04/23 0401 09/05/23 3086  Weight: 74.5 kg 74.2 kg 73 kg    Physical Exam    GEN- elderly male , alert and oriented x 3 today.   Lungs- Clear to ausculation bilaterally, normal work of breathing Cardiac- Regular rate and rhythm, no murmurs, rubs or gallops. Left chest incision site with open area on each end of incision, small amt of bloody purulent drainage, middle of incision with open area with purulent drainage  GI- soft, NT, ND, + BS Extremities- no clubbing or cyanosis. No edema  Telemetry    APVP / VP 60's (personally reviewed)  Hospital Course    LENOARD HELBERT is a 89 y.o. male with PMH of AFib, CM with HFimpEF, Ckd, CAD, HLD, LBBB, syncope s/p CRT-P, chronic resp failure on home oxygen, who is admitted for concerns of pocket stitch abscess vs CIED infection.   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 Wound culture positive for staph aureus (resistant to erythro, clinda) -follow up blood cultures, note GPC's in clusters in aerobic bottle  -plan to reassess wound in am with Dr. 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  Signed, Canary Brim, NP-C, AGACNP-BC [facility] - Electrophysiology  09/05/2023, 8:20 AM  EP Attending  Patient seen and examined. Agree with the findings as noted above. The patient is doing well. He still has hip pain which is chronic. He is pending hip replacement next month. I debrided his incision yesterday and no evidence of residual infection today. No fever or chills and WBC has been normal. On exam he is well appearing and in NAD. Lungs are clear and CV with a RRR. Ext without edema. Incision with 3 areas with good granulation tissue and no purulence. Tele with NSR with ventricular pacing.  A/P Stitch abscess with mild cellulitis - he has improved with anti-biotic, incision debridement and we will hold off on extraction at this point. There is still a chance that the pocket  is contaminated but hopefully not so. I explained to his daughter about signs that he still has an infection. We will give him 10 days of doxycycline. I'll see him back in 13 days in my [place] office.  Chronic systolic heart failure - he appears euvolemic and will continue his current meds.  Sharlot Gowda Taylor,MD

## 2023-09-05 NOTE — Progress Notes (Signed)
 Mobility Specialist Progress Note:    09/05/23 1144  Mobility  Activity Ambulated with assistance in hallway  Level of Assistance Contact guard assist, steadying assist  Assistive Device Front wheel walker  Distance Ambulated (ft) 230 ft  Activity Response Tolerated well  Mobility Referral Yes  Mobility visit 1 Mobility  Mobility Specialist Start Time (ACUTE ONLY) 1100  Mobility Specialist Stop Time (ACUTE ONLY) 1118  Mobility Specialist Time Calculation (min) (ACUTE ONLY) 18 min   Pt received in bed eager for mobility. No physical assistance required ambulated on 3L/min VSS. No c/o throughout. Returned to room w/o fault. Left in bed w/ call bell and personal belongings in reach. Left on 2L/min. Family in room. Pt requested MS to come back this afternoon will f/u as able.  Thompson Grayer Mobility Specialist  Please contact vis Secure Chat or  Rehab Office [phone]

## 2023-09-05 NOTE — Progress Notes (Addendum)
 PHARMACY - ANTICOAGULATION CONSULT NOTE  Pharmacy Consult for IV heparin  Indication: atrial fibrillation  No Known Allergies  Patient Measurements: Height: 6' (182.9 cm) Weight: 73 kg (160 lb 14.4 oz) IBW/kg (Calculated) : 77.6 Heparin Dosing Weight: 76.9 kg   Vital Signs: Temp: 97.6 F (36.4 C) (03/11 0559) Temp Source: Oral (03/11 0559) BP: 124/66 (03/11 0559) Pulse Rate: 63 (03/11 0559)  Labs: Recent Labs    09/03/23 0407 09/03/23 1111 09/03/23 1111 09/03/23 2118 09/04/23 0301 09/05/23 0255  HGB 9.1*  --   --   --  8.8* 9.2*  HCT 27.5*  --   --   --  26.5* 28.4*  PLT 249  --   --   --  250 270  APTT  --  51*   < > 95* 118* 81*  HEPARINUNFRC  --  1.02*  --   --  >1.10* 0.94*  CREATININE 1.72*  --   --   --  1.93* 1.91*   < > = values in this interval not displayed.    Estimated Creatinine Clearance: 24.9 mL/min (A) (by C-G formula based on SCr of 1.91 mg/dL (H)).   Medical History: Past Medical History:  Diagnosis Date   Atrial fibrillation (HCC)    Bradycardia    Cardiomyopathy 06/28/2003   a. Possibly alcoholic; b. cath in 2005-50% ostial D1, PCW of 12, EF of 35-40%;  c. EF of 0.25 in 11/2003;  d. 40-45% in 05/2004;  e. 25% in 10/2008 by echo;  f. 04/2015 Echo: EF 40-45%, diff HK, sev inflat/inf HK, Gr 1 DD, mild AI, triv TR.   Carotid artery disease (HCC)    CKD (chronic kidney disease) stage 3, GFR 30-59 ml/min (HCC)    Hyperlipidemia    Left bundle branch block    Prostate carcinoma [facility])    Syncope    [place] CRT-P 07/03/15 Dr. Ladona Ridgel   Assessment: Max Cole is a 89 y.o. year old male admitted on 09/02/2023 with concern for afib. On eliquis for anticoagulation prior to admission (last dose 3/8 0800). Holding DOAC therapy with concern for pacemaker pocket infection with plans for possible device removal. Pharmacy consulted to dose heparin.  aPTT is therapeutic at 81 seconds, heparin level falsely elevated by DOAC use, CBC stable. Planning device  removal tomorrow.  Goal of Therapy:  Heparin level 0.3-0.7 units/ml aPTT 66-102 seconds Monitor platelets by anticoagulation protocol: Yes   Plan:  Continue heparin 1200 units/h Daily aPTT, heparin level, CBC  ADDENDUM 1420: Stop heparin at midnight tonight in anticipation of lead removal 3/12.  Fredonia Highland, PharmD, BCPS, [facility] Clinical Pharmacist [phone] Please check AMION for all [facility] Pharmacy numbers 09/05/2023

## 2023-09-05 NOTE — Plan of Care (Signed)
  Problem: Education: Goal: Knowledge of General Education information will improve Description: Including pain rating scale, medication(s)/side effects and non-pharmacologic comfort measures Outcome: Progressing   Problem: Clinical Measurements: Goal: Will remain free from infection Outcome: Progressing   Problem: Elimination: Goal: Will not experience complications related to bowel motility Outcome: Progressing Goal: Will not experience complications related to urinary retention Outcome: Progressing   Problem: Clinical Measurements: Goal: Respiratory complications will improve Outcome: Not Progressing Goal: Cardiovascular complication will be avoided Outcome: Not Progressing   Problem: Pain Managment: Goal: General experience of comfort will improve and/or be controlled Outcome: Not Progressing

## 2023-09-06 ENCOUNTER — Other Ambulatory Visit (HOSPITAL_COMMUNITY): Payer: Self-pay

## 2023-09-06 ENCOUNTER — Encounter (HOSPITAL_COMMUNITY): Payer: Self-pay | Admitting: Anesthesiology

## 2023-09-06 ENCOUNTER — Encounter (HOSPITAL_COMMUNITY): Admission: EM | Payer: Self-pay | Source: Home / Self Care

## 2023-09-06 ENCOUNTER — Other Ambulatory Visit (HOSPITAL_COMMUNITY)

## 2023-09-06 LAB — COMPREHENSIVE METABOLIC PANEL
ALT: 12 U/L (ref 0–44)
AST: 17 U/L (ref 15–41)
Albumin: 3.3 g/dL — ABNORMAL LOW (ref 3.5–5.0)
Alkaline Phosphatase: 44 U/L (ref 38–126)
Anion gap: 8 (ref 5–15)
BUN: 35 mg/dL — ABNORMAL HIGH (ref 8–23)
CO2: 29 mmol/L (ref 22–32)
Calcium: 9.2 mg/dL (ref 8.9–10.3)
Chloride: 100 mmol/L (ref 98–111)
Creatinine, Ser: 2.31 mg/dL — ABNORMAL HIGH (ref 0.61–1.24)
GFR, Estimated: 26 mL/min — ABNORMAL LOW (ref >60)
Glucose, Bld: 93 mg/dL (ref 70–99)
Potassium: 4 mmol/L (ref 3.5–5.1)
Sodium: 137 mmol/L (ref 135–145)
Total Bilirubin: 0.6 mg/dL (ref 0.0–1.2)
Total Protein: 6.3 g/dL — ABNORMAL LOW (ref 6.5–8.1)

## 2023-09-06 LAB — CBC WITH DIFFERENTIAL/PLATELET
Abs Immature Granulocytes: 0.04 10*3/uL (ref 0.00–0.07)
Basophils Absolute: 0.1 10*3/uL (ref 0.0–0.1)
Basophils Relative: 1 %
Eosinophils Absolute: 0.2 10*3/uL (ref 0.0–0.5)
Eosinophils Relative: 2 %
HCT: 29.2 % — ABNORMAL LOW (ref 39.0–52.0)
Hemoglobin: 9.7 g/dL — ABNORMAL LOW (ref 13.0–17.0)
Immature Granulocytes: 1 %
Lymphocytes Relative: 26 %
Lymphs Abs: 2.1 10*3/uL (ref 0.7–4.0)
MCH: 34.5 pg — ABNORMAL HIGH (ref 26.0–34.0)
MCHC: 33.2 g/dL (ref 30.0–36.0)
MCV: 103.9 fL — ABNORMAL HIGH (ref 80.0–100.0)
Monocytes Absolute: 1.2 10*3/uL — ABNORMAL HIGH (ref 0.1–1.0)
Monocytes Relative: 15 %
Neutro Abs: 4.7 10*3/uL (ref 1.7–7.7)
Neutrophils Relative %: 55 %
Platelets: 266 10*3/uL (ref 150–400)
RBC: 2.81 MIL/uL — ABNORMAL LOW (ref 4.22–5.81)
RDW: 11.7 % (ref 11.5–15.5)
WBC: 8.3 10*3/uL (ref 4.0–10.5)
nRBC: 0 % (ref 0.0–0.2)

## 2023-09-06 LAB — PREPARE RBC (CROSSMATCH)

## 2023-09-06 LAB — CULTURE, BLOOD (ROUTINE X 2): Culture  Setup Time: NO GROWTH

## 2023-09-06 LAB — MAGNESIUM: Magnesium: 2.3 mg/dL (ref 1.7–2.4)

## 2023-09-06 SURGERY — LEAD EXTRACTION
Anesthesia: General

## 2023-09-06 MED ORDER — SODIUM CHLORIDE 0.9 % IV SOLN: INTRAVENOUS | Status: DC

## 2023-09-06 MED ORDER — SODIUM CHLORIDE 0.9 % IV SOLN
80.0000 mg | INTRAVENOUS | Status: DC
  Filled 2023-09-06: qty 2

## 2023-09-06 MED ORDER — SODIUM CHLORIDE 0.9% IV SOLUTION: Freq: Once | INTRAVENOUS | Status: DC

## 2023-09-06 MED ORDER — CEFAZOLIN SODIUM-DEXTROSE 2-4 GM/100ML-% IV SOLN: 2.0000 g | INTRAVENOUS | Status: DC

## 2023-09-06 MED ORDER — SODIUM CHLORIDE 0.9 % IV SOLN: 250.0000 mL | INTRAVENOUS | Status: DC

## 2023-09-06 MED ORDER — CHLORHEXIDINE GLUCONATE 4 % EX SOLN
60.0000 mL | Freq: Once | CUTANEOUS | Status: AC
  Administered 2023-09-06: 4 via TOPICAL
  Filled 2023-09-06: qty 30

## 2023-09-06 MED ORDER — DOXYCYCLINE HYCLATE 100 MG PO TABS
100.0000 mg | ORAL_TABLET | Freq: Two times a day (BID) | ORAL | 0 refills | Status: AC
  Filled 2023-09-06: qty 28, 14d supply, fill #0

## 2023-09-06 MED ORDER — SODIUM CHLORIDE 0.9% FLUSH: 3.0000 mL | INTRAVENOUS | Status: DC | PRN

## 2023-09-06 MED ORDER — MUPIROCIN 2 % EX OINT
TOPICAL_OINTMENT | Freq: Two times a day (BID) | CUTANEOUS | Status: DC
  Filled 2023-09-06: qty 22

## 2023-09-06 MED ORDER — SODIUM CHLORIDE 0.9% FLUSH: 3.0000 mL | Freq: Two times a day (BID) | INTRAVENOUS | Status: DC

## 2023-09-06 MED ORDER — CHLORHEXIDINE GLUCONATE 4 % EX SOLN
60.0000 mL | Freq: Once | CUTANEOUS | Status: DC
  Filled 2023-09-06: qty 30

## 2023-09-06 NOTE — Discharge Summary (Addendum)
 ELECTROPHYSIOLOGY DISCHARGE SUMMARY    Patient ID: Max Cole,  MRN: [id number], DOB/AGE: [birth year] 89 y.o.  Admit date: 09/02/2023 Discharge date: 09/06/2023  Primary Care Physician: Carylon Perches, MD  Primary Cardiologist: Lewayne Bunting, MD  Electrophysiologist: Dr. Ladona Ridgel   Primary Discharge Diagnosis:  Incision cellulitis  Secondary Discharge Diagnosis:  BiVentricular ICD Heart Failure with recovered LVEF Paroxysmal Afib AKI  Procedures This Admission:  none      Brief HPI: Max Cole is a 89 y.o. male with a history of AFib, CM with HFimpEF, CKD, CAD, HLD, LBBB, syncope s/p CRT-P, chronic resp failure on home oxygen  admitted for device pocket stitch abscess versus ICD infection.   Hospital Course:  The patient was admitted 09/02/2023 after family noticed drainage from [facility] generator site. He is s/p recent generator exchange 07/2023. Culture from L chest site grew staph aureus. Blood cultures 1/4 positive for staph capitis so thought to be contaminant. IV abx narrowed to zosyn. Initial plan was to extract his entire system. Dr. Ladona Ridgel performed bedside debridement with improvement in incisional appearance and pink tissue underneath. He is not a candidate for cardiac surgery should he bleed during extraction. Given incisional improvement with gentle debridement, the decision was made to not extract his system at this time and instead continue to monitor the site closely. This plan was discussed with the patient and his daughter, who are in agreement.   He was examined the morning of 09/06/2023 by Dr. Ladona Ridgel and considered stable for discharge. He will be seen back by Dr. Ladona Ridgel in approx 2 weeks for close follow-up.   Physical Exam: Vitals:   09/05/23 1944 09/06/23 0048 09/06/23 0459 09/06/23 0730  BP: (!) 154/72 (!) 149/76 124/66 (!) 153/65  Pulse: 75 75 65 66  Resp: 14 14 18 18   Temp: 97.6 F (36.4 C) 97.8 F (36.6 C) 97.9 F (36.6 C) (!) 97.5 F (36.4 C)   TempSrc: Oral Axillary Oral Oral  SpO2: 98% 98% 90% 95%  Weight:   71.6 kg   Height:        GEN- NAD. A&O x 3.  HEENT: Normocephalic, atraumatic Lungs- CTAB, Normal effort.  Heart- RRR, No M/G/R.  GI- Soft, NT, ND.  Extremities- No clubbing, cyanosis, or edema;   L chest - 3 small, open areas along device incision, no erythema, no purulence, no fluctuation in pocket   Discharge Medications:  Allergies as of 09/06/2023   No Known Allergies      Medication List     PAUSE taking these medications    apixaban 2.5 MG Tabs tablet Wait to take this until: September 06, 2023 Evening Commonly known as: ELIQUIS Take 1 tablet (2.5 mg total) by mouth 2 (two) times daily.       TAKE these medications    acetaminophen 500 MG tablet Commonly known as: TYLENOL Take 500 mg by mouth every 4 (four) hours as needed for mild pain (pain score 1-3).   doxycycline 100 MG tablet Commonly known as: VIBRA-TABS Take 1 tablet (100 mg total) by mouth 2 (two) times daily for 14 days.   ferrous sulfate 325 (65 FE) MG tablet Take 1 tablet (325 mg total) by mouth daily with breakfast.   furosemide 40 MG tablet Commonly known as: LASIX Take 60 mg by mouth daily.   HYDROcodone-acetaminophen 7.5-325 MG tablet Commonly known as: NORCO Take 1 tablet by mouth every 4 (four) hours as needed for severe pain (pain score 7-10).   metoprolol  tartrate 50 MG tablet Commonly known as: LOPRESSOR Take 1 tablet (50 mg total) by mouth 2 (two) times daily.   OXYGEN Inhale 2 L into the lungs continuous.   polyethylene glycol powder 17 GM/SCOOP powder Commonly known as: GLYCOLAX/MIRALAX Take 17 g by mouth daily as needed for moderate constipation.   pravastatin 20 MG tablet Commonly known as: PRAVACHOL Take 1 tablet (20 mg total) by mouth every evening.   tamsulosin 0.4 MG Caps capsule Commonly known as: FLOMAX Take 0.4 mg by mouth daily.   traZODone 50 MG tablet Commonly known as: DESYREL Take 50  mg by mouth at bedtime.        Disposition: Home with usual follow up as in AVS  Duration of Discharge Encounter:  APP time: 16 minutes  Signed, Sherie Don, NP  09/06/2023 9:14 AM  EP attending  Patient seen and examined.  Agree with the findings as noted above.  The patient is doing well this morning.  He has no additional drainage from his pacemaker incision.  His pocket does not appear to be involved with no fluctuance noted.  There is no cellulitis over his skin.  Telemetry demonstrates sinus rhythm with AV sequential pacing.  Labs were reviewed.  His white count has been less than 10,000.  He does not have a temperature.  I discussed the situation with the patient and his daughter.  He will undergo watchful waiting.  He will be treated with doxycycline as above for 10 days.  I will see him back in approximately 2 weeks.  If he has any fevers or chills he is instructed to come to the emergency room.  If he develops any drainage he is also instructed to call our office. I spent 19 minutes performing all of the above. Lewayne Bunting, MD

## 2023-09-06 NOTE — Progress Notes (Addendum)
 Patient Name: Max Cole Date of Encounter: 09/06/2023  Primary Cardiologist: Lewayne Bunting, MD Electrophysiologist: Lewayne Bunting, MD  Interval Summary   The patient is doing well today.  At this time, the patient denies chest pain, shortness of breath, or any new concerns. He daughter is at bedside.   Stop iv abx Start oral abx x 10days  Appears to be stitch abscess No CTS backup, so if bleeds during extraction he will die After removal of residual stitch, wound appears to be healing  Given age and debility, would recommend watchful waiting at this time.  Wash daily with gentle soap and washcloth, gentle pressure  Clean sheets on bed, clean towels.  Red flag warnings reviewed with daughter - high fevers, MS change  Start eliquis tonight  Vital Signs    Vitals:   09/05/23 1944 09/06/23 0048 09/06/23 0459 09/06/23 0730  BP: (!) 154/72 (!) 149/76 124/66 (!) 153/65  Pulse: 75 75 65 66  Resp: 14 14 18 18   Temp: 97.6 F (36.4 C) 97.8 F (36.6 C) 97.9 F (36.6 C) (!) 97.5 F (36.4 C)  TempSrc: Oral Axillary Oral Oral  SpO2: 98% 98% 90% 95%  Weight:   71.6 kg   Height:        Intake/Output Summary (Last 24 hours) at 09/06/2023 0818 Last data filed at 09/05/2023 2300 Gross per 24 hour  Intake 360 ml  Output 800 ml  Net -440 ml   Filed Weights   09/04/23 0401 09/05/23 0632 09/06/23 0459  Weight: 74.2 kg 73 kg 71.6 kg    Physical Exam    GEN- The patient is well appearing, alert and oriented x 3 today.   Lungs- Clear to ausculation bilaterally, normal work of breathing Cardiac- Regular rate and rhythm, no murmurs, rubs or gallops GI- soft, NT, ND, + BS Extremities- no clubbing or cyanosis. No edema  L chest - 3 small, open areas along device incision, no errythema, no purulence, no fluctuation in pocket  Telemetry    AP VP (personally reviewed)  Hospital Course    RACIEL CAFFREY is a 89 y.o. 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 Wound culture positive for staph aureus (resistant to erythro, clinda) Blood cultures 1/4 positive, likely contaminant Device incision appears improved after yesterday's bedside debridement Long discussion with patient and daughter regarding risks/benefits of device extraction. Not surgical candidate, so no CTS backup for extraction if bleeding develops during procedure At this time, will cancel device extraction and continue close monitoring DC IV zosyn after this AM dose, and then discharge on PO 100mg  doxy BID Wound care directions reviewed with patient and daughter - will place in AVS. Red flag symptoms reviewed Close follow-up scheduled with Dr. Ladona Ridgel in clinic.     #) HFrecEF LVEF 60-65% -euvolemic on exam    Paroxysmal AF  -off heparin in anticipation of device extraction, will remain off Resume eliquis with PM dose tonight    AKI  -Trend BMP / urinary output -Replace electrolytes as indicated -Avoid nephrotoxic agents, ensure adequate renal perfusion     For questions or updates, please contact [facility] Please consult [email or web address] for  contact info under Cardiology/STEMI.  Signed, Sherie Don, NP  09/06/2023, 8:18 AM   EP Attending  Patient seen and examined. Agree with above. He is doing well today. He has no fever and no drainage and the 1/4 culture is a different organism and is thought to represent contamination. He will be discharged home with usual followup. He will be maintained on doxycycline and his usual GDMT. I encouraged him to wash his skin gently at least once a day. Followup with me in 2 weeks.  Sharlot Gowda Emiley Digiacomo,MD

## 2023-09-06 NOTE — Plan of Care (Signed)
  Problem: Nutrition: Goal: Adequate nutrition will be maintained Outcome: Completed/Met   Problem: Coping: Goal: Level of anxiety will decrease Outcome: Completed/Met   Problem: Elimination: Goal: Will not experience complications related to bowel motility Outcome: Completed/Met Goal: Will not experience complications related to urinary retention Outcome: Completed/Met   Problem: Pain Managment: Goal: General experience of comfort will improve and/or be controlled Outcome: Completed/Met   Problem: Safety: Goal: Ability to remain free from injury will improve Outcome: Completed/Met

## 2023-09-06 NOTE — Plan of Care (Signed)
 Pt vital signs and assessment unremarkable. Pt under observation for PM infection vs lead replacement vs generator replacement

## 2023-09-06 NOTE — Discharge Instructions (Addendum)
 Wound care instructions -  Wash incision daily with antibacterial soap and clean washcloth, applying gentle pressure. Dry with clean towel. Sleep on clean sheets.  Inspect incision daily - taking note of any increased redness, drainage, or visible device parts  DO NOT apply creams, salves, lotion, ointments, or bandages to the incision. OK to wear a clean shirt.    If mental status changes develop, high fevers, or uncontrolled pain please go to [facility] ER immediately.   Resume eliquis 3/12 with PM dose, then resume morning and evening doses as usual.

## 2023-09-06 NOTE — TOC Transition Note (Signed)
 Transition of Care [facility]) - Discharge Note   Patient Details  Name: Max Cole MRN: [id number] Date of Birth: [birth year]  Transition of Care [facility]) CM/SW Contact:  Leone Haven, RN Phone Number: 09/06/2023, 1:46 PM   Clinical Narrative:    For dc today after he receives the iv abx, daughter is at the bedside to transport him home, she has the oxygen at the bedside also for him to go home with.     Barriers to Discharge: Continued Medical Work up   Patient Goals and CMS Choice Patient states their goals for this hospitalization and ongoing recovery are:: return home   Choice offered to / list presented to : NA      Discharge Placement                       Discharge Plan and Services Additional resources added to the After Visit Summary for   In-house Referral: NA Discharge Planning Services: CM Consult Post Acute Care Choice: NA          DME Arranged: N/A DME Agency: NA       HH Arranged: NA          Social Drivers of Health (SDOH) Interventions SDOH Screenings   Food Insecurity: No Food Insecurity (09/02/2023)  Housing: Low Risk  (09/02/2023)  Transportation Needs: No Transportation Needs (09/02/2023)  Utilities: Not At Risk (09/02/2023)  Financial Resource Strain: Low Risk  (10/12/2022)  Physical Activity: Insufficiently Active (10/14/2022)  Social Connections: Unknown (09/02/2023)  Tobacco Use: Medium Risk (09/02/2023)     Readmission Risk Interventions    09/04/2023   11:38 AM  Readmission Risk Prevention Plan  Transportation Screening Complete  PCP or Specialist Appt within 5-7 Days Complete  Home Care Screening Complete  Medication Review (RN CM) Complete

## 2023-09-06 NOTE — Progress Notes (Signed)
 Pt has orders to be discharged. Discharge instructions given and pt has no additional questions at this time. Medication regimen reviewed and pt educated. Pt and family verbalized understanding and has no additional questions. Pt is still receiving IV antibiotics, so unable to send out yet.

## 2023-09-07 ENCOUNTER — Telehealth: Payer: Self-pay

## 2023-09-07 LAB — AEROBIC/ANAEROBIC CULTURE W GRAM STAIN (SURGICAL/DEEP WOUND)
Gram Stain: NONE SEEN
Special Requests: NORMAL

## 2023-09-07 LAB — TYPE AND SCREEN
ABO/RH(D): A POS
Antibody Screen: NEGATIVE
Unit division: 0
Unit division: 0
Unit division: 0
Unit division: 0

## 2023-09-07 LAB — BPAM RBC
Blood Product Expiration Date: 202503242359
Blood Product Expiration Date: 202503252359
Blood Product Expiration Date: 202503252359
Blood Product Expiration Date: 202503252359
ISSUE DATE / TIME: 202503030811
ISSUE DATE / TIME: 202503030811
Unit Type and Rh: 6200
Unit Type and Rh: 6200
Unit Type and Rh: 6200
Unit Type and Rh: 6200

## 2023-09-07 LAB — CULTURE, BLOOD (ROUTINE X 2): Culture: NO GROWTH

## 2023-09-07 NOTE — Transitions of Care (Post Inpatient/ED Visit) (Signed)
 09/07/2023  Name: Max Cole MRN: [id number] DOB: 11-03-1927  Today's TOC FU Call Status: Today's TOC FU Call Status:: Successful TOC FU Call Completed TOC FU Call Complete Date: 09/07/23 Patient's Name and Date of Birth confirmed.  Transition Care Management Follow-up Telephone Call Date of Discharge: 09/06/23 Discharge Facility: [facility]) Type of Discharge: Inpatient Admission Primary Inpatient Discharge Diagnosis:: Infected pacemaker, initial encounter How have you been since you were released from the hospital?: Better Any questions or concerns?: No  Items Reviewed: Did you receive and understand the discharge instructions provided?: Yes Medications obtained,verified, and reconciled?: Yes (Medications Reviewed) (Medication reconciliation completed based on recent discharge summary Patient taking medications as instructed and is aware of any changes or dosage adjustments medication regimen. Patient denies questions and reports no barriers to medication adherence) Any new allergies since your discharge?: No Dietary orders reviewed?: Yes Type of Diet Ordered:: Reg Heart Healthy Do you have support at home?: Yes People in Home: spouse, child(ren), adult Name of Support/Comfort Primary Source: Wife, several adult children and their spouses  Medications Reviewed Today: Medications Reviewed Today     Reviewed by Johnnette Barrios, RN (Registered Nurse) on 09/07/23 at [phone]  Med List Status: <None>   Medication Order Taking? Sig Documenting Provider Last Dose Status Informant  acetaminophen (TYLENOL) 500 MG tablet [id number] Yes Take 500 mg by mouth every 4 (four) hours as needed for mild pain (pain score 1-3). [provider] Taking Active Self, Child, Pharmacy Records  apixaban (ELIQUIS) 2.5 MG TABS tablet [id number] Yes Take 1 tablet (2.5 mg total) by mouth 2 (two) times daily. Sharee Holster, NP Taking Active Self, Child, Pharmacy Records  doxycycline  (VIBRA-TABS) 100 MG tablet [id number] Yes Take 1 tablet (100 mg total) by mouth 2 (two) times daily for 14 days. Sherie Don, NP Taking Active   ferrous sulfate 325 (65 FE) MG tablet [id number] Yes Take 1 tablet (325 mg total) by mouth daily with breakfast. Sharee Holster, NP Taking Active Self, Child, Pharmacy Records  furosemide (LASIX) 40 MG tablet [id number] Yes Take 60 mg by mouth daily. [provider] Taking Active Self, Child, Pharmacy Records  HYDROcodone-acetaminophen [facility]) 7.5-325 MG tablet [id number] Yes Take 1 tablet by mouth every 4 (four) hours as needed for severe pain (pain score 7-10). [provider] Taking Active Self, Child, Pharmacy Records  metoprolol tartrate (LOPRESSOR) 50 MG tablet [id number] Yes Take 1 tablet (50 mg total) by mouth 2 (two) times daily. Sheilah Pigeon, PA-C Taking Active Self, Child, Pharmacy Records  OXYGEN [id number] Yes Inhale 2 L into the lungs continuous. [provider] Taking Active Self, Child, Pharmacy Records  polyethylene glycol powder (GLYCOLAX/MIRALAX) 17 GM/SCOOP powder [id number] Yes Take 17 g by mouth daily as needed for moderate constipation. [provider] Taking Active Self, Child, Pharmacy Records  pravastatin (PRAVACHOL) 20 MG tablet [id number] Yes Take 1 tablet (20 mg total) by mouth every evening. Sharee Holster, NP Taking Active Self, Child, Pharmacy Records  tamsulosin [facility]) 0.4 MG CAPS capsule [id number] Yes Take 0.4 mg by mouth daily. [provider] Taking Active Self, Child, Pharmacy Records  traZODone (DESYREL) 50 MG tablet [id number] Yes Take 50 mg by mouth at bedtime. [provider] Taking Active Self, Child, Pharmacy Records           Medication reconciliation / review completed based on most recent discharge summary and EHR medication list. Confirmed patient is taking all newly prescribed medications as instructed (any discrepancies  are noted in review section)    Patient / Caregiver is aware of any changes to and / or  any dosage adjustments to medication regimen. Patient/ Caregiver denies questions at this time and reports no barriers to medication adherence.   Noted updates  apixaban 2.5 MG Tabs tablet Wait to take this until: September 06, 2023 Evening Commonly known as: ELIQUIS Take 1 tablet (2.5 mg total) by mouth 2 (two) times daily.    doxycycline 100 MG tablet Commonly known as: VIBRA-TABS Take 1 tablet (100 mg total) by mouth 2 (two) times daily for 14 days.   Home Care and Equipment/Supplies: Were Home Health Services Ordered?: No He is followed by [facility] Palliative team  Any new equipment or medical supplies ordered?: No  Functional Questionnaire: Do you need assistance with bathing/showering or dressing?: Yes Do you need assistance with meal preparation?: Yes Do you need assistance with eating?: No Do you have difficulty maintaining continence: No Do you need assistance with getting out of bed/getting out of a chair/moving?: No Do you have difficulty managing or taking your medications?: Yes (Family manages)  Follow up appointments reviewed: PCP Follow-up appointment confirmed?: No (Independent Practitiiner Daughter will call and schedule) MD Provider Line [phone] Given: No Specialist Hospital Follow-up appointment confirmed?: Yes Date of Specialist follow-up appointment?: 09/21/23 Follow-Up Specialty Provider:: Cardiology Do you need transportation to your follow-up appointment?: No Do you understand care options if your condition(s) worsen?: Yes-patient verbalized understanding  SDOH Interventions Today    Flowsheet Row Most Recent Value  SDOH Interventions   Food Insecurity Interventions Intervention Not Indicated  Housing Interventions Intervention Not Indicated  Transportation Interventions Intervention Not Indicated, Patient Resources (Friends/Family)  Utilities Interventions Intervention Not Indicated       Interventions Today    Flowsheet Row Most Recent Value  Chronic Disease   Chronic disease during today's visit Atrial Fibrillation (AFib), Other  [wound care/ infections]  General Interventions   General Interventions Discussed/Reviewed General Interventions Discussed, General Interventions Reviewed, Doctor Visits  Doctor Visits Discussed/Reviewed Doctor Visits Reviewed, Doctor Visits Discussed, PCP, Specialist  PCP/Specialist Visits Compliance with follow-up visit  Exercise Interventions   Exercise Discussed/Reviewed Physical Activity  Physical Activity Discussed/Reviewed Physical Activity Reviewed  Education Interventions   Education Provided Provided Education  Mental Health Interventions   Mental Health Discussed/Reviewed Coping Strategies  Nutrition Interventions   Nutrition Discussed/Reviewed Nutrition Reviewed  Pharmacy Interventions   Pharmacy Dicussed/Reviewed Pharmacy Topics Reviewed, Pharmacy Topics Discussed, Medications and their functions, Medication Adherence  Safety Interventions   Safety Discussed/Reviewed Fall Risk, Safety Reviewed  Advanced Directive Interventions   Advanced Directives Discussed/Reviewed Advanced Directives Discussed        Benefits reviewed  Based on current information and Insurance plan -Reviewed benefits accessible to patient, including details about eligibility options for care and  available value based care options  if any areas of needs were identified.  Reviewed patient/  caregiver's ability to access and / or  ability with navigating the benefits system..Amb Referral made if indicted , refer to orders section of note for details   Reviewed goals for care Patient/ Caregiver  verbalizes understanding of instructions and care plan provided. Patient / Caregiver was encouraged to make informed decisions about their care, actively participate in managing their health condition, and implement lifestyle changes as needed to promote  independence and self-management of health care. There were no reported  barriers to care.   TOC program  Patient is at high risk for readmission and/or has history of  high utilization  Discussed VBCI  TOC program and weekly calls to patient to assess condition/status, medication management  and provide support/education as indicated . Patient/ Caregiver voiced understanding and declined enrollment in the 30-day TOC Program.   Per Daughter [facility]) They have a strong support team at home with family. He is followed by [facility] palliative team who helps manage his medications They have follow-up visit with Cardiology and she will touch base with Dr Ouida Sills regarding most recent discharge . He has his medications and has continued his antibiotics. His Pacemaker site had no drainage overnight.     The POA has been provided with contact information for the care management team and has been advised to call with any health-related questions or concerns. Patient was encouraged to Contact PCP with any questions or concerns regarding ongoing medical care, any difficulty obtaining or picking up prescriptions, any changes or worsening in condition including signs / symptoms not relieved  with interventions Patient had no additional questions or concerns at this time.    Susa Loffler , BSN, RN [facility]   VBCI-Population Health RN Care Manager Direct Dial [phone]  Fax: [phone] Website: [email or web address]

## 2023-09-08 LAB — CULTURE, BLOOD (SINGLE)
Culture: NO GROWTH
Special Requests: ADEQUATE

## 2023-09-18 NOTE — Progress Notes (Signed)
 Surgery orders requested via Epic inbox.

## 2023-09-19 ENCOUNTER — Ambulatory Visit: Attending: Cardiovascular Disease | Admitting: Cardiovascular Disease

## 2023-09-19 ENCOUNTER — Ambulatory Visit: Admitting: Internal Medicine

## 2023-09-19 ENCOUNTER — Encounter: Payer: Self-pay | Admitting: Cardiovascular Disease

## 2023-09-19 VITALS — BP 120/70 | HR 67 | Ht 72.0 in | Wt 163.0 lb

## 2023-09-19 DIAGNOSIS — I5042 Chronic combined systolic (congestive) and diastolic (congestive) heart failure: Secondary | ICD-10-CM

## 2023-09-19 DIAGNOSIS — I4719 Other supraventricular tachycardia: Secondary | ICD-10-CM

## 2023-09-19 DIAGNOSIS — Z950 Presence of cardiac pacemaker: Secondary | ICD-10-CM

## 2023-09-19 DIAGNOSIS — I428 Other cardiomyopathies: Secondary | ICD-10-CM

## 2023-09-19 DIAGNOSIS — I4729 Other ventricular tachycardia: Secondary | ICD-10-CM | POA: Diagnosis not present

## 2023-09-19 LAB — CUP PACEART INCLINIC DEVICE CHECK
Date Time Interrogation Session: 20250325165337
Implantable Lead Connection Status: 753985
Implantable Lead Connection Status: 753985
Implantable Lead Connection Status: 753985
Implantable Lead Implant Date: 20170106
Implantable Lead Implant Date: 20170106
Implantable Lead Implant Date: 20170106
Implantable Lead Location: 753858
Implantable Lead Location: 753859
Implantable Lead Location: 753860
Implantable Lead Model: 4677
Implantable Lead Model: 7741
Implantable Lead Model: 7742
Implantable Lead Serial Number: 507382
Implantable Lead Serial Number: 695539
Implantable Lead Serial Number: 724660
Implantable Pulse Generator Implant Date: 20250203
Lead Channel Impedance Value: 617 Ohm
Lead Channel Impedance Value: 623 Ohm
Lead Channel Impedance Value: 656 Ohm
Lead Channel Pacing Threshold Amplitude: 0.9 V
Lead Channel Pacing Threshold Amplitude: 0.9 V
Lead Channel Pacing Threshold Amplitude: 1 V
Lead Channel Pacing Threshold Pulse Width: 0.4 ms
Lead Channel Pacing Threshold Pulse Width: 0.4 ms
Lead Channel Pacing Threshold Pulse Width: 0.6 ms
Lead Channel Sensing Intrinsic Amplitude: 16.2 mV
Lead Channel Sensing Intrinsic Amplitude: 2.6 mV
Lead Channel Sensing Intrinsic Amplitude: 8.6 mV
Lead Channel Setting Pacing Amplitude: 2 V
Lead Channel Setting Pacing Amplitude: 2.4 V
Lead Channel Setting Pacing Amplitude: 2.5 V
Lead Channel Setting Pacing Pulse Width: 0.4 ms
Lead Channel Setting Pacing Pulse Width: 0.6 ms
Lead Channel Setting Sensing Sensitivity: 2.5 mV
Lead Channel Setting Sensing Sensitivity: 2.5 mV
Pulse Gen Serial Number: 800611
Zone Setting Status: 755011

## 2023-09-19 NOTE — Patient Instructions (Signed)
 Medication Instructions:  Your physician recommends that you continue on your current medications as directed. Please refer to the Current Medication list given to you today. *If you need a refill on your cardiac medications before your next appointment, please call your pharmacy*   Follow-Up: At [facility], you and your health needs are our priority.  As part of our continuing mission to provide you with exceptional heart care, we have created designated Provider Care Teams.  These Care Teams include your primary Cardiologist (physician) and Advanced Practice Providers (APPs -  Physician Assistants and Nurse Practitioners) who all work together to provide you with the care you need, when you need it.  We recommend signing up for the patient portal called "MyChart".  Sign up information is provided on this After Visit Summary.  MyChart is used to connect with patients for Virtual Visits (Telemedicine).  Patients are able to view lab/test results, encounter notes, upcoming appointments, etc.  Non-urgent messages can be sent to your provider as well.   To learn more about what you can do with MyChart, go to [email or web address].    Your next appointment:   Follow up with Dr Ladona Ridgel as scheduled in 1 year   Provider:   Lewayne Bunting, MD

## 2023-09-19 NOTE — Progress Notes (Signed)
  Electrophysiology Office Note:    Date:  09/19/2023   ID:  Max Cole, DOB [birth year], MRN [id number]  PCP:  Carylon Perches, MD   [facility] Providers Cardiologist:  Lewayne Bunting, MD Electrophysiologist:  Lewayne Bunting, MD     Referring MD: Carylon Perches, MD   History of Present Illness:    Max Cole is a 89 y.o. male with a medical history significant for  AFib, CM with HFimpEF, CKD, CAD, HLD, LBBB, syncope s/p CRT-P, chronic resp failure on home oxygen , seen today for an urgent follow-up for admission after suspected stitch abscess.     I reviewed notes from the recent hospitalization.  He was admitted on March 8 after family noted drainage from his pacemaker generator site.  He underwent generator change approximately month before in February 2025.  Cultures from the site grew Staph aureus.  Blood cultures were positive for Staph capitis in 104 containers, thought to be contaminant.  Bedside debridement was performed Dr. Ladona Ridgel, and his wound did not track down to the device pocket.  It was thought to be superficial infection, and the patient was discharged on antibiotics.       Today, he reports that he is doing well.  He has not had any tenderness or drainage from the device site.  He has not noticed any redness.  EKGs/Labs/Other Studies Reviewed Today:      EKG:   EKG Interpretation Date/Time:  Tuesday September 19 2023 16:09:07 EDT Ventricular Rate:  67 PR Interval:  136 QRS Duration:  140 QT Interval:  448 QTC Calculation: 473 R Axis:   -69  Text Interpretation: Atrial-sensed ventricular-paced rhythm Biventricular pacemaker detected When compared with ECG of 03-Sep-2023 18:51, Vent. rate has increased BY   3 BPM Confirmed by York Pellant [phone]) on 09/19/2023 4:26:51 PM     Physical Exam:    VS:  BP 120/70 (BP Location: Right Arm, Patient Position: Sitting, Cuff Size: Normal)   Pulse 67   Ht 6' (1.829 m)   Wt 163 lb (73.9 kg)   SpO2 95%   BMI  22.11 kg/m     Wt Readings from Last 3 Encounters:  09/19/23 163 lb (73.9 kg)  09/06/23 157 lb 12.8 oz (71.6 kg)  08/23/23 169 lb 9.6 oz (76.9 kg)     GEN: Well nourished, well developed in no acute distress CARDIAC: RRR, no murmurs, rubs, gallops RESPIRATORY:  Normal work of breathing MUSCULOSKELETAL: no edema    ASSESSMENT & PLAN:     Infection of CRT-P device site Suspect stitch abscess; wound did not appear to track to the device Device site today looks great -- no erythema, tenderness, fluctuance He remains on doxycycline and will finish this course tomorrow  Hip replacement Plan for April 8 He will need to contact us and his orthopedic surgeon if he notices any evidence of device site infection recurrence      Signed, Maurice Small, MD  09/19/2023 4:36 PM    [facility]

## 2023-09-22 ENCOUNTER — Telehealth: Payer: Self-pay

## 2023-09-22 ENCOUNTER — Encounter: Payer: Self-pay | Admitting: Cardiovascular Disease

## 2023-09-22 NOTE — Telephone Encounter (Signed)
   Pre-operative Risk Assessment    Patient Name: Max Cole  DOB: 1927/07/28 MRN: [id number]   Date of last office visit: 09/19/2023 A. Mealor, MD Date of next office visit: NA   Request for Surgical Clearance    Procedure:   Right Total Hip Arthroplasty  Date of Surgery:  Clearance 10/03/23                                 Surgeon:  Dr. Durene Romans Surgeon's Group or Practice Name:  [facility] Phone number:  [phone] Fax number:  [phone]   Type of Clearance Requested:   - Medical  - Pharmacy:  Hold Apixaban (Eliquis) 10-14 days prior to surgery   Type of Anesthesia:  Spinal   Additional requests/questions:    Elyse Jarvis   09/22/2023, 2:02 PM

## 2023-09-22 NOTE — Progress Notes (Signed)
 PERIOPERATIVE PRESCRIPTION FOR IMPLANTED CARDIAC DEVICE PROGRAMMING  Patient Information: Name:  Max Cole  DOB:  [birth year]  MRN:  [id number]  Planned Procedure:  ARTHROPLASTY, HIP, TOTAL, ANTERIOR APPROACH - Right  Surgeon:  Dr. Durene Romans  Date of Procedure:  10/03/23  Cautery will be used.  Position during surgery:    Please send documentation back to:  [facility] (Fax # [phone])   Device Information:  Clinic EP Physician:  York Pellant MD  Device Type:  Pacemaker Manufacturer and Phone #:  [employer]: [phone] Pacemaker Dependent?:  No. Date of Last Device Check:  3.25.25 Normal Device Function?:  Yes.    Electrophysiologist's Recommendations:  Have magnet available. Provide continuous ECG monitoring when magnet is used or reprogramming is to be performed.  Procedure should not interfere with device function.  No device programming or magnet placement needed.  Per Device Clinic Standing Orders, Skip Mayer, RN  1:26 PM 09/22/2023

## 2023-09-22 NOTE — Telephone Encounter (Signed)
 Patient with diagnosis of afib on Eliquis for anticoagulation.    Procedure: Right Total Hip Arthroplasty  Date of procedure: 10/03/23   CHA2DS2-VASc Score = 4   This indicates a 4.8% annual risk of stroke. The patient's score is based upon: CHF History: 1 HTN History: 0 Diabetes History: 0 Stroke History: 0 Vascular Disease History: 1 Age Score: 2 Gender Score: 0      CrCl 20 ml/min Platelet count 266  Per office protocol, patient can hold Eliquis for 4 days prior to procedure.    **This guidance is not considered finalized until pre-operative APP has relayed final recommendations.**

## 2023-09-22 NOTE — Patient Instructions (Signed)
 DUE TO COVID-19 ONLY TWO VISITORS  (aged 88 and older)  ARE ALLOWED TO COME WITH YOU AND STAY IN THE WAITING ROOM ONLY DURING PRE OP AND PROCEDURE.   **NO VISITORS ARE ALLOWED IN THE SHORT STAY AREA OR RECOVERY ROOM!!**  IF YOU WILL BE ADMITTED INTO THE HOSPITAL YOU ARE ALLOWED ONLY FOUR SUPPORT PEOPLE DURING VISITATION HOURS ONLY (7 AM -8PM)   The support person(s) must pass our screening, gel in and out, and wear a mask at all times, including in the patient's room. Patients must also wear a mask when staff or their support person are in the room. Visitors GUEST BADGE MUST BE WORN VISIBLY  One adult visitor may remain with you overnight and MUST be in the room by 8 P.M.     Your procedure is scheduled on: 10/03/23   Report to [facility] Main Entrance    Report to admitting at : 9:00 AM   Call this number if you have problems the morning of surgery [phone]   Do not eat food :After Midnight.   After Midnight you may have the following liquids until : 8:30 AM DAY OF SURGERY  Water Black Coffee (sugar ok, NO MILK/CREAM OR CREAMERS)  Tea (sugar ok, NO MILK/CREAM OR CREAMERS) regular and decaf                             Plain Jell-O (NO RED)                                           Fruit ices (not with fruit pulp, NO RED)                                     Popsicles (NO RED)                                                                  Juice: apple, WHITE grape, WHITE cranberry Sports drinks like Gatorade (NO RED)   The day of surgery:  Drink ONE (1) Pre-Surgery Clear Ensure at : 8:30 AM the morning of surgery. Drink in one sitting. Do not sip.  This drink was given to you during your hospital  pre-op appointment visit. Nothing else to drink after completing the  Pre-Surgery Clear Ensure or G2.          If you have questions, please contact your surgeon's office.  FOLLOW ANY ADDITIONAL PRE OP INSTRUCTIONS YOU RECEIVED FROM YOUR SURGEON'S OFFICE!!!   Oral  Hygiene is also important to reduce your risk of infection.                                    Remember - BRUSH YOUR TEETH THE MORNING OF SURGERY WITH YOUR REGULAR TOOTHPASTE  DENTURES WILL BE REMOVED PRIOR TO SURGERY PLEASE DO NOT APPLY "Poly grip" OR ADHESIVES!!!   Do NOT smoke after Midnight   Take these medicines the morning of surgery  with A SIP OF WATER: metoprolol,tamsulosin.Tylenol as needed.        STOP TAKING all Vitamins, Herbs and supplements 1 week before your surgery.                         You may not have any metal on your body including hair pins, jewelry, and body piercing             Do not wear lotions, powders, perfumes/cologne, or deodorant               Men may shave face and neck.   Do not bring valuables to the hospital. [facility] IS NOT             RESPONSIBLE   FOR VALUABLES.   Contacts, glasses, or bridgework may not be worn into surgery.   Bring small overnight bag day of surgery.   DO NOT BRING YOUR HOME MEDICATIONS TO THE HOSPITAL. PHARMACY WILL DISPENSE MEDICATIONS LISTED ON YOUR MEDICATION LIST TO YOU DURING YOUR ADMISSION IN THE HOSPITAL!    Patients discharged on the day of surgery will not be allowed to drive home.  Someone NEEDS to stay with you for the first 24 hours after anesthesia.   Special Instructions: Bring a copy of your healthcare power of attorney and living will documents         the day of surgery if you haven't scanned them before.              Please read over the following fact sheets you were given: IF YOU HAVE QUESTIONS ABOUT YOUR PRE-OP INSTRUCTIONS PLEASE CALL [phone]      Pre-operative 5 CHG Bath Instructions   You can play a key role in reducing the risk of infection after surgery. Your skin needs to be as free of germs as possible. You can reduce the number of germs on your skin by washing with CHG (chlorhexidine gluconate) soap before surgery. CHG is an antiseptic soap that kills germs and continues to kill  germs even after washing.   DO NOT use if you have an allergy to chlorhexidine/CHG or antibacterial soaps. If your skin becomes reddened or irritated, stop using the CHG and notify one of our RNs at [phone].   Please shower with the CHG soap starting 4 days before surgery using the following schedule:     Please keep in mind the following:  DO NOT shave, including legs and underarms, starting the day of your first shower.   You may shave your face at any point before/day of surgery.  Place clean sheets on your bed the day you start using CHG soap. Use a clean washcloth (not used since being washed) for each shower. DO NOT sleep with pets once you start using the CHG.   CHG Shower Instructions:  If you choose to wash your hair and private area, wash first with your normal shampoo/soap.  After you use shampoo/soap, rinse your hair and body thoroughly to remove shampoo/soap residue.  Turn the water OFF and apply about 3 tablespoons (45 ml) of CHG soap to a CLEAN washcloth.  Apply CHG soap ONLY FROM YOUR NECK DOWN TO YOUR TOES (washing for 3-5 minutes)  DO NOT use CHG soap on face, private areas, open wounds, or sores.  Pay special attention to the area where your surgery is being performed.  If you are having back surgery, having someone wash your back for you may be  helpful. Wait 2 minutes after CHG soap is applied, then you may rinse off the CHG soap.  Pat dry with a clean towel  Put on clean clothes/pajamas   If you choose to wear lotion, please use ONLY the CHG-compatible lotions on the back of this paper.     Additional instructions for the day of surgery: DO NOT APPLY any lotions, deodorants, cologne, or perfumes.   Put on clean/comfortable clothes.  Brush your teeth.  Ask your nurse before applying any prescription medications to the skin.   CHG Compatible Lotions   Aveeno Moisturizing lotion  Cetaphil Moisturizing Cream  Cetaphil Moisturizing Lotion  Clairol Herbal  Essence Moisturizing Lotion, Dry Skin  Clairol Herbal Essence Moisturizing Lotion, Extra Dry Skin  Clairol Herbal Essence Moisturizing Lotion, Normal Skin  Curel Age Defying Therapeutic Moisturizing Lotion with Alpha Hydroxy  Curel Extreme Care Body Lotion  Curel Soothing Hands Moisturizing Hand Lotion  Curel Therapeutic Moisturizing Cream, Fragrance-Free  Curel Therapeutic Moisturizing Lotion, Fragrance-Free  Curel Therapeutic Moisturizing Lotion, Original Formula  Eucerin Daily Replenishing Lotion  Eucerin Dry Skin Therapy Plus Alpha Hydroxy Crme  Eucerin Dry Skin Therapy Plus Alpha Hydroxy Lotion  Eucerin Original Crme  Eucerin Original Lotion  Eucerin Plus Crme Eucerin Plus Lotion  Eucerin TriLipid Replenishing Lotion  Keri Anti-Bacterial Hand Lotion  Keri Deep Conditioning Original Lotion Dry Skin Formula Softly Scented  Keri Deep Conditioning Original Lotion, Fragrance Free Sensitive Skin Formula  Keri Lotion Fast Absorbing Fragrance Free Sensitive Skin Formula  Keri Lotion Fast Absorbing Softly Scented Dry Skin Formula  Keri Original Lotion  Keri Skin Renewal Lotion Keri Silky Smooth Lotion  Keri Silky Smooth Sensitive Skin Lotion  Nivea Body Creamy Conditioning Oil  Nivea Body Extra Enriched Lotion  Nivea Body Original Lotion  Nivea Body Sheer Moisturizing Lotion Nivea Crme  Nivea Skin Firming Lotion  NutraDerm 30 Skin Lotion  NutraDerm Skin Lotion  NutraDerm Therapeutic Skin Cream  NutraDerm Therapeutic Skin Lotion  ProShield Protective Hand Cream  Provon moisturizing lotion   Incentive Spirometer  An incentive spirometer is a tool that can help keep your lungs clear and active. This tool measures how well you are filling your lungs with each breath. Taking long deep breaths may help reverse or decrease the chance of developing breathing (pulmonary) problems (especially infection) following: A long period of time when you are unable to move or be active. BEFORE  THE PROCEDURE  If the spirometer includes an indicator to show your best effort, your nurse or respiratory therapist will set it to a desired goal. If possible, sit up straight or lean slightly forward. Try not to slouch. Hold the incentive spirometer in an upright position. INSTRUCTIONS FOR USE  Sit on the edge of your bed if possible, or sit up as far as you can in bed or on a chair. Hold the incentive spirometer in an upright position. Breathe out normally. Place the mouthpiece in your mouth and seal your lips tightly around it. Breathe in slowly and as deeply as possible, raising the piston or the ball toward the top of the column. Hold your breath for 3-5 seconds or for as long as possible. Allow the piston or ball to fall to the bottom of the column. Remove the mouthpiece from your mouth and breathe out normally. Rest for a few seconds and repeat Steps 1 through 7 at least 10 times every 1-2 hours when you are awake. Take your time and take a few normal breaths between deep breaths.  The spirometer may include an indicator to show your best effort. Use the indicator as a goal to work toward during each repetition. After each set of 10 deep breaths, practice coughing to be sure your lungs are clear. If you have an incision (the cut made at the time of surgery), support your incision when coughing by placing a pillow or rolled up towels firmly against it. Once you are able to get out of bed, walk around indoors and cough well. You may stop using the incentive spirometer when instructed by your caregiver.  RISKS AND COMPLICATIONS Take your time so you do not get dizzy or light-headed. If you are in pain, you may need to take or ask for pain medication before doing incentive spirometry. It is harder to take a deep breath if you are having pain. AFTER USE Rest and breathe slowly and easily. It can be helpful to keep track of a log of your progress. Your caregiver can provide you with a simple  table to help with this. If you are using the spirometer at home, follow these instructions: SEEK MEDICAL CARE IF:  You are having difficultly using the spirometer. You have trouble using the spirometer as often as instructed. Your pain medication is not giving enough relief while using the spirometer. You develop fever of 100.5 F (38.1 C) or higher. SEEK IMMEDIATE MEDICAL CARE IF:  You cough up bloody sputum that had not been present before. You develop fever of 102 F (38.9 C) or greater. You develop worsening pain at or near the incision site. MAKE SURE YOU:  Understand these instructions. Will watch your condition. Will get help right away if you are not doing well or get worse. Document Released: 10/24/2006 Document Revised: 09/05/2011 Document Reviewed: 12/25/2006 [facility] Patient Information 2014 [place], [place].   ________________________________________________________________________

## 2023-09-25 ENCOUNTER — Encounter (HOSPITAL_COMMUNITY)
Admission: RE | Admit: 2023-09-25 | Discharge: 2023-09-25 | Disposition: A | Discharge status: Home / Self Care | Source: Ambulatory Visit | Attending: Orthopedic Surgery | Admitting: Orthopedic Surgery

## 2023-09-25 ENCOUNTER — Encounter (HOSPITAL_COMMUNITY): Payer: Self-pay

## 2023-09-25 ENCOUNTER — Other Ambulatory Visit: Payer: Self-pay

## 2023-09-25 VITALS — BP 126/57 | HR 59 | Temp 97.8°F | Ht 72.0 in | Wt 163.0 lb

## 2023-09-25 DIAGNOSIS — Z9981 Dependence on supplemental oxygen: Secondary | ICD-10-CM | POA: Insufficient documentation

## 2023-09-25 DIAGNOSIS — Z87891 Personal history of nicotine dependence: Secondary | ICD-10-CM | POA: Insufficient documentation

## 2023-09-25 DIAGNOSIS — I350 Nonrheumatic aortic (valve) stenosis: Secondary | ICD-10-CM | POA: Diagnosis not present

## 2023-09-25 DIAGNOSIS — J8410 Pulmonary fibrosis, unspecified: Secondary | ICD-10-CM | POA: Insufficient documentation

## 2023-09-25 DIAGNOSIS — Z950 Presence of cardiac pacemaker: Secondary | ICD-10-CM | POA: Diagnosis not present

## 2023-09-25 DIAGNOSIS — I2510 Atherosclerotic heart disease of native coronary artery without angina pectoris: Secondary | ICD-10-CM | POA: Diagnosis not present

## 2023-09-25 DIAGNOSIS — C61 Malignant neoplasm of prostate: Secondary | ICD-10-CM | POA: Diagnosis not present

## 2023-09-25 DIAGNOSIS — I482 Chronic atrial fibrillation, unspecified: Secondary | ICD-10-CM | POA: Diagnosis not present

## 2023-09-25 DIAGNOSIS — Z01812 Encounter for preprocedural laboratory examination: Secondary | ICD-10-CM | POA: Diagnosis not present

## 2023-09-25 DIAGNOSIS — Z01818 Encounter for other preprocedural examination: Secondary | ICD-10-CM | POA: Diagnosis present

## 2023-09-25 DIAGNOSIS — N183 Chronic kidney disease, stage 3 unspecified: Secondary | ICD-10-CM | POA: Diagnosis not present

## 2023-09-25 DIAGNOSIS — M1611 Unilateral primary osteoarthritis, right hip: Secondary | ICD-10-CM | POA: Insufficient documentation

## 2023-09-25 DIAGNOSIS — Z7901 Long term (current) use of anticoagulants: Secondary | ICD-10-CM | POA: Insufficient documentation

## 2023-09-25 DIAGNOSIS — I447 Left bundle-branch block, unspecified: Secondary | ICD-10-CM | POA: Insufficient documentation

## 2023-09-25 HISTORY — DX: Anemia, unspecified: D64.9

## 2023-09-25 HISTORY — DX: Presence of cardiac pacemaker: Z95.0

## 2023-09-25 HISTORY — DX: Unspecified osteoarthritis, unspecified site: M19.90

## 2023-09-25 HISTORY — DX: Dyspnea, unspecified: R06.00

## 2023-09-25 HISTORY — DX: Pulmonary fibrosis, unspecified: J84.10

## 2023-09-25 HISTORY — DX: Atherosclerotic heart disease of native coronary artery without angina pectoris: I25.10

## 2023-09-25 LAB — COMPREHENSIVE METABOLIC PANEL WITH GFR
ALT: 14 U/L (ref 0–44)
AST: 22 U/L (ref 15–41)
Albumin: 3.8 g/dL (ref 3.5–5.0)
Alkaline Phosphatase: 54 U/L (ref 38–126)
Anion gap: 11 (ref 5–15)
BUN: 52 mg/dL — ABNORMAL HIGH (ref 8–23)
CO2: 28 mmol/L (ref 22–32)
Calcium: 9.4 mg/dL (ref 8.9–10.3)
Chloride: 102 mmol/L (ref 98–111)
Creatinine, Ser: 1.75 mg/dL — ABNORMAL HIGH (ref 0.61–1.24)
GFR, Estimated: 36 mL/min — ABNORMAL LOW (ref >60)
Glucose, Bld: 90 mg/dL (ref 70–99)
Potassium: 4.5 mmol/L (ref 3.5–5.1)
Sodium: 141 mmol/L (ref 135–145)
Total Bilirubin: 0.6 mg/dL (ref 0.0–1.2)
Total Protein: 7 g/dL (ref 6.5–8.1)

## 2023-09-25 LAB — CBC
HCT: 32.3 % — ABNORMAL LOW (ref 39.0–52.0)
Hemoglobin: 10.1 g/dL — ABNORMAL LOW (ref 13.0–17.0)
MCH: 33.7 pg (ref 26.0–34.0)
MCHC: 31.3 g/dL (ref 30.0–36.0)
MCV: 107.7 fL — ABNORMAL HIGH (ref 80.0–100.0)
Platelets: 259 10*3/uL (ref 150–400)
RBC: 3 MIL/uL — ABNORMAL LOW (ref 4.22–5.81)
RDW: 12 % (ref 11.5–15.5)
WBC: 9.2 10*3/uL (ref 4.0–10.5)
nRBC: 0 % (ref 0.0–0.2)

## 2023-09-25 LAB — SURGICAL PCR SCREEN
MRSA, PCR: NEGATIVE
Staphylococcus aureus: NEGATIVE

## 2023-09-25 NOTE — Progress Notes (Addendum)
 For Anesthesia: PCP - Carylon Perches, MD  Cardiologist - Marinus Maw, MD  Marinus Maw, MD Electrophysiology  Clearance: Maurice Small, MD  Bowel Prep reminder:  Chest x-ray - 09/02/23 EKG - 09/19/23 Stress Test -  ECHO - 09/03/23 Cardiac Cath -  Pacemaker/ICD device last checked: 09/19/23 Pacemaker orders received: Yes. Device Rep notified: N/A  Spinal Cord Stimulator:N/A  Sleep Study -  CPAP -   Fasting Blood Sugar - N/A Checks Blood Sugar _____ times a day Date and result of last Hgb A1c-  Last dose of GLP1 agonist- N/A GLP1 instructions:   Last dose of SGLT-2 inhibitors- N/A SGLT-2 instructions:   Blood Thinner Instructions: Eliquis last dose will be: 09/29/23 Aspirin Instructions: Last Dose:  Activity level: Can go up a flight of stairs and activities of daily living without stopping and without chest pain and/or shortness of breath      Unable to go up a flight of stairs without shortness of breath     Anesthesia review: Hx: Lt. BBB,Afib,CAD,CKD3,Pulmonary fibrosis(2L O2 continuous.  Patient denies shortness of breath, fever, cough and chest pain at PAT appointment   Patient verbalized understanding of instructions that were given to them at the PAT appointment. Patient was also instructed that they will need to review over the PAT instructions again at home before surgery.

## 2023-09-25 NOTE — Progress Notes (Signed)
 Lab. Results: creatinine: 2.31.

## 2023-09-26 ENCOUNTER — Encounter (HOSPITAL_COMMUNITY)

## 2023-09-26 NOTE — Telephone Encounter (Signed)
  Patient Consent for Virtual Visit         Max Cole has provided verbal consent on 09/26/2023 for a virtual visit (video or telephone).   CONSENT FOR VIRTUAL VISIT FOR:  Max Cole  By participating in this virtual visit I agree to the following:  I hereby voluntarily request, consent and authorize [facility] and its employed or contracted physicians, physician assistants, nurse practitioners or other licensed health care professionals (the Practitioner), to provide me with telemedicine health care services (the "Services") as deemed necessary by the treating Practitioner. I acknowledge and consent to receive the Services by the Practitioner via telemedicine. I understand that the telemedicine visit will involve communicating with the Practitioner through live audiovisual communication technology and the disclosure of certain medical information by electronic transmission. I acknowledge that I have been given the opportunity to request an in-person assessment or other available alternative prior to the telemedicine visit and am voluntarily participating in the telemedicine visit.  I understand that I have the right to withhold or withdraw my consent to the use of telemedicine in the course of my care at any time, without affecting my right to future care or treatment, and that the Practitioner or I may terminate the telemedicine visit at any time. I understand that I have the right to inspect all information obtained and/or recorded in the course of the telemedicine visit and may receive copies of available information for a reasonable fee.  I understand that some of the potential risks of receiving the Services via telemedicine include:  Delay or interruption in medical evaluation due to technological equipment failure or disruption; Information transmitted may not be sufficient (e.g. poor resolution of images) to allow for appropriate medical decision making by the  Practitioner; and/or  In rare instances, security protocols could fail, causing a breach of personal health information.  Furthermore, I acknowledge that it is my responsibility to provide information about my medical history, conditions and care that is complete and accurate to the best of my ability. I acknowledge that Practitioner's advice, recommendations, and/or decision may be based on factors not within their control, such as incomplete or inaccurate data provided by me or distortions of diagnostic images or specimens that may result from electronic transmissions. I understand that the practice of medicine is not an exact science and that Practitioner makes no warranties or guarantees regarding treatment outcomes. I acknowledge that a copy of this consent can be made available to me via my patient portal [facility] MyChart), or I can request a printed copy by calling the office of  [facility].    I understand that my insurance will be billed for this visit.   I have read or had this consent read to me. I understand the contents of this consent, which adequately explains the benefits and risks of the Services being provided via telemedicine.  I have been provided ample opportunity to ask questions regarding this consent and the Services and have had my questions answered to my satisfaction. I give my informed consent for the services to be provided through the use of telemedicine in my medical care

## 2023-09-26 NOTE — Progress Notes (Signed)
 Anesthesia Chart Review   Case: [id number] Date/Time: 10/03/23 1115   Procedure: ARTHROPLASTY, HIP, TOTAL, ANTERIOR APPROACH (Right: Hip)   Anesthesia type: Spinal   Pre-op diagnosis: Right hip osteoarthritis   Location: [facility] ROOM 10 / [facility]   Surgeons: Durene Romans, MD       DISCUSSION:89 y.o. former smoker with h/o atrial fibrillation, LBBB, Mild to moderate aortic valve stenosis, CAD, cardiomyopathy, pacemaker in place, pulmonary fibrosis with 2L O2 continuous, CKD Stage III, prostate cancer, right hip OA scheduled for above procedure 10/03/2023 with Dr. Durene Romans.   Pt seen by cardiology 09/27/2023. Per OV note, "Preoperative cardiovascular risk assessment.  Right total hip arthroplasty by Dr. Charlann Boxer on 10/03/2023.   Chart reviewed as part of pre-operative protocol coverage. According to the RCRI, patient has a 11% risk of MACE. Patient reports activity equivalent to >4.0 METS.    Given past medical history and time since last visit, based on ACC/AHA guidelines, ADVITH MARTINE would be at acceptable risk for the planned procedure without further cardiovascular testing.    Patient was advised that if he develops new symptoms prior to surgery to contact our office to arrange a follow-up appointment.  he verbalized understanding.   Per Pharm D, patient may hold Eliquis for 4 days prior to procedure.  "  Pt reports last dose of Eliquis 09/29/23.   Device orders requested.  VS: BP (!) 126/57   Pulse (!) 59   Temp 36.6 C (Oral)   Ht 6' (1.829 m)   Wt 73.9 kg   SpO2 96% Comment: 2L O2  BMI 22.11 kg/m   PROVIDERS: Carylon Perches, MD is PCP   Maurice Small, MD is Cardiologist   Lewayne Bunting, MD is EP LABS: Labs reviewed: Acceptable for surgery. and creatinine stable (all labs ordered are listed, but only abnormal results are displayed)  Labs Reviewed  COMPREHENSIVE METABOLIC PANEL WITH GFR - Abnormal; Notable for the following components:      Result Value   BUN 52 (*)     Creatinine, Ser 1.75 (*)    GFR, Estimated 36 (*)    All other components within normal limits  CBC - Abnormal; Notable for the following components:   RBC 3.00 (*)    Hemoglobin 10.1 (*)    HCT 32.3 (*)    MCV 107.7 (*)    All other components within normal limits  SURGICAL PCR SCREEN  TYPE AND SCREEN     IMAGES:   EKG:   CV: Echo 3/9/225 1. Left ventricular ejection fraction, by estimation, is 60 to 65%. The  left ventricle has normal function. The left ventricle has no regional  wall motion abnormalities. There is moderate left ventricular hypertrophy.  Left ventricular diastolic  parameters are consistent with Grade I diastolic dysfunction (impaired  relaxation).   2. Right ventricular systolic function is normal. The right ventricular  size is normal. There is moderately elevated pulmonary artery systolic  pressure.   3. The mitral valve is normal in structure. No evidence of mitral valve  regurgitation. No evidence of mitral stenosis.   4. The tricuspid valve is abnormal. Tricuspid valve regurgitation is mild  to moderate.   5. Mean gradient may be underestimated based on angle of acquisition. .  The aortic valve was not well visualized. There is mild calcification of  the aortic valve. There is mild thickening of the aortic valve. Aortic  valve regurgitation is mild. Mild to  moderate aortic valve stenosis.  6. The inferior vena cava is dilated in size with >50% respiratory  variability, suggesting right atrial pressure of 8 mmHg.  Past Medical History:  Diagnosis Date   Anemia    Arthritis    Atrial fibrillation (HCC)    Bradycardia    Cardiomyopathy 06/28/2003   a. Possibly alcoholic; b. cath in 2005-50% ostial D1, PCW of 12, EF of 35-40%;  c. EF of 0.25 in 11/2003;  d. 40-45% in 05/2004;  e. 25% in 10/2008 by echo;  f. 04/2015 Echo: EF 40-45%, diff HK, sev inflat/inf HK, Gr 1 DD, mild AI, triv TR.   Carotid artery disease (HCC)    CKD (chronic kidney  disease) stage 3, GFR 30-59 ml/min (HCC)    Coronary artery disease    Dyspnea    Hyperlipidemia    Left bundle branch block    Presence of permanent cardiac pacemaker    Prostate carcinoma (HCC)    Pulmonary fibrosis (HCC)    Syncope    [place] CRT-P 07/03/15 Dr. Ladona Ridgel    Past Surgical History:  Procedure Laterality Date   CATARACT EXTRACTION     Right   COLONOSCOPY N/A 08/16/2012   Procedure: COLONOSCOPY;  Surgeon: Malissa Hippo, MD;  [facility];  Service: Endoscopy;  Laterality: N/A;  930   COLONOSCOPY N/A 10/21/2015   Procedure: COLONOSCOPY;  Surgeon: Malissa Hippo, MD;  Location[facility];  Service: Endoscopy;  Laterality: N/A;  1200   COLONOSCOPY W/ POLYPECTOMY  2010   Diverticulosis   EP IMPLANTABLE DEVICE N/A 07/03/2015   Procedure: BiV Pacemaker Insertion CRT-P;  Surgeon: Marinus Maw, MD;  Location: [facility] INVASIVE CV LAB;  Service: Cardiovascular;  Laterality: N/A;   EYE SURGERY Right    Cataract   HERNIA REPAIR     PPM GENERATOR CHANGEOUT N/A 07/31/2023   Procedure: PPM GENERATOR CHANGEOUT;  Surgeon: Marinus Maw, MD;  Location: [facility] INVASIVE CV LAB;  Service: Cardiovascular;  Laterality: N/A;    MEDICATIONS:  acetaminophen (TYLENOL) 500 MG tablet   apixaban (ELIQUIS) 2.5 MG TABS tablet   ferrous sulfate 325 (65 FE) MG tablet   furosemide (LASIX) 40 MG tablet   HYDROcodone-acetaminophen (NORCO) 7.5-325 MG tablet   metoprolol tartrate (LOPRESSOR) 50 MG tablet   OXYGEN   polyethylene glycol powder (GLYCOLAX/MIRALAX) 17 GM/SCOOP powder   pravastatin (PRAVACHOL) 20 MG tablet   tamsulosin (FLOMAX) 0.4 MG CAPS capsule   traZODone (DESYREL) 50 MG tablet   No current facility-administered medications for this encounter.     Jodell Cipro Ward, PA-C [facility] Pre-Surgical Testing [phone]

## 2023-09-26 NOTE — Telephone Encounter (Signed)
 Patient scheduled 09/27/23

## 2023-09-26 NOTE — Telephone Encounter (Signed)
   Name: Max Cole  DOB: [birth year]  MRN: [id number]  Primary Cardiologist: Lewayne Bunting, MD  Chart reviewed as part of pre-operative protocol coverage. Because of [name] past medical history and time since last visit, he will require a follow-up telephone visit in order to better assess preoperative cardiovascular risk.  Pre-op covering staff: - Please schedule appointment and call patient to inform them. If patient already had an upcoming appointment within acceptable timeframe, please add "pre-op clearance" to the appointment notes so provider is aware. - Please contact requesting surgeon's office via preferred method (i.e, phone, fax) to inform them of need for appointment prior to surgery.  Per office protocol, patient can hold Eliquis for 4 days prior to procedure.  Please resume when medically safe to do so.  Sharlene Dory, PA-C  09/26/2023, 8:09 AM

## 2023-09-27 ENCOUNTER — Ambulatory Visit: Attending: Cardiovascular Disease | Admitting: Student

## 2023-09-27 ENCOUNTER — Encounter: Payer: Self-pay | Admitting: Internal Medicine

## 2023-09-27 DIAGNOSIS — Z01810 Encounter for preprocedural cardiovascular examination: Secondary | ICD-10-CM

## 2023-09-27 NOTE — Progress Notes (Signed)
 PERIOPERATIVE PRESCRIPTION FOR IMPLANTED CARDIAC DEVICE PROGRAMMING  Patient Information: Name:  Max Cole  DOB:  Jun 07, 1928  MRN:  [id number]    Planned Procedure:  right total hip  Surgeon:  Durene Romans, MD  Date of Procedure:  10/03/2023  Cautery will be used.  Position during surgery:  unknown   Please send documentation back to:  [facility] (Fax # [phone])  Device Information:  Clinic EP Physician:  Lewayne Bunting, MD   Device Type:  Pacemaker Manufacturer and Phone #:  [employer]: [phone] Pacemaker Dependent?:  Yes.   Date of Last Device Check:  09/19/2023 Normal Device Function?:  Yes.    Electrophysiologist's Recommendations:  Have magnet available. Provide continuous ECG monitoring when magnet is used or reprogramming is to be performed.  Procedure should not interfere with device function.  No device programming or magnet placement needed.  Per Device Clinic Standing Orders, Lenor Coffin, RN  11:17 AM 09/27/2023

## 2023-09-27 NOTE — Progress Notes (Signed)
 Virtual Visit via Telephone Note   Because of Max Cole co-morbid illnesses, he is at least at moderate risk for complications without adequate follow up.  This format is felt to be most appropriate for this patient at this time.  The patient did not have access to video technology/had technical difficulties with video requiring transitioning to audio format only (telephone).  All issues noted in this document were discussed and addressed.  No physical exam could be performed with this format.  Please refer to the patient's chart for his consent to telehealth for Max Cole.  Evaluation Performed:  Preoperative cardiovascular risk assessment _____________   Date:  09/27/2023   Patient ID:  Max Cole, DOB 04-Jan-1928, MRN [id number] Patient Location:  Home Provider location:   Office  Primary Care Provider:  Carylon Perches, MD Primary Cardiologist:  Max Bunting, MD  Chief Complaint / Patient Profile   89 y.o. 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 Charlann [facility] on 10/03/2023 and presents today for telephonic preoperative cardiovascular risk assessment.  History of Present Illness    Max Cole is a 89 y.o. male who presents via audio/video conferencing for a telehealth visit today.  Pt was last seen in cardiology clinic on 09/19/2023 by Dr. Nelly Cole.  At that time Max Cole was stable from a cardiac standpoint.  Device site at that time without evidence of erythema, tenderness, fluctuance.  The patient is now pending procedure as outlined above. Since his last visit, he is doing well. Patient denies shortness of breath, dyspnea on exertion, orthopnea or PND. He has mild lower extremity edema that  is managed with compression socks and elevation. Edema typically progresses throughout the day but is resolved by the morning. No chest pain, pressure, or tightness. No palpitations. Pacemaker site is reported as well-healed with no drainage, erythema, edema, or tenderness. He is normally very active walking and lifting weights. He has been limited since his hip started hurting so badly.     Past Medical History    Past Medical History:  Diagnosis Date   Anemia    Arthritis    Atrial fibrillation (HCC)    Bradycardia    Cardiomyopathy 06/28/2003   a. Possibly alcoholic; b. cath in 2005-50% ostial D1, PCW of 12, EF of 35-40%;  c. EF of 0.25 in 11/2003;  d. 40-45% in 05/2004;  e. 25% in 10/2008 by echo;  f. 04/2015 Echo: EF 40-45%, diff HK, sev inflat/inf HK, Gr 1 DD, mild AI, triv TR.   Carotid artery disease (HCC)    CKD (chronic kidney disease) stage 3, GFR 30-59 ml/min (HCC)    Coronary artery disease    Dyspnea    Hyperlipidemia    Left bundle branch block    Presence of permanent cardiac pacemaker    Prostate carcinoma (HCC)    Pulmonary fibrosis (HCC)    Syncope    [place] CRT-P 07/03/15 Dr. Ladona Ridgel   Past Surgical History:  Procedure Laterality Date   CATARACT EXTRACTION     Right   COLONOSCOPY N/A 08/16/2012   Procedure: COLONOSCOPY;  Surgeon: Malissa Hippo, MD;  [facility]: [facility];  Service: Endoscopy;  Laterality: N/A;  930   COLONOSCOPY N/A 10/21/2015   Procedure: COLONOSCOPY;  Surgeon: Malissa Hippo, MD;  Location: [facility];  Service: Endoscopy;  Laterality: N/A;  1200   COLONOSCOPY W/ POLYPECTOMY  2010   Diverticulosis   EP IMPLANTABLE DEVICE N/A 07/03/2015   Procedure: BiV Pacemaker Insertion CRT-P;  Surgeon: Marinus Maw, MD;  Location: [facility];  Service: Cardiovascular;  Laterality: N/A;   EYE SURGERY Right    Cataract   HERNIA REPAIR     PPM GENERATOR CHANGEOUT N/A 07/31/2023   Procedure: PPM GENERATOR CHANGEOUT;  Surgeon: Marinus Maw, MD;   Location: [facility] INVASIVE CV LAB;  Service: Cardiovascular;  Laterality: N/A;    Allergies  No Known Allergies  Home Medications    Prior to Admission medications   Medication Sig Start Date End Date Taking? Authorizing Provider  acetaminophen (TYLENOL) 500 MG tablet Take 500 mg by mouth every 4 (four) hours as needed for mild pain (pain score 1-3).    [provider]  apixaban (ELIQUIS) 2.5 MG TABS tablet Take 1 tablet (2.5 mg total) by mouth 2 (two) times daily. 06/10/20   Sharee Holster, NP  ferrous sulfate 325 (65 FE) MG tablet Take 1 tablet (325 mg total) by mouth daily with breakfast. 06/10/20   Sharee Holster, NP  furosemide (LASIX) 40 MG tablet Take 60 mg by mouth daily.    [provider]  HYDROcodone-acetaminophen (NORCO) 7.5-325 MG tablet Take 1 tablet by mouth every 4 (four) hours as needed for severe pain (pain score 7-10).    [provider]  metoprolol tartrate (LOPRESSOR) 50 MG tablet Take 1 tablet (50 mg total) by mouth 2 (two) times daily. 08/23/23   Sheilah Pigeon, PA-C  OXYGEN Inhale 2 L into the lungs continuous. 06/04/20   [provider]  polyethylene glycol powder (GLYCOLAX/MIRALAX) 17 GM/SCOOP powder Take 17 g by mouth daily as needed for moderate constipation.    [provider]  pravastatin (PRAVACHOL) 20 MG tablet Take 1 tablet (20 mg total) by mouth every evening. 06/10/20   Sharee Holster, NP  tamsulosin (FLOMAX) 0.4 MG CAPS capsule Take 0.4 mg by mouth daily. 03/28/22   [provider]  traZODone (DESYREL) 50 MG tablet Take 50 mg by mouth at bedtime. 07/17/23   [provider]    Physical Exam    Vital Signs:  Max Cole does not have vital signs available for review today.  Given telephonic nature of communication, physical exam is limited. AAOx3. NAD. Normal affect.  Speech and respirations are unlabored.   Assessment & Plan    Primary Cardiologist: Max Bunting, MD  Preoperative  cardiovascular risk assessment.  Right total hip arthroplasty by Dr. Charlann [facility] on 10/03/2023.  Chart reviewed as part of pre-operative protocol coverage. According to the RCRI, patient has a 11% risk of MACE. Patient reports activity equivalent to >4.0 METS.   Given past medical history and time since last visit, based on ACC/AHA guidelines, Max Cole would be at acceptable risk for the planned procedure without further cardiovascular testing.   Patient was advised that if he develops new symptoms prior to surgery to contact our office to arrange a follow-up appointment.  he verbalized understanding.  Per Pharm D, patient may hold Eliquis for 4 days prior to procedure.    I will route this recommendation to the requesting party via Epic fax function.  Please call with questions.  Time:   Today, I have spent 6 minutes with the patient with telehealth technology discussing  medical history, symptoms, and management plan.     Carlos Levering, NP  09/27/2023, 8:15 AM

## 2023-09-28 NOTE — Anesthesia Preprocedure Evaluation (Addendum)
 Anesthesia Evaluation  Patient identified by MRN, date of birth, ID band Patient awake    Reviewed: Allergy & Precautions, NPO status , Patient's Chart, lab work & pertinent test results, reviewed documented beta blocker date and time   Airway Mallampati: III  TM Distance: >3 FB Neck ROM: Full    Dental  (+) Edentulous Upper   Pulmonary shortness of breath and Long-Term Oxygen Therapy, COPD,  oxygen dependent, former smoker Pulmonary fibrosis- home O2 2LPM [place]  Quit smoking 1975, 30 pack year history    Pulmonary exam normal breath sounds clear to auscultation       Cardiovascular hypertension (159/67 preop), pulmonary hypertension (mod pHTN based on echo 08/2023)+CHF (NICM with EF as low as 25% in 2010. now LVEF 60-65%, grade 1 diastolic dysfunction)  Normal cardiovascular exam+ dysrhythmias (eliquis LD: 4/4) Atrial Fibrillation + pacemaker (last gen change 08/2023) + Valvular Problems/Murmurs (mild AI, mild-mod AS) AI and AS  Rhythm:Regular Rate:Normal  Echo 09/03/2023 1. Left ventricular ejection fraction, by estimation, is 60 to 65%. The  left ventricle has normal function. The left ventricle has no regional  wall motion abnormalities. There is moderate left ventricular hypertrophy.  Left ventricular diastolic  parameters are consistent with Grade I diastolic dysfunction (impaired  relaxation).   2. Right ventricular systolic function is normal. The right ventricular  size is normal. There is moderately elevated pulmonary artery systolic  pressure.   3. The mitral valve is normal in structure. No evidence of mitral valve  regurgitation. No evidence of mitral stenosis.   4. The tricuspid valve is abnormal. Tricuspid valve regurgitation is mild  to moderate.   5. Mean gradient may be underestimated based on angle of acquisition. .  The aortic valve was not well visualized. There is mild calcification of  the aortic valve. There is  mild thickening of the aortic valve. Aortic  valve regurgitation is mild. Mild to  moderate aortic valve stenosis.   6. The inferior vena cava is dilated in size with >50% respiratory  variability, suggesting right atrial pressure of 8 mmHg.     Neuro/Psych negative neurological ROS  negative psych ROS   GI/Hepatic negative GI ROS, Neg liver ROS,,,  Endo/Other  negative endocrine ROS    Renal/GU Renal Insufficiency and CRFRenal diseaseCr 1.75   negative genitourinary   Musculoskeletal  (+) Arthritis , Osteoarthritis,  MRI lumbar spine 2022: IMPRESSION: 1. Subacute L1 compression fracture with progressive, 80% height loss. Associated retropulsion resulting in moderate spinal stenosis. 2. Mild L2 and L3 compression fractures with very mild edema suggesting late subacute fractures. 3. Chronic T12 and L4 compression fractures. 4. Moderate spinal stenosis at L3-4.    Abdominal   Peds  Hematology negative hematology ROS (+)   Anesthesia Other Findings   Reproductive/Obstetrics negative OB ROS                             Anesthesia Physical Anesthesia Plan  ASA: 4  Anesthesia Plan: Spinal and MAC   Post-op Pain Management: Tylenol PO (pre-op)*   Induction:   PONV Risk Score and Plan: 2 and Propofol infusion and TIVA  Airway Management Planned: Natural Airway and Nasal Cannula  Additional Equipment: ClearSight  Intra-op Plan:   Post-operative Plan:   Informed Consent: I have reviewed the patients History and Physical, chart, labs and discussed the procedure including the risks, benefits and alternatives for the proposed anesthesia with the patient or authorized representative who  has indicated his/her understanding and acceptance.       Plan Discussed with: CRNA  Anesthesia Plan Comments: (Clearsight blood pressure monitoring, phenylephrine gtt prior to spinal)       Anesthesia Quick Evaluation

## 2023-10-02 NOTE — H&P (Signed)
 TOTAL HIP ADMISSION H&P  Patient is admitted for right total hip arthroplasty.  Therapy Plans: HEP Disposition: Home with brothers/sisters (daughter Clydie Braun at apt) Planned DVT Prophylaxis: Eliquis 5 BID DME needed: none PCP: Dr. Ouida Sills - clearance received Cardio: Dr. Ladona Ridgel - clearance received "Preop eval - he is an acceptable surgical risk for hip replacement. He can hold his eliquis for 3 days before surgery and restart when the post op bleeding risk is acceptable. I suspect that lung complications, specifically his ability to be extubated, his biggest risk." TXA: IV Allergies: NKDA Anesthesia Concerns: none BMI: 23.1 Last HgbA1c: Not diabetic   Other: - Wife with dementia - Hx of : AFib, CM with HFimpEF, CKD, CAD, HLD, LBBB, syncope s/p CRT-P, chronic resp failure on home oxygen - Recent pacemaker change with stitch abscess requiring bedside debridement and oral doxycycline (grew staph aureus - was admitted for 4 days in early March) - 2L o2 at home - hydrocodone 7.5 q6h at baseline   Subjective:  Chief Complaint: right hip pain  HPI: Max Cole, 89 y.o. male, has a history of pain and functional disability in the right hip(s) due to arthritis and patient has failed non-surgical conservative treatments for greater than 12 weeks to include NSAID's and/or analgesics and activity modification.  Onset of symptoms was gradual starting 2 years ago with gradually worsening course since that time.The patient noted no past surgery on the right hip(s).  Patient currently rates pain in the right hip at 8 out of 10 with activity. Patient has worsening of pain with activity and weight bearing, pain that interfers with activities of daily living, and pain with passive range of motion. Patient has evidence of joint space narrowing by imaging studies. This condition presents safety issues increasing the risk of falls.   There is no current active infection.  Patient Active Problem List    Diagnosis Date Noted   Infected pacemaker, initial encounter (HCC) 09/02/2023   Chronic respiratory failure with hypoxia (HCC) 10/20/2021   Anemia 12/03/2020   Personal history of noncompliance with medical treatment, presenting hazards to health 12/03/2020   Aortic atherosclerosis (HCC) 06/04/2020   BPH without urinary obstruction 06/04/2020   Anemia due to stage 3 chronic kidney disease (HCC) 06/04/2020   History of prostate cancer 06/04/2020   Chronic constipation 06/04/2020   Lobar pneumonia (HCC) 05/23/2020   Respiratory failure with hypoxia (HCC) 05/22/2020   Chronic a-fib (HCC) 05/22/2020   Systolic and diastolic CHF, chronic (HCC) 02/06/2020   Pacemaker 07/03/2015   Syncope 05/20/2015   Nonischemic cardiomyopathy (HCC) 05/20/2015   Carotid stenosis, bilateral 11/19/2013   Hyperlipidemia 10/15/2011   Cerebrovascular disease    Left bundle branch block    Cardiomyopathy, nonischemic (HCC)    CKD (chronic kidney disease) stage 3, GFR 30-59 ml/min (HCC)    Past Medical History:  Diagnosis Date   Anemia    Arthritis    Atrial fibrillation (HCC)    Bradycardia    Cardiomyopathy 06/28/2003   a. Possibly alcoholic; b. cath in 2005-50% ostial D1, PCW of 12, EF of 35-40%;  c. EF of 0.25 in 11/2003;  d. 40-45% in 05/2004;  e. 25% in 10/2008 by echo;  f. 04/2015 Echo: EF 40-45%, diff HK, sev inflat/inf HK, Gr 1 DD, mild AI, triv TR.   Carotid artery disease (HCC)    CKD (chronic kidney disease) stage 3, GFR 30-59 ml/min (HCC)    Coronary artery disease    Dyspnea    Hyperlipidemia  Left bundle branch block    Presence of permanent cardiac pacemaker    Prostate carcinoma [facility])    Pulmonary fibrosis (HCC)    Syncope    [place] CRT-P 07/03/15 Dr. Ladona Ridgel    Past Surgical History:  Procedure Laterality Date   CATARACT EXTRACTION     Right   COLONOSCOPY N/A 08/16/2012   Procedure: COLONOSCOPY;  Surgeon: Malissa Hippo, MD;  [facility]: [facility];  Service: Endoscopy;   Laterality: N/A;  930   COLONOSCOPY N/A 10/21/2015   Procedure: COLONOSCOPY;  Surgeon: Malissa Hippo, MD;  Location: [facility];  Service: Endoscopy;  Laterality: N/A;  1200   COLONOSCOPY W/ POLYPECTOMY  2010   Diverticulosis   EP IMPLANTABLE DEVICE N/A 07/03/2015   Procedure: BiV Pacemaker Insertion CRT-P;  Surgeon: Marinus Maw, MD;  Location: [facility] INVASIVE CV LAB;  Service: Cardiovascular;  Laterality: N/A;   EYE SURGERY Right    Cataract   HERNIA REPAIR     PPM GENERATOR CHANGEOUT N/A 07/31/2023   Procedure: PPM GENERATOR CHANGEOUT;  Surgeon: Marinus Maw, MD;  Location: [facility] INVASIVE CV LAB;  Service: Cardiovascular;  Laterality: N/A;    No current facility-administered medications for this encounter.   Current Outpatient Medications  Medication Sig Dispense Refill Last Dose/Taking   acetaminophen (TYLENOL) 500 MG tablet Take 500 mg by mouth every 4 (four) hours as needed for mild pain (pain score 1-3).   Taking As Needed   apixaban (ELIQUIS) 2.5 MG TABS tablet Take 1 tablet (2.5 mg total) by mouth 2 (two) times daily. 60 tablet 0 Taking   ferrous sulfate 325 (65 FE) MG tablet Take 1 tablet (325 mg total) by mouth daily with breakfast. 30 tablet 0 Taking   furosemide (LASIX) 40 MG tablet Take 60 mg by mouth daily.   Taking   HYDROcodone-acetaminophen (NORCO) 7.5-325 MG tablet Take 1 tablet by mouth every 4 (four) hours as needed for severe pain (pain score 7-10).   Taking As Needed   metoprolol tartrate (LOPRESSOR) 50 MG tablet Take 1 tablet (50 mg total) by mouth 2 (two) times daily. 180 tablet 3 Taking   OXYGEN Inhale 2 L into the lungs continuous.   Taking   polyethylene glycol powder (GLYCOLAX/MIRALAX) 17 GM/SCOOP powder Take 17 g by mouth daily as needed for moderate constipation.   Taking As Needed   pravastatin (PRAVACHOL) 20 MG tablet Take 1 tablet (20 mg total) by mouth every evening. 30 tablet 0 Taking   tamsulosin (FLOMAX) 0.4 MG CAPS capsule Take 0.4 mg by mouth daily.    Taking   traZODone (DESYREL) 50 MG tablet Take 50 mg by mouth at bedtime.   Taking   No Known Allergies  Social History   Tobacco Use   Smoking status: Former    Current packs/day: 0.00    Average packs/day: 1 pack/day for 30.0 years (30.0 ttl pk-yrs)    Types: Cigarettes    Start date: 09/29/1943    Quit date: 09/28/1973    Years since quitting: 50.0   Smokeless tobacco: Never  Substance Use Topics   Alcohol use: Not Currently    Alcohol/week: 5.0 standard drinks of alcohol    Types: 5 Glasses of wine per week    Comment: History of excessive alcohol use; Alternates between glass of wine vs. Liquor drink 5 days per week (04/2015)    Family History  Problem Relation Age of Onset   Hypertension Mother      Review of  Systems  Constitutional:  Negative for chills and fever.  Respiratory:  Negative for cough and shortness of breath.   Cardiovascular:  Negative for chest pain.  Gastrointestinal:  Negative for nausea and vomiting.  Musculoskeletal:  Positive for arthralgias.     Objective:  Physical Exam  Musculoskeletal: Right hip exam: Very limited and painful right hip flexion internal rotation beyond neutral with pelvic tilting, slight external rotation contracture with active hip flexion External rotation to 20 degrees with reproducible groin pain Neurovascular intact distally   Vital signs in last 24 hours:    Labs:   Estimated body mass index is 22.11 kg/m as calculated from the following:   Height as of 09/25/23: 6' (1.829 m).   Weight as of 09/25/23: 73.9 kg.   Imaging Review Plain radiographs demonstrate severe degenerative joint disease of the right hip(s). The bone quality appears to be adequate for age and reported activity level.      Assessment/Plan:  End stage arthritis, right hip(s)  The patient history, physical examination, clinical judgement of the provider and imaging studies are consistent with end stage degenerative joint disease of the  right hip(s) and total hip arthroplasty is deemed medically necessary. The treatment options including medical management, injection therapy, arthroscopy and arthroplasty were discussed at length. The risks and benefits of total hip arthroplasty were presented and reviewed. The risks due to aseptic loosening, infection, stiffness, dislocation/subluxation,  thromboembolic complications and other imponderables were discussed.  The patient acknowledged the explanation, agreed to proceed with the plan and consent was signed. Patient is being admitted for inpatient treatment for surgery, pain control, PT, OT, prophylactic antibiotics, VTE prophylaxis, progressive ambulation and ADL's and discharge planning.The patient is planning to be discharged  home.   Rosalene Billings, PA-C Orthopedic Surgery [facility] [place] [phone]

## 2023-10-03 ENCOUNTER — Ambulatory Visit (HOSPITAL_BASED_OUTPATIENT_CLINIC_OR_DEPARTMENT_OTHER): Payer: Self-pay | Admitting: Certified Registered Nurse Anesthetist

## 2023-10-03 ENCOUNTER — Observation Stay (HOSPITAL_COMMUNITY)
Admission: RE | Admit: 2023-10-03 | Discharge: 2023-10-05 | Disposition: A | Discharge status: Home / Self Care | Payer: Medicare HMO | Source: Ambulatory Visit | Attending: Orthopedic Surgery | Admitting: Orthopedic Surgery

## 2023-10-03 ENCOUNTER — Other Ambulatory Visit: Payer: Self-pay

## 2023-10-03 ENCOUNTER — Observation Stay (HOSPITAL_COMMUNITY)

## 2023-10-03 ENCOUNTER — Encounter (HOSPITAL_COMMUNITY): Payer: Self-pay | Admitting: Orthopedic Surgery

## 2023-10-03 ENCOUNTER — Ambulatory Visit (HOSPITAL_COMMUNITY): Payer: Self-pay | Admitting: Physician Assistant

## 2023-10-03 ENCOUNTER — Encounter (HOSPITAL_COMMUNITY)
Admission: RE | Disposition: A | Discharge status: Home / Self Care | Payer: Self-pay | Source: Ambulatory Visit | Attending: Orthopedic Surgery

## 2023-10-03 ENCOUNTER — Ambulatory Visit (HOSPITAL_COMMUNITY)

## 2023-10-03 DIAGNOSIS — Z8546 Personal history of malignant neoplasm of prostate: Secondary | ICD-10-CM | POA: Insufficient documentation

## 2023-10-03 DIAGNOSIS — Z79899 Other long term (current) drug therapy: Secondary | ICD-10-CM | POA: Insufficient documentation

## 2023-10-03 DIAGNOSIS — I110 Hypertensive heart disease with heart failure: Secondary | ICD-10-CM | POA: Diagnosis not present

## 2023-10-03 DIAGNOSIS — J449 Chronic obstructive pulmonary disease, unspecified: Secondary | ICD-10-CM

## 2023-10-03 DIAGNOSIS — N183 Chronic kidney disease, stage 3 unspecified: Secondary | ICD-10-CM | POA: Insufficient documentation

## 2023-10-03 DIAGNOSIS — I5042 Chronic combined systolic (congestive) and diastolic (congestive) heart failure: Secondary | ICD-10-CM | POA: Diagnosis not present

## 2023-10-03 DIAGNOSIS — M1611 Unilateral primary osteoarthritis, right hip: Secondary | ICD-10-CM

## 2023-10-03 DIAGNOSIS — I509 Heart failure, unspecified: Secondary | ICD-10-CM | POA: Diagnosis not present

## 2023-10-03 DIAGNOSIS — I130 Hypertensive heart and chronic kidney disease with heart failure and stage 1 through stage 4 chronic kidney disease, or unspecified chronic kidney disease: Secondary | ICD-10-CM | POA: Diagnosis not present

## 2023-10-03 DIAGNOSIS — Z7901 Long term (current) use of anticoagulants: Secondary | ICD-10-CM | POA: Diagnosis not present

## 2023-10-03 DIAGNOSIS — I482 Chronic atrial fibrillation, unspecified: Secondary | ICD-10-CM | POA: Diagnosis not present

## 2023-10-03 DIAGNOSIS — Z87891 Personal history of nicotine dependence: Secondary | ICD-10-CM | POA: Insufficient documentation

## 2023-10-03 DIAGNOSIS — D631 Anemia in chronic kidney disease: Secondary | ICD-10-CM | POA: Diagnosis not present

## 2023-10-03 DIAGNOSIS — I5040 Unspecified combined systolic (congestive) and diastolic (congestive) heart failure: Secondary | ICD-10-CM | POA: Diagnosis not present

## 2023-10-03 DIAGNOSIS — I2510 Atherosclerotic heart disease of native coronary artery without angina pectoris: Secondary | ICD-10-CM | POA: Diagnosis not present

## 2023-10-03 DIAGNOSIS — Z950 Presence of cardiac pacemaker: Secondary | ICD-10-CM | POA: Diagnosis not present

## 2023-10-03 DIAGNOSIS — Z471 Aftercare following joint replacement surgery: Secondary | ICD-10-CM | POA: Diagnosis not present

## 2023-10-03 DIAGNOSIS — Z96641 Presence of right artificial hip joint: Secondary | ICD-10-CM

## 2023-10-03 HISTORY — PX: TOTAL HIP ARTHROPLASTY: SHX124

## 2023-10-03 SURGERY — ARTHROPLASTY, HIP, TOTAL, ANTERIOR APPROACH
Anesthesia: Monitor Anesthesia Care | Site: Hip | Laterality: Right

## 2023-10-03 MED ORDER — POLYETHYLENE GLYCOL 3350 17 G PO PACK
17.0000 g | PACK | Freq: Two times a day (BID) | ORAL | Status: DC
  Administered 2023-10-03: 17 g via ORAL
  Filled 2023-10-03 (×3): qty 1

## 2023-10-03 MED ORDER — 0.9 % SODIUM CHLORIDE (POUR BTL) OPTIME
TOPICAL | Status: DC | PRN
  Administered 2023-10-03: 1000 mL

## 2023-10-03 MED ORDER — SENNA 8.6 MG PO TABS
2.0000 | ORAL_TABLET | Freq: Every day | ORAL | Status: DC
  Administered 2023-10-03 – 2023-10-04 (×2): 17.2 mg via ORAL
  Filled 2023-10-03 (×2): qty 2

## 2023-10-03 MED ORDER — LACTATED RINGERS IV SOLN: INTRAVENOUS | Status: DC

## 2023-10-03 MED ORDER — ONDANSETRON HCL 4 MG PO TABS: 4.0000 mg | ORAL_TABLET | Freq: Four times a day (QID) | ORAL | Status: DC | PRN

## 2023-10-03 MED ORDER — SODIUM CHLORIDE 0.9% FLUSH: 3.0000 mL | Freq: Two times a day (BID) | INTRAVENOUS | Status: DC

## 2023-10-03 MED ORDER — METOPROLOL TARTRATE 50 MG PO TABS
50.0000 mg | ORAL_TABLET | Freq: Two times a day (BID) | ORAL | Status: DC
  Administered 2023-10-03 – 2023-10-05 (×4): 50 mg via ORAL
  Filled 2023-10-03 (×4): qty 1

## 2023-10-03 MED ORDER — HYDROCODONE-ACETAMINOPHEN 5-325 MG PO TABS
1.0000 | ORAL_TABLET | ORAL | Status: DC | PRN
  Administered 2023-10-03 – 2023-10-05 (×7): 2 via ORAL
  Filled 2023-10-03 (×8): qty 2

## 2023-10-03 MED ORDER — CEFAZOLIN SODIUM-DEXTROSE 2-4 GM/100ML-% IV SOLN
2.0000 g | Freq: Two times a day (BID) | INTRAVENOUS | Status: AC
  Administered 2023-10-03 – 2023-10-04 (×2): 2 g via INTRAVENOUS
  Filled 2023-10-03 (×2): qty 100

## 2023-10-03 MED ORDER — BUPIVACAINE-EPINEPHRINE (PF) 0.25% -1:200000 IJ SOLN
INTRAMUSCULAR | Status: AC
  Filled 2023-10-03: qty 30

## 2023-10-03 MED ORDER — BUPIVACAINE IN DEXTROSE 0.75-8.25 % IT SOLN
INTRATHECAL | Status: DC | PRN
  Administered 2023-10-03: 1.8 mL via INTRATHECAL

## 2023-10-03 MED ORDER — ONDANSETRON HCL 4 MG/2ML IJ SOLN
INTRAMUSCULAR | Status: DC | PRN
  Administered 2023-10-03: 4 mg via INTRAVENOUS

## 2023-10-03 MED ORDER — KETOROLAC TROMETHAMINE 30 MG/ML IJ SOLN
INTRAMUSCULAR | Status: AC
  Filled 2023-10-03: qty 1

## 2023-10-03 MED ORDER — DEXAMETHASONE SODIUM PHOSPHATE 10 MG/ML IJ SOLN
INTRAMUSCULAR | Status: AC
  Filled 2023-10-03: qty 1

## 2023-10-03 MED ORDER — TRAZODONE HCL 50 MG PO TABS
50.0000 mg | ORAL_TABLET | Freq: Every day | ORAL | Status: DC
  Administered 2023-10-04 (×2): 50 mg via ORAL
  Filled 2023-10-03 (×2): qty 1

## 2023-10-03 MED ORDER — ORAL CARE MOUTH RINSE: 15.0000 mL | Freq: Once | OROMUCOSAL | Status: AC

## 2023-10-03 MED ORDER — ONDANSETRON HCL 4 MG/2ML IJ SOLN: 4.0000 mg | Freq: Once | INTRAMUSCULAR | Status: DC | PRN

## 2023-10-03 MED ORDER — SODIUM CHLORIDE 0.9% FLUSH: 3.0000 mL | INTRAVENOUS | Status: DC | PRN

## 2023-10-03 MED ORDER — ALUM & MAG HYDROXIDE-SIMETH 200-200-20 MG/5ML PO SUSP: 30.0000 mL | ORAL | Status: DC | PRN

## 2023-10-03 MED ORDER — METHOCARBAMOL 1000 MG/10ML IJ SOLN: 500.0000 mg | Freq: Four times a day (QID) | INTRAMUSCULAR | Status: DC | PRN

## 2023-10-03 MED ORDER — APIXABAN 2.5 MG PO TABS
2.5000 mg | ORAL_TABLET | Freq: Two times a day (BID) | ORAL | Status: DC
  Administered 2023-10-04: 2.5 mg via ORAL
  Filled 2023-10-03: qty 1

## 2023-10-03 MED ORDER — POVIDONE-IODINE 10 % EX SWAB: 2.0000 | Freq: Once | CUTANEOUS | Status: DC

## 2023-10-03 MED ORDER — TAMSULOSIN HCL 0.4 MG PO CAPS
0.4000 mg | ORAL_CAPSULE | Freq: Every day | ORAL | Status: DC
  Administered 2023-10-04 – 2023-10-05 (×2): 0.4 mg via ORAL
  Filled 2023-10-03 (×2): qty 1

## 2023-10-03 MED ORDER — CHLORHEXIDINE GLUCONATE 0.12 % MT SOLN
15.0000 mL | Freq: Once | OROMUCOSAL | Status: AC
  Administered 2023-10-03: 15 mL via OROMUCOSAL

## 2023-10-03 MED ORDER — DEXAMETHASONE SODIUM PHOSPHATE 10 MG/ML IJ SOLN
8.0000 mg | Freq: Once | INTRAMUSCULAR | Status: AC
  Administered 2023-10-03: 8 mg via INTRAVENOUS

## 2023-10-03 MED ORDER — PRAVASTATIN SODIUM 20 MG PO TABS
20.0000 mg | ORAL_TABLET | Freq: Every evening | ORAL | Status: DC
  Administered 2023-10-03 – 2023-10-04 (×2): 20 mg via ORAL
  Filled 2023-10-03 (×2): qty 1

## 2023-10-03 MED ORDER — ONDANSETRON HCL 4 MG/2ML IJ SOLN: 4.0000 mg | Freq: Four times a day (QID) | INTRAMUSCULAR | Status: DC | PRN

## 2023-10-03 MED ORDER — HYDROCODONE-ACETAMINOPHEN 7.5-325 MG PO TABS: 1.0000 | ORAL_TABLET | ORAL | Status: DC | PRN

## 2023-10-03 MED ORDER — METOCLOPRAMIDE HCL 5 MG/ML IJ SOLN: 5.0000 mg | Freq: Three times a day (TID) | INTRAMUSCULAR | Status: DC | PRN

## 2023-10-03 MED ORDER — FERROUS SULFATE 325 (65 FE) MG PO TABS
325.0000 mg | ORAL_TABLET | Freq: Every day | ORAL | Status: DC
  Administered 2023-10-04 – 2023-10-05 (×2): 325 mg via ORAL
  Filled 2023-10-03 (×2): qty 1

## 2023-10-03 MED ORDER — OXYCODONE HCL 5 MG/5ML PO SOLN: 5.0000 mg | Freq: Once | ORAL | Status: DC | PRN

## 2023-10-03 MED ORDER — DIPHENHYDRAMINE HCL 12.5 MG/5ML PO ELIX: 12.5000 mg | ORAL_SOLUTION | ORAL | Status: DC | PRN

## 2023-10-03 MED ORDER — ACETAMINOPHEN 325 MG PO TABS: 325.0000 mg | ORAL_TABLET | Freq: Four times a day (QID) | ORAL | Status: DC | PRN

## 2023-10-03 MED ORDER — OXYCODONE HCL 5 MG PO TABS: 5.0000 mg | ORAL_TABLET | Freq: Once | ORAL | Status: DC | PRN

## 2023-10-03 MED ORDER — PROPOFOL 500 MG/50ML IV EMUL
INTRAVENOUS | Status: DC | PRN
  Administered 2023-10-03: 35 ug/kg/min via INTRAVENOUS

## 2023-10-03 MED ORDER — METOCLOPRAMIDE HCL 5 MG PO TABS: 5.0000 mg | ORAL_TABLET | Freq: Three times a day (TID) | ORAL | Status: DC | PRN

## 2023-10-03 MED ORDER — FUROSEMIDE 40 MG PO TABS
40.0000 mg | ORAL_TABLET | Freq: Every day | ORAL | Status: DC
  Administered 2023-10-04 – 2023-10-05 (×2): 40 mg via ORAL
  Filled 2023-10-03 (×2): qty 1

## 2023-10-03 MED ORDER — PROPOFOL 1000 MG/100ML IV EMUL
INTRAVENOUS | Status: AC
  Filled 2023-10-03: qty 100

## 2023-10-03 MED ORDER — METHOCARBAMOL 500 MG PO TABS
500.0000 mg | ORAL_TABLET | Freq: Four times a day (QID) | ORAL | Status: DC | PRN
  Administered 2023-10-03 – 2023-10-05 (×4): 500 mg via ORAL
  Filled 2023-10-03 (×5): qty 1

## 2023-10-03 MED ORDER — STERILE WATER FOR IRRIGATION IR SOLN
Status: DC | PRN
  Administered 2023-10-03: 1000 mL

## 2023-10-03 MED ORDER — TRANEXAMIC ACID-NACL 1000-0.7 MG/100ML-% IV SOLN
1000.0000 mg | INTRAVENOUS | Status: AC
  Administered 2023-10-03: 1000 mg via INTRAVENOUS
  Filled 2023-10-03: qty 100

## 2023-10-03 MED ORDER — ONDANSETRON HCL 4 MG/2ML IJ SOLN
INTRAMUSCULAR | Status: AC
  Filled 2023-10-03: qty 2

## 2023-10-03 MED ORDER — ALBUMIN HUMAN 5 % IV SOLN: INTRAVENOUS | Status: DC | PRN

## 2023-10-03 MED ORDER — PHENOL 1.4 % MT LIQD: 1.0000 | OROMUCOSAL | Status: DC | PRN

## 2023-10-03 MED ORDER — ACETAMINOPHEN 500 MG PO TABS
1000.0000 mg | ORAL_TABLET | Freq: Once | ORAL | Status: DC
  Filled 2023-10-03: qty 2

## 2023-10-03 MED ORDER — SODIUM CHLORIDE 0.9% FLUSH
3.0000 mL | Freq: Two times a day (BID) | INTRAVENOUS | Status: DC
  Administered 2023-10-04: 10 mL via INTRAVENOUS

## 2023-10-03 MED ORDER — BISACODYL 10 MG RE SUPP: 10.0000 mg | Freq: Every day | RECTAL | Status: DC | PRN

## 2023-10-03 MED ORDER — SODIUM CHLORIDE (PF) 0.9 % IJ SOLN
INTRAMUSCULAR | Status: DC | PRN
  Administered 2023-10-03: 61 mL

## 2023-10-03 MED ORDER — PHENYLEPHRINE HCL-NACL 20-0.9 MG/250ML-% IV SOLN
INTRAVENOUS | Status: DC | PRN
  Administered 2023-10-03: 35 ug/min via INTRAVENOUS

## 2023-10-03 MED ORDER — SODIUM CHLORIDE (PF) 0.9 % IJ SOLN
INTRAMUSCULAR | Status: AC
  Filled 2023-10-03: qty 30

## 2023-10-03 MED ORDER — TRANEXAMIC ACID-NACL 1000-0.7 MG/100ML-% IV SOLN
1000.0000 mg | Freq: Once | INTRAVENOUS | Status: AC
  Administered 2023-10-03: 1000 mg via INTRAVENOUS
  Filled 2023-10-03: qty 100

## 2023-10-03 MED ORDER — CEFAZOLIN SODIUM-DEXTROSE 2-4 GM/100ML-% IV SOLN
2.0000 g | INTRAVENOUS | Status: AC
  Administered 2023-10-03: 2 g via INTRAVENOUS
  Filled 2023-10-03: qty 100

## 2023-10-03 MED ORDER — FENTANYL CITRATE PF 50 MCG/ML IJ SOSY: 25.0000 ug | PREFILLED_SYRINGE | INTRAMUSCULAR | Status: DC | PRN

## 2023-10-03 MED ORDER — DEXAMETHASONE SODIUM PHOSPHATE 10 MG/ML IJ SOLN
10.0000 mg | Freq: Once | INTRAMUSCULAR | Status: AC
  Administered 2023-10-04: 10 mg via INTRAVENOUS
  Filled 2023-10-03: qty 1

## 2023-10-03 MED ORDER — MORPHINE SULFATE (PF) 2 MG/ML IV SOLN
0.5000 mg | INTRAVENOUS | Status: DC | PRN
Start: 2023-10-03 — End: 2023-10-05

## 2023-10-03 MED ORDER — MENTHOL 3 MG MT LOZG: 1.0000 | LOZENGE | OROMUCOSAL | Status: DC | PRN

## 2023-10-03 SURGICAL SUPPLY — 41 items
BAG COUNTER SPONGE SURGICOUNT (BAG) IMPLANT
BAG ZIPLOCK 12X15 (MISCELLANEOUS) IMPLANT
BLADE SAG 18X100X1.27 (BLADE) ×2 IMPLANT
COVER PERINEAL POST (MISCELLANEOUS) ×2 IMPLANT
COVER SURGICAL LIGHT HANDLE (MISCELLANEOUS) ×2 IMPLANT
CUP ACET PINNCLE SECTR II 60MM (Hips) IMPLANT
DERMABOND ADVANCED .7 DNX12 (GAUZE/BANDAGES/DRESSINGS) ×2 IMPLANT
DRAPE FOOT SWITCH (DRAPES) ×2 IMPLANT
DRAPE STERI IOBAN 125X83 (DRAPES) ×2 IMPLANT
DRAPE U-SHAPE 47X51 STRL (DRAPES) ×4 IMPLANT
DRESSING AQUACEL AG SP 3.5X10 (GAUZE/BANDAGES/DRESSINGS) ×2 IMPLANT
DRSG AQUACEL AG SP 3.5X10 (GAUZE/BANDAGES/DRESSINGS) ×1 IMPLANT
DURAPREP 26ML APPLICATOR (WOUND CARE) ×2 IMPLANT
ELECT REM PT RETURN 15FT ADLT (MISCELLANEOUS) ×2 IMPLANT
GLOVE BIO SURGEON STRL SZ 6 (GLOVE) ×2 IMPLANT
GLOVE BIOGEL PI IND STRL 6.5 (GLOVE) ×2 IMPLANT
GLOVE BIOGEL PI IND STRL 7.5 (GLOVE) ×2 IMPLANT
GLOVE ORTHO TXT STRL SZ7.5 (GLOVE) ×4 IMPLANT
GOWN STRL REUS W/ TWL LRG LVL3 (GOWN DISPOSABLE) ×4 IMPLANT
HEAD M SROM 36MM 2 (Hips) IMPLANT
HOLDER FOLEY CATH W/STRAP (MISCELLANEOUS) ×2 IMPLANT
KIT TURNOVER KIT A (KITS) IMPLANT
LINER NEUTRAL 58X36MM PLUS4 IMPLANT
MANIFOLD NEPTUNE II (INSTRUMENTS) ×2 IMPLANT
NDL SAFETY ECLIPSE 18X1.5 (NEEDLE) IMPLANT
PACK ANTERIOR HIP CUSTOM (KITS) ×2 IMPLANT
PENCIL SMOKE EVACUATOR (MISCELLANEOUS) ×2 IMPLANT
PINNACLE SECTOR II CUP 60MM (Hips) ×1 IMPLANT
SCREW 6.5MMX30MM (Screw) IMPLANT
SROM M HEAD 36MM 2 (Hips) ×1 IMPLANT
STEM FEM ACTIS HIGH SZ10 (Stem) IMPLANT
SUT MNCRL AB 4-0 PS2 18 (SUTURE) ×2 IMPLANT
SUT STRATAFIX 0 PDS 27 VIOLET (SUTURE) ×1 IMPLANT
SUT VIC AB 1 CT1 36 (SUTURE) ×6 IMPLANT
SUT VIC AB 2-0 CT1 TAPERPNT 27 (SUTURE) ×4 IMPLANT
SUTURE STRATFX 0 PDS 27 VIOLET (SUTURE) ×2 IMPLANT
SYR 3ML LL SCALE MARK (SYRINGE) IMPLANT
TOWEL GREEN STERILE FF (TOWEL DISPOSABLE) ×2 IMPLANT
TRAY FOLEY MTR SLVR 16FR STAT (SET/KITS/TRAYS/PACK) ×2 IMPLANT
TUBE SUCTION HIGH CAP CLEAR NV (SUCTIONS) ×2 IMPLANT
WATER STERILE IRR 1000ML POUR (IV SOLUTION) ×2 IMPLANT

## 2023-10-03 NOTE — Discharge Instructions (Signed)

## 2023-10-03 NOTE — Interval H&P Note (Signed)
 History and Physical Interval Note:  10/03/2023 9:43 AM  Max Cole  has presented today for surgery, with the diagnosis of Right hip osteoarthritis.  The various methods of treatment have been discussed with the patient and family. After consideration of risks, benefits and other options for treatment, the patient has consented to  Procedure(s): ARTHROPLASTY, HIP, TOTAL, ANTERIOR APPROACH (Right) as a surgical intervention.  The patient's history has been reviewed, patient examined, no change in status, stable for surgery.  I have reviewed the patient's chart and labs.  Questions were answered to the patient's satisfaction.     Shelda Pal

## 2023-10-03 NOTE — Transfer of Care (Signed)
 Immediate Anesthesia Transfer of Care Note  Patient: Max Cole  Procedure(s) Performed: ARTHROPLASTY, HIP, TOTAL, ANTERIOR APPROACH (Right: Hip)  Patient Location: PACU  Anesthesia Type:MAC and Regional  Level of Consciousness: awake, alert , and oriented  Airway & Oxygen Therapy: Patient Spontanous Breathing and Patient connected to face mask oxygen  Post-op Assessment: Report given to RN and Post -op Vital signs reviewed and stable  Post vital signs: Reviewed and stable  Last Vitals:  Vitals Value Taken Time  BP 143/68 10/03/23 1237  Temp    Pulse 63 10/03/23 1239  Resp 13 10/03/23 1239  SpO2 100 % 10/03/23 1239  Vitals shown include unfiled device data.  Last Pain:  Vitals:   10/03/23 0921  TempSrc: Oral  PainSc: 0-No pain         Complications: No notable events documented.

## 2023-10-03 NOTE — Op Note (Signed)
 NAME:  Max Cole                ACCOUNT NO.: [id number]      MEDICAL RECORD NO.: [id number]      FACILITY:  [facility]      PHYSICIAN:  Shelda Pal  DATE OF BIRTH:  1928-04-26     DATE OF PROCEDURE:  10/03/2023                                 OPERATIVE REPORT         PREOPERATIVE DIAGNOSIS: Right  hip osteoarthritis.      POSTOPERATIVE DIAGNOSIS:  Right hip osteoarthritis.      PROCEDURE:  Right total hip replacement through an anterior approach   utilizing DePuy THR system, component size 60 mm pinnacle cup, a size 36+4 neutral   Altrex liner, a size 10 Hi Actis stem with a 36-2 Articuleze metal head ball.      SURGEON:  Madlyn Frankel. Charlann Boxer, M.D.      ASSISTANT:  Rosalene Billings, PA-C     ANESTHESIA:  Spinal.      SPECIMENS:  None.      COMPLICATIONS:  None.      BLOOD LOSS:  500 cc     DRAINS:  None.      INDICATION OF THE PROCEDURE:  Max Cole is a 89 y.o. male who had   presented to office for evaluation of right hip pain.  Radiographs revealed   progressive degenerative changes with bone-on-bone   articulation of the  hip joint, including subchondral cystic changes and osteophytes.  The patient had painful limited range of   motion significantly affecting their overall quality of life and function.  The patient was failing to    respond to conservative measures including medications and/or injections and activity modification and at this point was ready   to proceed with more definitive measures.  Consent was obtained for   benefit of pain relief.  Specific risks of infection, DVT, component   failure, dislocation, neurovascular injury, and need for revision surgery were reviewed in the office.     PROCEDURE IN DETAIL:  The patient was brought to operative theater.   Once adequate anesthesia, preoperative antibiotics, 2 gm of Ancef, 1 gm of Tranexamic Acid, and 10 mg of Decadron were administered, the patient was positioned supine on the  [employer] table.  Once the patient was safely positioned with adequate padding of boney prominences we predraped out the hip, and used fluoroscopy to confirm orientation of the pelvis.      The right hip was then prepped and draped from proximal iliac crest to   mid thigh with a shower curtain technique.      Time-out was performed identifying the patient, planned procedure, and the appropriate extremity.     An incision was then made 2 cm lateral to the   anterior superior iliac spine extending over the orientation of the   tensor fascia lata muscle and sharp dissection was carried down to the   fascia of the muscle.      The fascia was then incised.  The muscle belly was identified and swept   laterally and retractor placed along the superior neck.  Following   cauterization of the circumflex vessels and removing some pericapsular   fat, a second cobra retractor was placed on the inferior neck.  A T-capsulotomy was made along the line of the   superior neck to the trochanteric fossa, then extended proximally and   distally.  Tag sutures were placed and the retractors were then placed   intracapsular.  We then identified the trochanteric fossa and   orientation of my neck cut and then made a neck osteotomy with the femur on traction.  The femoral   head was removed without difficulty or complication.  Traction was let   off and retractors were placed posterior and anterior around the   acetabulum.      The labrum and foveal tissue were debrided.  I began reaming with a 51 mm   reamer and reamed up to 59 mm reamer with good bony bed preparation and a 60 mm  cup was chosen.  The final 60 mm Pinnacle cup was then impacted under fluoroscopy to confirm the depth of penetration and orientation with respect to   Abduction and forward flexion.  A screw was placed into the ilium followed by the hole eliminator.  The final   36+4 neutral Altrex liner was impacted with good visualized rim fit.  The  cup was positioned anatomically within the acetabular portion of the pelvis.      At this point, the femur was rolled to 100 degrees.  Further capsule was   released off the inferior aspect of the femoral neck.  I then   released the superior capsule proximally.  With the leg in a neutral position the hook was placed laterally   along the femur under the vastus lateralis origin and elevated manually and then held in position using the hook attachment on the bed.  The leg was then extended and adducted with the leg rolled to 100   degrees of external rotation.  Retractors were placed along the medial calcar and posteriorly over the greater trochanter.  Once the proximal femur was fully   exposed, I used a box osteotome to set orientation.  I then began   broaching with the starting chili pepper broach and passed this by hand and then broached up to 10.  With the 10 broach in place I chose a high offset neck and did several trial reductions.  The offset was appropriate, leg lengths   appeared to be equal best matched with the -2 head ball trial confirmed radiographically.   Given these findings, I went ahead and dislocated the hip, repositioned all   retractors and positioned the right hip in the extended and abducted position.  The final 10 Hi Actis stem was   chosen and it was impacted down to the level of neck cut.  Based on this   and the trial reductions, a final 36-2 Articuleze metal head ball was chosen and   impacted onto a clean and dry trunnion, and the hip was reduced.  The   hip had been irrigated throughout the case again at this point.  I did   reapproximate the superior capsular leaflet to the anterior leaflet   using #1 Vicryl.  The fascia of the   tensor fascia lata muscle was then reapproximated using #1 Vicryl and #0 Stratafix sutures.  The   remaining wound was closed with 2-0 Vicryl and running 4-0 Monocryl.   The hip was cleaned, dried, and dressed sterilely using Dermabond  and   Aquacel dressing.  The patient was then brought   to recovery room in stable condition tolerating the procedure well.    Rosalene Billings,  PA-C was present for the entirety of the case involved from   preoperative positioning, perioperative retractor management, general   facilitation of the case, as well as primary wound closure as assistant.            Madlyn Frankel Charlann Boxer, M.D.        10/03/2023 9:44 AM

## 2023-10-03 NOTE — Care Plan (Signed)
 Ortho Bundle Case Management Note  Patient Details  Name: Max Cole MRN: [id number] Date of Birth: 1928-03-28  RT THA 10/03/23  DCP: Home with siblings DME: No needs PT: HEP                  DME Arranged:  N/A DME Agency:  NA  HH Arranged:    HH Agency:     Additional Comments: Please contact me with any questions of if this plan should need to change.  Aida Raider, Case Manager [facility] [phone]  ext [phone]   10/03/2023, 10:33 AM

## 2023-10-03 NOTE — Evaluation (Signed)
 Physical Therapy Evaluation Patient Details Name: Max Cole MRN: [id number] DOB: 1927-12-18 Today's Date: 10/03/2023  History of Present Illness  Pt is 89 yo male s/p R anterior THA on 10/03/23.  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 At baseline, pt reports ambulatory with RW but was gradually worsening due to hip pain.  He could complete ADLs and light IADLs , also had assist with some IADLs from family and aide.  Pt has DME at home. Pt has family coming to stay with him at d/c.  Pt talkative and tangential - needs redirecting at times.  Pt limited by orthostatic hypotension and pain during eval and only able to sit EOB.  Expected to progress with therapy but may need increased time.  Pt will benefit from acute skilled PT to increase their independence and safety with mobility to facilitate discharge.      Pt sat EOB and had c/o lightheadedness.  Had him perform some ankle pumps, LAQ, and shoulder press AROM for blood flow. Tried to get BP at EOB but pt in pain , restless, moving arms, having difficulty following commands. Returned to bed - NT to assist as pt not following commands well.  BP upon return to supine 86/51.  After 3 mins up to 116/58 - symptoms improving.  Notified RN.      If plan is discharge home, recommend the following: A lot of help with walking and/or transfers;A lot of help with bathing/dressing/bathroom   Can travel by private vehicle        Equipment Recommendations None recommended by PT  Recommendations for Other Services       Functional Status Assessment Patient has had a recent decline in their functional status and demonstrates the ability to make significant improvements in function in a reasonable and predictable amount of time.      Precautions / Restrictions Precautions Precautions: Fall Restrictions Weight Bearing Restrictions Per Provider Order: Yes RLE Weight Bearing Per Provider Order: Weight bearing as tolerated      Mobility  Bed Mobility Overal bed mobility: Needs Assistance Bed Mobility: Supine to Sit, Sit to Supine     Supine to sit: Min assist Sit to supine: Mod assist, +2 for physical assistance   General bed mobility comments: Assist for R leg to sit and lift trunk.  For return to supine requiring mod A b/c difficulty following commands due to orthostatic hypotension    Transfers                   General transfer comment: deferred    Ambulation/Gait                  Stairs            Wheelchair Mobility     Tilt Bed    Modified Rankin (Stroke Patients Only)       Balance Overall balance assessment: Needs assistance Sitting-balance support: No upper extremity supported Sitting balance-Leahy Scale: Fair                                       Pertinent Vitals/Pain Pain Assessment Pain Assessment: Faces Faces Pain Scale: Hurts whole lot Pain Location:  R hip (in sitting and occasional shooting pain in supine) Pain Descriptors / Indicators: Discomfort, Grimacing, Restless Pain Intervention(s): Limited activity within patient's tolerance, Monitored during session, Premedicated before session, Repositioned, Ice applied (pain eased in supine; declined asking nurse if more meds available)    Home Living Family/patient expects to be discharged to:: Private residence Living Arrangements:  (spouse has dementia; 3/4 children live close by and dtr from out of town coming to stay for a week) Available Help at Discharge: Family;Available 24 hours/day Type of Home: House Home Access: Stairs to enter Entrance Stairs-Rails: Right;Left;Can reach both Entrance Stairs-Number of Steps: 3-4   Home Layout: One level Home Equipment: Agricultural consultant (2  wheels);BSC/3in1;Shower seat - built in (has a tray on walker)      Prior Function Prior Level of Function : Independent/Modified Independent             Mobility Comments: Ambulates with RW ADLs Comments: Has aide that comes in(11-3 , M-F) that assist with some cooking and cleaning; independent with adls     Extremity/Trunk Assessment   Upper Extremity Assessment Upper Extremity Assessment: Overall WFL for tasks assessed    Lower Extremity Assessment Lower Extremity Assessment: LLE deficits/detail;RLE deficits/detail RLE Deficits / Details: Limited by pain but ROM is functional; MMT: hip 1/5, knee 3/5, ankle 3/5 not able to test further due to orthostatic LLE Deficits / Details: ROM WFL; MMT at least 3/5 but not able to test further due to orthostatic hypotension    Cervical / Trunk Assessment Cervical / Trunk Assessment: Kyphotic  Communication        Cognition Arousal: Alert Behavior During Therapy: WFL for tasks assessed/performed   PT - Cognitive impairments: No family/caregiver present to determine baseline, Problem solving                       PT - Cognition Comments: Oriented, motivated, sometimes tangential and needs redirecting;  However, once sitting EOB pt did become lightheaded/orthostatic hypotension -during this he had a hard time focusing and following commands required assist of 2 to return to supine         Cueing       General Comments      Exercises     Assessment/Plan    PT Assessment Patient needs continued PT services  PT Problem List Decreased strength;Cardiopulmonary status limiting activity;Decreased range of motion;Decreased cognition;Decreased activity tolerance;Decreased balance;Decreased mobility;Decreased safety awareness;Decreased knowledge of use of DME       PT Treatment Interventions DME instruction;Therapeutic exercise;Gait training;Balance training;Stair training;Functional mobility training;Therapeutic  activities;Patient/family education;Modalities    PT Goals (Current goals can be found in the Care Plan section)  Acute Rehab PT Goals Patient Stated Goal: return home PT Goal Formulation: With patient Time For Goal Achievement: 10/17/23 Potential to Achieve Goals: Good    Frequency 7X/week     Co-evaluation               AM-PAC PT "6 Clicks" Mobility  Outcome Measure Help needed turning from your back to your side while in a flat bed without using bedrails?: A Little Help needed moving from lying on your back to sitting on the side of a flat bed without using bedrails?: A Little Help needed moving to and from a bed to a chair (including a wheelchair)?: Total Help needed standing up from a chair using your arms (e.g., wheelchair or bedside chair)?: Total Help needed to walk in hospital room?: Total Help needed climbing 3-5  steps with a railing? : Total 6 Click Score: 10    End of Session Equipment Utilized During Treatment: Gait belt Activity Tolerance: Treatment limited secondary to medical complications (Comment) Patient left: in bed;with call bell/phone within reach;with SCD's reapplied;with bed alarm set Nurse Communication: Mobility status;Other (comment) (orthostatic hypotension) PT Visit Diagnosis: Other abnormalities of gait and mobility (R26.89);Muscle weakness (generalized) (M62.81)    Time: [phone] (out of room for 10 mins for pt to eat) PT Time Calculation (min) (ACUTE ONLY): 43 min   Charges:   PT Evaluation $PT Eval Moderate Complexity: 1 Mod PT Treatments $Therapeutic Activity: 8-22 mins PT General Charges $$ ACUTE PT VISIT: 1 Visit         Anise Salvo, PT Acute Rehab [facility] [phone]   Rayetta Humphrey 10/03/2023, 5:43 PM

## 2023-10-03 NOTE — Anesthesia Procedure Notes (Signed)
 Procedure Name: MAC Date/Time: 10/03/2023 10:59 AM  Performed by: Orest Dikes, CRNAPre-anesthesia Checklist: Patient identified, Emergency Drugs available, Suction available and Patient being monitored Oxygen Delivery Method: Simple face mask

## 2023-10-03 NOTE — Anesthesia Postprocedure Evaluation (Addendum)
 Anesthesia Post Note  Patient: Max Cole  Procedure(s) Performed: ARTHROPLASTY, HIP, TOTAL, ANTERIOR APPROACH (Right: Hip)     Patient location during evaluation: PACU Anesthesia Type: MAC Level of consciousness: awake and alert and oriented Pain management: pain level controlled Vital Signs Assessment: post-procedure vital signs reviewed and stable Respiratory status: spontaneous breathing, nonlabored ventilation and respiratory function stable Cardiovascular status: blood pressure returned to baseline and stable Postop Assessment: no headache, no backache and spinal receding Anesthetic complications: no   No notable events documented.  Last Vitals:  Vitals:   10/03/23 1245 10/03/23 1300  BP: (!) 123/108 (!) 128/48  Pulse: 63 60  Resp: 16 16  Temp:    SpO2: 100% 100%    Last Pain:  Vitals:   10/03/23 0921  TempSrc: Oral  PainSc: 0-No pain                 Lannie Fields

## 2023-10-04 ENCOUNTER — Encounter (HOSPITAL_COMMUNITY): Payer: Self-pay | Admitting: Orthopedic Surgery

## 2023-10-04 DIAGNOSIS — Z950 Presence of cardiac pacemaker: Secondary | ICD-10-CM | POA: Diagnosis not present

## 2023-10-04 DIAGNOSIS — I5040 Unspecified combined systolic (congestive) and diastolic (congestive) heart failure: Secondary | ICD-10-CM | POA: Diagnosis not present

## 2023-10-04 DIAGNOSIS — Z7901 Long term (current) use of anticoagulants: Secondary | ICD-10-CM | POA: Diagnosis not present

## 2023-10-04 DIAGNOSIS — N183 Chronic kidney disease, stage 3 unspecified: Secondary | ICD-10-CM | POA: Diagnosis not present

## 2023-10-04 DIAGNOSIS — I2510 Atherosclerotic heart disease of native coronary artery without angina pectoris: Secondary | ICD-10-CM | POA: Diagnosis not present

## 2023-10-04 DIAGNOSIS — Z8546 Personal history of malignant neoplasm of prostate: Secondary | ICD-10-CM | POA: Diagnosis not present

## 2023-10-04 DIAGNOSIS — M1611 Unilateral primary osteoarthritis, right hip: Secondary | ICD-10-CM | POA: Diagnosis not present

## 2023-10-04 DIAGNOSIS — I482 Chronic atrial fibrillation, unspecified: Secondary | ICD-10-CM | POA: Diagnosis not present

## 2023-10-04 DIAGNOSIS — Z79899 Other long term (current) drug therapy: Secondary | ICD-10-CM | POA: Diagnosis not present

## 2023-10-04 LAB — CBC
HCT: 22.7 % — ABNORMAL LOW (ref 39.0–52.0)
Hemoglobin: 7.3 g/dL — ABNORMAL LOW (ref 13.0–17.0)
MCH: 34.9 pg — ABNORMAL HIGH (ref 26.0–34.0)
MCHC: 32.2 g/dL (ref 30.0–36.0)
MCV: 108.6 fL — ABNORMAL HIGH (ref 80.0–100.0)
Platelets: 213 10*3/uL (ref 150–400)
RBC: 2.09 MIL/uL — ABNORMAL LOW (ref 4.22–5.81)
RDW: 11.9 % (ref 11.5–15.5)
WBC: 18.1 10*3/uL — ABNORMAL HIGH (ref 4.0–10.5)
nRBC: 0 % (ref 0.0–0.2)

## 2023-10-04 LAB — BASIC METABOLIC PANEL WITH GFR
Anion gap: 8 (ref 5–15)
BUN: 51 mg/dL — ABNORMAL HIGH (ref 8–23)
CO2: 28 mmol/L (ref 22–32)
Calcium: 8.5 mg/dL — ABNORMAL LOW (ref 8.9–10.3)
Chloride: 100 mmol/L (ref 98–111)
Creatinine, Ser: 2.06 mg/dL — ABNORMAL HIGH (ref 0.61–1.24)
GFR, Estimated: 29 mL/min — ABNORMAL LOW (ref >60)
Glucose, Bld: 135 mg/dL — ABNORMAL HIGH (ref 70–99)
Potassium: 4.7 mmol/L (ref 3.5–5.1)
Sodium: 136 mmol/L (ref 135–145)

## 2023-10-04 LAB — PREPARE RBC (CROSSMATCH)

## 2023-10-04 MED ORDER — SODIUM CHLORIDE 0.9% IV SOLUTION: Freq: Once | INTRAVENOUS | Status: AC

## 2023-10-04 MED ORDER — SODIUM CHLORIDE 0.9 % IV SOLN: INTRAVENOUS | Status: DC

## 2023-10-04 MED ORDER — NAPHAZOLINE-GLYCERIN 0.012-0.25 % OP SOLN
1.0000 [drp] | Freq: Four times a day (QID) | OPHTHALMIC | Status: DC | PRN
  Administered 2023-10-04: 1 [drp] via OPHTHALMIC
  Filled 2023-10-04: qty 15

## 2023-10-04 MED ORDER — APIXABAN 2.5 MG PO TABS
2.5000 mg | ORAL_TABLET | Freq: Two times a day (BID) | ORAL | Status: DC
Start: 2023-10-05 — End: 2023-10-05
  Administered 2023-10-05: 2.5 mg via ORAL
  Filled 2023-10-04: qty 1

## 2023-10-04 NOTE — Care Management Obs Status (Signed)
 MEDICARE OBSERVATION STATUS NOTIFICATION   Patient Details  Name: Max Cole MRN: [id number] Date of Birth: December 15, 1927   Medicare Observation Status Notification Given:  Yes    Howell Rucks, RN 10/04/2023, 9:26 AM

## 2023-10-04 NOTE — Plan of Care (Signed)

## 2023-10-04 NOTE — Progress Notes (Signed)
   Subjective: 1 Day Post-Op Procedure(s) (LRB): ARTHROPLASTY, HIP, TOTAL, ANTERIOR APPROACH (Right) Patient reports pain as mild.   Patient seen in rounds for Dr. Charlann Boxer. Patient is resting in bed on exam this morning. Foley catheter removed. Patient got dizzy with PT yesterday, so he did not ambulate. He notes that he feels very weak related to his hgb. He was sitting up in bed eating banana pudding and an omelette when we talked.  We will start therapy today.   Objective: Vital signs in last 24 hours: Temp:  [97.5 F (36.4 C)-98.2 F (36.8 C)] 98.2 F (36.8 C) (04/09 4332) Pulse Rate:  [57-71] 65 (04/09 0632) Resp:  [12-19] 16 (04/09 0632) BP: (98-159)/(48-108) 131/82 (04/09 0632) SpO2:  [99 %-100 %] 100 % (04/09 9518) Weight:  [73.9 kg] 73.9 kg (04/08 0929)  Intake/Output from previous day:  Intake/Output Summary (Last 24 hours) at 10/04/2023 0845 Last data filed at 10/04/2023 0658 Gross per 24 hour  Intake 2090 ml  Output 2000 ml  Net 90 ml     Intake/Output this shift: No intake/output data recorded.  Labs: Recent Labs    10/04/23 0328  HGB 7.3*   Recent Labs    10/04/23 0328  WBC 18.1*  RBC 2.09*  HCT 22.7*  PLT 213   Recent Labs    10/04/23 0328  NA 136  K 4.7  CL 100  CO2 28  BUN 51*  CREATININE 2.06*  GLUCOSE 135*  CALCIUM 8.5*   No results for input(s): "LABPT", "INR" in the last 72 hours.  Exam: General - Patient is Alert and Oriented Extremity - Neurologically intact Sensation intact distally Intact pulses distally Dorsiflexion/Plantar flexion intact Dressing - dressing C/D/I Motor Function - intact, moving foot and toes well on exam.   Past Medical History:  Diagnosis Date   Anemia    Arthritis    Atrial fibrillation (HCC)    Bradycardia    Cardiomyopathy 06/28/2003   a. Possibly alcoholic; b. cath in 2005-50% ostial D1, PCW of 12, EF of 35-40%;  c. EF of 0.25 in 11/2003;  d. 40-45% in 05/2004;  e. 25% in 10/2008 by echo;  f. 04/2015  Echo: EF 40-45%, diff HK, sev inflat/inf HK, Gr 1 DD, mild AI, triv TR.   Carotid artery disease (HCC)    CKD (chronic kidney disease) stage 3, GFR 30-59 ml/min (HCC)    Coronary artery disease    Dyspnea    Hyperlipidemia    Left bundle branch block    Presence of permanent cardiac pacemaker    Prostate carcinoma (HCC)    Pulmonary fibrosis (HCC)    Syncope    [place] CRT-P 07/03/15 Dr. Ladona Ridgel    Assessment/Plan: 1 Day Post-Op Procedure(s) (LRB): ARTHROPLASTY, HIP, TOTAL, ANTERIOR APPROACH (Right) Principal Problem:   S/P total right hip arthroplasty  Estimated body mass index is 22.11 kg/m as calculated from the following:   Height as of this encounter: 6' (1.829 m).   Weight as of this encounter: 73.9 kg. Advance diet Up with therapy  DVT Prophylaxis -  Eliquis - will hold today while getting blood Weight bearing as tolerated.  ABLA on chronic anemia - Hgb 10.1 >7.3 this AM - will give 2 units PRBC CKD - cr 2.09 - near baseline, but also related to volume   Up with PT today as able He will stay tonight and hopefully d/c home tomorrow   Rosalene Billings, PA-C Orthopedic Surgery [phone] 10/04/2023, 8:45 AM

## 2023-10-04 NOTE — Progress Notes (Signed)
 Physical Therapy Treatment Patient Details Name: Max Cole MRN: [id number] DOB: [birth year] Today's Date: 10/04/2023   History of Present Illness Pt is 89 yo male s/p R anterior THA on 10/03/23.  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  Tolerated mobility well.  Ambulated 30'x2 with chair follow and CGA.  Pt with good pain control and motivation.  Sats do decrease with activity if pt talking, but cued to focus on breathing improved ability to maintain >90%.  Noted per MD, pt for another hospital night stay.  Expect pt to progress well tomorrow.     If plan is discharge home, recommend the following: A little help with walking and/or transfers;A little help with bathing/dressing/bathroom;Assistance with cooking/housework;Help with stairs or ramp for entrance   Can travel by private vehicle        Equipment Recommendations  None recommended by PT    Recommendations for Other Services       Precautions / Restrictions Precautions Precautions: Fall Restrictions RLE Weight Bearing Per Provider Order: Weight bearing as tolerated     Mobility      Transfers Overall transfer level: Needs assistance Equipment used: Rolling walker (2 wheels) Transfers: Sit to/from Stand Sit to Stand: Contact guard assist   Step pivot transfers: Contact guard assist, +2 safety/equipment       General transfer comment: Stood from chair x 2 and toilet x 1  Pt in chair and wanted to return to chair.  Did report was getting hard while ago - added 2 pillow under pad for pt to sit on.  Ambulation/Gait Ambulation/Gait assistance: Contact guard assist Gait Distance (Feet): 30 Feet (30'x2) Assistive device: Rolling walker (2 wheels) Gait Pattern/deviations: Trunk flexed, Step-to pattern Gait velocity: decreased      General Gait Details: Cues for RW and posture; chair follow, 1 seated rest break, cues for focus on breathing   Stairs             Wheelchair Mobility     Tilt Bed    Modified Rankin (Stroke Patients Only)       Balance Overall balance assessment: Needs assistance Sitting-balance support: No upper extremity supported Sitting balance-Leahy Scale: Good     Standing balance support: Bilateral upper extremity supported, Reliant on assistive device for balance Standing balance-Leahy Scale: Poor Standing balance comment: steady with RW                            Communication    Cognition Arousal: Alert Behavior During Therapy: WFL for tasks assessed/performed   PT - Cognitive impairments: No apparent impairments                       PT - Cognition Comments: .        Cueing    Exercises Total Joint Exercises Ankle Circles/Pumps: AROM, Both, 10 reps, Supine Quad Sets: AROM, Both, 10 reps, Supine Heel Slides: AAROM, Right, 10 reps, Supine Hip ABduction/ADduction: AAROM, Right, 10 reps, Supine Long Arc Quad: AROM, Left, AAROM, Right, 10 reps, Seated Other Exercises Other Exercises: shoulder press AROM x 10    General Comments General comments (skin integrity, edema, etc.): Pt on 2 L O2 (baseline), sats 95% rest, down to 87% walking, on next bout cued  pt to focus on breathing (no talking) and sats maintained 90%.  No lightheadedness      Pertinent Vitals/Pain Pain Assessment Pain Assessment: 0-10 Pain Score: 3  Pain Location: R hip Pain Descriptors / Indicators: Discomfort Pain Intervention(s): Limited activity within patient's tolerance, Monitored during session, Premedicated before session, Repositioned, Ice applied (reports 8/10 first few steps but then down to 3)    Home Living                          Prior Function            PT Goals (current goals can now be found in the care plan section) Progress towards PT  goals: Progressing toward goals    Frequency    7X/week      PT Plan      Co-evaluation              AM-PAC PT "6 Clicks" Mobility   Outcome Measure  Help needed turning from your back to your side while in a flat bed without using bedrails?: A Little Help needed moving from lying on your back to sitting on the side of a flat bed without using bedrails?: A Little Help needed moving to and from a bed to a chair (including a wheelchair)?: A Little Help needed standing up from a chair using your arms (e.g., wheelchair or bedside chair)?: A Little Help needed to walk in hospital room?: A Little Help needed climbing 3-5 steps with a railing? : A Little 6 Click Score: 18    End of Session Equipment Utilized During Treatment: Gait belt Activity Tolerance: Patient tolerated treatment well Patient left: with call bell/phone within reach;with chair alarm set;in chair;with family/visitor present Nurse Communication: Mobility status PT Visit Diagnosis: Other abnormalities of gait and mobility (R26.89);Muscle weakness (generalized) (M62.81)     Time: 4259-5638 PT Time Calculation (min) (ACUTE ONLY): 36 min  Charges:    $Gait Training: 8-22 mins $Therapeutic Exercise: 8-22 mins PT General Charges $$ ACUTE PT VISIT: 1 Visit                     Anise Salvo, PT Acute Rehab [facility] Re[facility] [phone]    Rayetta Humphrey 10/04/2023, 3:29 PM

## 2023-10-04 NOTE — Plan of Care (Signed)
  Problem: Pain Management: Goal: Pain level will decrease with appropriate interventions Outcome: Progressing   Problem: Activity: Goal: Ability to tolerate increased activity will improve Outcome: Progressing   Problem: Education: Goal: Knowledge of the prescribed therapeutic regimen will improve Outcome: Progressing   Problem: Safety: Goal: Ability to remain free from injury will improve Outcome: Progressing   Problem: Education: Goal: Knowledge of General Education information will improve Description: Including pain rating scale, medication(s)/side effects and non-pharmacologic comfort measures Outcome: Progressing

## 2023-10-04 NOTE — TOC Transition Note (Signed)
 Transition of Care [facility]) - Discharge Note   Patient Details  Name: Max Cole MRN: [id number] Date of Birth: [birth year]  Transition of Care [facility]) CM/SW Contact:  Howell Rucks, RN Phone Number: 10/04/2023, 9:51 AM   Clinical Narrative:   Met with pt at bedside to review dc therapy and home equipment needs, pt confirmed HEP, has RW, no home DME needs. No TOC needs.     Final next level of care: Home/Self Care Barriers to Discharge: No Barriers Identified   Patient Goals and CMS Choice Patient states their goals for this hospitalization and ongoing recovery are:: return home          Discharge Placement                       Discharge Plan and Services Additional resources added to the After Visit Summary for                  DME Arranged: N/A DME Agency: NA                  Social Drivers of Health (SDOH) Interventions SDOH Screenings   Food Insecurity: No Food Insecurity (10/03/2023)  Housing: Low Risk  (10/03/2023)  Transportation Needs: No Transportation Needs (10/03/2023)  Utilities: Not At Risk (10/03/2023)  Financial Resource Strain: Low Risk  (10/12/2022)  Physical Activity: Insufficiently Active (10/14/2022)  Social Connections: Moderately Isolated (10/03/2023)  Tobacco Use: Medium Risk (10/03/2023)     Readmission Risk Interventions    09/04/2023   11:38 AM  Readmission Risk Prevention Plan  Transportation Screening Complete  PCP or Specialist Appt within 5-7 Days Complete  Home Care Screening Complete  Medication Review (RN CM) Complete

## 2023-10-04 NOTE — Progress Notes (Signed)
 Physical Therapy Treatment Patient Details Name: Max Cole MRN: [id number] DOB: [birth year] Today's Date: 10/04/2023   History of Present Illness Pt is 89 yo male s/p R anterior THA on 10/03/23.  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  Proceeded cautiously with increasing mobility this morning.  Pt was able to tolerate OOB to chair with CGA without symptoms of lightheadedness and pain improved with activity.  Plan to f/u in afternoon for further increase in activity.  Not lightheadedness and BP stable.     If plan is discharge home, recommend the following: A little help with walking and/or transfers;A little help with bathing/dressing/bathroom;Assistance with cooking/housework;Help with stairs or ramp for entrance   Can travel by private vehicle        Equipment Recommendations  None recommended by PT    Recommendations for Other Services       Precautions / Restrictions Precautions Precautions: Fall Restrictions RLE Weight Bearing Per Provider Order: Weight bearing as tolerated     Mobility  Bed Mobility Overal bed mobility: Needs Assistance Bed Mobility: Supine to Sit     Supine to sit: Min assist     General bed mobility comments: Light min A    Transfers Overall transfer level: Needs assistance Equipment used: Rolling walker (2 wheels) Transfers: Sit to/from Stand, Bed to chair/wheelchair/BSC Sit to Stand: Contact guard assist, +2 safety/equipment   Step pivot transfers: Contact guard assist, +2 safety/equipment       General transfer comment: Had assist of 2 for safety as pt with lightheadeness, orthostatic BP yesterday and getting PRBC currently.  Pt able to perform with CGA and cues    Ambulation/Gait Ambulation/Gait assistance:  Contact guard assist, +2 safety/equipment Gait Distance (Feet): 3 Feet Assistive device: Rolling walker (2 wheels) Gait Pattern/deviations: Trunk flexed, Step-to pattern Gait velocity: decreased     General Gait Details: Cues for RW and posture; started slow this morning (only to chair) as pt getting PRBC - will progress in afternoon   Stairs             Wheelchair Mobility     Tilt Bed    Modified Rankin (Stroke Patients Only)       Balance Overall balance assessment: Needs assistance Sitting-balance support: No upper extremity supported Sitting balance-Leahy Scale: Good     Standing balance support: Bilateral upper extremity supported Standing balance-Leahy Scale: Poor Standing balance comment: steady with RW                            Communication    Cognition Arousal: Alert Behavior During Therapy: WFL for tasks assessed/performed   PT - Cognitive impairments: No apparent impairments                       PT - Cognition Comments: Pt much more alert today and able to focus.  Daughter present.  Pt noted pt anxious/chatting/difficulty focusing yesterday during transfers and with hypotension - daughter reports he panics in those situations and agrees that is his reaction        Cueing    Exercises Total Joint Exercises Ankle Circles/Pumps: AROM, Both, 10 reps, Supine Quad Sets: AROM, Both, 10 reps, Supine Long Arc Quad: AROM, Left, AAROM, Right,  10 reps, Seated Other Exercises Other Exercises: shoulder press AROM x 10    General Comments General comments (skin integrity, edema, etc.): Performed slow cautious transfers with increased time and AROM exercises for blood flow.  Pt denied any lightheadedness and BP was stable.      Pertinent Vitals/Pain Pain Assessment Pain Assessment: 0-10 Pain Score: 3  Pain Location: R hip Pain Descriptors / Indicators: Cramping Pain Intervention(s): Limited activity within patient's tolerance,  Monitored during session, Premedicated before session, Repositioned (improved with OOB)    Home Living                          Prior Function            PT Goals (current goals can now be found in the care plan section) Progress towards PT goals: Progressing toward goals    Frequency    7X/week      PT Plan      Co-evaluation              AM-PAC PT "6 Clicks" Mobility   Outcome Measure  Help needed turning from your back to your side while in a flat bed without using bedrails?: A Little Help needed moving from lying on your back to sitting on the side of a flat bed without using bedrails?: A Little Help needed moving to and from a bed to a chair (including a wheelchair)?: A Little Help needed standing up from a chair using your arms (e.g., wheelchair or bedside chair)?: A Little Help needed to walk in hospital room?: Total Help needed climbing 3-5 steps with a railing? : Total 6 Click Score: 14    End of Session Equipment Utilized During Treatment: Gait belt Activity Tolerance: Patient tolerated treatment well Patient left: with call bell/phone within reach;with chair alarm set;in chair;with family/visitor present Nurse Communication: Mobility status PT Visit Diagnosis: Other abnormalities of gait and mobility (R26.89);Muscle weakness (generalized) (M62.81)     Time: 1191-4782 PT Time Calculation (min) (ACUTE ONLY): 21 min  Charges:    $Therapeutic Activity: 8-22 mins PT General Charges $$ ACUTE PT VISIT: 1 Visit                     Anise Salvo, PT Acute Rehab [facility] [phone]    Rayetta Humphrey 10/04/2023, 1:03 PM

## 2023-10-05 DIAGNOSIS — Z79899 Other long term (current) drug therapy: Secondary | ICD-10-CM | POA: Diagnosis not present

## 2023-10-05 DIAGNOSIS — Z7901 Long term (current) use of anticoagulants: Secondary | ICD-10-CM | POA: Diagnosis not present

## 2023-10-05 DIAGNOSIS — I482 Chronic atrial fibrillation, unspecified: Secondary | ICD-10-CM | POA: Diagnosis not present

## 2023-10-05 DIAGNOSIS — M1611 Unilateral primary osteoarthritis, right hip: Secondary | ICD-10-CM | POA: Diagnosis not present

## 2023-10-05 DIAGNOSIS — I2510 Atherosclerotic heart disease of native coronary artery without angina pectoris: Secondary | ICD-10-CM | POA: Diagnosis not present

## 2023-10-05 DIAGNOSIS — Z950 Presence of cardiac pacemaker: Secondary | ICD-10-CM | POA: Diagnosis not present

## 2023-10-05 DIAGNOSIS — Z8546 Personal history of malignant neoplasm of prostate: Secondary | ICD-10-CM | POA: Diagnosis not present

## 2023-10-05 DIAGNOSIS — I5040 Unspecified combined systolic (congestive) and diastolic (congestive) heart failure: Secondary | ICD-10-CM | POA: Diagnosis not present

## 2023-10-05 DIAGNOSIS — N183 Chronic kidney disease, stage 3 unspecified: Secondary | ICD-10-CM | POA: Diagnosis not present

## 2023-10-05 LAB — TYPE AND SCREEN
ABO/RH(D): A POS
Antibody Screen: NEGATIVE
Unit division: 0
Unit division: 0

## 2023-10-05 LAB — CBC
HCT: 25.6 % — ABNORMAL LOW (ref 39.0–52.0)
Hemoglobin: 8.4 g/dL — ABNORMAL LOW (ref 13.0–17.0)
MCH: 32.3 pg (ref 26.0–34.0)
MCHC: 32.8 g/dL (ref 30.0–36.0)
MCV: 98.5 fL (ref 80.0–100.0)
Platelets: 198 10*3/uL (ref 150–400)
RBC: 2.6 MIL/uL — ABNORMAL LOW (ref 4.22–5.81)
RDW: 17.2 % — ABNORMAL HIGH (ref 11.5–15.5)
WBC: 16.4 10*3/uL — ABNORMAL HIGH (ref 4.0–10.5)
nRBC: 0 % (ref 0.0–0.2)

## 2023-10-05 LAB — BASIC METABOLIC PANEL WITH GFR
Anion gap: 7 (ref 5–15)
BUN: 53 mg/dL — ABNORMAL HIGH (ref 8–23)
CO2: 27 mmol/L (ref 22–32)
Calcium: 8.3 mg/dL — ABNORMAL LOW (ref 8.9–10.3)
Chloride: 102 mmol/L (ref 98–111)
Creatinine, Ser: 1.83 mg/dL — ABNORMAL HIGH (ref 0.61–1.24)
GFR, Estimated: 34 mL/min — ABNORMAL LOW (ref >60)
Glucose, Bld: 116 mg/dL — ABNORMAL HIGH (ref 70–99)
Potassium: 4.9 mmol/L (ref 3.5–5.1)
Sodium: 136 mmol/L (ref 135–145)

## 2023-10-05 LAB — BPAM RBC
Blood Product Expiration Date: 202505062359
Blood Product Expiration Date: 202505062359
ISSUE DATE / TIME: 202504091004
ISSUE DATE / TIME: 202504091413
Unit Type and Rh: 6200
Unit Type and Rh: 6200

## 2023-10-05 MED ORDER — SENNA 8.6 MG PO TABS: 1.0000 | ORAL_TABLET | Freq: Every day | ORAL | 0 refills | Status: AC

## 2023-10-05 MED ORDER — METHOCARBAMOL 500 MG PO TABS: 500.0000 mg | ORAL_TABLET | Freq: Four times a day (QID) | ORAL | 2 refills | Status: AC | PRN

## 2023-10-05 NOTE — Progress Notes (Signed)
   Subjective: 2 Days Post-Op Procedure(s) (LRB): ARTHROPLASTY, HIP, TOTAL, ANTERIOR APPROACH (Right) Patient reports pain as mild.   Patient seen in rounds for Dr. Charlann Boxer. Patient is resting in bed on exam this morning. No acute events overnight. Patient ambulated well with PT after getting blood.  We will continue therapy today.   Objective: Vital signs in last 24 hours: Temp:  [97.5 F (36.4 C)-98.7 F (37.1 C)] 97.9 F (36.6 C) (04/10 0619) Pulse Rate:  [60-67] 60 (04/10 0619) Resp:  [15-18] 18 (04/10 0619) BP: (118-168)/(47-79) 166/63 (04/10 0619) SpO2:  [96 %-100 %] 100 % (04/10 0619)  Intake/Output from previous day:  Intake/Output Summary (Last 24 hours) at 10/05/2023 0720 Last data filed at 10/05/2023 0639 Gross per 24 hour  Intake 2239.33 ml  Output 1000 ml  Net 1239.33 ml     Intake/Output this shift: No intake/output data recorded.  Labs: Recent Labs    10/04/23 0328 10/05/23 0318  HGB 7.3* 8.4*   Recent Labs    10/04/23 0328 10/05/23 0318  WBC 18.1* 16.4*  RBC 2.09* 2.60*  HCT 22.7* 25.6*  PLT 213 198   Recent Labs    10/04/23 0328 10/05/23 0318  NA 136 136  K 4.7 4.9  CL 100 102  CO2 28 27  BUN 51* 53*  CREATININE 2.06* 1.83*  GLUCOSE 135* 116*  CALCIUM 8.5* 8.3*   No results for input(s): "LABPT", "INR" in the last 72 hours.  Exam: General - Patient is Alert and Oriented Extremity - Neurologically intact Sensation intact distally Intact pulses distally Dorsiflexion/Plantar flexion intact Dressing - dressing C/D/I Motor Function - intact, moving foot and toes well on exam.   Past Medical History:  Diagnosis Date   Anemia    Arthritis    Atrial fibrillation (HCC)    Bradycardia    Cardiomyopathy 06/28/2003   a. Possibly alcoholic; b. cath in 2005-50% ostial D1, PCW of 12, EF of 35-40%;  c. EF of 0.25 in 11/2003;  d. 40-45% in 05/2004;  e. 25% in 10/2008 by echo;  f. 04/2015 Echo: EF 40-45%, diff HK, sev inflat/inf HK, Gr 1 DD,  mild AI, triv TR.   Carotid artery disease (HCC)    CKD (chronic kidney disease) stage 3, GFR 30-59 ml/min (HCC)    Coronary artery disease    Dyspnea    Hyperlipidemia    Left bundle branch block    Presence of permanent cardiac pacemaker    Prostate carcinoma (HCC)    Pulmonary fibrosis (HCC)    Syncope    [place] [facility]-P 07/03/15 Dr. Ladona Ridgel    Assessment/Plan: 2 Days Post-Op Procedure(s) (LRB): ARTHROPLASTY, HIP, TOTAL, ANTERIOR APPROACH (Right) Principal Problem:   S/P total right hip arthroplasty  Estimated body mass index is 22.11 kg/m as calculated from the following:   Height as of this encounter: 6' (1.829 m).   Weight as of this encounter: 73.9 kg. Advance diet Up with therapy D/C IV fluids  DVT Prophylaxis -  Eliquis to start back this morning  Weight bearing as tolerated.  Hgb stable at 8.3 after transfusion BP stable  Up with PT - home if meeting goals today   Plan is to go Home after hospital stay. Plan for discharge today after meeting goals with therapy. Follow up in the office in 2 weeks.   Rosalene Billings, PA-C Orthopedic Surgery [phone] 10/05/2023, 7:20 AM

## 2023-10-05 NOTE — Plan of Care (Signed)
  Problem: Education: Goal: Knowledge of General Education information will improve Description: Including pain rating scale, medication(s)/side effects and non-pharmacologic comfort measures Outcome: Adequate for Discharge   Problem: Health Behavior/Discharge Planning: Goal: Ability to manage health-related needs will improve Outcome: Adequate for Discharge   Problem: Clinical Measurements: Goal: Ability to maintain clinical measurements within normal limits will improve Outcome: Adequate for Discharge Goal: Will remain free from infection Outcome: Adequate for Discharge Goal: Diagnostic test results will improve Outcome: Adequate for Discharge Goal: Respiratory complications will improve Outcome: Adequate for Discharge Goal: Cardiovascular complication will be avoided Outcome: Adequate for Discharge   Problem: Activity: Goal: Risk for activity intolerance will decrease 10/05/2023 1227 by Alice Rieger A, LPN Outcome: Adequate for Discharge 10/05/2023 1022 by Alice Rieger A, LPN Outcome: Progressing   Problem: Nutrition: Goal: Adequate nutrition will be maintained Outcome: Adequate for Discharge   Problem: Coping: Goal: Level of anxiety will decrease 10/05/2023 1227 by Alice Rieger A, LPN Outcome: Adequate for Discharge 10/05/2023 1022 by Alice Rieger A, LPN Outcome: Progressing   Problem: Elimination: Goal: Will not experience complications related to bowel motility Outcome: Adequate for Discharge Goal: Will not experience complications related to urinary retention Outcome: Adequate for Discharge   Problem: Pain Managment: Goal: General experience of comfort will improve and/or be controlled 10/05/2023 1227 by Alice Rieger A, LPN Outcome: Adequate for Discharge 10/05/2023 1022 by Alice Rieger A, LPN Outcome: Progressing   Problem: Safety: Goal: Ability to remain free from injury will improve Outcome: Adequate for Discharge   Problem: Skin  Integrity: Goal: Risk for impaired skin integrity will decrease Outcome: Adequate for Discharge   Problem: Education: Goal: Knowledge of the prescribed therapeutic regimen will improve Outcome: Adequate for Discharge Goal: Understanding of discharge needs will improve Outcome: Adequate for Discharge Goal: Individualized Educational Video(s) Outcome: Adequate for Discharge   Problem: Activity: Goal: Ability to avoid complications of mobility impairment will improve Outcome: Adequate for Discharge Goal: Ability to tolerate increased activity will improve Outcome: Adequate for Discharge   Problem: Clinical Measurements: Goal: Postoperative complications will be avoided or minimized Outcome: Adequate for Discharge   Problem: Pain Management: Goal: Pain level will decrease with appropriate interventions Outcome: Adequate for Discharge   Problem: Skin Integrity: Goal: Will show signs of wound healing Outcome: Adequate for Discharge

## 2023-10-05 NOTE — Plan of Care (Signed)
  Problem: Safety: Goal: Ability to remain free from injury will improve Outcome: Progressing   Problem: Activity: Goal: Ability to tolerate increased activity will improve Outcome: Progressing   Problem: Pain Management: Goal: Pain level will decrease with appropriate interventions Outcome: Progressing   

## 2023-10-05 NOTE — Progress Notes (Signed)
 Physical Therapy Treatment Patient Details Name: Max Cole MRN: [id number] DOB: 03-09-1928 Today's Date: 10/05/2023   History of Present Illness Pt is 89 yo male s/p R anterior THA on 10/03/23.  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  Pt reports overall feeling much better today and eager to d/c home.  Pt provided with HEP handout and discussed moderation and taking rest breaks as needed.  Pt had no further questions and feels ready to return home today.     If plan is discharge home, recommend the following: A little help with walking and/or transfers;A little help with bathing/dressing/bathroom;Assistance with cooking/housework;Help with stairs or ramp for entrance   Can travel by private vehicle        Equipment Recommendations  None recommended by PT    Recommendations for Other Services       Precautions / Restrictions Precautions Precautions: Fall Precaution/Restrictions Comments: baseline 2-3 L O2 Restrictions RLE Weight Bearing Per Provider Order: Weight bearing as tolerated     Mobility  Bed Mobility Overal bed mobility: Needs Assistance Bed Mobility: Supine to Sit     Supine to sit: Contact guard     General bed mobility comments: utlized gait belt to self assist R LE    Transfers Overall transfer level: Needs assistance Equipment used: Rolling walker (2 wheels) Transfers: Sit to/from Stand Sit to Stand: Contact guard assist           General transfer comment: verbal cues for UE and LE positioning for pain control    Ambulation/Gait Ambulation/Gait assistance: Contact guard assist Gait Distance (Feet): 180 Feet Assistive device: Rolling walker (2 wheels) Gait Pattern/deviations: Trunk flexed, Step-to pattern,  Decreased stance time - right Gait velocity: decreased     General Gait Details: verbal cues for sequence, RW positioning, posture; occasional short standing rest breaks; pt requested ambulating on 3L for activity (which he does at home sometimes) and self monitored SPO2 with his thumb/wrist home monitor which remained 93-95%   Stairs Stairs: Yes Stairs assistance: Contact guard assist Stair Management: Step to pattern, Forwards, Two rails Number of Stairs: 2 General stair comments: verbal cues for sequence and safety; pt reports understanding; declined practicing more then once   Wheelchair Mobility     Tilt Bed    Modified Rankin (Stroke Patients Only)       Balance                                            Communication Communication Communication: No apparent difficulties  Cognition Arousal: Alert Behavior During Therapy: WFL for tasks assessed/performed   PT - Cognitive impairments: No apparent impairments                         Following commands: Intact      Cueing Cueing Techniques: Verbal cues  Exercises Total Joint Exercises Ankle Circles/Pumps: AROM, Both, 10 reps, Supine Quad Sets: AROM, Both, 10 reps, Supine Heel Slides: AAROM, Right, 10 reps, Supine Hip ABduction/ADduction: AAROM, Right, 10 reps, Supine, AROM, Standing Long Arc Quad: AROM, Left, Right, 10 reps, Seated Knee Flexion: AROM, Standing, Right, 10 reps Marching in  Standing: AROM, Right, 10 reps, Standing Standing Hip Extension: AROM, Right, Standing, 10 reps    General Comments        Pertinent Vitals/Pain Pain Assessment Pain Assessment: 0-10 Pain Score: 5  Pain Location: R medial thigh (occurred with use of urinal in standing and improved with ambulation) Pain Descriptors / Indicators: Burning Pain Intervention(s): Repositioned, Monitored during session    Home Living                          Prior Function            PT Goals  (current goals can now be found in the care plan section) Progress towards PT goals: Progressing toward goals    Frequency    7X/week      PT Plan      Co-evaluation              AM-PAC PT "6 Clicks" Mobility   Outcome Measure  Help needed turning from your back to your side while in a flat bed without using bedrails?: A Little Help needed moving from lying on your back to sitting on the side of a flat bed without using bedrails?: A Little Help needed moving to and from a bed to a chair (including a wheelchair)?: A Little Help needed standing up from a chair using your arms (e.g., wheelchair or bedside chair)?: A Little Help needed to walk in hospital room?: A Little Help needed climbing 3-5 steps with a railing? : A Little 6 Click Score: 18    End of Session Equipment Utilized During Treatment: Gait belt Activity Tolerance: Patient tolerated treatment well Patient left: with call bell/phone within reach;with chair alarm set;in chair Nurse Communication: Mobility status PT Visit Diagnosis: Other abnormalities of gait and mobility (R26.89);Muscle weakness (generalized) (M62.81)     Time: 1000-1035 PT Time Calculation (min) (ACUTE ONLY): 35 min  Charges:    $Gait Training: 8-22 mins $Therapeutic Exercise: 8-22 mins PT General Charges $$ ACUTE PT VISIT: 1 Visit                     Paulino Door, DPT Physical Therapist Acute Rehabilitation Services Office: [phone]    Janan Halter [place] 10/05/2023, 4:31 PM

## 2023-10-05 NOTE — Plan of Care (Signed)
   Problem: Activity: Goal: Risk for activity intolerance will decrease Outcome: Progressing   Problem: Coping: Goal: Level of anxiety will decrease Outcome: Progressing   Problem: Pain Managment: Goal: General experience of comfort will improve and/or be controlled Outcome: Progressing

## 2023-10-12 NOTE — Discharge Summary (Signed)
 Patient ID: Max Cole MRN: [id number] DOB/AGE: Oct 19, 1927 89 y.o.  Admit date: 10/03/2023 Discharge date: 10/05/2023  Admission Diagnoses:  Right hip osteoarthritis  Discharge Diagnoses:  Principal Problem:   S/P total right hip arthroplasty   Past Medical History:  Diagnosis Date   Anemia    Arthritis    Atrial fibrillation (HCC)    Bradycardia    Cardiomyopathy 06/28/2003   a. Possibly alcoholic; b. cath in 2005-50% ostial D1, PCW of 12, EF of 35-40%;  c. EF of 0.25 in 11/2003;  d. 40-45% in 05/2004;  e. 25% in 10/2008 by echo;  f. 04/2015 Echo: EF 40-45%, diff HK, sev inflat/inf HK, Gr 1 DD, mild AI, triv TR.   Carotid artery disease (HCC)    CKD (chronic kidney disease) stage 3, GFR 30-59 ml/min (HCC)    Coronary artery disease    Dyspnea    Hyperlipidemia    Left bundle branch block    Presence of permanent cardiac pacemaker    Prostate carcinoma (HCC)    Pulmonary fibrosis (HCC)    Syncope    [place] CRT-P 07/03/15 Dr. Ladona Ridgel    Surgeries: Procedure(s): ARTHROPLASTY, HIP, TOTAL, ANTERIOR APPROACH on 10/03/2023   Consultants:   Discharged Condition: Improved  Hospital Course: Max Cole is an 89 y.o. male who was admitted 10/03/2023 for operative treatment ofS/P total right hip arthroplasty. Patient has severe unremitting pain that affects sleep, daily activities, and work/hobbies. After pre-op clearance the patient was taken to the operating room on 10/03/2023 and underwent  Procedure(s): ARTHROPLASTY, HIP, TOTAL, ANTERIOR APPROACH.    Patient was given perioperative antibiotics:  Anti-infectives (From admission, onward)    Start     Dose/Rate Route Frequency Ordered Stop   10/03/23 2200  ceFAZolin (ANCEF) IVPB 2g/100 mL premix        2 g 200 mL/hr over 30 Minutes Intravenous Every 12 hours 10/03/23 1407 10/04/23 0952   10/03/23 0900  ceFAZolin (ANCEF) IVPB 2g/100 mL premix        2 g 200 mL/hr over 30 Minutes Intravenous On call to O.R. 10/03/23 1308 10/03/23  1107        Patient was given sequential compression devices, early ambulation, and chemoprophylaxis to prevent DVT. On POD #1 patient had hgb of 7.3 and underwent transfusion of 2 units PRBC. Patient worked with PT and was meeting their goals regarding safe ambulation and transfers.  Patient benefited maximally from hospital stay and there were no complications.    Recent vital signs: No data found.   Recent laboratory studies: No results for input(s): "WBC", "HGB", "HCT", "PLT", "NA", "K", "CL", "CO2", "BUN", "CREATININE", "GLUCOSE", "INR", "CALCIUM" in the last 72 hours.  Invalid input(s): "PT", "2"   Discharge Medications:   Allergies as of 10/05/2023   No Known Allergies      Medication List     TAKE these medications    acetaminophen 500 MG tablet Commonly known as: TYLENOL Take 500 mg by mouth every 4 (four) hours as needed for mild pain (pain score 1-3).   apixaban 2.5 MG Tabs tablet Commonly known as: ELIQUIS Take 1 tablet (2.5 mg total) by mouth 2 (two) times daily.   ferrous sulfate 325 (65 FE) MG tablet Take 1 tablet (325 mg total) by mouth daily with breakfast.   furosemide 40 MG tablet Commonly known as: LASIX Take 60 mg by mouth daily.   HYDROcodone-acetaminophen 7.5-325 MG tablet Commonly known as: NORCO Take 1 tablet by mouth every 4 (  four) hours as needed for severe pain (pain score 7-10).   methocarbamol 500 MG tablet Commonly known as: ROBAXIN Take 1 tablet (500 mg total) by mouth every 6 (six) hours as needed for muscle spasms.   metoprolol tartrate 50 MG tablet Commonly known as: LOPRESSOR Take 1 tablet (50 mg total) by mouth 2 (two) times daily.   OXYGEN Inhale 2 L into the lungs continuous.   polyethylene glycol powder 17 GM/SCOOP powder Commonly known as: GLYCOLAX/MIRALAX Take 17 g by mouth daily as needed for moderate constipation.   pravastatin 20 MG tablet Commonly known as: PRAVACHOL Take 1 tablet (20 mg total) by mouth every  evening.   senna 8.6 MG Tabs tablet Commonly known as: SENOKOT Take 1 tablet (8.6 mg total) by mouth at bedtime for 14 days.   tamsulosin 0.4 MG Caps capsule Commonly known as: FLOMAX Take 0.4 mg by mouth daily.   traZODone 50 MG tablet Commonly known as: DESYREL Take 50 mg by mouth at bedtime.               Discharge Care Instructions  (From admission, onward)           Start     Ordered   10/05/23 0000  Change dressing       Comments: Maintain surgical dressing until follow up in the clinic. If the edges start to pull up, may reinforce with tape. If the dressing is no longer working, may remove and cover with gauze and tape, but must keep the area dry and clean.  Call with any questions or concerns.   10/05/23 0723            Diagnostic Studies: DG Pelvis Portable Result Date: 10/03/2023 CLINICAL DATA:  Post hip arthroplasty. EXAM: PORTABLE PELVIS 1-2 VIEWS COMPARISON:  None Available. FINDINGS: Right hip arthroplasty in expected alignment. No periprosthetic lucency or fracture. Recent postsurgical change includes air and edema in the soft tissues. Overlying ice pack. IMPRESSION: Right hip arthroplasty without immediate postoperative complication. Electronically Signed   By: Narda Rutherford M.D.   On: 10/03/2023 14:46   DG HIP UNILAT WITH PELVIS 1V RIGHT Result Date: 10/03/2023 CLINICAL DATA:  Elective surgery. EXAM: DG HIP (WITH OR WITHOUT PELVIS) 1V RIGHT COMPARISON:  None Available. FINDINGS: Two fluoroscopic spot views of the pelvis and right hip obtained in the operating room. Images during hip arthroplasty. Fluoroscopy time 8 seconds. Dose 1.1728 mGy. IMPRESSION: Intraoperative fluoroscopy during right hip arthroplasty. Electronically Signed   By: Narda Rutherford M.D.   On: 10/03/2023 12:42   DG C-Arm 1-60 Min-No Report Result Date: 10/03/2023 Fluoroscopy was utilized by the requesting physician.  No radiographic interpretation.   CUP PACEART INCLINIC DEVICE  CHECK Result Date: 09/19/2023 Normal in-clinic CRT-P check. Presenting Rhythm: NSR 72. Patient in today to follow up from hospitalization (3/8-3/12) for device pocket infection post implant on 07/31/23. (See Dr. Zoe Lan OV note for details). Thresholds, sensing, impedance trend stable and within normal limits since implant.  BiV pacing 98% of the time. Estimated longevity beginning of service. Pt enrolled in remote follow-up. NOTE:  Device flagged 47 NSVT, 3 SVT, 1 VT episodes, 5 PMT since 08/23/23.  EGM's all appear AF w/RVR.  (there is some noted undersensing of AF, p waves 2.6, programmed 0.5). Sensing parameters reviewed- no changes made, will monitor.  5 PMT events today 09/19/23 appear true - device recognized and terminated. Reviewed with Joey, [employer] rep.  the following PROGRAMMING CHANGES MADE to address PMT: 1.  PVARP extended from 270 to - 2.  Max Track Rate decreased from 130 to 120 3.  Normal [place] track interval increased from to .Syliva Overman, RN   Disposition: Discharge disposition: 01-Home or Self Care       Discharge Instructions     Call MD / Call 911   Complete by: As directed    If you experience chest pain or shortness of breath, CALL 911 and be transported to the hospital emergency room.  If you develope a fever above 101 F, pus (white drainage) or increased drainage or redness at the wound, or calf pain, call your surgeon's office.   Change dressing   Complete by: As directed    Maintain surgical dressing until follow up in the clinic. If the edges start to pull up, may reinforce with tape. If the dressing is no longer working, may remove and cover with gauze and tape, but must keep the area dry and clean.  Call with any questions or concerns.   Constipation Prevention   Complete by: As directed    Drink plenty of fluids.  Prune juice may be helpful.  You may use a stool softener, such as Colace (over the counter) 100 mg twice a day.  Use MiraLax  (over the counter) for constipation as needed.   Diet - low sodium heart healthy   Complete by: As directed    Increase activity slowly as tolerated   Complete by: As directed    Weight bearing as tolerated with assist device (walker, cane, etc) as directed, use it as long as suggested by your surgeon or therapist, typically at least 4-6 weeks.   Post-operative opioid taper instructions:   Complete by: As directed    POST-OPERATIVE OPIOID TAPER INSTRUCTIONS: It is important to wean off of your opioid medication as soon as possible. If you do not need pain medication after your surgery it is ok to stop day one. Opioids include: Codeine, Hydrocodone(Norco, Vicodin), Oxycodone(Percocet, oxycontin) and hydromorphone amongst others.  Long term and even short term use of opiods can cause: Increased pain response Dependence Constipation Depression Respiratory depression And more.  Withdrawal symptoms can include Flu like symptoms Nausea, vomiting And more Techniques to manage these symptoms Hydrate well Eat regular healthy meals Stay active Use relaxation techniques(deep breathing, meditating, yoga) Do Not substitute Alcohol to help with tapering If you have been on opioids for less than two weeks and do not have pain than it is ok to stop all together.  Plan to wean off of opioids This plan should start within one week post op of your joint replacement. Maintain the same interval or time between taking each dose and first decrease the dose.  Cut the total daily intake of opioids by one tablet each day Next start to increase the time between doses. The last dose that should be eliminated is the evening dose.      TED hose   Complete by: As directed    Use stockings (TED hose) for 2 weeks on both leg(s).  You may remove them at night for sleeping.        Follow-up Information     Durene Romans, MD. Go on 10/11/2023.   Specialty: Orthopedic Surgery Why: You have a post op  appointment scheduled for Wednesday 10/11/23 at 11:15am Contact information: [address] [place] [address] [phone]  Signed: Earnie Gola 10/12/2023, 7:23 AM

## 2023-10-30 ENCOUNTER — Ambulatory Visit: Payer: Medicare HMO | Admitting: Internal Medicine

## 2023-10-30 ENCOUNTER — Ambulatory Visit: Payer: Medicare HMO

## 2023-11-02 ENCOUNTER — Ambulatory Visit (INDEPENDENT_AMBULATORY_CARE_PROVIDER_SITE_OTHER): Payer: Medicare HMO

## 2023-11-02 DIAGNOSIS — I428 Other cardiomyopathies: Secondary | ICD-10-CM

## 2023-11-06 LAB — CUP PACEART REMOTE DEVICE CHECK
Battery Remaining Longevity: 132 mo
Battery Remaining Percentage: 100 %
Brady Statistic RA Percent Paced: 21 %
Brady Statistic RV Percent Paced: 98 %
Date Time Interrogation Session: 20250509095400
Lead Channel Impedance Value: 612 Ohm
Lead Channel Impedance Value: 612 Ohm
Lead Channel Impedance Value: 652 Ohm
Lead Channel Pacing Threshold Amplitude: 0.6 V
Lead Channel Pacing Threshold Amplitude: 1 V
Lead Channel Pacing Threshold Amplitude: 1.2 V
Lead Channel Pacing Threshold Pulse Width: 0.4 ms
Lead Channel Pacing Threshold Pulse Width: 0.4 ms
Lead Channel Pacing Threshold Pulse Width: 0.6 ms
Lead Channel Setting Pacing Amplitude: 2 V
Lead Channel Setting Pacing Amplitude: 2.4 V
Lead Channel Setting Pacing Amplitude: 2.5 V
Lead Channel Setting Pacing Pulse Width: 0.4 ms
Lead Channel Setting Pacing Pulse Width: 0.6 ms
Lead Channel Setting Sensing Sensitivity: 2.5 mV
Lead Channel Setting Sensing Sensitivity: 2.5 mV
Pulse Gen Serial Number: 800611
Zone Setting Status: 755011

## 2023-11-07 ENCOUNTER — Ambulatory Visit: Payer: Self-pay | Admitting: Internal Medicine

## 2023-11-15 DIAGNOSIS — Z96641 Presence of right artificial hip joint: Secondary | ICD-10-CM | POA: Diagnosis not present

## 2023-11-15 DIAGNOSIS — Z471 Aftercare following joint replacement surgery: Secondary | ICD-10-CM | POA: Diagnosis not present

## 2023-11-28 DIAGNOSIS — M545 Low back pain, unspecified: Secondary | ICD-10-CM | POA: Diagnosis not present

## 2023-11-28 DIAGNOSIS — Z515 Encounter for palliative care: Secondary | ICD-10-CM | POA: Diagnosis not present

## 2023-11-28 DIAGNOSIS — I5042 Chronic combined systolic (congestive) and diastolic (congestive) heart failure: Secondary | ICD-10-CM | POA: Diagnosis not present

## 2023-11-28 DIAGNOSIS — R5383 Other fatigue: Secondary | ICD-10-CM | POA: Diagnosis not present

## 2023-11-28 DIAGNOSIS — Z950 Presence of cardiac pacemaker: Secondary | ICD-10-CM | POA: Diagnosis not present

## 2023-11-28 DIAGNOSIS — M25551 Pain in right hip: Secondary | ICD-10-CM | POA: Diagnosis not present

## 2023-11-28 DIAGNOSIS — Z96641 Presence of right artificial hip joint: Secondary | ICD-10-CM | POA: Diagnosis not present

## 2023-11-28 DIAGNOSIS — I4891 Unspecified atrial fibrillation: Secondary | ICD-10-CM | POA: Diagnosis not present

## 2023-12-04 ENCOUNTER — Ambulatory Visit: Payer: Medicare HMO

## 2023-12-08 NOTE — Addendum Note (Signed)
 Addended by: Lott Rouleau A on: 12/08/2023 12:10 PM   Modules accepted: Orders

## 2023-12-08 NOTE — Progress Notes (Signed)
 Remote pacemaker transmission.

## 2023-12-28 DIAGNOSIS — L570 Actinic keratosis: Secondary | ICD-10-CM | POA: Diagnosis not present

## 2023-12-28 DIAGNOSIS — Z85828 Personal history of other malignant neoplasm of skin: Secondary | ICD-10-CM | POA: Diagnosis not present

## 2023-12-28 DIAGNOSIS — L814 Other melanin hyperpigmentation: Secondary | ICD-10-CM | POA: Diagnosis not present

## 2023-12-28 DIAGNOSIS — L720 Epidermal cyst: Secondary | ICD-10-CM | POA: Diagnosis not present

## 2023-12-28 DIAGNOSIS — L821 Other seborrheic keratosis: Secondary | ICD-10-CM | POA: Diagnosis not present

## 2023-12-28 DIAGNOSIS — B078 Other viral warts: Secondary | ICD-10-CM | POA: Diagnosis not present

## 2024-01-10 DIAGNOSIS — Z96641 Presence of right artificial hip joint: Secondary | ICD-10-CM | POA: Diagnosis not present

## 2024-01-10 DIAGNOSIS — M545 Low back pain, unspecified: Secondary | ICD-10-CM | POA: Diagnosis not present

## 2024-01-12 ENCOUNTER — Telehealth: Payer: Self-pay

## 2024-01-12 NOTE — Telephone Encounter (Signed)
 LMOVM for pt to send manuel transmission. Left device clinic number for patient to call back.

## 2024-01-12 NOTE — Telephone Encounter (Signed)
 Alert received from [employer]:  Alert remote transmission:  VT Episode occurred (V>A). EGM's c/w AF with RVR, AF in progress, Eliquis  per EPIC   Undersensing in the A.  Programmed sensitivity 0.44mV.  Will contact Pt to send updated transmission.

## 2024-01-15 NOTE — Telephone Encounter (Signed)
 I spoke with the pt daughter and she agreed to send a manual transmission.

## 2024-01-16 NOTE — Telephone Encounter (Signed)
Transmission has not yet been received.

## 2024-01-17 NOTE — Telephone Encounter (Signed)
 I spoke with the pt daughter Darice. I let her know that we did not receive the transmission. She states she will try again.

## 2024-01-17 NOTE — Telephone Encounter (Signed)
 Attempted to contact patient to send remote transmission. No answer, phone went straight to VM. LMTCB.

## 2024-01-18 NOTE — Telephone Encounter (Signed)
 Patient is now back in NSR.

## 2024-02-01 ENCOUNTER — Ambulatory Visit: Payer: Medicare HMO

## 2024-02-01 DIAGNOSIS — M545 Low back pain, unspecified: Secondary | ICD-10-CM | POA: Diagnosis not present

## 2024-02-01 DIAGNOSIS — I428 Other cardiomyopathies: Secondary | ICD-10-CM

## 2024-02-02 LAB — CUP PACEART REMOTE DEVICE CHECK
Battery Remaining Longevity: 132 mo
Battery Remaining Percentage: 100 %
Brady Statistic RA Percent Paced: 25 %
Brady Statistic RV Percent Paced: 97 %
Date Time Interrogation Session: 20250807003100
Lead Channel Impedance Value: 594 Ohm
Lead Channel Impedance Value: 609 Ohm
Lead Channel Impedance Value: 613 Ohm
Lead Channel Pacing Threshold Amplitude: 0.6 V
Lead Channel Pacing Threshold Amplitude: 1 V
Lead Channel Pacing Threshold Amplitude: 1.1 V
Lead Channel Pacing Threshold Pulse Width: 0.4 ms
Lead Channel Pacing Threshold Pulse Width: 0.4 ms
Lead Channel Pacing Threshold Pulse Width: 0.6 ms
Lead Channel Setting Pacing Amplitude: 2 V
Lead Channel Setting Pacing Amplitude: 2.5 V
Lead Channel Setting Pacing Amplitude: 2.6 V
Lead Channel Setting Pacing Pulse Width: 0.4 ms
Lead Channel Setting Pacing Pulse Width: 0.6 ms
Lead Channel Setting Sensing Sensitivity: 2.5 mV
Lead Channel Setting Sensing Sensitivity: 2.5 mV
Pulse Gen Serial Number: 800611
Zone Setting Status: 755011

## 2024-02-03 ENCOUNTER — Ambulatory Visit: Payer: Self-pay | Admitting: Internal Medicine

## 2024-02-19 DIAGNOSIS — M545 Low back pain, unspecified: Secondary | ICD-10-CM | POA: Diagnosis not present

## 2024-03-04 ENCOUNTER — Ambulatory Visit: Payer: Medicare HMO

## 2024-03-14 DIAGNOSIS — C44722 Squamous cell carcinoma of skin of right lower limb, including hip: Secondary | ICD-10-CM | POA: Diagnosis not present

## 2024-03-14 DIAGNOSIS — D485 Neoplasm of uncertain behavior of skin: Secondary | ICD-10-CM | POA: Diagnosis not present

## 2024-03-14 DIAGNOSIS — L720 Epidermal cyst: Secondary | ICD-10-CM | POA: Diagnosis not present

## 2024-03-14 DIAGNOSIS — D4819 Other specified neoplasm of uncertain behavior of connective and other soft tissue: Secondary | ICD-10-CM | POA: Diagnosis not present

## 2024-03-14 DIAGNOSIS — Z85828 Personal history of other malignant neoplasm of skin: Secondary | ICD-10-CM | POA: Diagnosis not present

## 2024-03-23 NOTE — Progress Notes (Signed)
 Remote PPM Transmission

## 2024-04-30 LAB — CUP PACEART REMOTE DEVICE CHECK
Battery Remaining Longevity: 126 mo
Battery Remaining Percentage: 100 %
Brady Statistic RA Percent Paced: 29 %
Brady Statistic RV Percent Paced: 97 %
Date Time Interrogation Session: 20251104013500
Implantable Lead Connection Status: 753985
Implantable Lead Connection Status: 753985
Implantable Lead Connection Status: 753985
Implantable Lead Implant Date: 20170106
Implantable Lead Implant Date: 20170106
Implantable Lead Implant Date: 20170106
Implantable Lead Location: 753858
Implantable Lead Location: 753859
Implantable Lead Location: 753860
Implantable Lead Model: 4677
Implantable Lead Model: 7741
Implantable Lead Model: 7742
Implantable Lead Serial Number: 507382
Implantable Lead Serial Number: 695539
Implantable Lead Serial Number: 724660
Implantable Pulse Generator Implant Date: 20250203
Lead Channel Impedance Value: 583 Ohm
Lead Channel Impedance Value: 608 Ohm
Lead Channel Impedance Value: 624 Ohm
Lead Channel Pacing Threshold Amplitude: 0.7 V
Lead Channel Pacing Threshold Amplitude: 1 V
Lead Channel Pacing Threshold Amplitude: 1.2 V
Lead Channel Pacing Threshold Pulse Width: 0.4 ms
Lead Channel Pacing Threshold Pulse Width: 0.4 ms
Lead Channel Pacing Threshold Pulse Width: 0.6 ms
Lead Channel Setting Pacing Amplitude: 2 V
Lead Channel Setting Pacing Amplitude: 2.4 V
Lead Channel Setting Pacing Amplitude: 2.5 V
Lead Channel Setting Pacing Pulse Width: 0.4 ms
Lead Channel Setting Pacing Pulse Width: 0.6 ms
Lead Channel Setting Sensing Sensitivity: 2.5 mV
Lead Channel Setting Sensing Sensitivity: 2.5 mV
Pulse Gen Serial Number: 800611
Zone Setting Status: 755011

## 2024-05-02 ENCOUNTER — Ambulatory Visit (INDEPENDENT_AMBULATORY_CARE_PROVIDER_SITE_OTHER): Payer: Medicare HMO

## 2024-05-02 DIAGNOSIS — I4729 Other ventricular tachycardia: Secondary | ICD-10-CM | POA: Diagnosis not present

## 2024-05-02 NOTE — Progress Notes (Signed)
 Remote PPM Transmission

## 2024-05-03 ENCOUNTER — Ambulatory Visit: Payer: Self-pay | Admitting: Internal Medicine

## 2024-05-16 ENCOUNTER — Ambulatory Visit: Payer: Self-pay | Admitting: Family Medicine

## 2024-05-16 ENCOUNTER — Ambulatory Visit
Admission: EM | Admit: 2024-05-16 | Discharge: 2024-05-16 | Disposition: A | Discharge status: Home / Self Care | Attending: Family Medicine | Admitting: Family Medicine

## 2024-05-16 ENCOUNTER — Ambulatory Visit (INDEPENDENT_AMBULATORY_CARE_PROVIDER_SITE_OTHER)

## 2024-05-16 DIAGNOSIS — J069 Acute upper respiratory infection, unspecified: Secondary | ICD-10-CM | POA: Diagnosis not present

## 2024-05-16 DIAGNOSIS — H6123 Impacted cerumen, bilateral: Secondary | ICD-10-CM | POA: Diagnosis not present

## 2024-05-16 DIAGNOSIS — R0902 Hypoxemia: Secondary | ICD-10-CM | POA: Diagnosis not present

## 2024-05-16 DIAGNOSIS — J8410 Pulmonary fibrosis, unspecified: Secondary | ICD-10-CM | POA: Diagnosis not present

## 2024-05-16 DIAGNOSIS — R051 Acute cough: Secondary | ICD-10-CM

## 2024-05-16 DIAGNOSIS — R059 Cough, unspecified: Secondary | ICD-10-CM | POA: Diagnosis not present

## 2024-05-16 DIAGNOSIS — R0602 Shortness of breath: Secondary | ICD-10-CM | POA: Diagnosis not present

## 2024-05-16 DIAGNOSIS — R918 Other nonspecific abnormal finding of lung field: Secondary | ICD-10-CM | POA: Diagnosis not present

## 2024-05-16 MED ORDER — AMOXICILLIN-POT CLAVULANATE 875-125 MG PO TABS: 1.0000 | ORAL_TABLET | Freq: Two times a day (BID) | ORAL | 0 refills | Status: AC

## 2024-05-16 MED ORDER — BENZONATATE 200 MG PO CAPS: 200.0000 mg | ORAL_CAPSULE | Freq: Three times a day (TID) | ORAL | 0 refills | Status: AC | PRN

## 2024-05-16 MED ORDER — AZELASTINE HCL 0.1 % NA SOLN: 1.0000 | Freq: Two times a day (BID) | NASAL | 0 refills | Status: AC

## 2024-05-16 MED ORDER — CARBAMIDE PEROXIDE 6.5 % OT SOLN: 5.0000 [drp] | Freq: Two times a day (BID) | OTIC | 0 refills | Status: AC

## 2024-05-16 NOTE — ED Notes (Signed)
 Pt increased his oxygen  to 3L after initial Sat was in the low 80's.  Sat up to 91-92%.  Pt in no acute distress.

## 2024-05-16 NOTE — ED Provider Notes (Signed)
 [facility]    CSN: [id number] Arrival date & time: 05/16/24  1032      History   Chief Complaint Chief Complaint  Patient presents with   Cough    HPI Max Cole is a 89 y.o. male.   Patient presenting today with 2-day history of fever, congestion, cough, chest tightness, shortness of breath.  Complicated past medical history to include atrial fibrillation, cardiomyopathy, chronic kidney disease, coronary artery disease, history of pneumonia, pulmonary fibrosis currently on continuous O2 via nasal cannula.  States he typically maintains oxygen  saturations above 90% well on 2 L but the past 12 hours or so has needed to bump up to 3 L at times.  Try Mucinex  with minimal relief.    Past Medical History:  Diagnosis Date   Anemia    Arthritis    Atrial fibrillation (HCC)    Bradycardia    Cardiomyopathy 06/28/2003   a. Possibly alcoholic; b. cath in 2005-50% ostial D1, PCW of 12, EF of 35-40%;  c. EF of 0.25 in 11/2003;  d. 40-45% in 05/2004;  e. 25% in 10/2008 by echo;  f. 04/2015 Echo: EF 40-45%, diff HK, sev inflat/inf HK, Gr 1 DD, mild AI, triv TR.   Carotid artery disease    CKD (chronic kidney disease) stage 3, GFR 30-59 ml/min (HCC)    Coronary artery disease    Dyspnea    Hyperlipidemia    Left bundle branch block    Presence of permanent cardiac pacemaker    Prostate carcinoma (HCC)    Pulmonary fibrosis (HCC)    Syncope    [place] CRT-P 07/03/15 Dr. Waddell    Patient Active Problem List   Diagnosis Date Noted   S/P total right hip arthroplasty 10/03/2023   Infected pacemaker, initial encounter 09/02/2023   Chronic respiratory failure with hypoxia (HCC) 10/20/2021   Anemia 12/03/2020   Personal history of noncompliance with medical treatment, presenting hazards to health 12/03/2020   Aortic atherosclerosis 06/04/2020   BPH without urinary obstruction 06/04/2020   Anemia due to stage 3 chronic kidney disease (HCC) 06/04/2020   History of prostate  cancer 06/04/2020   Chronic constipation 06/04/2020   Lobar pneumonia 05/23/2020   Respiratory failure with hypoxia (HCC) 05/22/2020   Chronic a-fib (HCC) 05/22/2020   Systolic and diastolic CHF, chronic (HCC) 02/06/2020   Pacemaker 07/03/2015   Syncope 05/20/2015   Nonischemic cardiomyopathy (HCC) 05/20/2015   Carotid stenosis, bilateral 11/19/2013   Hyperlipidemia 10/15/2011   Cerebrovascular disease    Left bundle branch block    Cardiomyopathy, nonischemic (HCC)    CKD (chronic kidney disease) stage 3, GFR 30-59 ml/min (HCC)     Past Surgical History:  Procedure Laterality Date   CATARACT EXTRACTION     Right   COLONOSCOPY N/A 08/16/2012   Procedure: COLONOSCOPY;  Surgeon: Claudis RAYMOND Rivet, MD;  Location: [facility];  Service: Endoscopy;  Laterality: N/A;  930   COLONOSCOPY N/A 10/21/2015   Procedure: COLONOSCOPY;  Surgeon: Claudis RAYMOND Rivet, MD;  Location: [facility];  Service: Endoscopy;  Laterality: N/A;  1200   COLONOSCOPY W/ POLYPECTOMY  2010   Diverticulosis   EP IMPLANTABLE DEVICE N/A 07/03/2015   Procedure: BiV Pacemaker Insertion CRT-P;  Surgeon: Danelle LELON Waddell, MD;  Location: [facility] INVASIVE CV LAB;  Service: Cardiovascular;  Laterality: N/A;   EYE SURGERY Right    Cataract   HERNIA REPAIR     PPM GENERATOR CHANGEOUT N/A 07/31/2023   Procedure: PPM  GENERATOR CHANGEOUT;  Surgeon: Waddell Danelle ORN, MD;  Location: [facility] INVASIVE CV LAB;  Service: Cardiovascular;  Laterality: N/A;   TOTAL HIP ARTHROPLASTY Right 10/03/2023   Procedure: ARTHROPLASTY, HIP, TOTAL, ANTERIOR APPROACH;  Surgeon: Ernie Cough, MD;  Location: [facility];  Service: Orthopedics;  Laterality: Right;       Home Medications    Prior to Admission medications   Medication Sig Start Date End Date Taking? Authorizing Provider  amoxicillin-clavulanate (AUGMENTIN) 875-125 MG tablet Take 1 tablet by mouth every 12 (twelve) hours. 05/16/24  Yes Stuart Vernell Norris, PA-C  azelastine (ASTELIN) 0.1 % nasal spray  Place 1 spray into both nostrils 2 (two) times daily. Use in each nostril as directed 05/16/24  Yes Stuart Vernell Norris, PA-C  benzonatate (TESSALON) 200 MG capsule Take 1 capsule (200 mg total) by mouth 3 (three) times daily as needed for cough. 05/16/24  Yes Stuart Vernell Norris, PA-C  carbamide peroxide (DEBROX) 6.5 % OTIC solution Place 5 drops into both ears 2 (two) times daily. 05/16/24  Yes Stuart Vernell Norris, PA-C  acetaminophen  (TYLENOL ) 500 MG tablet Take 500 mg by mouth every 4 (four) hours as needed for mild pain (pain score 1-3).    [provider]  apixaban  (ELIQUIS ) 2.5 MG TABS tablet Take 1 tablet (2.5 mg total) by mouth 2 (two) times daily. 06/10/20   Landy Barnie RAMAN, NP  ferrous sulfate  325 (65 FE) MG tablet Take 1 tablet (325 mg total) by mouth daily with breakfast. 06/10/20   Landy Barnie RAMAN, NP  furosemide  (LASIX ) 40 MG tablet Take 60 mg by mouth daily.    [provider]  HYDROcodone -acetaminophen  (NORCO) 7.5-325 MG tablet Take 1 tablet by mouth every 4 (four) hours as needed for severe pain (pain score 7-10).    [provider]  methocarbamol  (ROBAXIN ) 500 MG tablet Take 1 tablet (500 mg total) by mouth every 6 (six) hours as needed for muscle spasms. 10/05/23   Patti Rosina SAUNDERS, PA-C  metoprolol  tartrate (LOPRESSOR ) 50 MG tablet Take 1 tablet (50 mg total) by mouth 2 (two) times daily. 08/23/23   Leverne Charlies Helling, PA-C  OXYGEN  Inhale 2 L into the lungs continuous. 06/04/20   [provider]  polyethylene glycol powder (GLYCOLAX /MIRALAX ) 17 GM/SCOOP powder Take 17 g by mouth daily as needed for moderate constipation.    [provider]  pravastatin  (PRAVACHOL ) 20 MG tablet Take 1 tablet (20 mg total) by mouth every evening. 06/10/20   Landy Barnie RAMAN, NP  tamsulosin  (FLOMAX ) 0.4 MG CAPS capsule Take 0.4 mg by mouth daily. 03/28/22   [provider]  traZODone  (DESYREL ) 50 MG tablet Take 50 mg by mouth at bedtime.  07/17/23   [provider]    Family History Family History  Problem Relation Age of Onset   Hypertension Mother     Social History Social History   Tobacco Use   Smoking status: Former    Current packs/day: 0.00    Average packs/day: 1 pack/day for 30.0 years (30.0 ttl pk-yrs)    Types: Cigarettes    Start date: 09/29/1943    Quit date: 09/28/1973    Years since quitting: 50.6   Smokeless tobacco: Never  Vaping Use   Vaping status: Never Used  Substance Use Topics   Alcohol  use: Not Currently    Alcohol /week: 5.0 standard drinks of alcohol     Types: 5 Glasses of wine per week    Comment: History of excessive alcohol  use; Alternates between  glass of wine vs. Liquor drink 5 days per week (04/2015)   Drug use: No     Allergies   Patient has no known allergies.   Review of Systems Review of Systems PER HPI  Physical Exam Triage Vital Signs ED Triage Vitals  Encounter Vitals Group     BP 05/16/24 1045 110/62     Girls Systolic BP Percentile --      Girls Diastolic BP Percentile --      Boys Systolic BP Percentile --      Boys Diastolic BP Percentile --      Pulse Rate 05/16/24 1045 70     Resp 05/16/24 1045 18     Temp 05/16/24 1045 98.4 F (36.9 C)     Temp Source 05/16/24 1045 Oral     SpO2 05/16/24 1045 (!) 88 %     Weight --      Height --      Head Circumference --      Peak Flow --      Pain Score 05/16/24 1044 0     Pain Loc --      Pain Education --      Exclude from Growth Chart --    No data found.  Updated Vital Signs BP 110/62 (BP Location: Right Arm)   Pulse 70   Temp 98.4 F (36.9 C) (Oral)   Resp 18   SpO2 (!) 88%   Visual Acuity Right Eye Distance:   Left Eye Distance:   Bilateral Distance:    Right Eye Near:   Left Eye Near:    Bilateral Near:     Physical Exam Vitals and nursing note reviewed.  Constitutional:      Appearance: He is well-developed.  HENT:     Head: Atraumatic.     Right Ear: There is impacted  cerumen.     Left Ear: There is impacted cerumen.     Nose: Rhinorrhea present.     Mouth/Throat:     Mouth: Mucous membranes are moist.     Pharynx: Posterior oropharyngeal erythema present. No oropharyngeal exudate.  Eyes:     Conjunctiva/sclera: Conjunctivae normal.     Pupils: Pupils are equal, round, and reactive to light.  Cardiovascular:     Rate and Rhythm: Normal rate and regular rhythm.  Pulmonary:     Effort: Pulmonary effort is normal. No respiratory distress.     Comments: Diffuse fibrotic sounds Musculoskeletal:        General: Normal range of motion.     Cervical back: Normal range of motion and neck supple.  Lymphadenopathy:     Cervical: No cervical adenopathy.  Skin:    General: Skin is warm and dry.  Neurological:     Mental Status: He is alert and oriented to person, place, and time.  Psychiatric:        Behavior: Behavior normal.      UC Treatments / Results  Labs (all labs ordered are listed, but only abnormal results are displayed) Labs Reviewed - No data to display  EKG   Radiology DG Chest 2 View Result Date: 05/16/2024 EXAM: 2 VIEW(S) XRAY OF THE CHEST 05/16/2024 11:13:59 AM COMPARISON: Chest x-ray 09/02/2023, 12/23/2021, 07/03/2020. CLINICAL HISTORY: cough, SOB, hypoxia FINDINGS: LINES, TUBES AND DEVICES: Dual lead left-sided pacemaker unchanged. LUNGS AND PLEURA: Lungs are hypoinflated with chronic interstitial prominence of the mid to lower lungs. Mild opacification over the lateral right mid lung. No pleural effusion. No pneumothorax. HEART  AND MEDIASTINUM: Dual lead left-sided pacemaker unchanged. Cardiomediastinal silhouette. BONES AND SOFT TISSUES: Moderate compression fracture over the mid thoracic spine, new since 2023, although age indeterminate. Stable moderate compression fracture over the lower thoracic spine. Moderate compression fracture over the region of the thoracolumbar junction, not seen on the previous exam from 2023. IMPRESSION:  1. Hypoinflated lungs with chronic interstitial prominence of the mid to lower lungs. Mild opacification over the lateral right mid lung which may be due to acute infection. 2. Moderate compression fracture over the mid thoracic spine, new since 2023, age indeterminate. 3. Moderate compression fracture over the region of the thoracolumbar junction not seen on the 2023 exam. Electronically signed by: Toribio Agreste MD 05/16/2024 12:01 PM EST RP Workstation: [id number]    Procedures Procedures (including critical care time)  Medications Ordered in UC Medications - No data to display  Initial Impression / Assessment and Plan / UC Course  I have reviewed the triage vital signs and the nursing notes.  Pertinent labs & imaging results that were available during my care of the patient were reviewed by me and considered in my medical decision making (see chart for details).     Oxygen  saturations ranging from 88% on 2 L continuous O2 via nasal cannula to 92% on 3 L nasal cannula.  He is well-appearing, in no acute distress and speaking in full sentences throughout time in department.  Given his history of pulmonary fibrosis as well as possible left-sided opacity noted on chest x-ray today, will cover with Augmentin as well as Tessalon, Astelin, supportive over-the-counter medications and home care.  He was noted to have bilateral ear impactions on exam, Debrox drops, warm water  lavage at home.  Return for worsening or unresolving symptoms.  Final Clinical Impressions(s) / UC Diagnoses   Final diagnoses:  Acute cough  Acute upper respiratory infection  Pulmonary fibrosis (HCC)  Bilateral impacted cerumen     Discharge Instructions      We will let you know when the radiologist sends your x-ray report back if anything new is shown.  Because of your history of pulmonary fibrosis, I have covered with an antibiotic to hopefully keep this from going into pneumonia.  I do suspect you have caught a  viral infection but again with your history I would rather be more on the aggressive side.  I have also sent in a nasal spray and Tessalon Perles to help with your symptoms.  You may take Coricidin HBP and plain Mucinex  as needed.  For your impacted ears, I have sent in a wax softening drops and you may do home lavage with a bulb syringe or the shower water .  Follow-up if the wax does not clear    ED Prescriptions     Medication Sig Dispense Auth. Provider   amoxicillin-clavulanate (AUGMENTIN) 875-125 MG tablet Take 1 tablet by mouth every 12 (twelve) hours. 14 tablet Stuart Vernell Norris, PA-C   benzonatate (TESSALON) 200 MG capsule Take 1 capsule (200 mg total) by mouth 3 (three) times daily as needed for cough. 20 capsule Stuart Vernell Norris, PA-C   azelastine (ASTELIN) 0.1 % nasal spray Place 1 spray into both nostrils 2 (two) times daily. Use in each nostril as directed 30 mL Stuart Vernell Norris, PA-C   carbamide peroxide (DEBROX) 6.5 % OTIC solution Place 5 drops into both ears 2 (two) times daily. 15 mL Stuart Vernell Norris, [place]      PDMP not reviewed this encounter.   Stuart Vernell Norris,  PA-C 05/16/24 1906

## 2024-05-16 NOTE — ED Triage Notes (Signed)
 Pt states cough and congestion since yesterday.  Pt on home O2.

## 2024-05-16 NOTE — Discharge Instructions (Signed)
 We will let you know when the radiologist sends your x-ray report back if anything new is shown.  Because of your history of pulmonary fibrosis, I have covered with an antibiotic to hopefully keep this from going into pneumonia.  I do suspect you have caught a viral infection but again with your history I would rather be more on the aggressive side.  I have also sent in a nasal spray and Tessalon Perles to help with your symptoms.  You may take Coricidin HBP and plain Mucinex  as needed.  For your impacted ears, I have sent in a wax softening drops and you may do home lavage with a bulb syringe or the shower water .  Follow-up if the wax does not clear

## 2024-06-03 ENCOUNTER — Ambulatory Visit: Payer: Medicare HMO

## 2024-07-10 ENCOUNTER — Encounter: Payer: Self-pay | Admitting: Internal Medicine

## 2024-07-10 ENCOUNTER — Ambulatory Visit: Admitting: Internal Medicine

## 2024-07-10 VITALS — BP 154/78 | HR 70 | Ht 72.0 in | Wt 180.0 lb

## 2024-07-10 DIAGNOSIS — J9611 Chronic respiratory failure with hypoxia: Secondary | ICD-10-CM | POA: Diagnosis not present

## 2024-07-10 DIAGNOSIS — Z87891 Personal history of nicotine dependence: Secondary | ICD-10-CM

## 2024-07-10 DIAGNOSIS — J8410 Pulmonary fibrosis, unspecified: Secondary | ICD-10-CM | POA: Insufficient documentation

## 2024-07-10 NOTE — Patient Instructions (Addendum)
 Make sure you check your oxygen  saturation  AT  your highest level of activity (not after you stop)   to be sure it stays over 90% and adjust  02 flow upward to maintain this level if needed but remember to turn it back to previous settings when you stop (to conserve your supply).   We are referring you to [facility] for best fit for portable 02   Please remember to go to the lab department   for your tests - we will call you with the results when they are available.  Please schedule a follow up visit in 6 months but call sooner if needed

## 2024-07-10 NOTE — Assessment & Plan Note (Addendum)
 Off amiodarone  05/2020  - ESR/cbc  pending   I strongly doubt there's a steroid responsive component at this point but worth a long shot to find out  - also need r/o anemia   Main issue in improving activity level to prevent further deconditioning and osteo so needs to do more wt bearing ex if tol

## 2024-07-10 NOTE — Assessment & Plan Note (Addendum)
 07/10/2024   Walked on 6lpm POC   x  2  lap(s) =  approx 300  ft  @ slow to mod pace, stopped due to sob  with lowest 02 sats 86% so referred for BEST fit for portable 02     Each maintenance medication was reviewed in detail including emphasizing most importantly the difference between maintenance and prns and under what circumstances the prns are to be triggered using an action plan format where appropriate.  Total time for H and P, chart review, counseling,  directly observing portions of ambulatory 02 saturation study/ and generating customized AVS unique to this office visit / same day charting = 45 min with pt not seen by me in 4 years with  refractory respiratory  symptoms

## 2024-07-10 NOTE — Progress Notes (Signed)
 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 Some abn spots on cxr    Dyspnea:  bicycle x 1 hour / treadmill also some/ limited by back pain from many activities more so than breathing Cough: some mucoid production  in am's / clears p sev coughs  Sleep: bed bed one pillow  SABA use: none  02 : POC daytime up to 3lpm / 2lpm concentrator at hs    No obvious day to day or daytime pattern/variability or assoc excess/ purulent sputum or mucus plugs or hemoptysis or cp or chest tightness, subjective wheeze or overt sinus or hb symptoms.    Also denies any obvious fluctuation of symptoms with weather or environmental changes or other aggravating or alleviating factors except as outlined above   No unusual exposure hx or h/o childhood pna/ asthma or knowledge of premature birth.  Current Allergies, Complete Past Medical History, Past Surgical History, Family History, and Social History were reviewed in [employer] record.  ROS  The following are not active complaints unless bolded Hoarseness, sore throat, dysphagia, dental problems, itching, sneezing,  nasal congestion or discharge of excess mucus or purulent secretions, ear ache,   fever, chills, sweats, unintended wt loss or wt gain, classically pleuritic or exertional cp,  orthopnea pnd or arm/hand swelling  or leg swelling, presyncope, palpitations, abdominal pain, anorexia, nausea, vomiting, diarrhea  or change in bowel habits or change in bladder  habits, change in stools or change in urine, dysuria, hematuria,  rash, arthralgias/chronic back pain, visual complaints, headache, numbness, weakness or ataxia or problems with walking or coordination,  change in mood or  memory.            Outpatient Medications Prior to Visit  Medication Sig Dispense Refill   acetaminophen  (TYLENOL ) 500 MG tablet Take 500 mg by mouth every 4 (four) hours as needed for mild pain (pain score 1-3).     apixaban  (ELIQUIS ) 2.5 MG TABS tablet Take 1 tablet (2.5 mg total) by mouth 2 (two) times daily. 60 tablet 0   ferrous sulfate  325 (65 FE) MG tablet Take 1 tablet (325 mg total) by mouth daily with breakfast. 30 tablet 0   furosemide  (LASIX ) 40 MG tablet Take 60 mg by mouth daily.     methocarbamol  (ROBAXIN ) 500 MG tablet Take 1 tablet (500 mg total) by mouth every 6 (six) hours as needed for muscle spasms. 40 tablet 2   metoprolol  tartrate (LOPRESSOR ) 50 MG tablet Take 1 tablet (50 mg total) by mouth 2 (two) times daily. 180 tablet 3   OXYGEN  Inhale 2 L into the lungs continuous.     polyethylene glycol powder (GLYCOLAX /MIRALAX ) 17 GM/SCOOP powder Take 17 g by mouth daily as needed for moderate constipation.     pravastatin  (PRAVACHOL ) 20 MG tablet Take 1 tablet (20 mg total) by mouth every evening. 30 tablet 0   tamsulosin  (FLOMAX ) 0.4 MG  CAPS capsule Take 0.4 mg by mouth daily.     amoxicillin -clavulanate (AUGMENTIN ) 875-125 MG tablet Take 1 tablet by mouth every 12 (twelve) hours. (Patient not taking: Reported on 07/10/2024) 14 tablet 0   azelastine  (ASTELIN ) 0.1 % nasal spray Place 1 spray into both nostrils 2 (two) times daily. Use in each nostril as directed (Patient not taking: Reported on 07/10/2024) 30 mL 0   benzonatate  (TESSALON ) 200 MG capsule Take 1 capsule (200 mg total) by mouth 3 (three) times daily as needed for cough. (Patient not taking: Reported on 07/10/2024) 20 capsule 0   carbamide peroxide (DEBROX) 6.5 % OTIC solution Place 5 drops into both  ears 2 (two) times daily. (Patient not taking: Reported on 07/10/2024) 15 mL 0   HYDROcodone -acetaminophen  (NORCO) 7.5-325 MG tablet Take 1 tablet by mouth every 4 (four) hours as needed for severe pain (pain score 7-10). (Patient not taking: Reported on 07/10/2024)     traZODone  (DESYREL ) 50 MG tablet Take 50 mg by mouth at bedtime. (Patient not taking: Reported on 07/10/2024)     No facility-administered medications prior to visit.    Past Medical History:  Diagnosis Date   Anemia    Arthritis    Atrial fibrillation (HCC)    Bradycardia    Cardiomyopathy 06/28/2003   a. Possibly alcoholic; b. cath in 2005-50% ostial D1, PCW of 12, EF of 35-40%;  c. EF of 0.25 in 11/2003;  d. 40-45% in 05/2004;  e. 25% in 10/2008 by echo;  f. 04/2015 Echo: EF 40-45%, diff HK, sev inflat/inf HK, Gr 1 DD, mild AI, triv TR.   Carotid artery disease    CKD (chronic kidney disease) stage 3, GFR 30-59 ml/min (HCC)    Coronary artery disease    Dyspnea    Hyperlipidemia    Left bundle branch block    Presence of permanent cardiac pacemaker    Prostate carcinoma (HCC)    Pulmonary fibrosis (HCC)    Syncope    [place] [facility]-P 07/03/15 Dr. Waddell      Objective:     BP (!) 154/78   Pulse 70   Ht 6' (1.829 m)   Wt 180 lb (81.6 kg)   SpO2 (!) 84% Comment: 2 l poc // 98% at rest  BMI 24.41 kg/m   SpO2: (!) 84 % (2 l poc // 98% at rest) amb eldelry wm nad    HEENT : Oropharynx  clear      NECK :  without  apparent JVD/ palpable Nodes/TM    LUNGS: no acc muscle use,  Nl contour chest which is clear to A and P bilaterally without cough on insp or exp maneuvers   CV:  RRR  no s3 or murmur or increase in P2, and no edema   ABD:  soft and nontender   MS:  Gait nl   ext warm without deformities Or obvious joint restrictions  calf tenderness, cyanosis or clubbing    SKIN: warm and dry without lesions    NEURO:  alert, approp, nl sensorium with  no motor or cerebellar deficits apparent.     I  personally reviewed images and agree with radiology impression as follows:  CXR:   pa and lateral 05/16/24  1. Hypoinflated lungs with chronic interstitial prominence of the mid to lower lungs. Mild opacification over the lateral right mid lung which may be due to acute infection. 2. Moderate compression fracture over the mid thoracic spine, new since 2023, age indeterminate. 3.  Moderate compression fracture over the region of the thoracolumbar junction not seen on the 2023 exam.    Assessment   Assessment & Plan Postinflammatory pulmonary fibrosis (HCC) Off amiodarone  05/2020  - ESR/cbc  pending   I strongly doubt there's a steroid responsive component at this point but worth a long shot to find out  - also need r/o anemia   Main issue in improving activity level to prevent further deconditioning and osteo so needs to do more wt bearing ex if tol   Chronic respiratory failure with hypoxia (HCC) 07/10/2024   Walked on 6lpm POC   x  2  lap(s) =  approx 300  ft  @ slow to mod pace, stopped due to sob  with lowest 02 sats 86% so referred for BEST fit for portable 02     Each maintenance medication was reviewed in detail including emphasizing most importantly the difference between maintenance and prns and under what circumstances the prns are to be triggered using an action plan format where appropriate.  Total time for H and P, chart review, counseling,  directly observing portions of ambulatory 02 saturation study/ and generating customized AVS unique to this office visit / same day charting = 45 min with pt not seen by me in 4 years with  refractory respiratory  symptoms                   AVS  Patient Instructions  Make sure you check your oxygen  saturation  AT  your highest level of activity (not after you stop)   to be sure it stays over 90% and adjust  02 flow upward to maintain this level if needed but remember to turn it back to previous settings when you stop (to conserve  your supply).   We are referring you to [facility] for best fit for portable 02   Please remember to go to the lab department   for your tests - we will call you with the results when they are available.  Please schedule a follow up visit in 6 months but call sooner if needed                  Ozell America, MD 07/10/2024     "

## 2024-07-11 ENCOUNTER — Ambulatory Visit: Payer: Self-pay | Admitting: Internal Medicine

## 2024-07-11 LAB — CBC WITH DIFFERENTIAL/PLATELET
Basophils Absolute: 0.1 x10E3/uL (ref 0.0–0.2)
Basos: 1 %
EOS (ABSOLUTE): 0.2 x10E3/uL (ref 0.0–0.4)
Eos: 3 %
Hematocrit: 32.8 % — ABNORMAL LOW (ref 37.5–51.0)
Hemoglobin: 10.8 g/dL — ABNORMAL LOW (ref 13.0–17.7)
Immature Grans (Abs): 0.1 x10E3/uL (ref 0.0–0.1)
Immature Granulocytes: 1 %
Lymphocytes Absolute: 2.1 x10E3/uL (ref 0.7–3.1)
Lymphs: 22 %
MCH: 33.3 pg — ABNORMAL HIGH (ref 26.6–33.0)
MCHC: 32.9 g/dL (ref 31.5–35.7)
MCV: 101 fL — ABNORMAL HIGH (ref 79–97)
Monocytes Absolute: 1.2 x10E3/uL — ABNORMAL HIGH (ref 0.1–0.9)
Monocytes: 12 %
Neutrophils Absolute: 6 x10E3/uL (ref 1.4–7.0)
Neutrophils: 61 %
Platelets: 283 x10E3/uL (ref 150–450)
RBC: 3.24 x10E6/uL — ABNORMAL LOW (ref 4.14–5.80)
RDW: 12.2 % (ref 11.6–15.4)
WBC: 9.6 x10E3/uL (ref 3.4–10.8)

## 2024-07-11 LAB — SEDIMENTATION RATE: Sed Rate: 6 mm/h (ref 0–30)

## 2024-07-11 NOTE — Telephone Encounter (Signed)
 Called and spoke with pts daughter Darice in dpr, she confirmed understanding

## 2024-07-30 ENCOUNTER — Telehealth: Payer: Self-pay

## 2024-07-30 DIAGNOSIS — I5042 Chronic combined systolic (congestive) and diastolic (congestive) heart failure: Secondary | ICD-10-CM

## 2024-07-30 DIAGNOSIS — I482 Chronic atrial fibrillation, unspecified: Secondary | ICD-10-CM

## 2024-07-31 ENCOUNTER — Telehealth: Payer: Self-pay

## 2024-07-31 NOTE — Progress Notes (Unsigned)
 Complex Care Management Note Care Guide Note  07/31/2024 Name: Max Cole MRN: [id number] DOB: [birth year]   Complex Care Management Outreach Attempts: An unsuccessful telephone outreach was attempted today to offer the patient information about available complex care management services.  Follow Up Plan:  Additional outreach attempts will be made to offer the patient complex care management information and services.   Encounter Outcome:  No Answer-Left voicemail  Leotis Rase [facility], [facility] Guide  Direct Dial: [phone]  Fax [phone]

## 2024-08-01 LAB — CUP PACEART REMOTE DEVICE CHECK
Battery Remaining Longevity: 126 mo
Battery Remaining Percentage: 100 %
Brady Statistic RA Percent Paced: 28 %
Brady Statistic RV Percent Paced: 97 %
Date Time Interrogation Session: 20260205003100
Implantable Lead Connection Status: 753985
Implantable Lead Connection Status: 753985
Implantable Lead Connection Status: 753985
Implantable Lead Implant Date: 20170106
Implantable Lead Implant Date: 20170106
Implantable Lead Implant Date: 20170106
Implantable Lead Location: 753858
Implantable Lead Location: 753859
Implantable Lead Location: 753860
Implantable Lead Model: 4677
Implantable Lead Model: 7741
Implantable Lead Model: 7742
Implantable Lead Serial Number: 507382
Implantable Lead Serial Number: 695539
Implantable Lead Serial Number: 724660
Implantable Pulse Generator Implant Date: 20250203
Lead Channel Impedance Value: 599 Ohm
Lead Channel Impedance Value: 632 Ohm
Lead Channel Impedance Value: 670 Ohm
Lead Channel Pacing Threshold Amplitude: 0.8 V
Lead Channel Pacing Threshold Amplitude: 1 V
Lead Channel Pacing Threshold Amplitude: 1.2 V
Lead Channel Pacing Threshold Pulse Width: 0.4 ms
Lead Channel Pacing Threshold Pulse Width: 0.4 ms
Lead Channel Pacing Threshold Pulse Width: 0.6 ms
Lead Channel Setting Pacing Amplitude: 2 V
Lead Channel Setting Pacing Amplitude: 2.5 V
Lead Channel Setting Pacing Amplitude: 2.6 V
Lead Channel Setting Pacing Pulse Width: 0.4 ms
Lead Channel Setting Pacing Pulse Width: 0.6 ms
Lead Channel Setting Sensing Sensitivity: 2.5 mV
Lead Channel Setting Sensing Sensitivity: 2.5 mV
Pulse Gen Serial Number: 800611
Zone Setting Status: 755011

## 2024-08-01 NOTE — Progress Notes (Unsigned)
 Complex Care Management Note Care Guide Note  08/01/2024 Name: Max Cole MRN: [id number] DOB: 19-Dec-1927   Complex Care Management Outreach Attempts: A second unsuccessful outreach was attempted today to offer the patient with information about available complex care management services.  Follow Up Plan:  Additional outreach attempts will be made to offer the patient complex care management information and services.   Encounter Outcome:  No Answer-Left voicemail  Leotis Rase [facility], [facility] Guide  Direct Dial: [phone]  Fax [phone]

## 2024-08-02 NOTE — Progress Notes (Signed)
 Complex Care Management Note  Care Guide Note 08/02/2024 Name: Max Cole MRN: [id number] DOB: 1928/04/06  Max Cole is a 89 y.o. year old male who sees Sheryle Carwin, MD for primary care. I reached out to Cheryl ONEIDA Delton by phone today to offer complex care management services.  [name] was given information about Complex Care Management services today including:   The Complex Care Management services include support from the care team which includes your Nurse Care Manager, Clinical Social Worker, or Pharmacist.  The Complex Care Management team is here to help remove barriers to the health concerns and goals most important to you. Complex Care Management services are voluntary, and the patient may decline or stop services at any time by request to their care team member.   Complex Care Management Consent Status: Patient agreed to services and verbal consent obtained.   Follow up plan:  Telephone appointment with complex care management team member scheduled for:  08/20/24 @ 3 pm  Encounter Outcome:  Patient Scheduled  Leotis Rase [facility], [name]  Direct Dial: [phone]  Fax [phone]

## 2024-08-20 ENCOUNTER — Telehealth

## 2024-09-02 ENCOUNTER — Ambulatory Visit: Payer: Medicare HMO

## 2024-12-02 ENCOUNTER — Ambulatory Visit: Payer: Medicare HMO

## 2024-12-10 ENCOUNTER — Ambulatory Visit: Admitting: Internal Medicine

## 2025-03-04 ENCOUNTER — Ambulatory Visit: Payer: Medicare HMO

## 2025-06-02 ENCOUNTER — Ambulatory Visit: Payer: Medicare HMO

## 2025-09-01 ENCOUNTER — Ambulatory Visit: Payer: Medicare HMO
